# Patient Record
Sex: Male | Born: 1935 | Race: White | Hispanic: No | Marital: Married | Smoking: Former smoker
Health system: Southern US, Community
[De-identification: ages and names within clinical notes are randomized; demographics above are authoritative.]

## PROBLEM LIST (undated history)

## (undated) DIAGNOSIS — I10 Essential (primary) hypertension: Secondary | ICD-10-CM

## (undated) DIAGNOSIS — K635 Polyp of colon: Secondary | ICD-10-CM

## (undated) DIAGNOSIS — K449 Diaphragmatic hernia without obstruction or gangrene: Secondary | ICD-10-CM

## (undated) DIAGNOSIS — R0602 Shortness of breath: Secondary | ICD-10-CM

## (undated) DIAGNOSIS — N189 Chronic kidney disease, unspecified: Secondary | ICD-10-CM

## (undated) DIAGNOSIS — F32A Depression, unspecified: Secondary | ICD-10-CM

## (undated) DIAGNOSIS — D649 Anemia, unspecified: Secondary | ICD-10-CM

## (undated) DIAGNOSIS — F329 Major depressive disorder, single episode, unspecified: Secondary | ICD-10-CM

## (undated) DIAGNOSIS — I209 Angina pectoris, unspecified: Secondary | ICD-10-CM

## (undated) DIAGNOSIS — Z9989 Dependence on other enabling machines and devices: Secondary | ICD-10-CM

## (undated) DIAGNOSIS — I509 Heart failure, unspecified: Secondary | ICD-10-CM

## (undated) DIAGNOSIS — I4891 Unspecified atrial fibrillation: Secondary | ICD-10-CM

## (undated) DIAGNOSIS — J9611 Chronic respiratory failure with hypoxia: Secondary | ICD-10-CM

## (undated) DIAGNOSIS — M1990 Unspecified osteoarthritis, unspecified site: Secondary | ICD-10-CM

## (undated) DIAGNOSIS — M545 Low back pain, unspecified: Secondary | ICD-10-CM

## (undated) DIAGNOSIS — F0280 Dementia in other diseases classified elsewhere without behavioral disturbance: Secondary | ICD-10-CM

## (undated) DIAGNOSIS — I219 Acute myocardial infarction, unspecified: Secondary | ICD-10-CM

## (undated) DIAGNOSIS — R413 Other amnesia: Principal | ICD-10-CM

## (undated) DIAGNOSIS — K922 Gastrointestinal hemorrhage, unspecified: Secondary | ICD-10-CM

## (undated) DIAGNOSIS — K5790 Diverticulosis of intestine, part unspecified, without perforation or abscess without bleeding: Secondary | ICD-10-CM

## (undated) DIAGNOSIS — D126 Benign neoplasm of colon, unspecified: Secondary | ICD-10-CM

## (undated) DIAGNOSIS — I499 Cardiac arrhythmia, unspecified: Secondary | ICD-10-CM

## (undated) DIAGNOSIS — G4733 Obstructive sleep apnea (adult) (pediatric): Secondary | ICD-10-CM

## (undated) DIAGNOSIS — K649 Unspecified hemorrhoids: Secondary | ICD-10-CM

## (undated) DIAGNOSIS — K219 Gastro-esophageal reflux disease without esophagitis: Secondary | ICD-10-CM

## (undated) DIAGNOSIS — G309 Alzheimer's disease, unspecified: Secondary | ICD-10-CM

## (undated) DIAGNOSIS — I739 Peripheral vascular disease, unspecified: Secondary | ICD-10-CM

## (undated) DIAGNOSIS — K317 Polyp of stomach and duodenum: Secondary | ICD-10-CM

## (undated) DIAGNOSIS — J449 Chronic obstructive pulmonary disease, unspecified: Secondary | ICD-10-CM

## (undated) DIAGNOSIS — J42 Unspecified chronic bronchitis: Secondary | ICD-10-CM

## (undated) DIAGNOSIS — E119 Type 2 diabetes mellitus without complications: Secondary | ICD-10-CM

## (undated) DIAGNOSIS — N289 Disorder of kidney and ureter, unspecified: Secondary | ICD-10-CM

## (undated) DIAGNOSIS — C4490 Unspecified malignant neoplasm of skin, unspecified: Secondary | ICD-10-CM

## (undated) DIAGNOSIS — E669 Obesity, unspecified: Secondary | ICD-10-CM

## (undated) DIAGNOSIS — J45909 Unspecified asthma, uncomplicated: Secondary | ICD-10-CM

## (undated) DIAGNOSIS — J189 Pneumonia, unspecified organism: Secondary | ICD-10-CM

## (undated) DIAGNOSIS — K5520 Angiodysplasia of colon without hemorrhage: Secondary | ICD-10-CM

## (undated) DIAGNOSIS — E7800 Pure hypercholesterolemia, unspecified: Secondary | ICD-10-CM

## (undated) DIAGNOSIS — G8929 Other chronic pain: Secondary | ICD-10-CM

## (undated) DIAGNOSIS — R0989 Other specified symptoms and signs involving the circulatory and respiratory systems: Secondary | ICD-10-CM

## (undated) HISTORY — DX: Polyp of stomach and duodenum: K31.7

## (undated) HISTORY — DX: Chronic obstructive pulmonary disease, unspecified: J44.9

## (undated) HISTORY — DX: Dementia in other diseases classified elsewhere, unspecified severity, without behavioral disturbance, psychotic disturbance, mood disturbance, and anxiety: F02.80

## (undated) HISTORY — DX: Chronic respiratory failure with hypoxia: J96.11

## (undated) HISTORY — DX: Gastrointestinal hemorrhage, unspecified: K92.2

## (undated) HISTORY — DX: Other specified symptoms and signs involving the circulatory and respiratory systems: R09.89

## (undated) HISTORY — PX: CATARACT EXTRACTION W/ INTRAOCULAR LENS  IMPLANT, BILATERAL: SHX1307

## (undated) HISTORY — DX: Obesity, unspecified: E66.9

## (undated) HISTORY — DX: Diverticulosis of intestine, part unspecified, without perforation or abscess without bleeding: K57.90

## (undated) HISTORY — DX: Disorder of kidney and ureter, unspecified: N28.9

## (undated) HISTORY — DX: Alzheimer's disease, unspecified: G30.9

## (undated) HISTORY — PX: COLONOSCOPY W/ BIOPSIES AND POLYPECTOMY: SHX1376

## (undated) HISTORY — PX: LAPAROSCOPIC CHOLECYSTECTOMY: SUR755

## (undated) HISTORY — DX: Chronic kidney disease, unspecified: N18.9

## (undated) HISTORY — DX: Benign neoplasm of colon, unspecified: D12.6

## (undated) HISTORY — DX: Polyp of colon: K63.5

## (undated) HISTORY — DX: Other amnesia: R41.3

## (undated) HISTORY — DX: Unspecified hemorrhoids: K64.9

## (undated) HISTORY — DX: Gastro-esophageal reflux disease without esophagitis: K21.9

## (undated) HISTORY — PX: HAND SURGERY: SHX662

## (undated) HISTORY — DX: Unspecified atrial fibrillation: I48.91

## (undated) HISTORY — DX: Unspecified asthma, uncomplicated: J45.909

## (undated) HISTORY — DX: Diaphragmatic hernia without obstruction or gangrene: K44.9

---
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
## 1995-05-26 HISTORY — PX: CORONARY ARTERY BYPASS GRAFT: SHX141

## 1998-09-26 ENCOUNTER — Encounter: Payer: Self-pay | Admitting: Cardiology

## 1998-09-26 ENCOUNTER — Inpatient Hospital Stay (HOSPITAL_COMMUNITY): Admission: EM | Admit: 1998-09-26 | Discharge: 1998-09-28 | Payer: Self-pay | Admitting: Emergency Medicine

## 1998-09-27 ENCOUNTER — Encounter: Payer: Self-pay | Admitting: Cardiology

## 1998-10-08 ENCOUNTER — Other Ambulatory Visit: Admission: RE | Admit: 1998-10-08 | Discharge: 1998-10-08 | Payer: Self-pay | Admitting: Internal Medicine

## 1999-08-01 ENCOUNTER — Observation Stay (HOSPITAL_COMMUNITY): Admission: EM | Admit: 1999-08-01 | Discharge: 1999-08-02 | Payer: Self-pay | Admitting: Emergency Medicine

## 1999-08-01 ENCOUNTER — Encounter: Payer: Self-pay | Admitting: Cardiology

## 2002-07-24 ENCOUNTER — Emergency Department (HOSPITAL_COMMUNITY): Admission: EM | Admit: 2002-07-24 | Discharge: 2002-07-24 | Payer: Self-pay | Admitting: *Deleted

## 2002-07-24 ENCOUNTER — Encounter: Payer: Self-pay | Admitting: Emergency Medicine

## 2002-08-01 ENCOUNTER — Encounter: Admission: RE | Admit: 2002-08-01 | Discharge: 2002-10-30 | Payer: Self-pay

## 2002-08-08 ENCOUNTER — Encounter: Payer: Self-pay | Admitting: Neurosurgery

## 2002-08-08 ENCOUNTER — Ambulatory Visit (HOSPITAL_COMMUNITY): Admission: RE | Admit: 2002-08-08 | Discharge: 2002-08-08 | Payer: Self-pay | Admitting: Neurosurgery

## 2003-03-09 ENCOUNTER — Inpatient Hospital Stay (HOSPITAL_COMMUNITY): Admission: EM | Admit: 2003-03-09 | Discharge: 2003-03-14 | Payer: Self-pay | Admitting: Emergency Medicine

## 2003-07-11 ENCOUNTER — Emergency Department (HOSPITAL_COMMUNITY): Admission: AD | Admit: 2003-07-11 | Discharge: 2003-07-11 | Payer: Self-pay | Admitting: Emergency Medicine

## 2004-06-02 ENCOUNTER — Inpatient Hospital Stay (HOSPITAL_COMMUNITY): Admission: EM | Admit: 2004-06-02 | Discharge: 2004-06-06 | Payer: Self-pay | Admitting: Emergency Medicine

## 2005-11-24 ENCOUNTER — Ambulatory Visit: Payer: Self-pay | Admitting: Internal Medicine

## 2005-11-27 ENCOUNTER — Ambulatory Visit: Payer: Self-pay | Admitting: Gastroenterology

## 2005-12-07 ENCOUNTER — Encounter (INDEPENDENT_AMBULATORY_CARE_PROVIDER_SITE_OTHER): Payer: Self-pay | Admitting: Specialist

## 2005-12-07 ENCOUNTER — Ambulatory Visit: Payer: Self-pay | Admitting: Internal Medicine

## 2006-04-08 ENCOUNTER — Ambulatory Visit: Admission: RE | Admit: 2006-04-08 | Discharge: 2006-04-08 | Payer: Self-pay | Admitting: Family Medicine

## 2006-06-24 ENCOUNTER — Ambulatory Visit: Payer: Self-pay | Admitting: Internal Medicine

## 2006-07-20 ENCOUNTER — Observation Stay (HOSPITAL_COMMUNITY): Admission: EM | Admit: 2006-07-20 | Discharge: 2006-07-22 | Payer: Self-pay | Admitting: Emergency Medicine

## 2006-07-27 ENCOUNTER — Ambulatory Visit: Payer: Self-pay | Admitting: Internal Medicine

## 2006-11-19 ENCOUNTER — Encounter: Admission: RE | Admit: 2006-11-19 | Discharge: 2006-11-19 | Payer: Self-pay | Admitting: Family Medicine

## 2006-11-19 ENCOUNTER — Encounter (INDEPENDENT_AMBULATORY_CARE_PROVIDER_SITE_OTHER): Payer: Self-pay | Admitting: *Deleted

## 2006-12-30 ENCOUNTER — Inpatient Hospital Stay (HOSPITAL_COMMUNITY): Admission: EM | Admit: 2006-12-30 | Discharge: 2006-12-31 | Payer: Self-pay | Admitting: Emergency Medicine

## 2006-12-30 ENCOUNTER — Encounter (INDEPENDENT_AMBULATORY_CARE_PROVIDER_SITE_OTHER): Payer: Self-pay | Admitting: Internal Medicine

## 2006-12-30 ENCOUNTER — Ambulatory Visit: Payer: Self-pay | Admitting: Vascular Surgery

## 2007-08-20 ENCOUNTER — Inpatient Hospital Stay (HOSPITAL_COMMUNITY): Admission: EM | Admit: 2007-08-20 | Discharge: 2007-08-22 | Payer: Self-pay | Admitting: Emergency Medicine

## 2007-09-24 ENCOUNTER — Emergency Department (HOSPITAL_COMMUNITY): Admission: EM | Admit: 2007-09-24 | Discharge: 2007-09-24 | Payer: Self-pay | Admitting: Emergency Medicine

## 2008-01-12 ENCOUNTER — Encounter: Payer: Self-pay | Admitting: Internal Medicine

## 2008-05-05 ENCOUNTER — Inpatient Hospital Stay (HOSPITAL_COMMUNITY): Admission: EM | Admit: 2008-05-05 | Discharge: 2008-05-20 | Payer: Self-pay | Admitting: Emergency Medicine

## 2008-05-13 ENCOUNTER — Encounter (INDEPENDENT_AMBULATORY_CARE_PROVIDER_SITE_OTHER): Payer: Self-pay | Admitting: *Deleted

## 2008-05-16 ENCOUNTER — Ambulatory Visit: Payer: Self-pay | Admitting: Vascular Surgery

## 2008-05-16 ENCOUNTER — Ambulatory Visit: Payer: Self-pay | Admitting: Gastroenterology

## 2008-05-16 ENCOUNTER — Encounter (INDEPENDENT_AMBULATORY_CARE_PROVIDER_SITE_OTHER): Payer: Self-pay | Admitting: *Deleted

## 2008-05-16 ENCOUNTER — Encounter (INDEPENDENT_AMBULATORY_CARE_PROVIDER_SITE_OTHER): Payer: Self-pay | Admitting: Cardiology

## 2008-05-17 ENCOUNTER — Encounter (INDEPENDENT_AMBULATORY_CARE_PROVIDER_SITE_OTHER): Payer: Self-pay | Admitting: *Deleted

## 2008-05-17 ENCOUNTER — Encounter: Payer: Self-pay | Admitting: Gastroenterology

## 2008-05-18 ENCOUNTER — Encounter (INDEPENDENT_AMBULATORY_CARE_PROVIDER_SITE_OTHER): Payer: Self-pay | Admitting: Cardiology

## 2008-05-19 ENCOUNTER — Encounter: Payer: Self-pay | Admitting: Gastroenterology

## 2008-05-19 ENCOUNTER — Encounter (INDEPENDENT_AMBULATORY_CARE_PROVIDER_SITE_OTHER): Payer: Self-pay | Admitting: *Deleted

## 2008-05-20 ENCOUNTER — Encounter (INDEPENDENT_AMBULATORY_CARE_PROVIDER_SITE_OTHER): Payer: Self-pay | Admitting: *Deleted

## 2008-05-21 ENCOUNTER — Telehealth: Payer: Self-pay | Admitting: Internal Medicine

## 2008-05-22 ENCOUNTER — Telehealth: Payer: Self-pay | Admitting: Internal Medicine

## 2008-05-25 HISTORY — PX: RENAL ARTERY STENT: SHX2321

## 2008-05-28 ENCOUNTER — Encounter: Payer: Self-pay | Admitting: Nurse Practitioner

## 2008-05-31 ENCOUNTER — Ambulatory Visit: Payer: Self-pay | Admitting: Gastroenterology

## 2008-05-31 DIAGNOSIS — E119 Type 2 diabetes mellitus without complications: Secondary | ICD-10-CM | POA: Insufficient documentation

## 2008-05-31 DIAGNOSIS — K922 Gastrointestinal hemorrhage, unspecified: Secondary | ICD-10-CM | POA: Insufficient documentation

## 2008-05-31 DIAGNOSIS — K5731 Diverticulosis of large intestine without perforation or abscess with bleeding: Secondary | ICD-10-CM

## 2008-06-07 ENCOUNTER — Encounter: Payer: Self-pay | Admitting: Nurse Practitioner

## 2008-06-11 ENCOUNTER — Encounter: Payer: Self-pay | Admitting: Internal Medicine

## 2008-06-13 ENCOUNTER — Encounter: Payer: Self-pay | Admitting: Internal Medicine

## 2008-06-20 ENCOUNTER — Telehealth (INDEPENDENT_AMBULATORY_CARE_PROVIDER_SITE_OTHER): Payer: Self-pay | Admitting: *Deleted

## 2008-06-21 ENCOUNTER — Ambulatory Visit: Payer: Self-pay | Admitting: Internal Medicine

## 2008-06-21 DIAGNOSIS — R195 Other fecal abnormalities: Secondary | ICD-10-CM

## 2008-06-21 DIAGNOSIS — D649 Anemia, unspecified: Secondary | ICD-10-CM | POA: Insufficient documentation

## 2008-06-21 LAB — CONVERTED CEMR LAB
Ferritin: 60.6 ng/mL (ref 22.0–322.0)
HCT: 26.8 % — ABNORMAL LOW (ref 39.0–52.0)
Saturation Ratios: 8.2 % — ABNORMAL LOW (ref 20.0–50.0)
Transferrin: 259.8 mg/dL (ref >=212.0)

## 2008-06-22 ENCOUNTER — Telehealth: Payer: Self-pay | Admitting: Internal Medicine

## 2008-06-22 ENCOUNTER — Observation Stay (HOSPITAL_COMMUNITY): Admission: AD | Admit: 2008-06-22 | Discharge: 2008-06-22 | Payer: Self-pay | Admitting: Internal Medicine

## 2008-06-25 ENCOUNTER — Telehealth: Payer: Self-pay | Admitting: Nurse Practitioner

## 2008-06-25 ENCOUNTER — Telehealth (INDEPENDENT_AMBULATORY_CARE_PROVIDER_SITE_OTHER): Payer: Self-pay | Admitting: *Deleted

## 2008-07-02 ENCOUNTER — Encounter: Payer: Self-pay | Admitting: Nurse Practitioner

## 2008-07-04 ENCOUNTER — Telehealth: Payer: Self-pay | Admitting: Internal Medicine

## 2008-07-06 ENCOUNTER — Encounter (HOSPITAL_COMMUNITY): Admission: RE | Admit: 2008-07-06 | Discharge: 2008-10-04 | Payer: Self-pay | Admitting: Nephrology

## 2008-07-24 ENCOUNTER — Ambulatory Visit: Payer: Self-pay | Admitting: Internal Medicine

## 2008-08-05 ENCOUNTER — Inpatient Hospital Stay (HOSPITAL_COMMUNITY): Admission: EM | Admit: 2008-08-05 | Discharge: 2008-08-12 | Payer: Self-pay | Admitting: Emergency Medicine

## 2008-08-06 LAB — CONVERTED CEMR LAB
Lymphocytes Relative: 30.3 % (ref 12.0–46.0)
Monocytes Absolute: 0.6 10*3/uL (ref 0.1–1.0)
Monocytes Relative: 8.8 % (ref 3.0–12.0)
Platelets: 202 10*3/uL (ref 150–400)
RDW: 19.5 % — ABNORMAL HIGH (ref 11.5–14.6)

## 2008-08-10 ENCOUNTER — Encounter (INDEPENDENT_AMBULATORY_CARE_PROVIDER_SITE_OTHER): Payer: Self-pay | Admitting: Surgery

## 2008-11-29 ENCOUNTER — Encounter (INDEPENDENT_AMBULATORY_CARE_PROVIDER_SITE_OTHER): Payer: Self-pay | Admitting: *Deleted

## 2009-01-22 ENCOUNTER — Inpatient Hospital Stay (HOSPITAL_COMMUNITY): Admission: RE | Admit: 2009-01-22 | Discharge: 2009-01-23 | Payer: Self-pay | Admitting: Cardiovascular Disease

## 2009-03-11 ENCOUNTER — Ambulatory Visit (HOSPITAL_COMMUNITY): Admission: RE | Admit: 2009-03-11 | Discharge: 2009-03-12 | Payer: Self-pay | Admitting: Cardiovascular Disease

## 2009-04-22 ENCOUNTER — Ambulatory Visit (HOSPITAL_COMMUNITY): Admission: RE | Admit: 2009-04-22 | Discharge: 2009-04-22 | Payer: Self-pay | Admitting: Cardiovascular Disease

## 2009-07-01 ENCOUNTER — Telehealth: Payer: Self-pay | Admitting: Internal Medicine

## 2009-09-04 ENCOUNTER — Inpatient Hospital Stay (HOSPITAL_COMMUNITY): Admission: EM | Admit: 2009-09-04 | Discharge: 2009-09-07 | Payer: Self-pay | Admitting: Emergency Medicine

## 2010-01-09 ENCOUNTER — Ambulatory Visit: Payer: Self-pay | Admitting: Pulmonary Disease

## 2010-01-09 ENCOUNTER — Inpatient Hospital Stay (HOSPITAL_COMMUNITY): Admission: EM | Admit: 2010-01-09 | Discharge: 2010-01-14 | Payer: Self-pay | Admitting: Emergency Medicine

## 2010-01-10 ENCOUNTER — Encounter (INDEPENDENT_AMBULATORY_CARE_PROVIDER_SITE_OTHER): Payer: Self-pay | Admitting: Pulmonary Disease

## 2010-01-14 ENCOUNTER — Telehealth: Payer: Self-pay | Admitting: Internal Medicine

## 2010-01-23 ENCOUNTER — Ambulatory Visit: Payer: Self-pay | Admitting: Internal Medicine

## 2010-01-23 DIAGNOSIS — G4733 Obstructive sleep apnea (adult) (pediatric): Secondary | ICD-10-CM

## 2010-02-02 ENCOUNTER — Encounter: Payer: Self-pay | Admitting: Internal Medicine

## 2010-02-11 ENCOUNTER — Ambulatory Visit: Payer: Self-pay | Admitting: Internal Medicine

## 2010-02-12 ENCOUNTER — Telehealth: Payer: Self-pay | Admitting: Internal Medicine

## 2010-03-05 ENCOUNTER — Inpatient Hospital Stay (HOSPITAL_COMMUNITY): Admission: EM | Admit: 2010-03-05 | Discharge: 2010-03-08 | Payer: Self-pay | Admitting: Emergency Medicine

## 2010-03-06 ENCOUNTER — Encounter (INDEPENDENT_AMBULATORY_CARE_PROVIDER_SITE_OTHER): Payer: Self-pay | Admitting: Internal Medicine

## 2010-03-26 ENCOUNTER — Telehealth: Payer: Self-pay | Admitting: Internal Medicine

## 2010-05-25 DIAGNOSIS — K317 Polyp of stomach and duodenum: Secondary | ICD-10-CM

## 2010-05-25 HISTORY — DX: Polyp of stomach and duodenum: K31.7

## 2010-06-03 ENCOUNTER — Telehealth (INDEPENDENT_AMBULATORY_CARE_PROVIDER_SITE_OTHER): Payer: Self-pay | Admitting: *Deleted

## 2010-06-05 ENCOUNTER — Ambulatory Visit
Admission: RE | Admit: 2010-06-05 | Discharge: 2010-06-05 | Payer: Self-pay | Source: Home / Self Care | Attending: Internal Medicine | Admitting: Internal Medicine

## 2010-06-06 ENCOUNTER — Ambulatory Visit: Payer: Self-pay | Admitting: Cardiology

## 2010-06-06 ENCOUNTER — Telehealth: Payer: Self-pay | Admitting: Internal Medicine

## 2010-06-09 ENCOUNTER — Telehealth (INDEPENDENT_AMBULATORY_CARE_PROVIDER_SITE_OTHER): Payer: Self-pay | Admitting: *Deleted

## 2010-06-15 ENCOUNTER — Encounter: Payer: Self-pay | Admitting: Cardiology

## 2010-06-26 NOTE — Progress Notes (Signed)
Summary: nos appt  Phone Note Call from Patient   Caller: [email or web address]  Call For: young Summary of Call: In ref to nos from 11/1, pt states he will call to rsc. Initial call taken by: Darletta Moll,  March 26, 2010 10:04 AM

## 2010-06-26 NOTE — Assessment & Plan Note (Signed)
Summary: HFU-RESP FAILURE PER PETE///kp   Primary Provider/Referring Provider:  [name], MD  CC:  [facility]  History of Present Illness: DATE:07/27/2006                         1. Asthmatic bronchitis. 2. Exogenous obesity. 3. Esophageal reflux. 4. Coronary disease/bypass graft. 5. Hypertension. 6. Diabetes. 7. Alzheimer's.  HISTORY:  He was in the hospital at the end of February to rule out MI with enzymes negative and a negative stress test.  Chest pain was thought to be related to reflux and omeprazole was increased to b.i.d. His wife had called in mid February complaining of thrush and saying Advair was not helping.  We had substituted Spiriva and given a single dose of Diflucan at that time.  He ran out of the Spiriva but felt that definitely helped.  Throat has been better.  Chest x-ray report from the hospital on July 01, 2006, described mild cardiomegaly without acute disease.  Lungs were clear.  Postoperative sternotomy changes were seen. Blood work was significant for a BUN of 41 and a creatinine of 2.17, indicating renal insufficiency in the context of atherosclerosis, hypertension, and diabetes.    January 23, 2010- Chronic resp failure, COPD, Renal insuf, .Marland Kitchen...wife here, OSA Recent hosp 8/18-23/11,[facility] Acute/ Chronic resp failure. DC'd on Bipap auto but needs settings/ documentation. Was using CPAP/ SMS at home. Was discharged on BIPAP - comfortable in hosp, but set too high at home, with O2 continuous at 3 L. Home O2 sat 96 at rest on room air. 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 Feet swelling hurts. Dr Tresa Endo dxd OSA w/ NPSG in April 2011 at [facility]. He didn't see this pt at last hosp stay. ABG at hosp on 50%- pH 7.28, PCO2 58.5, PO2 178 on 12/1809.    Preventive Screening-Counseling & Management  Alcohol-Tobacco     Smoking Status: quit     Year Quit: 1992     Pack years: 50 years 2ppd     Tobacco  Counseling: not to resume use of tobacco products  Current Medications (verified): 1)  Symbicort 160-4.5 Mcg/act Aero (Budesonide-Formoterol Fumarate) .... 2 Puffs Two Times A Day 2)  Colchicine 0.6 Mg Tabs (Colchicine) .... Every Other Day 3)  Protonix 40 Mg Tbec (Pantoprazole Sodium) .... Once Daily 4)  Zetia 10 Mg Tabs (Ezetimibe) .... Once Daily 5)  Folic Acid 400 Mcg Tabs (Folic Acid) .... Once Daily 6)  Multivitamins  Tabs (Multiple Vitamin) .... Once Daily 7)  Lexapro 10 Mg Tabs (Escitalopram Oxalate) .... Once Daily 8)  Glipizide 5 Mg Tabs (Glipizide) .... Take 2 Tabs in Am 9)  Lipitor 40 Mg Tabs (Atorvastatin Calcium) .... At Bedtime 10)  Gabapentin 100 Mg Caps (Gabapentin) .... At Bedtime 11)  Furosemide 80 Mg Tabs (Furosemide) .... Take 2 By Mouth Two Times A Day 12)  Tylenol Ex St Arthritis Pain 500 Mg Tabs (Acetaminophen) .... Take 2 Tablets By Mouth Two Times A Day 13)  Ferrous Sulfate 325 (65 Fe) Mg  Tabs (Ferrous Sulfate) .... Take 1 Tablet By Mouth Three Times A Day 14)  Prednisone 5 Mg Tabs (Prednisone) .... Take 1 By Mouth Once Daily 15)  Nitrostat 0.4 Mg Subl (Nitroglycerin) .... As Needed 16)  Niacor 500 Mg Tabs (Niacin) .... Take 1 By Mouth Two Times A Day 17)  Lantus 100 Unit/ml Soln (Insulin Glargine) .... Inject 20 Units At  Bedtime 18)  Amlodipine Besylate 10 Mg Tabs (Amlodipine Besylate) .... Take 1 By Mouth Once Daily 19)  Aspirin 325 Mg Tabs (Aspirin) .... Take 1 By Mouth Once Daily 20)  Albuterol Sulfate (2.5 Mg/51ml) 0.083% Nebu (Albuterol Sulfate) .Marland Kitchen.. 1 Vial Four Times A Day 21)  Bipap .... Auto Set Sms 22)  Oxygen .Marland Kitchen.. 3l/m 24 Hours  Allergies: 1)  ! Sulfa 2)  ! Heparin 3)  ! * Ertapenem 4)  ! Morphine 5)  ! * Hydromorphone 6)  ! * Metoprolol 7)  ! * Donepezil 8)  ! * Hydralazine 9)  ! * Clopidogrel 10)  ! * Cefuroxime  Past History:  Past Surgical History: Last updated: 05/31/2008 CABG Hand surgery  Family History: Last updated:  05/31/2008 Melenoma: Mother Lung Cancer: Brother Family History of Diabetes:  Family History of Heart Disease:  Family History of Stomach Cancer:GF  Social History: Last updated: 05/31/2008 Occupation: Retired Patient is a former smoker.  Alcohol Use - no Daily Caffeine Use Illicit Drug Use - no  Risk Factors: Smoking Status: quit (01/23/2010)  Past Medical History: Adenomatous Colon Polyps Diverticulosis GERD Hiatal hernia Alzheimers Gout Renal insuffiency Diabetes Asthma/Bronchitis COPD Chronic hypoxic respiratory failure obstructive sleep apnea  Social History: Pack years:  50 years 2ppd  Review of Systems      See HPI       The patient complains of irregular heartbeats and hand/feet swelling.  The patient denies shortness of breath with activity, shortness of breath at rest, productive cough, non-productive cough, coughing up blood, chest pain, acid heartburn, indigestion, loss of appetite, weight change, abdominal pain, difficulty swallowing, sore throat, tooth/dental problems, headaches, nasal congestion/difficulty breathing through nose, sneezing, rash, change in color of mucus, and fever.    Vital Signs:  Patient profile:   77 year old male Height:      69 inches Weight:      254.25 pounds BMI:     37.68 O2 Sat:      97 % on Room air Pulse rate:   62 / minute BP sitting:   122 / 68  (left arm) Cuff size:   regular  Vitals Entered By: Reynaldo Minium CMA (January 23, 2010 1:33 PM)  O2 Flow:  Room air CC: Conservation officer, historic buildings   Physical Exam  Additional Exam:  General: A/Ox3; pleasant and cooperative, NAD, alert and talkative, wheelchair. Supplemental O2 continuous- 97% on 1 L here at rest SKIN: no rash, lesions NODES: no lymphadenopathy HEENT: [place]/AT, EOM- WNL, Conjuctivae- clear, PERRLA, TM-WNL, Nose- clear, Throat- clear and wnl, Mallampati  III NECK: Supple w/ fair ROM, JVD- none obvious (thick neck), normal carotid impulses w/o bruits Thyroid- normal  to palpation CHEST: Clear to P&A, diminshed. No cough, dullness or rales/ wheeze. HEART: RRR, no m/g/r heard ABDOMEN: significantly overweight EAV:WUJW, nl pulses,  2-3+edema feet. synovial thickening on fingers NEURO: Grossly intact to observation      Impression & Recommendations:  Problem # 1:  OBSTRUCTIVE SLEEP APNEA (ICD-327.23)  Obstructive sleep apnea was Dx'd by Dr Tresa Endo and they are pending f/u with him. Current BiPAP auto set up at hosp discharge is ranging too high, but CPAP was well tolerated til he got sick. I will set him up with CPAP autotitration for pressure recheck, with results and further decisions from Dr Tresa Endo unless he wants me to manage this problem. Weight loss would help. Patient had been compliant and well controlled before decompensating acutely.  Problem # 2:  ACUTE AND CHRONIC  RESPIRATORY FAILURE (ICD-518.84)  He is describing good room air sats at rest at home, and may not need the 3 liters he is now assigned. Has been on O2 through [facility] for past 1.5 years. I can help with this. We will continue oxygen at night and check room air sats at home for goal 90-94%  Medications Added to Medication List This Visit: 1)  Symbicort 160-4.5 Mcg/act Aero (Budesonide-formoterol fumarate) .... 2 puffs two times a day 2)  Furosemide 80 Mg Tabs (Furosemide) .... Take 2 by mouth two times a day 3)  Prednisone 5 Mg Tabs (Prednisone) .... Take 1 by mouth once daily 4)  Nitrostat 0.4 Mg Subl (Nitroglycerin) .... As needed 5)  Niacor 500 Mg Tabs (Niacin) .... Take 1 by mouth two times a day 6)  Lantus 100 Unit/ml Soln (Insulin glargine) .... Inject 20 units at bedtime 7)  Amlodipine Besylate 10 Mg Tabs (Amlodipine besylate) .... Take 1 by mouth once daily 8)  Aspirin 325 Mg Tabs (Aspirin) .... Take 1 by mouth once daily 9)  Albuterol Sulfate (2.5 Mg/68ml) 0.083% Nebu (Albuterol sulfate) .Marland Kitchen.. 1 vial four times a day 10)  Bipap  .... Auto set sms 11)  Oxygen  .Marland Kitchen.. 3l/m 24  hours 12)  Oxygen [facility]  .Marland Kitchen.. 2l/m 24 hours  goal sat 90-94%  Other Orders: Est. Patient Level IV (16109) Admin 1st Vaccine (60454) Flu Vaccine 22yrs + (09811) DME Referral (DME)  Patient Instructions: 1)  Please schedule a follow-up appointment in 2 months. 2)  CC Dr Tresa Endo 3)  Patsy Lager discuss a demand regulator for your portable oxygen 4)  I suggest we set it at 2 for now, but adjust it during the day as needed to hold your oxygen saturation begtween 90-94%. 5)  We will have SMS change your machine back to CPAP and will autotitrate it for a week to get a pressure record. They will send that to Dr Tresa Endo and he will decide how to manage your CPAP. 6)  Flu vax   Flu Vaccine Consent Questions     Do you have a history of severe allergic reactions to this vaccine? no    Any prior history of allergic reactions to egg and/or gelatin? no    Do you have a sensitivity to the preservative Thimersol? no    Do you have a past history of Guillan-Barre Syndrome? no    Do you currently have an acute febrile illness? no    Have you ever had a severe reaction to latex? no    Vaccine information given and explained to patient? yes    Are you currently pregnant? no    Lot Number:[id number]   Exp Date:11/22/2010   Site Given  Left Deltoid IMlbflu   Carver Fila  January 23, 2010 2:47 PM

## 2010-06-26 NOTE — Progress Notes (Signed)
Summary: Autotitrated to CPAP 13  Phone Note Other Incoming   Summary of Call: PAP autotitration SMS. Good control AHI 0.8 onCPAP 13. Will change to CPAP 13 for resp failure.    New/Updated Medications: * CPAP 13 SMS w/ O2 2 L/M

## 2010-06-26 NOTE — Miscellaneous (Signed)
Summary: Face to Face Encounter / [facility]  Face to Face Encounter / [facility]   Imported By: Lennie Odor 02/17/2010 12:26:38  _____________________________________________________________________  External Attachment:    Type:   Image     Comment:   External Document

## 2010-06-26 NOTE — Progress Notes (Signed)
Summary: bipap  Phone Note Call from Patient   Caller: [email or web address]  Call For: young Summary of Call: pete our NP from the hosp called her to get bipap set up for this pt for chronic resp failure she needs abg,documentation failure and a dx code she also needs settings for the bipap she will go ahead and set the pt up on bipap auto per pete's request but does need all the qualifying thigs for insurance  Initial call taken by: Oneita Jolly,  January 14, 2010 5:02 PM

## 2010-06-26 NOTE — Progress Notes (Signed)
Summary: pre-cert for CT  Phone Note Call from Patient   Caller: spouse/[name] Call For: Dr. Maple Hudson Summary of Call: patient phoned and stated that her husband Starsky is scheduled for a CT she just wants to know if this needs prior auth and if so did we obatian that. She can be reached at [phone] you can leave a message if she doesnt answer Initial call taken by: Vedia Coffer,  June 06, 2010 10:21 AM  Follow-up for Phone Call        I advsied this has been done when the scan was scheduled. Carron Curie CMA  June 06, 2010 10:26 AM

## 2010-06-26 NOTE — Miscellaneous (Signed)
Summary: Certification & Plan of Care / [facility]  Certification & Plan of Care / [facility]   Imported By: Lennie Odor 02/17/2010 13:48:12  _____________________________________________________________________  External Attachment:    Type:   Image     Comment:   External Document

## 2010-06-26 NOTE — Assessment & Plan Note (Signed)
Summary: per pt/katie/mhh   Primary Provider/Referring Provider:  [name], MD  CC:  Acute visit-cough x 7 weeks productive green and yellow in color., SOB with activity, and wheezing.Marland Kitchen  History of Present Illness:  January 23, 2010- Chronic resp failure, COPD, Renal insuf, .Marland Kitchen...wife here, OSA Recent hosp 8/18-23/11,[facility] Acute/ Chronic resp failure. DC'd on Bipap auto but needs settings/ documentation. Was using CPAP/ SMS at home. Was discharged on BIPAP - comfortable in hosp, but set too high at home, with O2 continuous at 3 L. Home O2 sat 96 at rest on room air. 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 Feet swelling hurts. Dr Tresa Endo dxd OSA w/ NPSG in April 2011 at [facility]. He didn't see this pt at last hosp stay. ABG at hosp on 50%- pH 7.28, PCO2 58.5, PO2 178 on 12/1809.  June 05, 2010- Chronic resp failure, COPD, Renal insuf, OSA.... daughter here Nurse-CC: Acute visit-cough x 7 weeks productive green and yellow in color., SOB with activity, wheezing. CPAP adjusted to 13 w/ good compliance and control. Complains now of variable cough, green/ yellow, wheeze, some chill and sweat x 7 weeks. Dr Jeannetta Nap did  CXR- told CE, no penumonia. Wears CPAP with O2 only for sleep at 3 L/M. Note he is on lisinopril. Family says he has had xrays and they ask about CT chest "somethings going on". Most of cough is dry.       Preventive Screening-Counseling & Management  Alcohol-Tobacco     Smoking Status: quit     Packs/Day: 2.5     Year Started: 77 years old     Year Quit: 1990  Current Medications (verified): 1)  Symbicort 160-4.5 Mcg/act Aero (Budesonide-Formoterol Fumarate) .... 2 Puffs Two Times A Day 2)  Colcrys 0.6 Mg Tabs (Colchicine) .... Take 1 By Mouth Two Times A Day 3)  Protonix 40 Mg Tbec (Pantoprazole Sodium) .... Once Daily 4)  Zetia 10 Mg Tabs (Ezetimibe) .... Once Daily 5)  Folic Acid 400 Mcg Tabs (Folic Acid) .... Once Daily 6)   Multivitamins  Tabs (Multiple Vitamin) .... Take 1 By Mouth Two Times A Day 7)  Lexapro 10 Mg Tabs (Escitalopram Oxalate) .... Once Daily 8)  Glipizide 5 Mg Tabs (Glipizide) .... Take 2 Tabs in Am 9)  Gabapentin 100 Mg Caps (Gabapentin) .... At Bedtime 10)  Furosemide 80 Mg Tabs (Furosemide) .... Take 2 By Mouth Two Times A Day 11)  Tylenol Ex St Arthritis Pain 500 Mg Tabs (Acetaminophen) .... Take 2 Tablets By Mouth Two Times A Day 12)  Prednisone 5 Mg Tabs (Prednisone) .... Take 1 By Mouth Once Daily 13)  Nitrostat 0.4 Mg Subl (Nitroglycerin) .... As Needed 14)  Niacor 500 Mg Tabs (Niacin) .... Take 1 By Mouth Two Times A Day 15)  Lantus 100 Unit/ml Soln (Insulin Glargine) .... Inject 20 Units At Bedtime 16)  Amlodipine Besylate 10 Mg Tabs (Amlodipine Besylate) .... Take 1 By Mouth Once Daily 17)  Aspirin 325 Mg Tabs (Aspirin) .... Take 1 By Mouth Once Daily 18)  Albuterol Sulfate (2.5 Mg/44ml) 0.083% Nebu (Albuterol Sulfate) .Marland Kitchen.. 1 Vial Four Times A Day 19)  Cpap 13 Sms .... W/ O2 2 L/m 20)  Oxygen [facility] .Marland Kitchen.. 2l/m 24 Hours  Goal Sat 90-94% 21)  Isosorbide Mononitrate Cr 30 Mg Xr24h-Tab (Isosorbide Mononitrate) .... Take 1 By Mouth Once Daily 22)  Lisinopril 10 Mg Tabs (Lisinopril) .... Take 1/2 By Mouth Once Daily 23)  Metolazone  2.5 Mg Tabs (Metolazone) .... Take 1 By Mouth Every Other Day 24)  Cinnamon 500 Mg Tabs (Cinnamon) .... Take 2 By Mouth Once Daily  Allergies (verified): 1)  ! Sulfa 2)  ! Heparin 3)  ! * Ertapenem 4)  ! Morphine 5)  ! * Hydromorphone 6)  ! * Metoprolol 7)  ! * Donepezil 8)  ! * Hydralazine 9)  ! * Clopidogrel 10)  ! * Cefuroxime  Past History:  Past Medical History: Last updated: 01/23/2010 Adenomatous Colon Polyps Diverticulosis GERD Hiatal hernia Alzheimers Gout Renal insuffiency Diabetes Asthma/Bronchitis COPD Chronic hypoxic respiratory failure obstructive sleep apnea  Past Surgical History: Last updated: 05/31/2008 CABG Hand  surgery  Family History: Last updated: 05/31/2008 Melenoma: Mother Lung Cancer: Brother Family History of Diabetes:  Family History of Heart Disease:  Family History of Stomach Cancer:GF  Social History: Last updated: 05/31/2008 Occupation: Retired Patient is a former smoker.  Alcohol Use - no Daily Caffeine Use Illicit Drug Use - no  Risk Factors: Smoking Status: quit (06/05/2010) Packs/Day: 2.5 (06/05/2010)  Social History: Packs/Day:  2.5  Review of Systems      See HPI       The patient complains of shortness of breath with activity and productive cough.  The patient denies shortness of breath at rest, non-productive cough, coughing up blood, chest pain, irregular heartbeats, acid heartburn, indigestion, loss of appetite, weight change, abdominal pain, difficulty swallowing, sore throat, tooth/dental problems, headaches, nasal congestion/difficulty breathing through nose, sneezing, itching, hand/feet swelling, joint stiffness or pain, rash, change in color of mucus, and fever.    Vital Signs:  Patient profile:   77 year old male Height:      69 inches Weight:      255.25 pounds BMI:     37.83 O2 Sat:      92 % on Room air Pulse rate:   59 / minute BP sitting:   138 / 60  (left arm) Cuff size:   large  Vitals Entered By: Reynaldo Minium CMA (June 05, 2010 1:25 PM)  O2 Flow:  Room air  Physical Exam  Additional Exam:  General: A/Ox3; pleasant and cooperative, NAD, alert and talkative, wheelchair. Supplemental O2 continuous- 92% room air at rest SKIN: no rash, lesions NODES: no lymphadenopathy HEENT: [place]/AT, EOM- WNL, Conjuctivae- clear, PERRLA, TM-WNL, Nose- clear, Throat- clear and wnl, Mallampati  III NECK: Supple w/ fair ROM, JVD- none obvious (thick neck), normal carotid impulses w/o bruits Thyroid- normal to palpation CHEST: Clear to P&A, diminshed. trace wheeze HEART: RRR, no m/g/r heard ABDOMEN: significantly overweight ZOX:WRUE, nl pulses,  2 + edema  feet. synovial thickening on fingers NEURO: Grossly intact to observation      Impression & Recommendations:  Problem # 1:  OBSTRUCTIVE SLEEP APNEA (ICD-327.23)  Good compliance and control with CPAP plus O2. We don't need to make changes now but reviewed current status and purpose of CPAP therapy.  Problem # 2:  ACUTE AND CHRONIC RESPIRATORY FAILURE (ICD-518.84)  Chronic bronchitis with exacerbation. High potential for fluid overload with heart and renal problems.  Very heavy smoking hx. Concerned about steroids and his diabetes. Spiriva wasn't much help. We will try increased inhaled steroid using Dulera 200-5. I will try to better define his lung status with CT.  Medications Added to Medication List This Visit: 1)  Colcrys 0.6 Mg Tabs (Colchicine) .... Take 1 by mouth two times a day 2)  Multivitamins Tabs (Multiple vitamin) .... Take 1 by mouth two  times a day 3)  Oxygen [facility]  .... L/m 24 hours  goal sat 90-94% 4)  Isosorbide Mononitrate Cr 30 Mg Xr24h-tab (Isosorbide mononitrate) .... Take 1 by mouth once daily 5)  Lisinopril 10 Mg Tabs (Lisinopril) .... Take 1/2 by mouth once daily 6)  Metolazone 2.5 Mg Tabs (Metolazone) .... Take 1 by mouth every other day 7)  Cinnamon 500 Mg Tabs (Cinnamon) .... Take 2 by mouth once daily  Other Orders: Est. Patient Level IV (95284) Radiology Referral (Radiology)  Patient Instructions: 1)  Please schedule a follow-up appointment in 1 month. 2)  A Chest CT WITHOUT Contrast has been recommended. Your imaging study may require preauthorization.  3)  Sample Dulera 200-5, 2 puffs and rinse mouth, twice daily. Use up this sample instead of Symbicort. When finished then return to Symbicort.  4)  cc Dr Jeannetta Nap 5)  ...................................................................................... 6)  CT CHEST W/O CM - [id number] 7)    8)  Clinical Data: Respiratory failure.  Bronchitis. 9)    10)  CT CHEST WITHOUT CONTRAST 11)    12)   Technique:  Multidetector CT imaging of the chest was performed 13)  following the standard protocol without IV contrast. 14)    15)  Comparison: 03/05/2010 16)    17)  Findings: The coronary artery and aortic atherosclerotic 18)  calcification is present.  There is likely some calcification along 19)  the inferior pericardium. 20)    21)  No thoracic adenopathy identified. 22)    23)  Peripheral reticulonodular interstitial opacities are present in 24)  the right upper lobe, some of which have a tree in bud appearance 25)  suspicious for atypical infectious bronchiolitis.  There is mild 26)  scarring and volume loss in the right lower lobe, with a 4 mm right 27)  lower lobe nodule identified on image 36 of series 3. 28)    29)  Paraseptal emphysema noted. 30)    31)  Thoracic spondylosis is present. 32)    33)  Mild prominence of the epicardial adipose tissue is probably 34)  incidental. 35)    36)  IMPRESSION: 37)    38)  1.  Reticulonodular peripheral interstitial opacities in the right 39)  upper lobe, some of which of tree in bud appearance, suspicious for 40)  atypical infectious bronchiolitis. 41)  2.  Paraseptal emphysema. 42)  3.  Chronic  mild peripheral fibrosis in the lungs. 43)  4.  4 mm nodule in the right lower lobe.  This is retrospectively 85)  stable back through 06/02/2004 and considered benign. 45)  5.  Coronary artery and aortic atherosclerosis. 46)  6.  Low calcifications along the anterior pericardium, stable from 47)  2006. 48)    49)  Read By:  Dellia Cloud,  M.D.     50)  Released By:  Dellia Cloud,  Judie Petit.D.

## 2010-06-26 NOTE — Progress Notes (Signed)
Summary: needs morning appt asap//  [facility]  Phone Note Call from Patient Call back at Home Phone [phone]   Caller: Spouse mrs Cavalieri Call For: young Summary of Call: pt coughs until he is choking. spouse says his lung infection hasn't cleared up. needs a morning appt asap. ok to leave msg on home ph# as spouse is going to work now.  Initial call taken by: Tivis Ringer, CNA,  June 03, 2010 9:34 AM  Follow-up for Phone Call        PT c/o cough - occas prod(green), SOB with sats running 96 - 98%, wheezing, sinus drainage and congestion.  Denies fever.  Pt is taking Amoxcillin 500mg  tow tab every 8 hrs per Dr Jeannetta Nap and has been on this off and on for 5-6 weeks.  Please advise. Abigail Miyamoto RN  June 03, 2010 10:01 AM   Per Florentina Addison offer appt for 8:45 in am 06/04/10; called pt; got ans machine LMTC to call .Kandice Hams [facility]  June 03, 2010 2:17 PM   Additional Follow-up for Phone Call Additional follow up Details #1::        Pt retruned call to our office; pt will be seen 115pm on Thursday 06-05-10.Reynaldo Minium CMA  June 04, 2010 10:16 AM

## 2010-06-26 NOTE — Progress Notes (Signed)
Summary: Schedule Colonoscopy  Phone Note Outgoing Call   Call placed by: Hortense Ramal CMA Duncan Dull),  July 01, 2009 10:21 AM Call placed to: Patient Summary of Call: Patient needs to have a recall colonoscopy due to history of adenomatous colonic polyps and diverticulosis.   Follow-up for Phone Call        Patient states that he will have "[name]" call back later to schedule colonoscopy. Follow-up by: Hortense Ramal CMA Duncan Dull),  July 01, 2009 10:25 AM     Appended Document: Schedule Colonoscopy ACTUALLY HAD COLONOSCOPY IN HOSPITAL 12-09. HE DOES NOT NEED ROUTINE FOLLOW UP  Appended Document: Schedule Colonoscopy Patient advised.

## 2010-06-26 NOTE — Progress Notes (Signed)
Summary: results/returning call-  Phone Note Call from Patient Call back at Home Phone [phone]   Caller: Spouse--janie Call For: young Reason for Call: Talk to Nurse, Lab or Test Results Summary of Call: Patient's wife returning Katie's call about CT results from Friday. Initial call taken by: Lehman Prom,  June 09, 2010 1:19 PM  Follow-up for Phone Call        [facility] Vernie Murders  June 09, 2010 3:19 PM   Additional Follow-up for Phone Call Additional follow up Details #1::        Spoke with pt and his daughter and notified of results per CDY append to CT chest.  They verbalized understanding. Additional Follow-up by: Vernie Murders,  June 09, 2010 3:36 PM

## 2010-07-07 ENCOUNTER — Encounter (INDEPENDENT_AMBULATORY_CARE_PROVIDER_SITE_OTHER): Payer: Self-pay | Admitting: *Deleted

## 2010-07-07 ENCOUNTER — Ambulatory Visit (INDEPENDENT_AMBULATORY_CARE_PROVIDER_SITE_OTHER): Payer: BC Managed Care – PPO | Admitting: Internal Medicine

## 2010-07-07 ENCOUNTER — Encounter: Payer: Self-pay | Admitting: Internal Medicine

## 2010-07-07 ENCOUNTER — Other Ambulatory Visit: Payer: BC Managed Care – PPO

## 2010-07-07 DIAGNOSIS — I2510 Atherosclerotic heart disease of native coronary artery without angina pectoris: Secondary | ICD-10-CM

## 2010-07-07 DIAGNOSIS — J42 Unspecified chronic bronchitis: Secondary | ICD-10-CM

## 2010-07-07 DIAGNOSIS — G4733 Obstructive sleep apnea (adult) (pediatric): Secondary | ICD-10-CM

## 2010-07-07 DIAGNOSIS — Z111 Encounter for screening for respiratory tuberculosis: Secondary | ICD-10-CM

## 2010-07-10 ENCOUNTER — Other Ambulatory Visit: Payer: Self-pay | Admitting: Internal Medicine

## 2010-07-11 ENCOUNTER — Encounter: Payer: Self-pay | Admitting: Internal Medicine

## 2010-07-16 NOTE — Assessment & Plan Note (Signed)
Summary: 1 month rov/kp   Primary Provider/Referring Provider:  [name], MD  CC:  1 month follow up.  Pt states breathing and cough have improved.  Still having coughing spells at times.  Occas prod with yellow mucus..  History of Present Illness:  January 23, 2010- Chronic resp failure, COPD, Renal insuf, .Marland Kitchen...wife here, OSA Recent hosp 8/18-23/11,[facility]/ Chronic resp failure. DC'd on Bipap auto but needs settings/ documentation. Was using CPAP/ SMS at home. Was discharged on BIPAP - comfortable in hosp, but set too high at home, with O2 continuous at 3 L. Home O2 sat 96 at rest on room air. 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 Feet swelling hurts. Dr Tresa Endo dxd OSA w/ NPSG in April 2011 at [facility]. He didn't see this pt at last hosp stay. ABG at hosp on 50%- pH 7.28, PCO2 58.5, PO2 178 on 12/1809.  June 05, 2010- Chronic resp failure, COPD, Renal insuf, OSA.... daughter here Nurse-CC: Acute visit-cough x 7 weeks productive green and yellow in color., SOB with activity, wheezing. CPAP adjusted to 13 w/ good compliance and control. Complains now of variable cough, green/ yellow, wheeze, some chill and sweat x 7 weeks. Dr Jeannetta Nap did  CXR- told CE, no penumonia. Wears CPAP with O2 only for sleep at 3 L/M. Note he is on lisinopril. Family says he has had xrays and they ask about CT chest "somethings going on". Most of cough is dry.   July 07, 2010- Chronic resp failure, COPD, Renal insuf, OSA.... daughter here Nurse-CC: 1 month follow up.  Pt states breathing and cough have improved.  Still having coughing spells at times.  Occas prod with yellow mucus. CT chest 06/06/10- ASCVD, bronchitis c/w [facility] Feeling better, with les cough. Still occasional cough with yellow brown. He likes Dulera 200-5 sample and asks 3 month script. Denies night sweats fever or blood. Denies chest pain. Does get palpitions. Sees Dr Allyson Sabal at [facility]  OSA- good with CPAP  plus O2. used all night, getting enough sleep, and not told of snore or apneas.    Preventive Screening-Counseling & Management  Alcohol-Tobacco     Smoking Status: quit     Packs/Day: 2.5     Year Started: 77 years old     Year Quit: 1990     Pack years: 50 years 2ppd     Tobacco Counseling: not to resume use of tobacco products  Current Medications (verified): 1)  Symbicort 160-4.5 Mcg/act Aero (Budesonide-Formoterol Fumarate) .... 2 Puffs Two Times A Day 2)  Colcrys 0.6 Mg Tabs (Colchicine) .... Take 1 By Mouth Two Times A Day 3)  Protonix 40 Mg Tbec (Pantoprazole Sodium) .... Once Daily 4)  Zetia 10 Mg Tabs (Ezetimibe) .... Once Daily 5)  Folic Acid 400 Mcg Tabs (Folic Acid) .... Once Daily 6)  Multivitamins  Tabs (Multiple Vitamin) .... Take 1 By Mouth Two Times A Day 7)  Lexapro 10 Mg Tabs (Escitalopram Oxalate) .... Once Daily 8)  Glipizide 5 Mg Tabs (Glipizide) .... Take 2 Tabs in Am 9)  Gabapentin 100 Mg Caps (Gabapentin) .... At Bedtime 10)  Furosemide 80 Mg Tabs (Furosemide) .... Take 2 By Mouth Two Times A Day 11)  Tylenol Ex St Arthritis Pain 500 Mg Tabs (Acetaminophen) .... Take 2 Tablets By Mouth Two Times A Day 12)  Prednisone 5 Mg Tabs (Prednisone) .... Take 1 By Mouth Once Daily 13)  Nitrostat 0.4 Mg Subl (Nitroglycerin) .... As  Needed 14)  Niacor 500 Mg Tabs (Niacin) .... Take 1 By Mouth Two Times A Day 15)  Lantus 100 Unit/ml Soln (Insulin Glargine) .... Inject 20 Units At Bedtime 16)  Amlodipine Besylate 10 Mg Tabs (Amlodipine Besylate) .... Take 1 By Mouth Once Daily 17)  Aspirin 325 Mg Tabs (Aspirin) .... Take 1 By Mouth Once Daily 18)  Albuterol Sulfate (2.5 Mg/74ml) 0.083% Nebu (Albuterol Sulfate) .Marland Kitchen.. 1 Vial Four Times A Day 19)  Cpap 13 Sms .... W/ O2 2 L/m 20)  Oxygen [employer] .... L/m 24 Hours  Goal Sat 90-94% 21)  Isosorbide Mononitrate Cr 30 Mg Xr24h-Tab (Isosorbide Mononitrate) .... Take 1 By Mouth Once Daily 22)  Lisinopril 10 Mg Tabs (Lisinopril)  .... Take 1/2 By Mouth Once Daily 23)  Metolazone 2.5 Mg Tabs (Metolazone) .... Take 1 By Mouth Every Other Day 24)  Cinnamon 500 Mg Tabs (Cinnamon) .... Take 2 By Mouth Once Daily  Allergies (verified): 1)  ! Sulfa 2)  ! Heparin 3)  ! * Ertapenem 4)  ! Morphine 5)  ! * Hydromorphone 6)  ! * Metoprolol 7)  ! * Donepezil 8)  ! * Hydralazine 9)  ! * Clopidogrel 10)  ! * Cefuroxime  Review of Systems      See HPI       The patient complains of shortness of breath with activity, non-productive cough, and irregular heartbeats.  The patient denies shortness of breath at rest, productive cough, coughing up blood, chest pain, acid heartburn, indigestion, loss of appetite, weight change, abdominal pain, difficulty swallowing, sore throat, tooth/dental problems, headaches, nasal congestion/difficulty breathing through nose, and sneezing.    Vital Signs:  Patient profile:   77 year old male Height:      69 inches Weight:      256.25 pounds BMI:     37.98 O2 Sat:      98 % on Room air Pulse rate:   61 / minute BP sitting:   158 / 58  (left arm) Cuff size:   large  Vitals Entered By: Gweneth Dimitri RN (July 07, 2010 2:49 PM)  O2 Flow:  Room air CC: 1 month follow up.  Pt states breathing and cough have improved.  Still having coughing spells at times.  Occas prod with yellow mucus. Comments Medications reviewed with patient Daytime contact number verified with patient. Gweneth Dimitri RN  July 07, 2010 2:50 PM    Physical Exam  Additional Exam:  General: A/Ox3; pleasant and cooperative, NAD, alert and talkative,. Supplemental O2 continuous- 98% room air at rest SKIN: no rash, lesions NODES: no lymphadenopathy HEENT: [place]/AT, EOM- WNL, Conjuctivae- clear, PERRLA, TM-WNL, Nose- clear, Throat- clear and wnl, Mallampati  III NECK: Supple w/ fair ROM, JVD- none obvious (thick neck), normal carotid impulses w/o bruits Thyroid- normal to palpation CHEST: Clear to P&A, diminshed.No  cough , wheeze or rhonchi HEART: RRR, no m/g/r heard ABDOMEN: significantly overweight ZOX:WRUE, nl pulses,  2 + edema feet. synovial thickening on fingers NEURO: Grossly intact to observation      CT of Chest  Procedure date:  06/06/2010  Findings:      CT CHEST W/O CM - [id number]   Clinical Data: Respiratory failure.  Bronchitis.   CT CHEST WITHOUT CONTRAST   Technique:  Multidetector CT imaging of the chest was performed following the standard protocol without IV contrast.   Comparison: 03/05/2010   Findings: The coronary artery and aortic atherosclerotic calcification is present.  There is likely  some calcification along the inferior pericardium.   No thoracic adenopathy identified.   Peripheral reticulonodular interstitial opacities are present in the right upper lobe, some of which have a tree in bud appearance suspicious for atypical infectious bronchiolitis.  There is mild scarring and volume loss in the right lower lobe, with a 4 mm right lower lobe nodule identified on image 36 of series 3.   Paraseptal emphysema noted.   Thoracic spondylosis is present.   Mild prominence of the epicardial adipose tissue is probably incidental.   IMPRESSION:   1.  Reticulonodular peripheral interstitial opacities in the right upper lobe, some of which of tree in bud appearance, suspicious for atypical infectious bronchiolitis. 2.  Paraseptal emphysema. 3.  Chronic  mild peripheral fibrosis in the lungs. 4.  4 mm nodule in the right lower lobe.  This is retrospectively stable back through 06/02/2004 and considered benign. 5.  Coronary artery and aortic atherosclerosis. 6.  Low calcifications along the anterior pericardium, stable from 2006.   Read By:  Dellia Cloud,  M.D.     Released By:  Dellia Cloud,  M.D.   Signed by Waymon Budge MD on 06/08/2010 at 6:02 PM  ________________________________________________________________________ CT shows  atherosclerosis with coronary artery disease. I have sent report to Dr Jeannetta Nap and he may want you to see a cardiologist. There is also evidence of a kind of chronic bronchitis that we will discuss on next visit.    Impression & Recommendations:  Problem # 1:  OBSTRUCTIVE SLEEP APNEA (ICD-327.23)  Good compliance and control. We do not need to change CPAP settings. His CPAP and poxygen are used all night every night and effective.   Problem # 2:  BRONCHITIS, CHRONIC (ICD-491.9) Acutely he is doing much better. We will try to sustain him on Dulera 100-5 with script.  CT suggests possibility of [facility] which I discussed with him and his daughter. We will place PPD and try to culture. I explained we wouldn't necessarily treat.   Problem # 3:  CORONARY ARTERY DISEASE (ICD-414.00) Discussed the atherosclerosis on CT and he will continue under Dr Hazle Coca good care.  His updated medication list for this problem includes:    Furosemide 80 Mg Tabs (Furosemide) .Marland Kitchen... Take 2 by mouth two times a day    Nitrostat 0.4 Mg Subl (Nitroglycerin) .Marland Kitchen... As needed    Amlodipine Besylate 10 Mg Tabs (Amlodipine besylate) .Marland Kitchen... Take 1 by mouth once daily    Aspirin 325 Mg Tabs (Aspirin) .Marland Kitchen... Take 1 by mouth once daily    Isosorbide Mononitrate Cr 30 Mg Xr24h-tab (Isosorbide mononitrate) .Marland Kitchen... Take 1 by mouth once daily    Lisinopril 10 Mg Tabs (Lisinopril) .Marland Kitchen... Take 1/2 by mouth once daily    Metolazone 2.5 Mg Tabs (Metolazone) .Marland Kitchen... Take 1 by mouth every other day  Orders: Est. Patient Level IV (16109)  Medications Added to Medication List This Visit: 1)  Dulera 100-5 Mcg/act Aero (Mometasone furo-formoterol fum) .... 2 puffs and rinse mouth, twice daily  Other Orders: T-Culture & Smear AFB (87206/[id number]) T-Culture, Sputum & Gram Stain (87070/[id number]) TB Skin Test (60454) Admin 1st Vaccine (09811)  Patient Instructions: 1)  Please schedule a follow-up appointment in 3 months. 2)  Lab 3)  PPD  skin test 4)  sample/ script for Dulera 100-5 5)    2 puffs and rinse mouth twice daily Prescriptions: DULERA 100-5 MCG/ACT AERO (MOMETASONE FURO-FORMOTEROL FUM) 2 puffs and rinse mouth, twice daily  #3 x 3  Entered and Authorized by:   Waymon Budge MD   Signed by:   Waymon Budge MD on 07/07/2010   Method used:   Print then Give to Patient   RxID:   [id number]    Immunizations Administered:  PPD Skin Test:    Vaccine Type: PPD    Site: right forearm    Mfr: Sanofi Pasteur    Dose: 0.1 ml    Route: ID    Given by: Gweneth Dimitri RN    Exp. Date: 01/24/2012    Lot #: [id number]    CT of Chest  Procedure date:  06/06/2010  Findings:      CT CHEST W/O CM - [id number]   Clinical Data: Respiratory failure.  Bronchitis.   CT CHEST WITHOUT CONTRAST   Technique:  Multidetector CT imaging of the chest was performed following the standard protocol without IV contrast.   Comparison: 03/05/2010   Findings: The coronary artery and aortic atherosclerotic calcification is present.  There is likely some calcification along the inferior pericardium.   No thoracic adenopathy identified.   Peripheral reticulonodular interstitial opacities are present in the right upper lobe, some of which have a tree in bud appearance suspicious for atypical infectious bronchiolitis.  There is mild scarring and volume loss in the right lower lobe, with a 4 mm right lower lobe nodule identified on image 36 of series 3.   Paraseptal emphysema noted.   Thoracic spondylosis is present.   Mild prominence of the epicardial adipose tissue is probably incidental.   IMPRESSION:   1.  Reticulonodular peripheral interstitial opacities in the right upper lobe, some of which of tree in bud appearance, suspicious for atypical infectious bronchiolitis. 2.  Paraseptal emphysema. 3.  Chronic  mild peripheral fibrosis in the lungs. 4.  4 mm nodule in the right lower lobe.  This is  retrospectively stable back through 06/02/2004 and considered benign. 5.  Coronary artery and aortic atherosclerosis. 6.  Low calcifications along the anterior pericardium, stable from 2006.   Read By:  Dellia Cloud,  M.D.     Released By:  Dellia Cloud,  M.D.   Signed by Waymon Budge MD on 06/08/2010 at 6:02 PM  ________________________________________________________________________ CT shows atherosclerosis with coronary artery disease. I have sent report to Dr Jeannetta Nap and he may want you to see a cardiologist. There is also evidence of a kind of chronic bronchitis that we will discuss on next visit.     Appended Document: 1 month rov/kp-READ TB SKIN TEST    Clinical Lists Changes  Observations: Added new observation of TB PPDRESULT: negative (07/10/2010 12:15) Added new observation of PPD RESULT: < 5mm (07/10/2010 12:15) Added new observation of TB-PPD RDDTE: 07/10/2010 (07/10/2010 12:15)       PPD Results    Date of reading: 07/10/2010    Results: < 5mm    Interpretation: negative

## 2010-08-06 LAB — BASIC METABOLIC PANEL
CO2: 30 mEq/L (ref 19–32)
Calcium: 8.7 mg/dL (ref 8.4–10.5)
GFR calc Af Amer: 48 mL/min — ABNORMAL LOW (ref >60)
Sodium: 141 mEq/L (ref 135–145)

## 2010-08-06 LAB — GLUCOSE, CAPILLARY: Glucose-Capillary: 159 mg/dL — ABNORMAL HIGH (ref 70–99)

## 2010-08-06 LAB — BRAIN NATRIURETIC PEPTIDE: Pro B Natriuretic peptide (BNP): 162 pg/mL — ABNORMAL HIGH (ref 0.0–100.0)

## 2010-08-07 LAB — CBC
HCT: 36.2 % — ABNORMAL LOW (ref 39.0–52.0)
Hemoglobin: 12.1 g/dL — ABNORMAL LOW (ref 13.0–17.0)
Hemoglobin: 12.3 g/dL — ABNORMAL LOW (ref 13.0–17.0)
MCH: 30.4 pg (ref 26.0–34.0)
MCH: 30.6 pg (ref 26.0–34.0)
MCH: 30.9 pg (ref 26.0–34.0)
MCHC: 32.3 g/dL (ref 30.0–36.0)
MCV: 95.5 fL (ref 78.0–100.0)
Platelets: 196 10*3/uL (ref 150–400)
Platelets: 207 10*3/uL (ref 150–400)
Platelets: 230 10*3/uL (ref 150–400)
RBC: 4.04 MIL/uL — ABNORMAL LOW (ref 4.22–5.81)
RDW: 15.7 % — ABNORMAL HIGH (ref 11.5–15.5)
RDW: 15.8 % — ABNORMAL HIGH (ref 11.5–15.5)
WBC: 6.8 10*3/uL (ref 4.0–10.5)
WBC: 6.9 10*3/uL (ref 4.0–10.5)

## 2010-08-07 LAB — BASIC METABOLIC PANEL
Calcium: 9.4 mg/dL (ref 8.4–10.5)
Chloride: 106 mEq/L (ref 96–112)
GFR calc Af Amer: 53 mL/min — ABNORMAL LOW (ref >60)
GFR calc Af Amer: >60 mL/min (ref >60)
GFR calc non Af Amer: 53 mL/min — ABNORMAL LOW (ref >60)
Potassium: 3.6 mEq/L (ref 3.5–5.1)
Potassium: 3.7 mEq/L (ref 3.5–5.1)
Sodium: 142 mEq/L (ref 135–145)

## 2010-08-07 LAB — GLUCOSE, CAPILLARY
Glucose-Capillary: 130 mg/dL — ABNORMAL HIGH (ref 70–99)
Glucose-Capillary: 144 mg/dL — ABNORMAL HIGH (ref 70–99)
Glucose-Capillary: 152 mg/dL — ABNORMAL HIGH (ref 70–99)
Glucose-Capillary: 179 mg/dL — ABNORMAL HIGH (ref 70–99)
Glucose-Capillary: 207 mg/dL — ABNORMAL HIGH (ref 70–99)
Glucose-Capillary: 213 mg/dL — ABNORMAL HIGH (ref 70–99)
Glucose-Capillary: 233 mg/dL — ABNORMAL HIGH (ref 70–99)

## 2010-08-07 LAB — POCT CARDIAC MARKERS
CKMB, poc: 1.2 ng/mL (ref 1.0–8.0)
Myoglobin, poc: 150 ng/mL (ref 12–200)
Troponin i, poc: <0.05 ng/mL (ref 0.00–0.09)

## 2010-08-07 LAB — COMPREHENSIVE METABOLIC PANEL
AST: 27 U/L (ref 0–37)
Alkaline Phosphatase: 90 U/L (ref 39–117)
BUN: 17 mg/dL (ref 6–23)
CO2: 29 mEq/L (ref 19–32)
Calcium: 9.1 mg/dL (ref 8.4–10.5)
Creatinine, Ser: 1.32 mg/dL (ref 0.4–1.5)
Creatinine, Ser: 1.55 mg/dL — ABNORMAL HIGH (ref 0.4–1.5)
GFR calc Af Amer: 53 mL/min — ABNORMAL LOW (ref >60)
GFR calc non Af Amer: 44 mL/min — ABNORMAL LOW (ref >60)
Glucose, Bld: 153 mg/dL — ABNORMAL HIGH (ref 70–99)
Potassium: 2.8 mEq/L — ABNORMAL LOW (ref 3.5–5.1)
Total Bilirubin: 0.8 mg/dL (ref 0.3–1.2)
Total Protein: 6.4 g/dL (ref 6.0–8.3)

## 2010-08-07 LAB — DIFFERENTIAL
Basophils Absolute: 0 10*3/uL (ref 0.0–0.1)
Basophils Relative: 0 % (ref 0–1)
Lymphocytes Relative: 28 % (ref 12–46)
Lymphocytes Relative: 32 % (ref 12–46)
Lymphs Abs: 1.9 10*3/uL (ref 0.7–4.0)
Monocytes Relative: 11 % (ref 3–12)
Monocytes Relative: 13 % — ABNORMAL HIGH (ref 3–12)
Neutro Abs: 3.5 10*3/uL (ref 1.7–7.7)
Neutro Abs: 4 10*3/uL (ref 1.7–7.7)
Neutrophils Relative %: 51 % (ref 43–77)
Neutrophils Relative %: 59 % (ref 43–77)

## 2010-08-07 LAB — URINALYSIS, ROUTINE W REFLEX MICROSCOPIC
Bilirubin Urine: NEGATIVE
Glucose, UA: NEGATIVE mg/dL
Specific Gravity, Urine: 1.007 (ref 1.005–1.030)
Urobilinogen, UA: 0.2 mg/dL (ref 0.0–1.0)
pH: 6.5 (ref 5.0–8.0)

## 2010-08-07 LAB — CARDIAC PANEL(CRET KIN+CKTOT+MB+TROPI)
CK, MB: 2 ng/mL (ref 0.3–4.0)
Total CK: 105 U/L (ref 7–232)
Troponin I: 0.04 ng/mL (ref 0.00–0.06)

## 2010-08-07 LAB — CK TOTAL AND CKMB (NOT AT ARMC)
Relative Index: INVALID (ref 0.0–2.5)
Total CK: 98 U/L (ref 7–232)

## 2010-08-07 LAB — BRAIN NATRIURETIC PEPTIDE: Pro B Natriuretic peptide (BNP): 259 pg/mL — ABNORMAL HIGH (ref 0.0–100.0)

## 2010-08-07 LAB — MAGNESIUM: Magnesium: 2 mg/dL (ref 1.5–2.5)

## 2010-08-07 LAB — TSH: TSH: 1.566 u[IU]/mL (ref 0.350–4.500)

## 2010-08-07 LAB — URINE MICROSCOPIC-ADD ON

## 2010-08-07 LAB — HEMOGLOBIN A1C: Hgb A1c MFr Bld: 7.4 % — ABNORMAL HIGH (ref <5.7)

## 2010-08-08 LAB — BASIC METABOLIC PANEL
BUN: 67 mg/dL — ABNORMAL HIGH (ref 6–23)
CO2: 26 mEq/L (ref 19–32)
Calcium: 8.8 mg/dL (ref 8.4–10.5)
Calcium: 9.5 mg/dL (ref 8.4–10.5)
Chloride: 103 mEq/L (ref 96–112)
Chloride: 104 mEq/L (ref 96–112)
Creatinine, Ser: 1.89 mg/dL — ABNORMAL HIGH (ref 0.4–1.5)
Creatinine, Ser: 2.68 mg/dL — ABNORMAL HIGH (ref 0.4–1.5)
GFR calc Af Amer: 28 mL/min — ABNORMAL LOW (ref >60)
GFR calc Af Amer: 42 mL/min — ABNORMAL LOW (ref >60)
GFR calc Af Amer: 52 mL/min — ABNORMAL LOW (ref >60)
GFR calc non Af Amer: 35 mL/min — ABNORMAL LOW (ref >60)
Glucose, Bld: 177 mg/dL — ABNORMAL HIGH (ref 70–99)
Potassium: 4.8 mEq/L (ref 3.5–5.1)

## 2010-08-08 LAB — GLUCOSE, CAPILLARY
Glucose-Capillary: 126 mg/dL — ABNORMAL HIGH (ref 70–99)
Glucose-Capillary: 135 mg/dL — ABNORMAL HIGH (ref 70–99)
Glucose-Capillary: 142 mg/dL — ABNORMAL HIGH (ref 70–99)
Glucose-Capillary: 176 mg/dL — ABNORMAL HIGH (ref 70–99)
Glucose-Capillary: 193 mg/dL — ABNORMAL HIGH (ref 70–99)
Glucose-Capillary: 194 mg/dL — ABNORMAL HIGH (ref 70–99)
Glucose-Capillary: 203 mg/dL — ABNORMAL HIGH (ref 70–99)
Glucose-Capillary: 210 mg/dL — ABNORMAL HIGH (ref 70–99)
Glucose-Capillary: 236 mg/dL — ABNORMAL HIGH (ref 70–99)
Glucose-Capillary: 279 mg/dL — ABNORMAL HIGH (ref 70–99)
Glucose-Capillary: 286 mg/dL — ABNORMAL HIGH (ref 70–99)
Glucose-Capillary: 295 mg/dL — ABNORMAL HIGH (ref 70–99)
Glucose-Capillary: 299 mg/dL — ABNORMAL HIGH (ref 70–99)
Glucose-Capillary: 306 mg/dL — ABNORMAL HIGH (ref 70–99)

## 2010-08-08 LAB — COMPREHENSIVE METABOLIC PANEL
BUN: 53 mg/dL — ABNORMAL HIGH (ref 6–23)
CO2: 25 mEq/L (ref 19–32)
Chloride: 100 mEq/L (ref 96–112)
Creatinine, Ser: 2.38 mg/dL — ABNORMAL HIGH (ref 0.4–1.5)
GFR calc non Af Amer: 27 mL/min — ABNORMAL LOW (ref >60)
Glucose, Bld: 125 mg/dL — ABNORMAL HIGH (ref 70–99)
Total Bilirubin: 0.6 mg/dL (ref 0.3–1.2)

## 2010-08-08 LAB — POCT CARDIAC MARKERS
CKMB, poc: 1.7 ng/mL (ref 1.0–8.0)
Myoglobin, poc: 391 ng/mL (ref 12–200)

## 2010-08-08 LAB — CBC
HCT: 33.2 % — ABNORMAL LOW (ref 39.0–52.0)
Hemoglobin: 11.6 g/dL — ABNORMAL LOW (ref 13.0–17.0)
MCH: 31.7 pg (ref 26.0–34.0)
MCH: 31.7 pg (ref 26.0–34.0)
MCHC: 32.8 g/dL (ref 30.0–36.0)
MCV: 95.5 fL (ref 78.0–100.0)
MCV: 96.8 fL (ref 78.0–100.0)
MCV: 99.2 fL (ref 78.0–100.0)
Platelets: 121 10*3/uL — ABNORMAL LOW (ref 150–400)
Platelets: 123 10*3/uL — ABNORMAL LOW (ref 150–400)
Platelets: 134 10*3/uL — ABNORMAL LOW (ref 150–400)
RBC: 3.66 MIL/uL — ABNORMAL LOW (ref 4.22–5.81)
RDW: 15.3 % (ref 11.5–15.5)
RDW: 15.9 % — ABNORMAL HIGH (ref 11.5–15.5)
WBC: 10.2 10*3/uL (ref 4.0–10.5)
WBC: 15.2 10*3/uL — ABNORMAL HIGH (ref 4.0–10.5)

## 2010-08-08 LAB — RENAL FUNCTION PANEL
Albumin: 3 g/dL — ABNORMAL LOW (ref 3.5–5.2)
Albumin: 3.2 g/dL — ABNORMAL LOW (ref 3.5–5.2)
BUN: 60 mg/dL — ABNORMAL HIGH (ref 6–23)
BUN: 77 mg/dL — ABNORMAL HIGH (ref 6–23)
Calcium: 8.9 mg/dL (ref 8.4–10.5)
GFR calc Af Amer: 43 mL/min — ABNORMAL LOW (ref >60)
GFR calc non Af Amer: 36 mL/min — ABNORMAL LOW (ref >60)
Glucose, Bld: 215 mg/dL — ABNORMAL HIGH (ref 70–99)
Phosphorus: 4.3 mg/dL (ref 2.3–4.6)
Phosphorus: 4.6 mg/dL (ref 2.3–4.6)
Potassium: 5.2 mEq/L — ABNORMAL HIGH (ref 3.5–5.1)
Sodium: 141 mEq/L (ref 135–145)

## 2010-08-08 LAB — POCT I-STAT 3, ART BLOOD GAS (G3+)
O2 Saturation: 99 %
Patient temperature: 100.1
TCO2: 29 mmol/L (ref 0–100)

## 2010-08-08 LAB — URINALYSIS, ROUTINE W REFLEX MICROSCOPIC
Glucose, UA: NEGATIVE mg/dL
Ketones, ur: 15 mg/dL — AB
Nitrite: NEGATIVE
pH: 5 (ref 5.0–8.0)

## 2010-08-08 LAB — DIFFERENTIAL
Basophils Absolute: 0 10*3/uL (ref 0.0–0.1)
Lymphocytes Relative: 30 % (ref 12–46)
Neutro Abs: 6.1 10*3/uL (ref 1.7–7.7)
Neutrophils Relative %: 59 % (ref 43–77)

## 2010-08-08 LAB — URINE MICROSCOPIC-ADD ON

## 2010-08-08 LAB — MAGNESIUM
Magnesium: 2.3 mg/dL (ref 1.5–2.5)
Magnesium: 2.7 mg/dL — ABNORMAL HIGH (ref 1.5–2.5)

## 2010-08-08 LAB — OSMOLALITY, URINE: Osmolality, Ur: 407 mOsm/kg (ref 390–1090)

## 2010-08-08 LAB — PHOSPHORUS: Phosphorus: 4.9 mg/dL — ABNORMAL HIGH (ref 2.3–4.6)

## 2010-08-08 LAB — URINE CULTURE: Special Requests: NEGATIVE

## 2010-08-08 LAB — LEGIONELLA ANTIGEN, URINE: Legionella Antigen, Urine: NEGATIVE

## 2010-08-08 LAB — PROTIME-INR: Prothrombin Time: 13.8 seconds (ref 11.6–15.2)

## 2010-08-08 LAB — CULTURE, BLOOD (ROUTINE X 2): Culture: NO GROWTH

## 2010-08-08 LAB — BRAIN NATRIURETIC PEPTIDE
Pro B Natriuretic peptide (BNP): 304 pg/mL — ABNORMAL HIGH (ref 0.0–100.0)
Pro B Natriuretic peptide (BNP): 855 pg/mL — ABNORMAL HIGH (ref 0.0–100.0)

## 2010-08-12 LAB — GLUCOSE, CAPILLARY
Glucose-Capillary: 126 mg/dL — ABNORMAL HIGH (ref 70–99)
Glucose-Capillary: 145 mg/dL — ABNORMAL HIGH (ref 70–99)
Glucose-Capillary: 155 mg/dL — ABNORMAL HIGH (ref 70–99)
Glucose-Capillary: 173 mg/dL — ABNORMAL HIGH (ref 70–99)

## 2010-08-12 LAB — HEMOCCULT GUIAC POC 1CARD (OFFICE): Fecal Occult Bld: POSITIVE

## 2010-08-12 LAB — BASIC METABOLIC PANEL
BUN: 29 mg/dL — ABNORMAL HIGH (ref 6–23)
Chloride: 103 mEq/L (ref 96–112)
Chloride: 103 mEq/L (ref 96–112)
GFR calc Af Amer: 53 mL/min — ABNORMAL LOW (ref >60)
Glucose, Bld: 129 mg/dL — ABNORMAL HIGH (ref 70–99)
Potassium: 4 mEq/L (ref 3.5–5.1)
Potassium: 4.1 mEq/L (ref 3.5–5.1)

## 2010-08-12 LAB — CBC
HCT: 30.9 % — ABNORMAL LOW (ref 39.0–52.0)
MCV: 98.1 fL (ref 78.0–100.0)
Platelets: 106 10*3/uL — ABNORMAL LOW (ref 150–400)
Platelets: 116 10*3/uL — ABNORMAL LOW (ref 150–400)
RDW: 14.9 % (ref 11.5–15.5)
WBC: 6 10*3/uL (ref 4.0–10.5)
WBC: 6.8 10*3/uL (ref 4.0–10.5)

## 2010-08-12 LAB — APTT: aPTT: 83 seconds — ABNORMAL HIGH (ref 24–37)

## 2010-08-12 LAB — PROTIME-INR: Prothrombin Time: 20 seconds — ABNORMAL HIGH (ref 11.6–15.2)

## 2010-08-13 ENCOUNTER — Inpatient Hospital Stay (HOSPITAL_COMMUNITY)
Admission: EM | Admit: 2010-08-13 | Discharge: 2010-08-15 | DRG: 551 | Disposition: A | Discharge status: Home Health | Payer: BC Managed Care – PPO | Attending: Family Medicine | Admitting: Family Medicine

## 2010-08-13 ENCOUNTER — Emergency Department (HOSPITAL_COMMUNITY): Payer: BC Managed Care – PPO

## 2010-08-13 DIAGNOSIS — D649 Anemia, unspecified: Secondary | ICD-10-CM | POA: Diagnosis present

## 2010-08-13 DIAGNOSIS — I5022 Chronic systolic (congestive) heart failure: Secondary | ICD-10-CM | POA: Diagnosis present

## 2010-08-13 DIAGNOSIS — F0390 Unspecified dementia without behavioral disturbance: Secondary | ICD-10-CM | POA: Diagnosis present

## 2010-08-13 DIAGNOSIS — Z79899 Other long term (current) drug therapy: Secondary | ICD-10-CM

## 2010-08-13 DIAGNOSIS — M109 Gout, unspecified: Secondary | ICD-10-CM | POA: Diagnosis present

## 2010-08-13 DIAGNOSIS — Z951 Presence of aortocoronary bypass graft: Secondary | ICD-10-CM

## 2010-08-13 DIAGNOSIS — J4489 Other specified chronic obstructive pulmonary disease: Secondary | ICD-10-CM | POA: Diagnosis present

## 2010-08-13 DIAGNOSIS — IMO0002 Reserved for concepts with insufficient information to code with codable children: Secondary | ICD-10-CM

## 2010-08-13 DIAGNOSIS — I509 Heart failure, unspecified: Secondary | ICD-10-CM | POA: Diagnosis present

## 2010-08-13 DIAGNOSIS — A088 Other specified intestinal infections: Principal | ICD-10-CM | POA: Diagnosis present

## 2010-08-13 DIAGNOSIS — G4733 Obstructive sleep apnea (adult) (pediatric): Secondary | ICD-10-CM | POA: Diagnosis present

## 2010-08-13 DIAGNOSIS — F3289 Other specified depressive episodes: Secondary | ICD-10-CM | POA: Diagnosis present

## 2010-08-13 DIAGNOSIS — E785 Hyperlipidemia, unspecified: Secondary | ICD-10-CM | POA: Diagnosis present

## 2010-08-13 DIAGNOSIS — E669 Obesity, unspecified: Secondary | ICD-10-CM | POA: Diagnosis present

## 2010-08-13 DIAGNOSIS — Z7982 Long term (current) use of aspirin: Secondary | ICD-10-CM

## 2010-08-13 DIAGNOSIS — F329 Major depressive disorder, single episode, unspecified: Secondary | ICD-10-CM | POA: Diagnosis present

## 2010-08-13 DIAGNOSIS — I129 Hypertensive chronic kidney disease with stage 1 through stage 4 chronic kidney disease, or unspecified chronic kidney disease: Secondary | ICD-10-CM | POA: Diagnosis present

## 2010-08-13 DIAGNOSIS — IMO0001 Reserved for inherently not codable concepts without codable children: Secondary | ICD-10-CM | POA: Diagnosis present

## 2010-08-13 DIAGNOSIS — Z882 Allergy status to sulfonamides status: Secondary | ICD-10-CM

## 2010-08-13 DIAGNOSIS — Z9861 Coronary angioplasty status: Secondary | ICD-10-CM

## 2010-08-13 DIAGNOSIS — J449 Chronic obstructive pulmonary disease, unspecified: Secondary | ICD-10-CM | POA: Diagnosis present

## 2010-08-13 DIAGNOSIS — I2510 Atherosclerotic heart disease of native coronary artery without angina pectoris: Secondary | ICD-10-CM | POA: Diagnosis present

## 2010-08-13 DIAGNOSIS — Z794 Long term (current) use of insulin: Secondary | ICD-10-CM

## 2010-08-13 DIAGNOSIS — N179 Acute kidney failure, unspecified: Secondary | ICD-10-CM | POA: Diagnosis present

## 2010-08-13 DIAGNOSIS — R112 Nausea with vomiting, unspecified: Secondary | ICD-10-CM | POA: Diagnosis present

## 2010-08-13 DIAGNOSIS — N183 Chronic kidney disease, stage 3 unspecified: Secondary | ICD-10-CM | POA: Diagnosis present

## 2010-08-13 LAB — COMPREHENSIVE METABOLIC PANEL
ALT: 32 U/L (ref 0–53)
AST: 34 U/L (ref 0–37)
Albumin: 3.2 g/dL — ABNORMAL LOW (ref 3.5–5.2)
BUN: 42 mg/dL — ABNORMAL HIGH (ref 6–23)
CO2: 31 mEq/L (ref 19–32)
CO2: 27 mEq/L (ref 19–32)
Calcium: 8.9 mg/dL (ref 8.4–10.5)
Calcium: 8.6 mg/dL (ref 8.4–10.5)
Creatinine, Ser: 1.76 mg/dL — ABNORMAL HIGH (ref 0.4–1.5)
GFR calc Af Amer: 31 mL/min — ABNORMAL LOW (ref >60)
GFR calc non Af Amer: 38 mL/min — ABNORMAL LOW (ref >60)
GFR calc non Af Amer: 25 mL/min — ABNORMAL LOW (ref >60)
Glucose, Bld: 107 mg/dL — ABNORMAL HIGH (ref 70–99)
Sodium: 139 mEq/L (ref 135–145)
Sodium: 142 mEq/L (ref 135–145)
Total Protein: 6.7 g/dL (ref 6.0–8.3)

## 2010-08-13 LAB — DIFFERENTIAL
Basophils Absolute: 0 10*3/uL (ref 0.0–0.1)
Eosinophils Absolute: 0.3 10*3/uL (ref 0.0–0.7)
Eosinophils Absolute: 0.2 10*3/uL (ref 0.0–0.7)
Lymphocytes Relative: 32 % (ref 12–46)
Lymphocytes Relative: 3 % — ABNORMAL LOW (ref 12–46)
Lymphs Abs: 1.7 10*3/uL (ref 0.7–4.0)
Lymphs Abs: 0.3 10*3/uL — ABNORMAL LOW (ref 0.7–4.0)
Monocytes Relative: 13 % — ABNORMAL HIGH (ref 3–12)
Monocytes Relative: 9 % (ref 3–12)
Neutrophils Relative %: 50 % (ref 43–77)
WBC Morphology: INCREASED

## 2010-08-13 LAB — CBC
HCT: 32.3 % — ABNORMAL LOW (ref 39.0–52.0)
Hemoglobin: 11.8 g/dL — ABNORMAL LOW (ref 13.0–17.0)
Hemoglobin: 13.1 g/dL (ref 13.0–17.0)
MCHC: 34.9 g/dL (ref 30.0–36.0)
MCHC: 31.4 g/dL (ref 30.0–36.0)
MCV: 98.8 fL (ref 78.0–100.0)
Platelets: 111 10*3/uL — ABNORMAL LOW (ref 150–400)
Platelets: 185 10*3/uL (ref 150–400)
RBC: 3.27 MIL/uL — ABNORMAL LOW (ref 4.22–5.81)
RBC: 3.45 MIL/uL — ABNORMAL LOW (ref 4.22–5.81)
RBC: 4.8 MIL/uL (ref 4.22–5.81)
WBC: 5.4 10*3/uL (ref 4.0–10.5)
WBC: 6.4 10*3/uL (ref 4.0–10.5)

## 2010-08-13 LAB — CARDIAC PANEL(CRET KIN+CKTOT+MB+TROPI)
CK, MB: 1.5 ng/mL (ref 0.3–4.0)
CK, MB: 1.5 ng/mL (ref 0.3–4.0)
Relative Index: 1.5 (ref 0.0–2.5)
Relative Index: INVALID (ref 0.0–2.5)
Troponin I: 0.03 ng/mL (ref 0.00–0.06)

## 2010-08-13 LAB — APTT: aPTT: 64 seconds — ABNORMAL HIGH (ref 24–37)

## 2010-08-13 LAB — BASIC METABOLIC PANEL
BUN: 39 mg/dL — ABNORMAL HIGH (ref 6–23)
CO2: 31 mEq/L (ref 19–32)
Calcium: 9.1 mg/dL (ref 8.4–10.5)
Chloride: 98 mEq/L (ref 96–112)
Creatinine, Ser: 1.85 mg/dL — ABNORMAL HIGH (ref 0.4–1.5)
GFR calc Af Amer: 43 mL/min — ABNORMAL LOW (ref >60)
GFR calc Af Amer: 45 mL/min — ABNORMAL LOW (ref >60)
GFR calc non Af Amer: 36 mL/min — ABNORMAL LOW (ref >60)
Potassium: 4 mEq/L (ref 3.5–5.1)
Sodium: 141 mEq/L (ref 135–145)

## 2010-08-13 LAB — GLUCOSE, CAPILLARY
Glucose-Capillary: 129 mg/dL — ABNORMAL HIGH (ref 70–99)
Glucose-Capillary: 226 mg/dL — ABNORMAL HIGH (ref 70–99)

## 2010-08-13 LAB — POCT CARDIAC MARKERS
CKMB, poc: <1 ng/mL — ABNORMAL LOW (ref 1.0–8.0)
Myoglobin, poc: 150 ng/mL (ref 12–200)
Troponin i, poc: <0.05 ng/mL (ref 0.00–0.09)

## 2010-08-13 LAB — LIPID PANEL
Cholesterol: 133 mg/dL (ref 0–200)
HDL: 34 mg/dL — ABNORMAL LOW (ref >39)
Total CHOL/HDL Ratio: 3.9 RATIO
Triglycerides: 228 mg/dL — ABNORMAL HIGH (ref <150)

## 2010-08-13 LAB — TROPONIN I: Troponin I: 0.02 ng/mL (ref 0.00–0.06)

## 2010-08-13 LAB — CK TOTAL AND CKMB (NOT AT ARMC)
CK, MB: 1.4 ng/mL (ref 0.3–4.0)
Total CK: 117 U/L (ref 7–232)

## 2010-08-13 LAB — PROTIME-INR
INR: 1.61 — ABNORMAL HIGH (ref 0.00–1.49)
Prothrombin Time: 19 seconds — ABNORMAL HIGH (ref 11.6–15.2)

## 2010-08-13 LAB — HEPARIN LEVEL (UNFRACTIONATED): Heparin Unfractionated: <0.1 IU/mL — ABNORMAL LOW (ref 0.30–0.70)

## 2010-08-13 LAB — MAGNESIUM: Magnesium: 2.4 mg/dL (ref 1.5–2.5)

## 2010-08-14 LAB — COMPREHENSIVE METABOLIC PANEL
ALT: 31 U/L (ref 0–53)
Alkaline Phosphatase: 65 U/L (ref 39–117)
BUN: 46 mg/dL — ABNORMAL HIGH (ref 6–23)
CO2: 28 mEq/L (ref 19–32)
Chloride: 109 mEq/L (ref 96–112)
GFR calc non Af Amer: 26 mL/min — ABNORMAL LOW (ref >60)
Glucose, Bld: 78 mg/dL (ref 70–99)
Potassium: 4.6 mEq/L (ref 3.5–5.1)
Sodium: 147 mEq/L — ABNORMAL HIGH (ref 135–145)
Total Bilirubin: 0.5 mg/dL (ref 0.3–1.2)
Total Protein: 6.1 g/dL (ref 6.0–8.3)

## 2010-08-14 LAB — CBC
Platelets: 177 10*3/uL (ref 150–400)
RBC: 4.49 MIL/uL (ref 4.22–5.81)
RDW: 17.2 % — ABNORMAL HIGH (ref 11.5–15.5)
WBC: 8.1 10*3/uL (ref 4.0–10.5)

## 2010-08-14 LAB — BRAIN NATRIURETIC PEPTIDE: Pro B Natriuretic peptide (BNP): 125 pg/mL — ABNORMAL HIGH (ref 0.0–100.0)

## 2010-08-14 LAB — CLOSTRIDIUM DIFFICILE BY PCR: Toxigenic C. Difficile by PCR: NEGATIVE

## 2010-08-14 LAB — GLUCOSE, CAPILLARY: Glucose-Capillary: 105 mg/dL — ABNORMAL HIGH (ref 70–99)

## 2010-08-15 LAB — COMPREHENSIVE METABOLIC PANEL
ALT: 31 U/L (ref 0–53)
AST: 37 U/L (ref 0–37)
Alkaline Phosphatase: 61 U/L (ref 39–117)
CO2: 23 mEq/L (ref 19–32)
Calcium: 8.4 mg/dL (ref 8.4–10.5)
GFR calc Af Amer: 53 mL/min — ABNORMAL LOW (ref >60)
GFR calc non Af Amer: 44 mL/min — ABNORMAL LOW (ref >60)
Glucose, Bld: 125 mg/dL — ABNORMAL HIGH (ref 70–99)
Potassium: 3.7 mEq/L (ref 3.5–5.1)
Sodium: 137 mEq/L (ref 135–145)
Total Protein: 5.6 g/dL — ABNORMAL LOW (ref 6.0–8.3)

## 2010-08-15 LAB — MAGNESIUM: Magnesium: 2.1 mg/dL (ref 1.5–2.5)

## 2010-08-15 LAB — GIARDIA/CRYPTOSPORIDIUM SCREEN(EIA)

## 2010-08-16 NOTE — Discharge Summary (Signed)
[name], [name]              ACCOUNT NO.:  [id number]  MEDICAL RECORD NO.:  [id number]           PATIENT TYPE:  I  LOCATION:  6736                         FACILITY:  [facility]  PHYSICIAN:  Brendia Sacks, MD    DATE OF BIRTH:  November 26, 1933  DATE OF ADMISSION:  08/13/2010 DATE OF DISCHARGE:  08/15/2010                              DISCHARGE SUMMARY   PRIMARY CARE PHYSICIAN:  Thomas Guard, MD  PRIMARY CARDIOLOGIST:  Thomas Batty, MD  CONDITION ON DISCHARGE:  Improved.  DISCHARGE DIAGNOSES: 1. Nausea and vomiting, resolved. 2. Diarrhea, presumably viral in nature, resolving. 3. Acute renal failure, resolved. 4. Elevated AST, resolved. 5. Chronic normocytic anemia, stable. 6. Diabetes mellitus 2, uncontrolled by Hgb A1c. Stable as an inpatient.  HISTORY OF PRESENT ILLNESS:  This is a 77 year old man who presented to the emergency room with diarrhea, nausea, and vomiting and multiple sick contacts including his wife and son.  HOSPITAL COURSE: 1. Nausea and vomiting.  This quickly resolved. 2. Diarrhea.  C. difficile PCR was negative.  The patient's diarrhea     has improved spontaneously.  He now has had formed stool and feels     ready go home. 3. Acute renal failure.  This was secondary to his diarrhea.  This has     resolved back to his baseline chronic kidney disease.  He can     resume his ACE inhibitor today and resume his Lasix tomorrow. He was not taking     metolazone as an outpatient. 4. Elevated AST.  This resolved spontaneously.  He is on a statin.     This was only minimally elevated, can be repeated     in the outpatient setting.  I note that he has had a     mild elevation of his AST dating as far back as June 2006. 5. Chronic normocytic anemia.  This appears to be stable. 6. Diabetes mellitus type 2.  This has remained stable. 7. Obstructive sleep apnea.  He continues on CPAP.  CONSULTATIONS:  None.  PROCEDURES:  None.  IMAGING:  Chest x-ray on August 14, 2010:  No acute disease.  PERTINENT LABORATORY STUDIES: 1. PCR C. difficile was negative. 2. Hemoglobin A1c 8.8. 3. MRSA nasal screen positive. 4. CBC essentially unremarkable with white blood cell count of 8.1,     platelet count 177, and hemoglobin of 12.3, which appears to be his     baseline. 5. Capillary blood sugar stable. 6. BUN was 41 on admission, on discharge it is 30.  Creatinine on     admission 2.49, on discharge 1.56.  Review of his records     demonstrates that his baseline appears to be approximately 1.5 or     actually higher with review of data points extending back     to 2001. 7. Hepatic function panel essentially unremarkable except for AST of     40 and today is 37.  DISCHARGE PHYSICAL EXAMINATION:  GENERAL:  The patient is feeling well. Stool is starting to be formed in consistency. VITAL SIGNS:  Temperature is 98.3, pulse 61, respirations 18, blood pressure 159/57,  and sat 93% on room air. CARDIOVASCULAR:  Regular rate and rhythm.  No murmur, rub, or gallop. RESPIRATORY:  Clear to auscultation bilaterally.  No wheezes, rales, or rhonchi.  Normal respiratory effort. TELEMETRY:  No acute abnormalities.  There is notation in the chart about nonsustained V tach.  However, on review of this, it appears to be a brief episode of SVT.  It does not appear that he has had further events, although his telemetry is limited secondary to poor lead pickup. RESPIRATORY:  Clear to auscultation bilaterally.  No wheezes, rales, or rhonchi. ABDOMEN:  Soft.  No significant lower extremity edema.  DISCHARGE INSTRUCTIONS:  The patient will be discharged home today.  He should follow up with Dr. Windle Knox in about 2 weeks.  DIET:  Low-sodium, heart-healthy, diabetic diet.  ACTIVITIES:  Unrestricted.  DISCHARGE MEDICATIONS: 1. Mupirocin ointment to both nares b.i.d. through August 19, 2010. 2. Albuterol nebulizer 1 nebulization 4 times a day as needed for      shortness of breath or wheezing. 3. Amlodipine 10 mg p.o. daily. 4. Aspirin 325 mg p.o. daily. 5. Cinnamon p.o. daily. 6. Colchicine 0.6 mg p.o. daily. 7. Folic acid 400 mg p.o. daily. 8. Furosemide 80 mg 1-1/2 tablets p.o. b.i.d. 9. Gabapentin 100 mg p.o. nightly. 10.Imdur XR 30 mg p.o. daily. 11.Lantus 40 units subcu b.i.d. 12.Lexapro 10 mg p.o. daily. 13.Lipitor 40 mg p.o. nightly. 14.Lisinopril 10 mg tablet; 1/2 tablet in the morning and 1 tablet in     the evening. 15.Multivitamin p.o. 1/2 tablet p.o. b.i.d. 16.Niacin 500 mg p.o. b.i.d. 17.Prednisone 5 mg p.o. daily. 18.Protonix 40 mg p.o. daily. 19.Symbicort 160/4.5 mcg 2 puffs inhaled b.i.d. 20.Tylenol 650 mg p.o. b.i.d. 21.Zetia 10 mg p.o. daily.  TIME OF COORDINATING DISCHARGE:  20 minutes.     Brendia Sacks, MD     DG/MEDQ  D:  08/15/2010  T:  08/16/2010  Job:  [id number]  cc:   [name], M.D.  Electronically Signed by Brendia Sacks  on 08/16/2010 12:39:42 PM

## 2010-08-18 LAB — GLUCOSE, CAPILLARY
Glucose-Capillary: 130 mg/dL — ABNORMAL HIGH (ref 70–99)
Glucose-Capillary: 139 mg/dL — ABNORMAL HIGH (ref 70–99)

## 2010-08-18 LAB — STOOL CULTURE

## 2010-08-22 LAB — AFB CULTURE WITH SMEAR (NOT AT ARMC): Acid Fast Smear: NONE SEEN

## 2010-08-24 NOTE — H&P (Signed)
[name], [name]              ACCOUNT NO.:  [id number]  MEDICAL RECORD NO.:  [id number]           PATIENT TYPE:  E  LOCATION:  [facility]                         FACILITY:  [facility]  PHYSICIAN:  Thomas Najjar, MD     DATE OF BIRTH:  [birth year]  DATE OF ADMISSION:  08/13/2010 DATE OF DISCHARGE:                             HISTORY & PHYSICAL   PRIMARY CARE PHYSICIAN:  Thomas Guard, MD  CARDIOLOGIST:  Thomas Batty, MD  CODE STATUS:  The patient is a full code.  CHIEF COMPLAINT:  Diarrhea, nausea, and vomiting.  HISTORY OF PRESENT ILLNESS:  [name] is a 77 year old Caucasian man with multiple comorbidities presented to the ER today with chief complaint of diarrhea, nausea, vomiting, and abdominal cramping started today.  He described his diarrhea as watery frequent, had more than 10 episodes since this morning associated with nausea and multiple episodes of vomiting.  His diarrhea is nonbloody.  His vomitus is yellow inemesis with no blood.  His condition associated with intermittent abdominal cramping and fever.  As per the patient, he has couple of his family members with the same symptoms today.  Denies any recent travel. Denies any concern for possible food poisoning.  In the ED, the patient was found to have low-grade fever of 100.2 and was clinically dehydrated with acute on chronic kidney failure and we were asked to see him for admission.  PAST MEDICAL HISTORY: 1. Coronary artery disease status post CABG. 2. Systolic congestive heart failure. 3. Hypertension. 4. Chronic obstructive pulmonary disease. 5. Obstructive sleep apnea. 6. Hyperlipidemia. 7. CKD stage III 8. Diabetes mellitus type 2. 9. History of heparin-induced thrombocytopenia. 10.History of paroxysmal atrial fibrillation. 11.History of gout. 12.Sick sinus syndrome. 13.History of spinal stenosis. 14.Anemia. 15.Dementia. 16.History of depression.  PAST SURGICAL HISTORY:  CABG.  SOCIAL HISTORY:   Lives with his wife at home.  As per him, used to smoke heavily in the past.  However, quit years ago and he also used to drink alcohol.  Currently, nondrinker.  Denies any illicit drug use.  ALLERGIES:  SULFA DRUGS, HEPARIN, INSULINS, MORPHINE SULFATE, HYDROMORPHONE, METOPROLOL, ARICEPT, HYDRALAZINE, PLAVIX, and CEFUROXIME.  HOME MEDICATION:  Reconciliation form is pending at the time of dictation and the patient was unable to specify all his meds.  However as per his last discharge summary from October 2011, the patient was on; 1. Lasix. 2. Imdur. 3. Lisinopril. 4. Albuterol inhaler. 5. Norvasc. 6. Colchicine. 7. Iron sulfate. 8. Folic acid. 9. Glipizide. 10.Gabapentin. 11.Lantus insulin. 12.Lexapro. 13.Lipitor. 14.Multivitamin. 15.Niacin. 16.Nitroglycerin. 17.Prednisone. 18.Protonix. 19.Symbicort. 20.Tylenol. 21.Zetia.  We are still awaiting verification of those medications.  FAMILY HISTORY:  Significant for diabetes and heart disease.  REVIEW OF SYSTEMS:  As above in HPI. CHEST:  Denies any cough or shortness of breath. CARDIOVASCULAR:  She denies any chest pain or palpitation. ABDOMEN:  As above in the HPI.  Significant for diarrhea, abdominal cramping, nausea, and vomiting. CONSTITUTIONAL:  Low-grade fever. GENITOURINARY:  Denies any dysuria, flank pain, frequency, or hematuria. NEUROLOGIC:  Denies any headaches or dizziness.  No motor or sensory symptoms.  PHYSICAL EXAMINATION:  VITAL SIGNS:  Blood  pressure 118/46, pulse 86, respiratory rate 18, temperature 100.2, and O2 sat 100% on 2 L of oxygen. GENERAL:  He is alert. NECK:  Supple.  No JVD. LUNGS:  Decreased breathing sounds at the bases.  I did not appreciate any rales or wheezing. CARDIOVASCULAR:  S1 and S2.  Regular rhythm and rate. ABDOMEN:  Obese, soft, and nontender.  Bowel sounds heard normally. EXTREMITIES:  Showed trace pedal edema. NEUROLOGIC:  The patient moves all his extremities.  No  focal deficit appreciated.  LABORATORY DATA:  WBCs 10.6, hemoglobin 13.1, hematocrit 41.7, and platelet count 185.  Sodium 142, potassium 4.4, BUN 41, creatinine 2.49, and albumin 3.2.  RADIOLOGY/IMAGING:  Chest x-ray showed no acute findings.  Stable cardiomegaly.  ASSESSMENT/PLAN: 1. Diarrhea/nausea and vomiting, probably secondary to viral     gastroenteritis; however, Clostridium difficile also need to be     ruled out.  I will place him on isolation pending C. diff and PCR     and empirically start him on Flagyl.  We can remove isolation and     discontinue  Flagyl if his PCR is negative.  We will hydrate him     with normal saline.  Hold Lasix and lisinopril for now. 2. Acute on chronic kidney injury history.  If baseline creatinine is     1.6, creatinine is up to 2.4, most likely prerenal secondary to     dehydration, the patient will be hydrated and we are holding Lasix     and lisinopril.  Follow repeat labs in the a.m. 3. History of systolic congestive heart failure.  I was unable to     locate congestive heart failure as per echo.  In October 2011,     however, he currently looked dry.  We will hold his Lasix and     gently hydrating him and we will watch him for any symptoms of     volume overload. 4. Hypertension, controlled. 5. Diabetes mellitus type 2, given his current symptoms.  I will hold     his Lantus insulin and place him on sliding scale.  Monitor his     CBGs. 6. History of chronic obstructive pulmonary disease, currently stable.     We will give nebs p.r.n. 7. History of obstructive sleep apnea.  We will continue CPAP. 8. Coronary artery disease.  No acute issues.  The patient has no     symptoms of chest pain.  His EKG was unchanged.  We will continue     his home meds. 9. DVT and GI prophylaxis. 10.Code status.  The patient is a full code.          ______________________________ Thomas Najjar, MD     SA/MEDQ  D:  08/13/2010  T:  08/13/2010   Job:  [id number]  cc:   [name], M.D. [name], M.D.  Electronically Signed by Thomas Beat MD on 08/24/2010 08:41:04 PM

## 2010-08-27 LAB — BASIC METABOLIC PANEL
Chloride: 104 mEq/L (ref 96–112)
Creatinine, Ser: 1.46 mg/dL (ref 0.4–1.5)
GFR calc Af Amer: 57 mL/min — ABNORMAL LOW (ref >60)
Potassium: 4 mEq/L (ref 3.5–5.1)

## 2010-08-27 LAB — PLATELET COUNT: Platelets: 153 10*3/uL (ref 150–400)

## 2010-08-27 LAB — GLUCOSE, CAPILLARY
Glucose-Capillary: 183 mg/dL — ABNORMAL HIGH (ref 70–99)
Glucose-Capillary: 185 mg/dL — ABNORMAL HIGH (ref 70–99)

## 2010-08-28 LAB — CBC
HCT: 34.8 % — ABNORMAL LOW (ref 39.0–52.0)
Hemoglobin: 12 g/dL — ABNORMAL LOW (ref 13.0–17.0)
Hemoglobin: 12.3 g/dL — ABNORMAL LOW (ref 13.0–17.0)
MCHC: 34.5 g/dL (ref 30.0–36.0)
MCV: 98.3 fL (ref 78.0–100.0)
RBC: 3.55 MIL/uL — ABNORMAL LOW (ref 4.22–5.81)
RDW: 15.5 % (ref 11.5–15.5)

## 2010-08-28 LAB — BASIC METABOLIC PANEL
CO2: 31 mEq/L (ref 19–32)
Calcium: 8.9 mg/dL (ref 8.4–10.5)
Chloride: 101 mEq/L (ref 96–112)
Creatinine, Ser: 1.34 mg/dL (ref 0.4–1.5)
GFR calc Af Amer: >60 mL/min (ref >60)
GFR calc Af Amer: >60 mL/min (ref >60)
GFR calc non Af Amer: 50 mL/min — ABNORMAL LOW (ref >60)
Glucose, Bld: 164 mg/dL — ABNORMAL HIGH (ref 70–99)
Potassium: 4.3 mEq/L (ref 3.5–5.1)
Sodium: 140 mEq/L (ref 135–145)
Sodium: 141 mEq/L (ref 135–145)

## 2010-08-28 LAB — APTT: aPTT: 28 seconds (ref 24–37)

## 2010-08-28 LAB — PROTIME-INR: INR: 1 (ref 0.00–1.49)

## 2010-08-28 LAB — GLUCOSE, CAPILLARY: Glucose-Capillary: 203 mg/dL — ABNORMAL HIGH (ref 70–99)

## 2010-08-29 LAB — CBC
HCT: 33.3 % — ABNORMAL LOW (ref 39.0–52.0)
Hemoglobin: 11.4 g/dL — ABNORMAL LOW (ref 13.0–17.0)
MCV: 96.3 fL (ref 78.0–100.0)
Platelets: 113 10*3/uL — ABNORMAL LOW (ref 150–400)
RDW: 14.4 % (ref 11.5–15.5)

## 2010-08-29 LAB — BASIC METABOLIC PANEL
BUN: 21 mg/dL (ref 6–23)
CO2: 33 mEq/L — ABNORMAL HIGH (ref 19–32)
Chloride: 101 mEq/L (ref 96–112)
Glucose, Bld: 150 mg/dL — ABNORMAL HIGH (ref 70–99)
Potassium: 4.3 mEq/L (ref 3.5–5.1)

## 2010-08-30 LAB — CBC
Hemoglobin: 11.8 g/dL — ABNORMAL LOW (ref 13.0–17.0)
MCHC: 33.8 g/dL (ref 30.0–36.0)
RBC: 3.64 MIL/uL — ABNORMAL LOW (ref 4.22–5.81)
WBC: 6.5 10*3/uL (ref 4.0–10.5)

## 2010-08-30 LAB — BASIC METABOLIC PANEL
CO2: 30 mEq/L (ref 19–32)
CO2: 30 mEq/L (ref 19–32)
Calcium: 8.5 mg/dL (ref 8.4–10.5)
Calcium: 9.5 mg/dL (ref 8.4–10.5)
Chloride: 100 mEq/L (ref 96–112)
Creatinine, Ser: 1.44 mg/dL (ref 0.4–1.5)
GFR calc Af Amer: 52 mL/min — ABNORMAL LOW (ref >60)
GFR calc Af Amer: 58 mL/min — ABNORMAL LOW (ref >60)
GFR calc non Af Amer: 48 mL/min — ABNORMAL LOW (ref >60)
Sodium: 138 mEq/L (ref 135–145)
Sodium: 143 mEq/L (ref 135–145)

## 2010-08-30 LAB — GLUCOSE, CAPILLARY
Glucose-Capillary: 103 mg/dL — ABNORMAL HIGH (ref 70–99)
Glucose-Capillary: 120 mg/dL — ABNORMAL HIGH (ref 70–99)
Glucose-Capillary: 54 mg/dL — ABNORMAL LOW (ref 70–99)

## 2010-09-04 LAB — IRON AND TIBC
Saturation Ratios: 10 % — ABNORMAL LOW (ref 20–55)
TIBC: 192 ug/dL — ABNORMAL LOW (ref 215–435)
UIBC: 173 ug/dL

## 2010-09-04 LAB — SYNOVIAL CELL COUNT + DIFF, W/ CRYSTALS
Eosinophils-Synovial: 0 % (ref 0–1)
Lymphocytes-Synovial Fld: 1 % (ref 0–20)
Neutrophil, Synovial: 94 % — ABNORMAL HIGH (ref 0–25)
WBC, Synovial: 41365 /mm3 — ABNORMAL HIGH (ref 0–200)

## 2010-09-04 LAB — GLUCOSE, CAPILLARY
Glucose-Capillary: 105 mg/dL — ABNORMAL HIGH (ref 70–99)
Glucose-Capillary: 111 mg/dL — ABNORMAL HIGH (ref 70–99)
Glucose-Capillary: 120 mg/dL — ABNORMAL HIGH (ref 70–99)
Glucose-Capillary: 122 mg/dL — ABNORMAL HIGH (ref 70–99)
Glucose-Capillary: 135 mg/dL — ABNORMAL HIGH (ref 70–99)
Glucose-Capillary: 147 mg/dL — ABNORMAL HIGH (ref 70–99)
Glucose-Capillary: 148 mg/dL — ABNORMAL HIGH (ref 70–99)
Glucose-Capillary: 152 mg/dL — ABNORMAL HIGH (ref 70–99)
Glucose-Capillary: 165 mg/dL — ABNORMAL HIGH (ref 70–99)
Glucose-Capillary: 201 mg/dL — ABNORMAL HIGH (ref 70–99)
Glucose-Capillary: 210 mg/dL — ABNORMAL HIGH (ref 70–99)
Glucose-Capillary: 227 mg/dL — ABNORMAL HIGH (ref 70–99)
Glucose-Capillary: 55 mg/dL — ABNORMAL LOW (ref 70–99)
Glucose-Capillary: 57 mg/dL — ABNORMAL LOW (ref 70–99)
Glucose-Capillary: 60 mg/dL — ABNORMAL LOW (ref 70–99)
Glucose-Capillary: 89 mg/dL (ref 70–99)
Glucose-Capillary: 90 mg/dL (ref 70–99)
Glucose-Capillary: 112 mg/dL — ABNORMAL HIGH (ref 70–99)
Glucose-Capillary: 125 mg/dL — ABNORMAL HIGH (ref 70–99)
Glucose-Capillary: 97 mg/dL (ref 70–99)

## 2010-09-04 LAB — BASIC METABOLIC PANEL
BUN: 65 mg/dL — ABNORMAL HIGH (ref 6–23)
CO2: 23 mEq/L (ref 19–32)
CO2: 28 mEq/L (ref 19–32)
CO2: 31 mEq/L (ref 19–32)
CO2: 33 mEq/L — ABNORMAL HIGH (ref 19–32)
Calcium: 8.3 mg/dL — ABNORMAL LOW (ref 8.4–10.5)
Calcium: 8.6 mg/dL (ref 8.4–10.5)
Calcium: 8.8 mg/dL (ref 8.4–10.5)
Calcium: 9 mg/dL (ref 8.4–10.5)
Calcium: 9.4 mg/dL (ref 8.4–10.5)
Chloride: 100 mEq/L (ref 96–112)
Chloride: 105 mEq/L (ref 96–112)
Chloride: 106 mEq/L (ref 96–112)
Creatinine, Ser: 1.33 mg/dL (ref 0.4–1.5)
Creatinine, Ser: 1.5 mg/dL (ref 0.4–1.5)
Creatinine, Ser: 2.24 mg/dL — ABNORMAL HIGH (ref 0.4–1.5)
Creatinine, Ser: 2.35 mg/dL — ABNORMAL HIGH (ref 0.4–1.5)
GFR calc Af Amer: 27 mL/min — ABNORMAL LOW (ref >60)
GFR calc Af Amer: 33 mL/min — ABNORMAL LOW (ref >60)
GFR calc Af Amer: 53 mL/min — ABNORMAL LOW (ref >60)
GFR calc Af Amer: 56 mL/min — ABNORMAL LOW (ref >60)
GFR calc non Af Amer: 27 mL/min — ABNORMAL LOW (ref >60)
GFR calc non Af Amer: 44 mL/min — ABNORMAL LOW (ref >60)
GFR calc non Af Amer: 46 mL/min — ABNORMAL LOW (ref >60)
Glucose, Bld: 104 mg/dL — ABNORMAL HIGH (ref 70–99)
Potassium: 4.3 mEq/L (ref 3.5–5.1)
Sodium: 136 mEq/L (ref 135–145)
Sodium: 141 mEq/L (ref 135–145)
Sodium: 141 mEq/L (ref 135–145)
Sodium: 146 mEq/L — ABNORMAL HIGH (ref 135–145)

## 2010-09-04 LAB — COMPREHENSIVE METABOLIC PANEL
ALT: 12 U/L (ref 0–53)
ALT: 18 U/L (ref 0–53)
AST: 20 U/L (ref 0–37)
Albumin: 2.8 g/dL — ABNORMAL LOW (ref 3.5–5.2)
Albumin: 3.6 g/dL (ref 3.5–5.2)
Alkaline Phosphatase: 41 U/L (ref 39–117)
BUN: 47 mg/dL — ABNORMAL HIGH (ref 6–23)
BUN: 49 mg/dL — ABNORMAL HIGH (ref 6–23)
CO2: 27 mEq/L (ref 19–32)
Calcium: 8.6 mg/dL (ref 8.4–10.5)
Calcium: 8.8 mg/dL (ref 8.4–10.5)
Calcium: 9.4 mg/dL (ref 8.4–10.5)
Creatinine, Ser: 2.25 mg/dL — ABNORMAL HIGH (ref 0.4–1.5)
Creatinine, Ser: 1.64 mg/dL — ABNORMAL HIGH (ref 0.4–1.5)
Creatinine, Ser: 2.4 mg/dL — ABNORMAL HIGH (ref 0.4–1.5)
GFR calc Af Amer: 50 mL/min — ABNORMAL LOW (ref >60)
GFR calc non Af Amer: 29 mL/min — ABNORMAL LOW (ref >60)
Glucose, Bld: 201 mg/dL — ABNORMAL HIGH (ref 70–99)
Glucose, Bld: 99 mg/dL (ref 70–99)
Potassium: 4.6 mEq/L (ref 3.5–5.1)
Sodium: 140 mEq/L (ref 135–145)
Total Protein: 6 g/dL (ref 6.0–8.3)
Total Protein: 5.8 g/dL — ABNORMAL LOW (ref 6.0–8.3)
Total Protein: 7.7 g/dL (ref 6.0–8.3)

## 2010-09-04 LAB — URINALYSIS, ROUTINE W REFLEX MICROSCOPIC
Bilirubin Urine: NEGATIVE
Ketones, ur: NEGATIVE mg/dL
Nitrite: NEGATIVE
Specific Gravity, Urine: 1.013 (ref 1.005–1.030)
Urobilinogen, UA: 0.2 mg/dL (ref 0.0–1.0)
pH: 5.5 (ref 5.0–8.0)

## 2010-09-04 LAB — CARDIAC PANEL(CRET KIN+CKTOT+MB+TROPI)
Relative Index: INVALID (ref 0.0–2.5)
Total CK: 22 U/L (ref 7–232)
Troponin I: 0.01 ng/mL (ref 0.00–0.06)
Troponin I: 0.03 ng/mL (ref 0.00–0.06)

## 2010-09-04 LAB — CBC
HCT: 26.6 % — ABNORMAL LOW (ref 39.0–52.0)
HCT: 26.8 % — ABNORMAL LOW (ref 39.0–52.0)
HCT: 26.2 % — ABNORMAL LOW (ref 39.0–52.0)
Hemoglobin: 11.6 g/dL — ABNORMAL LOW (ref 13.0–17.0)
Hemoglobin: 11.7 g/dL — ABNORMAL LOW (ref 13.0–17.0)
Hemoglobin: 8.9 g/dL — ABNORMAL LOW (ref 13.0–17.0)
Hemoglobin: 9.1 g/dL — ABNORMAL LOW (ref 13.0–17.0)
Hemoglobin: 8.6 g/dL — ABNORMAL LOW (ref 13.0–17.0)
MCHC: 33.3 g/dL (ref 30.0–36.0)
MCHC: 33.5 g/dL (ref 30.0–36.0)
MCHC: 34 g/dL (ref 30.0–36.0)
MCHC: 32.5 g/dL (ref 30.0–36.0)
MCHC: 32.9 g/dL (ref 30.0–36.0)
MCV: 89.9 fL (ref 78.0–100.0)
MCV: 90.5 fL (ref 78.0–100.0)
MCV: 90.5 fL (ref 78.0–100.0)
MCV: 91.2 fL (ref 78.0–100.0)
MCV: 91.4 fL (ref 78.0–100.0)
Platelets: 162 10*3/uL (ref 150–400)
Platelets: 185 10*3/uL (ref 150–400)
Platelets: 162 10*3/uL (ref 150–400)
Platelets: 238 10*3/uL (ref 150–400)
RBC: 2.96 MIL/uL — ABNORMAL LOW (ref 4.22–5.81)
RBC: 3.97 MIL/uL — ABNORMAL LOW (ref 4.22–5.81)
RBC: 3.44 MIL/uL — ABNORMAL LOW (ref 4.22–5.81)
RDW: 18.4 % — ABNORMAL HIGH (ref 11.5–15.5)
RDW: 18.5 % — ABNORMAL HIGH (ref 11.5–15.5)
RDW: 18.7 % — ABNORMAL HIGH (ref 11.5–15.5)
RDW: 18.7 % — ABNORMAL HIGH (ref 11.5–15.5)
RDW: 19 % — ABNORMAL HIGH (ref 11.5–15.5)
WBC: 8.3 10*3/uL (ref 4.0–10.5)

## 2010-09-04 LAB — BRAIN NATRIURETIC PEPTIDE
Pro B Natriuretic peptide (BNP): 404 pg/mL — ABNORMAL HIGH (ref 0.0–100.0)
Pro B Natriuretic peptide (BNP): 594 pg/mL — ABNORMAL HIGH (ref 0.0–100.0)

## 2010-09-04 LAB — CROSSMATCH

## 2010-09-04 LAB — BODY FLUID CULTURE: Culture: NO GROWTH

## 2010-09-04 LAB — POCT CARDIAC MARKERS
CKMB, poc: 1.1 ng/mL (ref 1.0–8.0)
Troponin i, poc: <0.05 ng/mL (ref 0.00–0.09)

## 2010-09-04 LAB — PTH, INTACT AND CALCIUM: Calcium, Total (PTH): 7.9 mg/dL — ABNORMAL LOW (ref 8.4–10.5)

## 2010-09-04 LAB — DIFFERENTIAL
Eosinophils Absolute: 0 10*3/uL (ref 0.0–0.7)
Eosinophils Relative: 0 % (ref 0–5)
Lymphocytes Relative: 8 % — ABNORMAL LOW (ref 12–46)
Lymphs Abs: 0.9 10*3/uL (ref 0.7–4.0)
Monocytes Relative: 5 % (ref 3–12)

## 2010-09-04 LAB — CK TOTAL AND CKMB (NOT AT ARMC): CK, MB: 0.9 ng/mL (ref 0.3–4.0)

## 2010-09-04 LAB — HEMOCCULT GUIAC POC 1CARD (OFFICE): Fecal Occult Bld: NEGATIVE

## 2010-09-08 LAB — ABO/RH: ABO/RH(D): O POS

## 2010-09-08 LAB — CROSSMATCH
ABO/RH(D): O POS
Antibody Screen: NEGATIVE

## 2010-10-03 ENCOUNTER — Encounter: Payer: Self-pay | Admitting: Internal Medicine

## 2010-10-06 ENCOUNTER — Encounter: Payer: Self-pay | Admitting: Internal Medicine

## 2010-10-06 ENCOUNTER — Ambulatory Visit (INDEPENDENT_AMBULATORY_CARE_PROVIDER_SITE_OTHER): Payer: BC Managed Care – PPO | Admitting: Internal Medicine

## 2010-10-06 VITALS — BP 120/60 | HR 73 | Ht 69.0 in | Wt 253.0 lb

## 2010-10-06 DIAGNOSIS — G4733 Obstructive sleep apnea (adult) (pediatric): Secondary | ICD-10-CM

## 2010-10-06 DIAGNOSIS — J42 Unspecified chronic bronchitis: Secondary | ICD-10-CM

## 2010-10-06 DIAGNOSIS — J9620 Acute and chronic respiratory failure, unspecified whether with hypoxia or hypercapnia: Secondary | ICD-10-CM

## 2010-10-06 DIAGNOSIS — M109 Gout, unspecified: Secondary | ICD-10-CM | POA: Insufficient documentation

## 2010-10-06 MED ORDER — ALBUTEROL SULFATE HFA 108 (90 BASE) MCG/ACT IN AERS: 2.0000 | INHALATION_SPRAY | Freq: Four times a day (QID) | RESPIRATORY_TRACT | Status: DC | PRN

## 2010-10-06 MED ORDER — ALLOPURINOL 100 MG PO TABS: 100.0000 mg | ORAL_TABLET | Freq: Two times a day (BID) | ORAL | Status: DC

## 2010-10-06 NOTE — Assessment & Plan Note (Signed)
Good compliance and control at CPAP 13. Weight loss would help.

## 2010-10-06 NOTE — Assessment & Plan Note (Signed)
Control has been good with Dulera. We recognize minor seasonal variation with no need for changes, except we will provide a rescue inhaler.

## 2010-10-06 NOTE — Patient Instructions (Signed)
We will call script for Proair rescue inhaler to [facility] in [place], # 1,  Refill prn,   2 puffs. 4 x daily if needed.

## 2010-10-06 NOTE — Progress Notes (Signed)
  Subjective:    Patient ID: [name], male    DOB: 06/18/1933, 77 y.o.   MRN: [id number]  HPI 10/06/10- 52 yoM former smoker followed for sleep apnea, bronchitis, complicated by CAD with hx acute and chronic respiratory failure, diverticular bleed with anemia. Gout. Wife here. Last here July 07, 2010 . A CT had suggested [facility]IC with bronchitis. He had mentioned palpitations last time, but no recent concern.  Gout has flared. On maintenance prednisone 5 mg/day for gout. Occasionally some mild chest congestion but Dulera 200 has controlled him. He would like to have a rescue inhaler.  He continues to use CPAP 13/ SMS all night every night, with O2 for sleep at 3L/M.  Review of Systems Constitutional:   No weight loss, night sweats,  Fevers, chills, fatigue, lassitude. HEENT:   No headaches,  Difficulty swallowing,  Tooth/dental problems,  Sore throat,                No sneezing, itching, ear ache, nasal congestion, post nasal drip,   CV:  No chest pain,  Orthopnea, PND, swelling in lower extremities, anasarca, dizziness, palpitations  GI  No heartburn, indigestion, abdominal pain, nausea, vomiting, diarrhea, change in bowel habits, loss of appetite  Resp: .  No excess mucus, no productive cough,  No non-productive cough,  No coughing up of blood.  No change in color of mucus.  No wheezing.     Skin: no rash or lesions.  GU: no dysuria, change in color of urine, no urgency or frequency.  No flank pain.  MS:  No joint pain or swelling.  No decreased range of motion.  No back pain.  Psych:  No change in mood or affect. No depression or anxiety.  No memory loss.      Objective:   Physical Exam General- Alert, Oriented, Affect-appropriate, Distress- none acute  Skin- rash-none, lesions- none, excoriation- none  Lymphadenopathy- none  Head- atraumatic  Eyes- Gross vision intact, PERRLA, conjunctivae clear, secretions  Ears- Hearing, canals, Tm -normal,  Nose- Clear, No-  Septal dev, mucus, polyps, erosion, perforation   Throat- Mallampati II , mucosa clear , drainage- none, tonsils- atrophic  Neck- flexible , trachea midline, no stridor , thyroid nl, carotid no bruit  Chest - symmetrical excursion , unlabored     Heart/CV- RRR , no murmur , no gallop  , no rub, nl s1 s2                     - JVD- none , edema- none, stasis changes- none, varices- none     Lung- clear to P&A, wheeze- none, cough- none , dullness-none, rub- none     Chest wall-   Abd- tender-no, distended-no, bowel sounds-present, HSM- no  Br/ Gen/ Rectal- Not done, not indicated  Extrem- cyanosis- none, clubbing, none, atrophy- none, strength- nl.  tophae on hands and elbows.  Neuro- grossly intact to observation         Assessment & Plan:

## 2010-10-07 NOTE — Discharge Summary (Signed)
[name], [name]              ACCOUNT NO.:  [id number]   MEDICAL RECORD NO.:  [id number]          PATIENT TYPE:  INP   LOCATION:  2502                         FACILITY:  [facility]   PHYSICIAN:  Nanetta Batty, M.D.   DATE OF BIRTH:  1933-10-29   DATE OF ADMISSION:  01/22/2009  DATE OF DISCHARGE:  01/23/2009                               DISCHARGE SUMMARY   DISCHARGE DIAGNOSES:  1. Severely function-limiting claudication.      a.     Abnormal Doppler suggesting iliac disease.  2. Peripheral vascular disease.      a.     With 70% stenosis to the right renal artery undergoing       percutaneous transluminal angioplasty and stent.  Residual 70%       left renal artery stenosis.      b.     Percutaneous transluminal angioplasty and stent to the right       common iliac artery for 80% stenosis and residual 70% stenosis in       the left common iliac artery.  3. Diabetes mellitus 2.  4. Dyslipidemia.  5. Arthritis, on prednisone.  6. Coronary artery disease with bypass grafting in 1997 and a history      of nondrug-eluting stent placed in his obtuse marginal to saphenous      vein graft, December 2009 which was complicated for radiocontrast      nephropathy requiring dialysis.  7. Obstructive sleep apnea, intolerant to CPAP.  8. Chronic obstructive pulmonary disease with dyspnea on exertion.  9. Severe hip claudication.   DISCHARGE MEDICATIONS:  See medication reconciliation sheet in hospital  computer.   DISCHARGE INSTRUCTIONS:  1. Increase activity slowly.  2. May shower.  3. No lifting for 1 week.  4. No driving for 2 days.  5. Low-sodium heart-healthy diabetic diet.  6. Wash cath site with soap and water.  Call if any bleeding,      swelling, or drainage.  7. Have lab work done on Thursday to check kidneys.  Prescription      given.  8. Follow up with Dr. Allyson Sabal, February 13, 2009 at 2:30 p.m.  9. The patient is also scheduled for renal Dopplers and lower      extremity  arterial Doppler, February 12, 2009 at 2:00 p.m.   PROCEDURE:  PV arteriogram by Dr. Allyson Sabal, January 22, 2009 with PTA and  stent to the right renal artery and to the right CIA.   HOSPITAL COURSE:  [name] came in electively as an outpatient before  PV arteriogram secondary to abnormal Dopplers and lifestyle-limiting  claudication with abnormal Dopplers.  The patient underwent procedure  with results as previously stated.  He was given IV fluids overnight as  well as Mucomyst and he was given bicarb IV solution prior and post  procedure to protect his kidneys.  Post-procedure his ACE and ARB were  held prior to and immediately after his cath.  His diuretic Lasix is  held until Thursday.  His creatinine was actually improved at discharge.   Please note, blood pressure was elevated prior to  sheath pulling after  his procedure and he was placed on IV nitroglycerin drip to control  blood pressure as he has multiple medication allergies and intolerances.   By January 23, 2009, he ambulated in the hall without further  complications and was stable and ready for discharge home.  He will  follow up as an outpatient.   ALLERGIES:  ARICEPT, LOPRESSOR, SULFA, and HEPARIN.  Please note,  intolerant or unable to take heparin and we used Refludan.  He also  allergic to MORPHINE, ZINACEF, and HYDRALAZINE.   DISCHARGE VITAL SIGNS:  Blood pressure 144/63, pulse 56, respirations  18, temperature 97.8, and oxygen saturation on room air 95%.  Accu-Cheks  were slightly up in the evening at 264 but in the morning 132.   DISCHARGE LABORATORY DATA:  Hemoglobin 11.4, hematocrit 33.3, WBC 5.7,  platelets 113, and MCV 96.3.  Chemistry:  Sodium 141, potassium 4.3,  chloride 101, CO2 33, BUN 21, creatinine 1.27, glucose 150, and calcium  8.7.   FOLLOWUP:  The patient will follow up as instructed and labs will be  checked.      Darcella Gasman. Annie Paras, N.P.      Nanetta Batty, M.D.  Electronically  Signed    LRI/MEDQ  D:  01/23/2009  T:  01/24/2009  Job:  [id number]   cc:   [name], M.D.

## 2010-10-07 NOTE — Discharge Summary (Signed)
[name], [name]              ACCOUNT NO.:  [id number]   MEDICAL RECORD NO.:  [id number]          PATIENT TYPE:  INP   LOCATION:  3743                         FACILITY:  [facility]   PHYSICIAN:  Sheliah Mends, MD      DATE OF BIRTH:  1933/08/25   DATE OF ADMISSION:  05/05/2008  DATE OF DISCHARGE:  05/20/2008                               DISCHARGE SUMMARY   ADDENDUM:  When discharging the patient, review of the chart revealed  that the patient had been getting some subcu insulin.  His glipizide was  held because of his renal status and decreased appetite and  hypoglycemia.  It was decided at the time of discharge to restart his  glipizide at 5 mg once a day.  He had previously been on 15 mg b.i.d.  It was decreased because his creatinine clearance was 42.2, and as an  outpatient Dr. Jeannetta Nap can increase his medications or adjust them as  needed.  He was told to check his blood sugars twice a day before  breakfast, and the last meal of the day and to write them down and take  them to Dr. Jeannetta Nap in about a week's time.  We will check a BMET and a  CBC this week, which should be faxed to Dr. Elsie Lincoln, Dr. Marina Goodell, and Dr.  Jeannetta Nap.  Also, he was told that he can restart his Lasix 40 mg once a  day if he has any swelling.      Lezlie Octave, N.P.      Sheliah Mends, MD  Electronically Signed    BB/MEDQ  D:  05/20/2008  T:  05/21/2008  Job:  [id number]   cc:   Dr. Jeannetta Nap  Dr. Babette Relic, M.D.  [facility]

## 2010-10-07 NOTE — Cardiovascular Report (Signed)
Thomas, Knox              ACCOUNT NO.:  [id number]   MEDICAL RECORD NO.:  [id number]          PATIENT TYPE:  INP   LOCATION:  2807                         FACILITY:  [facility]   PHYSICIAN:  Madaline Savage, M.D.DATE OF BIRTH:  Jan 06, 1934   DATE OF PROCEDURE:  05/11/2008  DATE OF DISCHARGE:                            CARDIAC CATHETERIZATION   PROCEDURES PERFORMED:  1. Selective coronary angiography by Judkins technique.  2. Retrograde left heart catheterization.  3. No left ventricular angiogram was performed because of the      patient's creatinine.  Successful percutaneous coronary artery      stenting of a saphenous vein graft obtuse marginal branch #1      requiring 2 non-drug-eluting stents.   PATIENT PROFILE:  [name] is a 77 year old white married male who is  diabetic, who is obese, who has had previous coronary artery bypass  grafting approximately 13 or 14 years ago who has presented to the  hospital on May 05, 2008, with chest pain.  His cardiac enzymes  have risen with a troponin peak at 0.30, but because of renal  insufficiency the patient was not immediately catheterized.  We were  able to control his symptomatology with medication.  Because of his  myocardial infarction, he has been stabilized but it was felt that we  needed to proceed with cardiac catheterization, so he presents today for  that purpose.  Today's catheterization was technically difficult and  probably discomforting for the patient, but the result was good.  The  results are described below.  Angiographic results are as follows.  The  patient's left main coronary artery is ostial stenosed 90%.  There is  proximal coronary calcification in the LAD.   The LAD was suboptimally visualized but dye load was an issue, so we did  not visualize the LAD in multiple projections.   The right coronary artery had only mild luminal irregularities  throughout.  No high-grade stenoses.   The  saphenous vein graft to the obtuse marginal branch contained a 90%  hypodense stenosis which is probably the patient's culprit vessel for  his infarct and for his chest pain.  The distal runoff in this vessel is  very good.   The left internal mammary artery graft to the LAD is widely patent  proximally after the insertion site of the LIMA into the LAD.  There are  lesions of approximately 60% in 2 areas, one in the proximal LAD and a  second in the mid LAD.  These lesions are stable and did not warrant  intervention at this time.   The diseased saphenous vein graft to the obtuse marginal branch #1 was  very difficult but was finally accomplished by coronary artery stenting  which was accomplished by direct stenting, no predilatation with balloon  angioplasty.  Initially, it was thought that we could cover the stenotic  areas with a single stent and that stent was deployed.  It was a 3.5 x  23 mm Multi-Link Vision stent.  After deploying it, we realized that  there was yet another area that needed to be covered,  so a second Multi-  Link Vision stent that was 3.5 x 12 was then deployed.  Therefore, feel  that the 2 overlap stents represent a length of 32-35 mm.  Distal flow  was well preserved.  We used a distal protection device, called a spider  for this case.  Following the successful deployment of stents, we moved  the patient out of the cardiac cath lab and took him to the holding  area.  We will now await the state of his creatinine which will likely  increase as result of his dye load which was 285 mL.  As you will see in  the patient's progress notes, we have talked to the family and the  patient at least twice about the possibility of contrast nephropathy  occurring.   FINAL IMPRESSIONS:  1. Recent unstable coronary syndrome with myocardial infarction.  2. Culprit vessel found to be obtuse marginal branch #1 saphenous vein      graft stenosis of 90%.  3. Successful  percutaneous stenting of the obtuse marginal branch #1      with reduction of a 95% stenosis to 0% residual with preservation      of TIMI III flow.   PLAN:  The patient received Angiomax during the case.  He has heparin-  induced thrombocytopenia and could not receive heparin.  We will  continue the Refludan for a short while while the patient recovers and  the Angiomax will be discontinued.           ______________________________  Madaline Savage, M.D.     WHG/MEDQ  D:  05/11/2008  T:  05/12/2008  Job:  [id number]   cc:   [facility]

## 2010-10-07 NOTE — Consult Note (Signed)
[name], [name]              ACCOUNT NO.:  [id number]   MEDICAL RECORD NO.:  [id number]          PATIENT TYPE:  INP   LOCATION:  2010                         FACILITY:  [facility]   PHYSICIAN:  James L. Knox, M.D.DATE OF BIRTH:  [birth year]   DATE OF CONSULTATION:  08/08/2008  DATE OF DISCHARGE:                                 CONSULTATION   REASON FOR CONSULTATION:  Chronic kidney disease.   PRIMARY NEPHROLOGIST:  Wilber Bihari. Caryn Section, MD   ATTENDING PHYSICIAN IN PRIMARY TEAM:  Ollen Gross. Vernell Morgans, MD   HISTORY OF PRESENT ILLNESS:  [name] is a 77 year old male with past  medical history of chronic kidney disease, one episode of acute renal  failure recently in December 2009, which was secondary to contrast  exposure from which he recovered well.  Past medical history also of  diabetes, COPD, coronary artery disease, and gout.  [name] was  admitted on August 05, 2008, with right upper quadrant pain, nausea, and  vomiting.  Cardiac enzymes were negative.  No EKG changes.  Workup has  revealed cholelithiasis and some pericholecystic inflammatory change.  Plan is for cholecystectomy tomorrow.  He has also been found to have  volume overload with an increase BNP as well as pulmonary edema and  effusion seen on chest CT.  Renal team was consulted to continue care of  this patient with chronic kidney disease.   PAST MEDICAL HISTORY:  1. Chronic kidney disease with a baseline creatinine of 2.5, acute      renal injury secondary to contrast exposure in December 2009.  2. Diabetes type 2.  3. Hypertension.  4. Spinal stenosis with neuropathy.  5. COPD on home oxygen 3 liters continuously.  6. Gout.  7. Paroxysmal atrial fibrillation.  8. Coronary artery disease, status post CABG in 1997, PCI in 2006, PCI      in December 2009.  9. History of bradycardia with beta-blockers.  10.History of anemia of chronic disease.  11.History of positive HIT screen.  12.Diverticular bleed in  December 2009.  13.Question of mild Alzheimer-type dementia.   MEDICATIONS:  1. Hydralazine 25 mg t.i.d.  2. Bystolic 2.5 mg daily.  3. Protonix 40 mg a day b.i.d.  4. Lexapro 10 mg daily.  5. Glipizide 10 mg daily.  6. Lasix 40 mg daily.  7. Colchicine 0.6 mg daily.  8. Actos 15 mg daily.  9. Gabapentin 100 mg at bedtime.  10.Lipitor 40 mg daily.  11.Spiriva.  12.Multivitamin.  13.Folate.  14.Zetia 10 mg daily.  15.Tylenol Arthritis twice a day.   SOCIAL HISTORY:  [name] lives in [place] with his wife.  He is a  retired Building services engineer.  He has a 50 pack-year smoking history.  He quit in  1992.  He does not drink alcohol or smoke cigarettes.   FAMILY MEDICAL HISTORY:  Noncontributory.   REVIEW OF SYSTEMS:  Positive for fatigue, decreased appetite, nausea,  vomiting, and diarrhea.  Positive for cough and wheezing.  He has had no  shortness of breath, no dyspnea on exertion.   PHYSICAL EXAMINATION:  VITAL SIGNS:  Temperature  97.1,  pulse 60,  respirations 20, blood pressure 110/47, and oxygen saturation 98% on 2  liters.  GENERAL:  The patient is in no acute distress.  HEENT:  Head is normocephalic, atraumatic.  Pupils equal, round, and  reactive.  Extraocular motions intact.  Sclerae are clear.  Mucous  membranes are moist.  NECK:  Supple with no JVD, bruit, or lymphadenopathy.  CARDIOVASCULAR:  Regular rate and rhythm.  Distant heart sounds 2/6  systolic ejection murmur.  LUNGS:  Bibasilar crackles with fair air movement.  ABDOMEN:  Soft.  There is mild tenderness to palpation with deep  palpation of the right upper quadrant.  There is no rebound, no  guarding.  Abdomen distended.  Bowel sounds are increased.  EXTREMITIES:  There is trace pedal edema bilaterally of right greater  than left.  Right knee tender to palpation slightly swollen.  Pulses are  1+ bilaterally.  NEUROLOGIC:  The patient is alert and oriented x4.  Cranial nerves are  intact.  Neurologic exam is  nonfocal.   LABORATORY DATA:  White blood cells 10.9, hemoglobin 8.9, hematocrit  26.6, platelets 162.  Sodium 141, potassium 4.6, chloride 106, bicarb  28, BUN 51, creatinine 2.35, glucose 190, calcium 8.6, albumin 2.6.  AST  15, ALT 12, BNP is 1236.  Uric acid level is 19.6.  Synovial fluid  aspiration is 41,000 white blood cells with urate crystals.  Chest x-ray  on August 08, 2008, shows cardiomegaly with diffuse pleural edema.   ASSESSMENT AND PLAN:  1. Chronic renal insufficiency: stage III chronic kidney disease.  We      will get records from Dr. Scherrie Gerlach office where he was last seen      approximately 1 week ago.  Current creatinine is less than      baseline.  He has good urine output.  The patient definitely has      volume overload with pulmonary edema and bilateral pleural      effusions.  Okay to increase diuresis at this point especially with      transfusion would be careful with soft blood pressure.  We will      discontinue IV Lasix give 80 mg Lasix p.o. b.i.d.  We will check      PTH.  2. Anemia.  There are multiple possible etiologies for this anemia.      Most likely is anemia of chronic disease.  The patient has had a      recent diverticular bleed and may be experiencing chronic blood      loss.  We will look for outpatient iron studies.  We will start      Aranesp at this time and start iron repletion based on iron      studies.  We would heme check stool.  3. Hypotension.  The patient has a history of hypertension and is on      multiple antihypertensive especially as we are increasing his      diuresis, but hold parameters and decrease his antihypertensive.  4. Chronic obstructive pulmonary disease.  The patient has baseline      decreased lung function and current volume overload with pulmonary      edema.  We would monitor respiratory function very carefully and      adjust diuresis as needed.  5. Gout.  Increased uric acid level to 19.6.  The patient is at  risk      for uric acid nephropathy.  We will start  allopurinol at this time      at 100 mg p.o. daily for 1 week.  We would increase by 100 mg      weekly as tolerated.  6. Diabetes.  We would continue sliding scale insulin and monitor CBGs      while in hospital.      Elby Showers, MD  Electronically Signed     ______________________________  [name], M.D.    CW/MEDQ  D:  08/08/2008  T:  08/09/2008  Job:  [id number]

## 2010-10-07 NOTE — H&P (Signed)
[name], [name]              ACCOUNT NO.:  [id number]   MEDICAL RECORD NO.:  [id number]          PATIENT TYPE:  EMS   LOCATION:  MAJO                         FACILITY:  [facility]   PHYSICIAN:  Marcellus Scott, MD     DATE OF BIRTH:  04-11-1934   DATE OF ADMISSION:  08/20/2007  DATE OF DISCHARGE:                              HISTORY & PHYSICAL   PRIMARY CARE PHYSICIAN:  [name], M.D.   CHIEF COMPLAINT:  Productive cough, dyspnea, pleuritic chest pain, and  chills.   HISTORY OF PRESENT ILLNESS:  [name] is a pleasant 77 year old  Caucasian male patient with extensive past medical history as indicated  below. He indicates that for the last 2 to 3 months, he has been having  intermittent cough. He was told to have bronchitis. He has been treated  with a course of antibiotics a couple of weeks ago. He was said to be  doing okay until approximately a week ago, when again he started having  a cough productive of blackish colored sputum. Over the next few days,  his cough has progressively gotten worse and hacking. This is associated  with bilateral lower anterior chest pain on coughing and dyspnea,  wheezing, occasional tightness of the chest, chills but no fevers or  rigors. For these symptoms, the patient presented to the primary care  physician yesterday and was told again to have bronchitis versus early  pneumonia and was prescribed some cough medications but no antibiotics.  He was advised to return today if he got worse. However, patient  indicates that he was unable to sleep because of the persistent hacking  cough and decided to come to the emergency room for further evaluation  and management. Since being in the emergency department, patient says  that he is feeling much better and able to breath much easier.   PAST MEDICAL HISTORY:  1. Presyncopal attack/vasovagal.  2. Lumbar spine stenosis with radiculopathy and chronic pain.  3. Coronary artery disease.  4.  Hypertension.  5. Hyperlipidemia.  6. Type 2 diabetes mellitus.  7. Paroxysmal atrial fibrillation.  8. Chronic obstructive pulmonary disease, not on home oxygen.  9. Gastroesophageal reflux disease.  10.Chronic kidney disease with baseline creatinine of 2.7.  11.Gout.  12.Peripheral vascular disease.  13.Alzheimer's dementia.  14.Myoview in February 2008 with no ischemia and ejection fraction of      59%.  15.Obesity.   PAST SURGICAL HISTORY:  1. Coronary artery bypass graft in 1997.  2. Right coronary stent in 2006.  3. Bilateral cataract surgery.  4. Right sided back cyst removal.   ALLERGIES:  SULFA DRUGS.   MEDICATIONS:  1. Colchicine 0.6 mg p.o. daily.  2. Verapamil 240 mg p.o. daily.  3. Zetia 10 mg p.o. daily.  4. Lexapro 10 mg p.o. daily.  5. Glipizide 10 mg p.o. b.i.d.  6. Folic acid 400 mg p.o. daily.  7. Multivitamins with Lycopene 1 daily.  8. Aspirin 81 mg p.o. daily.  9. Fish oil with Omega 3, 2000 mg p.o. daily.  10.Niacin 1000 mg p.o. b.i.d.  11.Benicar 40 mg p.o. q.h.s.  12.Lipitor 40 mg p.o. q.h.s.  13.Terazosin 2 mg p.o. every other day.  14.Gabapentin 100 mg p.o. q.h.s.  15.Furosemide 40 mg p.o. q.h.s.  16.Omeprazole 40 mg p.o. p.m.'s.  17.Fish oil with Omega 3, 2000 mg p.o. nightly.  18.Spiriva inhaler 80 mcg daily.  19.Trilipix 45 mg p.o. nightly.   FAMILY HISTORY:  The patient's parents died of myocardial infarction's.  Father at age 5 and mother at age 15. Mother also had diabetes  mellitus.   SOCIAL HISTORY:  The patient is married and lives with his wife. He has  limited ambulation secondary to his chronic low back pain but does not  use a walker or a cane. He says he is unsteady on his gait and tends to  fall. The patient has 50 year smoking history of approximately 2 1/2  packs per day and quit in 1992. NO history of alcohol or drug abuse.   REVIEW OF SYSTEMS:  14 systems reviewed and apart from history of  present illness is  noncontributory.   PHYSICAL EXAMINATION:  GENERAL:  [name] is a moderately built and  obese male patient in no obvious distress.  VITAL SIGNS:  Temperature 99.2 degrees Fahrenheit, blood pressure 141/62  mmHg, pulse 74 and regular, respiratory rate 20 per minute. Saturating  at 97%.  HEENT:  Normocephalic and atraumatic. Pupils are equal, round, and  reactive to light and accommodation. Oral cavity with dry mucosa but no  oropharyngeal erythema or thrush.  NECK:  Supple. No JVD or carotid bruit.  LYMPH:  No lymphadenopathy.  RESPIRATORY:  Midline sternotomy scar. Decreased breath sounds in the  bases with crackles bilateral bases, right greater than the left.  Occasional rhonchi posteriorly but no wheezing.  CARDIOVASCULAR:  First and second heart sounds heard. Question short 2/6  ejection systolic murmur at the apex.  ABDOMEN:  Protuberant. Non-tender. No organomegaly or mass appreciated.  Bowel sounds are normally heard.  NEUROLOGIC:  Awake, alert, and oriented x3. No focal neurological  deficits.  EXTREMITIES:  Trace bilateral ankle edema. Hyperpigmented scaring in  bilateral lower legs. Diminished bilateral dorsalis pedis and posterior  tibial pulsations.  SKIN:  Without any rashes.   LABORATORY DATA:  BNP of 352. CBC with hemoglobin and hematocrit 12.3  and 35.6. White blood cell count 10.7. Platelets 183,000. Point of care  cardiac markers not indicative of myocardial infarction. Electrolytes  remarkable for glucose of 157, BUN 41, creatinine 2.9. Ionized calcium  of 1.10.   Chest x-ray has been reviewed by me and reported as lower lobe  pneumonia, probably on the left and also possibly on the right as well.   ASSESSMENT/PLAN:  1. Community acquired pneumonia, left lower lobe, plus or minus      bilateral lower lobe pneumonia. Will admit patient to the hospital.      Will obtain blood cultures and sputum cultures. Will also check flu      antigens A & B and Urine  Legionella antigen. Will empirically treat      with IV Ceftriaxone and Zithromax. Will also provide Mucinex and      Robitussin DM. CXR to be followed to resolution after treatment.  2. Chronic obstructive pulmonary disease with minimal bronchospasm.      The patient has improved with nebulization treatment. Will continue      nebs and O2.  3. Dehydration. Will hold Lasix for today and briefly hydrate with IV      fluids.  4. Coronary artery disease. Asymptomatic  of cardiac type chest pain.      To continue home medications.  5. Hypertension. Continue home medications.  6. Diabetes mellitus. Will check hemoglobin A1C.      Marcellus Scott, MD  Electronically Signed     AH/MEDQ  D:  08/20/2007  T:  08/20/2007  Job:  [id number]   cc:   [name], M.D.  Madaline Savage, M.D.  Clinton D. Maple Hudson, MD, FCCP, FACP

## 2010-10-07 NOTE — H&P (Signed)
NAMEPERSEUS, WESTALL              ACCOUNT NO.:  [id number]   MEDICAL RECORD NO.:  [id number]          PATIENT TYPE:  INP   LOCATION:  1824                         FACILITY:  [facility]   PHYSICIAN:  Ollen Gross. Vernell Morgans, M.D. DATE OF BIRTH:  21-Jul-1933   DATE OF ADMISSION:  08/04/2008  DATE OF DISCHARGE:                              HISTORY & PHYSICAL   [name] is a 77 year old white male, who has been having right upper  quadrant pain for about the last day or so.  The pain has been fairly  severe in nature form.  The pain has been associated with some nausea  and vomiting.  He denies any current chest pain or shortness of breath,  although he has some significant cardiac history.  He also states that  he did have an MI in December 2009 and had 2 stents placed by his  cardiologist, Dr. Elsie Lincoln.   PAST MEDICAL HISTORY:  Significant for:  1. Anemia.  2. Chronic GI bleed.  3. Asthma.  4. Bronchitis.  5. COPD.  6. Chronic renal failure.  7. Coronary artery disease.  8. Congestive heart failure.  9. Depression.  10.Diabetes.  11.Gout.  12.Hypertension.   PAST SURGICAL HISTORY:  Significant for coronary artery bypass grafting,  hand surgery, and cataract surgery.   MEDICATIONS:  Aspirin, Benicar, colchicine, Exelon, fish oil, folate,  furosemide, glipizide, Lexapro, Lipitor, niacin, omeprazole, Spiriva,  terazosin, TriCor, verapamil, and Zetia.   ALLERGIES:  SULFA and HEPARIN.   SOCIAL HISTORY:  He denies use of alcohol or tobacco products.   FAMILY HISTORY:  Noncontributory.   PHYSICAL EXAMINATION:  VITAL SIGNS:  Temperature is 97.3, blood pressure  144/59, and pulse 53.  GENERAL:  He is an elderly white male in no acute distress, but appears  uncomfortable.  SKIN:  Warm and dry.  No jaundice.  HEENT:  Eyes, extraocular muscles intact.  Pupils are equal, round, and  reactive to light.  Sclerae nonicteric.  LUNGS:  Clear bilaterally with no use of accessory inspiratory  muscles.  HEART:  Regular rate and rhythm with impulse in left chest.  ABDOMEN:  Soft with some moderate right upper quadrant tenderness and  some guarding, but no peritonitis.  No palpable mass or  hepatosplenomegaly.  EXTREMITIES:  No cyanosis, clubbing, or edema.  Good strength in his  arms and legs.  PSYCHOLOGIC:  He is alert and oriented x3 with no evidence of anxiety or  depression.   On review of his lab work, it was significant for white count of 10.7.  His liver functions looked okay.  Lipase was okay.  UA was negative.  His ultrasound did show stones in the gallbladder, but no real  gallbladder wall thickening or ductal dilatation, but he does have a  positive Murphy sign.   ASSESSMENT AND PLAN:  This is a 77 year old white male with some  significant cardiac history including recent myocardial infarction, who  also has evidence of gallstones with some possible cholecystitis.  We  will plan to admit him to the hospital for pain control and antibiotics.  We will consult Dr.  Gamble, his cardiologist.  Given his recent  myocardial infarction, he may require cholecystostomy tube decompression  to help manage his gallbladder as he may be to have risks for surgery at  this point in time.      Ollen Gross. Vernell Morgans, M.D.  Electronically Signed     PST/MEDQ  D:  08/05/2008  T:  08/05/2008  Job:  [id number]

## 2010-10-07 NOTE — Discharge Summary (Signed)
NAMEHAMILTON, [name]              ACCOUNT NO.:  [id number]   MEDICAL RECORD NO.:  [id number]          PATIENT TYPE:  INP   LOCATION:  4712                         FACILITY:  [facility]   PHYSICIAN:  Marcellus Scott, MD     DATE OF BIRTH:  Aug 27, 1933   DATE OF ADMISSION:  08/20/2007  DATE OF DISCHARGE:  08/22/2007                               DISCHARGE SUMMARY   FAMILY MEDICAL DOCTOR:  Dr. Jeannetta Nap   DISCHARGE DIAGNOSES:  1. Community-acquired pneumonia.  2. Chronic obstructive pulmonary disease.  3. ? acute bronchitis.  4. Dehydration.  5. Coronary artery disease.  6. Hypertension.  7. Type 2 diabetes mellitus.  8. Chronic kidney disease at baseline.  9. Anemia.  10.Mild transaminitis.  11.Possible diastolic heart failure.   DISCHARGE MEDICATIONS:  1. Avelox 400 mg p.o. daily for an additional 4 days.  2. Mucinex 600 mg p.o. b.i.d.  3. Robitussin DM 5 mL p.o. q. 4 hourly p.r.n.  4. Albuterol 90 mcg per spray MDI 2 puffs inhaled q. 4-6 hourly p.r.n.  5. Colchicine 0.6 mg p.o. every other day.  6. Verapamil 240 mg p.o. daily.  7. Zetia 10 mg p.o. daily.  8. Lexapro 10 mg p.o. daily.  9. Glipizide 10 mg p.o. twice daily.  10.Folic acid 400 mg p.o. daily.  11.Multivitamins 1 p.o. daily.  12.Aspirin 81 mg p.o. daily.  13.Fish oil with omega-3  14.Niacin 1000 mg p.o. twice daily.  15.Benicar 40 mg p.o. nightly.  16.Lipitor 40 mg p.o. nightly.  17.Terazosin 2 mg p.o. every other day.  18.Gabapentin 100 mg p.o. nightly.  19.Furosemide 40 mg p.o. daily.  20.Omeprazole 40 mg p.o. daily.  21.Spiriva HandiHaler 18 mcg inhaled daily.  22.Trilipix 45 mg p.o. nightly.  23.Benadryl 25 mg p.o. q. 6 hourly p.r.n.   PROCEDURES:  1. Chest x-ray done today.  Impression:  Improvement in aeration      bilateral lower lobe with persistent residual mild atelectasis or      infiltrate.  No new infiltrates.  2. Chest x-ray on the 28th of March.  Impression:  Lower lobe      pneumonia, probably  on the left and also possibly on the right as      well.   PERTINENT LABORATORIES:  Blood cultures are negative to date.  Basic  metabolic panel with BUN 43, creatinine 2.51.  CBC is with hemoglobin  10, hematocrit 28.7, white blood cells 7.4, platelets 166, MCVs 93.2,  lipid panel with cholesterol 110, triglycerides 219, HDL 30, LDL 36,  VLDL 44, hemoglobin A1C of 7.8, TSH of 1.735, influenza A and B virus  antigen negative, BNP of 352.  Point of care cardiac markers negative.   CONSULTATIONS:  None.   HOSPITAL COURSE AND PATIENT DISPOSITION:  Please refer to the history  and physical note for initial admission details.  In summary, [name]  is a 77 year old male patient with extensive past medical history who  had been having intermittent cough for the last 2-3 months and presented  with worsening cough and dyspnea.  Further evaluation in the emergency  room revealed low-grade  temperature and chest exam and radiological  findings suggestive of a community-acquired pneumonia.  Patient was  thereby admitted for further evaluation and management.  1. Community-acquired pneumonia, left lower lobe versus bilateral      lower lobe.  The patient was admitted to the hospital.  He was      empirically started on IV ceftriaxone and Zithromax.  With these      measures, patient has made significant improvement with his cough      getting much better and dyspnea almost resolved.  Patient will be      discharged on oral Avelox to complete a total week's course of      antibiotics.  To follow up chest x-ray to ensure resolution in a      couple of weeks' time.  2. Chronic obstructive pulmonary disease with minimal bronchospasm on      admission, resolved with nebulizations, oxygen and antibiotics.  3. Question acute bronchitis.  Management as above.  4. Dehydration.  Lasix was briefly held and was briefly IV hydrated.      Dehydration has resolved.  5. Coronary artery disease which is  asymptomatic.  Patient is going to      continue home medications.  6. Hypertension.  Controlled.  7. Diabetes which has been poorly controlled as an outpatient.  To      consider adding next agent as an outpatient.  8. Chronic kidney disease which is at baseline.  9. Anemia- to consider outpatient workup and followup as deemed      necessary which may include a gastrointestinal workup.  10.Mild transaminitis which may be secondary to his anticholesterol      medications, to follow up outpatient hepatic panel.   Patient has some gait problems and indicated on admission that he is  unsteady and tends to fall.  We will get a physical therapy/occupational  therapy evaluation prior to discharge today.     Marcellus Scott, MD  Electronically Signed    AH/MEDQ  D:  08/22/2007  T:  08/22/2007  Job:  [id number]   cc:   [name], M.D.

## 2010-10-07 NOTE — H&P (Signed)
NAMEJUDE, [name]              ACCOUNT NO.:  [id number]   MEDICAL RECORD NO.:  [id number]          PATIENT TYPE:  INP   LOCATION:  1824                         FACILITY:  [facility]   PHYSICIAN:  Elliot Cousin, M.D.    DATE OF BIRTH:  18-May-1934   DATE OF ADMISSION:  12/30/2006  DATE OF DISCHARGE:                              HISTORY & PHYSICAL   PRIMARY CARE PHYSICIAN:  Dr. [name].   PRIMARY CARDIOLOGIST:  Dr. Chanda Busing.   PULMONOLOGIST:  Dr. Jetty Duhamel.   CHIEF COMPLAINT:  I passed out.   HISTORY OF PRESENT ILLNESS:  The patient is a 77 year old man with a  past medical history significant for coronary artery disease, status  post CABG, type 2 diabetes mellitus, asthmatic bronchitis, and chronic  lumbar spinal stenosis.  He presents to the emergency department with a  complaint of passing out last night.  At approximately 8 p.m. last  night, the patient was making preserves.  He experienced a sharp pain in  his left leg.  He sat down for a few minutes.  He suddenly became  lightheaded and clammy.  Everything went black for a few seconds.  He  was about to fall off the stool; however, he held on and did not fall.  He regained consciousness completely.  He sat there for a while.  After  he composed himself, he called for his wife.  The patient did have  associated nausea and lightheadedness, but no spinning dizziness,  vomiting, headache, chest pain, shortness of breath, or palpitations.  He has acute and chronic low back pain radiating to both legs, generally  worse on the left.  He undergoes spinal injections for the chronic pain.  The patient denies any recent new medications. He denies low blood  sugars at home recently. He denies any recent nausea, vomiting or  diarrhea.   During the evaluation in the emergency department, the patient is noted  to be hemodynamically stable, although at one point he was bradycardic  with a heart rate of 52 beats per minute.   The CT scan of his head  reveals no acute findings.  His EKG reveals normal sinus rhythm with  nonspecific T-wave abnormalities and a heart rate of 62 beats per  minute.  His lab data are significant for a creatinine of 3.2 and a BUN  of 42.  The patient will be admitted for further evaluation and  management.   PAST MEDICAL HISTORY:  1. Coronary artery disease with a history of CABG in 1997.  2. Status post stenting of the right coronary artery in 2006.  3. Cardiolite/Myoview in February 2008 revealed no ischemia and an      ejection fraction estimated to be 69%.  4. Hypertension.  5. Hyperlipidemia.  6. Type 2 diabetes mellitus.  7. Asthmatic bronchitis.  8. Gastroesophageal reflux disease.  9. Chronic kidney disease with a baseline creatinine of 2.7.  10.Gout.  11.Chronic lumbar spinal stenosis with chronic bilateral lower      extremity radiculopathy.  12.Dementia.  13.Polypharmacy.   MEDICATIONS:  1. Colchicine 0.6 mg daily.  2. Verapamil 240 mg daily.  3. Zetia 10 mg daily.  4. Lexapro 10 mg daily.  5. Glipizide 10 mg daily.  6. TriCor 145 mg daily.  7. Folic acid 400 mg daily.  8. Multivitamin once daily.  9. Aspirin 81 mg daily.  10.Fish oil/omega-3 1000 mg daily.  11.Niacin 500 mg two tablets daily.  12.Exelon patch 4.6 mg.  13.Benicar 40 mg daily.  14.Glipizide 10 mg b.i.d.  15.Lipitor 40 mg daily.  16.Terazosin 2 mg daily.  17.Gabapentin 100 mg daily.  18.Furosemide 40 mg daily.  19.Omeprazole 20 mg two tablets daily.  20.Spiriva inhaler 18 mcg one inhalation daily.   ALLERGIES:  The patient has an allergy to SULFA DRUGS.   SOCIAL HISTORY:  The patient is married.  [place]  He has 6 children.  One son died at birth.  He stopped  smoking in 1992.  He denies alcohol and illicit drug use.  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   FAMILY HISTORY:  Both of his parents died of heart attacks.   PHYSICAL EXAMINATION:  VITAL SIGNS:  Temperature  96.2, blood pressure  151/61, oxygen saturation 96% on room air, heart rate 63, repeated at  52.  GENERAL:  The patient is a pleasant, obese, alert 77 year old Caucasian  man who is currently lying in bed in no acute distress.  HEENT:  Head is normocephalic and nontraumatic.  Pupils are equal, round  and reactive to light.  Extraocular movements are intact.  Conjunctivae  are clear.  Sclerae are white.  Nasal mucosa is mildly dry.  No sinus  tenderness.  Oropharynx reveals mildly dry mucous membranes.  Full set  of dentures.  No posterior exudates or erythema.  NECK:  Supple.  No adenopathy, no thyromegaly, no JVD.  There are mild  radiating murmurs versus bruits bilaterally.  HEART:  S1 and S2 with a 2./6 systolic murmur.  CHEST WALL:  Well-healed sternotomy scar.  LUNGS:  Decreased breath sounds in the bases, otherwise clear.  ABDOMEN:  Obese.  Positive bowel sounds.  Soft, nontender and non-  distended.  No hepatosplenomegaly.  No masses palpated.  EXTREMITIES:  Trace of pedal pulses bilaterally.  No pretibial edema.  Well-healed harvesting scar, right lower extremity.  NEUROLOGIC:  The patient was initially asleep, but he became alert and  oriented x2.  Cranial nerves II-XII are intact.  Strength:  The patient  is able to raise his right leg against gravity approximately 25 degrees  and his left leg against gravity approximately 10 degrees with some  discomfort.  Sensation grossly deficient over the plantar surfaces  bilaterally.   ADMISSION LABORATORY DATA:  WBC 7.3, hemoglobin 12.6, platelets 180,000.  Sodium 139, potassium 4.6, chloride 105, BUN 42, creatinine 3.2,  bicarbonate 28.  CK-MB 2.4, myoglobin 4.6, troponin I less than 0.05.  Sed rate 7.   ASSESSMENT:  1. Syncope.  The exact etiology is unclear.  The patient appears to be      back to baseline.  Other than chronic lumbar radiculopathy, there      are no focal neurological deficits.  The patient may have       experienced a vasovagal reaction from pain.  He may have become      orthostatic as well.  The patient is treated with multiple      medications which could possibly lead to transient syncope.  2. Acute on chronic renal insufficiency.  The patient's creatinine was  2.7 in February 2008.  Today, it is 3.2.  3. Bradycardia.  More than likely, the patient's bradycardia is      secondary to verapamil.  4. Type 2 diabetes mellitus.  The patient's venous glucose is mildly      elevated.  He gives no recent history of low blood sugars at home.  5. Coronary artery disease.  6. Chronic lumbar spinal stenosis with bilateral lower extremity      numbness and weakness.  The patient is followed by pain management      physician, Dr. Vear Clock.   PLAN:  1. The patient will be admitted for further evaluation and management.  2. We will start a trial of IV fluids.  3. Strict I's and O's and follow renal function.  4. We will hold Lasix, Benicar, Lexapro and Exelon.  5. For further evaluation, we will check orthostatic vitals, cardiac      enzymes, urinalysis, TSH, hemoglobin A1c, and vitamin B12.  6. We will also check carotid Dopplers.      Elliot Cousin, M.D.  Electronically Signed     DF/MEDQ  D:  12/30/2006  T:  12/30/2006  Job:  [id number]   cc:   [name], M.D.  Madaline Savage, M.D.  Clinton D. Maple Hudson, MD, FCCP, FACP

## 2010-10-07 NOTE — Procedures (Signed)
NAMEODARIUS, [name]              ACCOUNT NO.:  [id number]   MEDICAL RECORD NO.:  [id number]          PATIENT TYPE:  AMB   LOCATION:  SDS                          FACILITY:  [facility]   PHYSICIAN:  Nanetta Batty, M.D.   DATE OF BIRTH:  1934/05/17   DATE OF PROCEDURE:  DATE OF DISCHARGE:                    PERIPHERAL VASCULAR INVASIVE PROCEDURE   PROCEDURES:  Arch aortogram, selective right and left subclavian artery  angiogram.   Mr. Sontag is a 77 year old mildly overweight white male with history of  CAD, status post bypass grafting in 1997, and PCI and stents since.  He  has COPD, obstructive sleep apnea, remote tobacco, type 2 diabetes and  renovascular disease with claudication.  I performed staged bilateral  renal and iliac interventions in the last several months which resulted  in marked improvement in his hip claudication.  He does complain of some  dizziness and some left upper extremity claudication.  He has a LIMA to  his LAD.  Upper extremity Doppler suggested hemodynamically significant  left subclavian artery stenosis.  He presents now for angiography and  potential intervention.   DESCRIPTION OF PROCEDURE:  The patient was brought to the second floor  [facility] in the postabsorptive state.  He was  premedicated with p.o. valium, IV Versed and fentanyl.  His right groin  was prepped and shaved in the usual sterile fashion.  One percent  Xylocaine was used for local anesthesia.  A 5-French sheath was inserted  into the right femoral artery using Seldinger technique.  5-French  tennis racket catheter and JB1 catheters were used for arch angiography,  selective innominate and left subclavian artery angiography.  Visipaque  dye was used for the entirety of the case.  Retrograde aortic pressure  was monitored during the case.   ANGIOGRAPHIC RESULTS:  1. Arch aortogram;      a.     Type 1 arch.  2. Right innominate artery; widely patent.  There did  appear to be a      60-70% ostial stenosis in the right subclavian artery with      poststenotic dilatation.  Delayed imaging of his left vertebral      artery did not reveal retrograde vertebral filling.  3. Left subclavian; no evidence of hemodynamically significant      stenosis.  There was some mild tortuosity, but no pullback gradient      was noted.   IMPRESSION:  Mr. Pruiett most likely a false positive Doppler study and a  widely patent left subclavian artery.  He did have an echo nuclear study  back in August.  Continued medical therapy will be recommended.   The sheath was removed and pressure was held on the groin to achieve  hemostasis.  The patient left the lab in stable condition.  He will  continue gentle hydration and he will be discharged home later today as  an outpatient.  He will see me back in the office in 1-2 weeks for  follow up.      Nanetta Batty, M.D.  Electronically Signed     JB/MEDQ  D:  04/22/2009  T:  04/22/2009  Job:  [id number]   cc:   [facility]  [name], M.D.

## 2010-10-07 NOTE — Discharge Summary (Signed)
NAMECOSTA, JHA              ACCOUNT NO.:  [id number]   MEDICAL RECORD NO.:  [id number]          PATIENT TYPE:  INP   LOCATION:  3735                         FACILITY:  [facility]   PHYSICIAN:  Hillery Aldo, M.D.   DATE OF BIRTH:  Jul 31, 1933   DATE OF ADMISSION:  12/29/2006  DATE OF DISCHARGE:  12/31/2006                               DISCHARGE SUMMARY   PRIMARY CARE PHYSICIAN:  Dr. [name].   PRIMARY CARDIOLOGIST:  Dr. Chanda Busing.   PULMONOLOGIST:  Dr. Jetty Duhamel.   DISCHARGE DIAGNOSES:  1. Presyncopal event, likely vasovagal reaction to severe pain.  2. Lumbar spinal stenosis with radiculopathy and chronic pain.  3. Coronary artery disease.  4. Hypertension.  5. Hyperlipidemia.  6. Diabetes.  7. Chronic obstructive pulmonary disease/chronic bronchitis.  8. Gastroesophageal reflux disease.  9. Acute renal failure in the setting of chronic kidney disease.  10.History of gout.  11.Dementia  12.Polypharmacy.   DISCHARGE MEDICATIONS:  1. Colchicine 0.6 mg daily.  2. Verapamil 240 mg daily.  3. Zetia 10 mg daily.  4. Lexapro 10 mg daily.  5. Glipizide 10 mg b.i.d.  6. Tricor 145 mg daily.  7. Folic acid 400 mg daily.  8. Multivitamin daily.  9. Aspirin 81 mg daily.  10.Fish oil/omega 3 tablets 1000 mg daily.  11.Niacin 500 mg 2 tablets daily.  12.Exelon patch 4.6 mg.  13.Benicar 40 mg daily.  14.Lipitor 40 mg daily.  15.Terazosin 2 mg daily.  16.Gabapentin 100 mg daily.  17.Omeprazole 40 mg daily.  18.Spiriva inhaler 18 mcg 1 inhalation daily.   CONSULTATIONS:  Dr. Orvan Falconer of cardiology.   BRIEF ADMISSION HISTORY OF PRESENT ILLNESS:  The patient is a 77-year-  old male with past medical history of lumbar spinal stenosis and  radiculopathy who presented to the hospital for evaluation after  experiencing an episode of severe pain typical of his usual  radiculopathy type pain followed by a feeling of faintness.  The patient  did not actually lose  consciousness but did have an episode where his  vision got black, and he became nauseated and weak.  Apparently, the  patient has had other episodes similar to this in the past which have  been felt to be due to pain triggered bradycardic events due to a  vasovagal reaction.  Nevertheless, the patient was admitted for further  evaluation and workup.   PROCEDURES AND DIAGNOSTIC STUDIES:  1. CT scan of the head on December 29, 2006 showed no evidence of acute      intracranial abnormality.  Atrophy was present.  2. Chest x-ray on December 29, 2006 showed cardiomegaly and changes of      COPD/emphysema without acute cardiopulmonary disease.  3. MRI/MRA of the brain on December 30, 2006 showed brain atrophy with      minimal small vessel changes in the deep white matter.  There was      no acute or reversible finding.  There were normal intracranial      vessels of the large and medium size on MR angiography.  4. Carotid Dopplers on December 30, 2006 showed  40-60% internal carotid      artery stenosis bilaterally by velocity; plaque morphology did not      support the increase.  These findings were preliminary.  5. A 2-D echocardiogram on December 30, 2006:  Completed but results are      pending at this time.   DISCHARGE LABORATORY VALUES:  White blood cell count was 4.8, hemoglobin  12.1, hematocrit 35.7, platelets 161.  Sodium was 137, potassium 4.7,  chloride 105, bicarb 26, BUN 31, creatinine 2.22, glucose 134.  Hemoglobin A1c of 6.9%.  TSH was 2.736.  Cardiac enzymes were negative  x3 sets.   HOSPITAL COURSE:  1. Presyncope:  The patient had a full diagnostic workup including      MRI/MRA, carotid Doppler ultrasonography as well as 2-D      echocardiography.  No specific cause of his presyncope was found.      He was not felt to be hypoglycemic as the patient states that he      did check his blood glucose shortly after this episode and was      found to be in mid 1/50s.  Likewise, cardiac  enzymes have been      negative.  Given the fact that he has actually had other episodes      similar to this, it was felt that the most likely explanation for      presyncope was a vasovagal reaction to severe pain.  He was seen in      consultation with Dr. Elsie Lincoln of cardiology who felt that this was      the most likely explanation and recommended continuing current      medications with no further diagnostic workup.  He does have a      follow-up appoint with Dr. Elsie Lincoln on January 17, 2007.  He was not      orthostatic and therefore is stable for discharge and will be      discharged home today.  2.  Coronary artery disease:  The patient      was maintained on antiplatelet therapy.  Cardiac enzymes were      cycled q.8h. x3 sets and were negative.  He did not have any acute      changes on his 12-lead EKG tracings.  2. Hypertension:  The patient's blood pressure was well-controlled on      his usual medications.  These are continued at discharge.  3. Hyperlipidemia:  The patient is on multiple therapies to address      this.  These were continued in the hospital.  4. Diabetes:  The patient had no hypoglycemic episodes.  He was      covered with sliding scale insulin while in the hospital but can      resume his glipizide at discharge.  5. Chronic obstructive pulmonary disease/chronic bronchitis:  The      patient did not have any acute exacerbations.  He is continued on      his Spiriva as at home.  6. Gastroesophageal reflux disease:  The patient was continued on      proton pump inhibitor therapy.  7. Chronic disease kidney disease with acute renal failure:  The      patient's Benicar and Lasix were initially held.  His creatinine      which was 3.2 on admission declined to its baseline value of 2.22      with holding of these medications.  He should follow up with his  primary care physician for follow-up.  8. Lumbar spinal stenosis with severe radiculopathy and chronic  pain:      The patient should continue on his Neurontin and his usual pain      control efforts.  He was advised that this presyncope may return      with episodes of severe pain and that he should immediately sit      down so that he does not pass out and hurt himself.  9. History of gout:  The patient's gout has remained quiescent on      treatment with colchicine.   DISPOSITION:  The patient is medically stable for discharge and will be  discharged home.  He should follow up with primary care physician in 1-2  weeks.  He has follow-up scheduled with Dr. Elsie Lincoln as noted above.      Hillery Aldo, M.D.  Electronically Signed     CR/MEDQ  D:  12/31/2006  T:  12/31/2006  Job:  [id number]

## 2010-10-07 NOTE — Discharge Summary (Signed)
[name], [name]              ACCOUNT NO.:  [id number]   MEDICAL RECORD NO.:  [id number]          PATIENT TYPE:  INP   LOCATION:  3743                         FACILITY:  [facility]   PHYSICIAN:  Sheliah Mends, MD      DATE OF BIRTH:  1934/02/20   DATE OF ADMISSION:  05/05/2008  DATE OF DISCHARGE:  05/20/2008                               DISCHARGE SUMMARY   DISCHARGE DIAGNOSES:  1. Unstable angina.  2. Non-st-segment elevation myocardial infarction, post procedure done      May 11, 2008.  He had two non-drug-eluting stents placed,      Multilink Vision 3.5 x 23 and 3.5 x 12 to his saphenous vein graft,      to his obtuse marginal with elevated enzymes postprocedure.  3. Atherosclerotic cardiovascular disease with history of coronary      artery bypass graft in 1997 with a left internal mammary artery      graft to his left anterior descending and saphenous vein graft to      his obtuse marginal.  In March 2006, he had a Cypher stent to his      right coronary artery x2.  Myoview in February 2008 was negative      for ischemia.  This admission he showed progression of disease.  On      May 11, 2008, he underwent cardiac catheterization revealing      high-grade disease in his saphenous vein graft to his obtuse      marginal 95%-99%, reduced to 0 with two Multilink Vision stents.  4. Acute renal failure secondary to contrast nephropathy,      postprocedure he received dialysis x2.  5. Acute anemia with gastrointestinal bleed.  He received 2 units of      packed red blood cells on May 13, 2008, and 1 unit on May 17, 2008.  6. History of chronic kidney disease with a creatinine baseline of      2.5.  7. Congestive heart failure during this admission secondary to renal      failure.  8. History of normal ejection fraction by echocardiogram on December 30, 2006, with an EF of 55%-60%.  9. Chronic obstructive pulmonary disease.  10.Hypertension.  11.Non-insulin-dependent diabetes mellitus.  12.History of paroxysmal atrial fibrillation.  In the past, he has      been intolerant to Rythmol with worsened dementia and also beta-      blockers.  13.History of bradycardia.  14.Arteriosclerotic peripheral vascular disease.  15.Positive head screen this admission.  He was changed to Refludan.  16.Obesity.  17.Probable obstructive sleep apnea.  18.History of spinal stenosis and neuropathy.  19.Acute gastrointestinal bleed, thought to be used from a      diverticular bleed.  20.Dementia.   CONSULTS:  1. On May 13, 2008, renal consult for his renal failure with a      high of his BUN, creatinine of 80/7.1.  2. GI consult, May 16, 2008, Dr. Marina Goodell for a GI bleed.   PROCEDURES:  1. May 08, 2008, perfusion  scan for elevated D-dimer and negative      for pulmonary embolism.  2. May 11, 2008, cardiac catheterization resulting in progressive      disease and two stents, non-drug-eluting stents placed by Dr.      Elsie Lincoln.  3. May 14, 2008, dialysis catheter placed in his right femoral      for dialysis.  4. On May 13, 2008, he received 2 units of packed red blood      cells.  5. On May 17, 2008, he received 1 unit of packed red blood cells.  6. May 16, 2008, left arm Dopplers for possible arteriovenous      graft.  7. May 17, 2008, colonoscopy.  He had blood throughout his colon.      No obvious source.  He had a right-sided colonoscopy  identified      diverticulum.  8. Also EGD was done.  He had a hiatal hernia and no upper      gastrointestinal bleed.  9. May 19, 2008, he had a capsule endoscopy.   LABORATORY DATA:  May 20, 2008, hemoglobin 10.5, hematocrit 30.9,  WBCs 11.9, and platelets 324.  May 19, 2008, hemoglobin 9.6,  hematocrit 28.9, uric acid 8.2.  Sodium 142, potassium 3.9, chloride  106, CO2 24, glucose 152, BUN 29, creatinine 2.38.  Hemoglobin A1c on  May 15, 2008, was 6.7.  Hepatitis B was negative.  TSH was 1.485.  Occult blood on May 13, 2008, was negative, hemoglobin low of 8.2  and 24.9 on May 13, 2008.  Postprocedure CK-MBs May 12, 2008,  CK-MB 446/10.5, troponin of 0.69.  #2, 491/17.  BNP, May 10, 2008,  was 1588, heparin antibody screen was positive, May 07, 2008.  Chest x-ray, last one May 14, 2008, showed cardiomegaly with stable  mild interstitial edema and trace right effusion and then perfusion scan  showed no pulmonary embolus.   DISCHARGE MEDICATIONS:  1. Verapamil 240 mg every day.  2. Zetia 10 mg every day.  3. Lexapro 10 mg every day.  4. Folic acid 400 mg every day.  5. Multivitamins every day.  6. Aspirin currently is on hold because of his GI bleed.  7. Niacin 1000 mg every day.  8. Actos 15 mg every day.  9. Lipitor 40 mg every p.m.  10.Neurontin 100 mg every p.m.  11.Spiriva inhaler 18 mcg every p.m.   MEDICATIONS ADDED:  1. Start Bystolic 2.5 mg every day.  2. Start Plavix 75 mg every day.  3. Start Protonix 40 mg twice per day.  4. Start allopurinol 100 mg daily.  5. Start Flomax 0.4 mg daily.  6. Start Hydralazine 25 mg three times per day.   Currently on hold is his glipizide 15 mg b.i.d. secondary to multiple  episodes of hypoglycemia.  This may need to be added as an outpatient,  started at much lower dose.  Aspirin is being held because of his GI  bleed.  Tricor is being held because of his renal insufficiency and  renal failure.  Colchicine being held because of his renal failure.  Fish oil being held because of his GI bleed.  Benicar being held because  of his renal failure.  Terazosin being held.  Lasix being held.  Omeprazole being held because of its interaction with Plavix.  Added  Protonix instead.   FOLLOWUP:  He will need to follow up with Dr. Marina Goodell soon.  He will also  need to follow up with  Dr. Elsie Lincoln soon and he will need to follow up  with his primary  care doctor soon for following his diabetes for  restarting his glipizide.   ACTIVITY LEVEL:  He can increase his activity as tolerated.  The family  did not want physical therapy at home, they think they can do the  physical therapy on their own.   HOSPITAL COURSE:  [name] was admitted because of chest pain.  He has  known coronary artery disease.  He came in.  His CK MBs were negative  but his troponins were mildly elevated.  It was felt that he needed to  have a cardiac catheterization for recurrent angina symptoms  progressive; however, because of his elevated creatinine, cath had been  discussed as an outpatient and also as inpatient and the risks  associated with this such as renal failure.  Cardiac catheterization was  performed on May 11, 2008.  He was found to have progressive  disease, high grade in his SVG graft to his OM.  He had non-drug-eluting  Multilink Vision stent placed x2 by Dr. Elsie Lincoln.  Unfortunately, he then  had post contrast nephropathy.  Renal consult was called.  Multiple  medications were held.  He was having multiple episodes of hypoglycemia,  these were also held.  A CPAP was placed.  He was transferred to  intensive care because of his problems with fluid overload.  He also  became lethargic.  He did require dialysis x2.  His hemoglobin dropped  and he received 2 units of packed red blood cells on May 13, 2008.  He had melena stools starting about May 15, 2008, or May 16, 2008.  GI consult was called.  He did have colonoscopies that showed  blood throughout his colon.  No definite source was identified.  It was  felt that this was a diverticular bleed.  His creatinine started to  improve and the renal physicians signed off on May 19, 2008.  GI  continued to see him.  He also had an EGD that was negative for any  source of bleed.  He also had a capsule endoscopy on May 19, 2008,  that was complete, but results were still  pending at the time of this  discharge.  The on call group for [facility] felt that he could be  discharged home and follow up with his GI doctor as an outpatient.  On  May 20, 2008, he was seen by Dr. Garen Lah.  His hemoglobin and  hematocrit were stable.  His blood pressure was 132/46, pulse was 63,  respirations 18, and temperature was 98.3.  He was considered stable for  discharge home.  His family refused any help with home health nurse or  Physical Therapy.      Lezlie Octave, N.P.      Sheliah Mends, MD  Electronically Signed    BB/MEDQ  D:  05/20/2008  T:  05/21/2008  Job:  [id number]   cc:   [name], M.D.  Wilhemina Bonito. Marina Goodell, MD  Renal doctors

## 2010-10-07 NOTE — H&P (Signed)
[name], [name]              ACCOUNT NO.:  [id number]   MEDICAL RECORD NO.:  [id number]          PATIENT TYPE:  EMS   LOCATION:  MAJO                         FACILITY:  [facility]   PHYSICIAN:  Tresa Endo, MD              DATE OF BIRTH:  [birth year]   DATE OF ADMISSION:  05/05/2008  DATE OF DISCHARGE:                              HISTORY & PHYSICAL   CHIEF COMPLAINTS:  Chest pain.   HISTORY OF PRESENT ILLNESS:  [name] is a pleasant 77 year old male  who has been followed by Dr. [name].  Dr. Elsie Lincoln as his  cardiologist.  He had bypass surgery in 1997 with an LIMA to the LAD and  an SVG to the OM.  In March 2006, he underwent Cypher stenting to the  native RCA with two stents, at that time the LIMA to the LAD and the  vein graft to the OM were patent.  He has had a Myoview in February 2008  that was negative for ischemia and showed good LV function.  He has had  some intermittent problems with COPD and pneumonia but otherwise has  done well from a cardiac standpoint.  He does have some vascular  disease, family states he recently had carotid Dopplers that indicated  some carotid disease, as well as left lower extremity disease.  Tonight  at home he developed substernal chest pain and took two nitroglycerin  with some relief.  His pain recurred and came to the ER by private  vehicle.  He had pain that went into his arms and into his back.  He  denies any shortness of breath, nausea, vomiting or diaphoresis.  He is  currently still having some pain in the emergency room on IV nitro and  heparin.  He is admitted now for unstable angina and further evaluation.   PAST MEDICAL HISTORY:  Remarkable for treated hypertension.  He has not  had diabetes.  He has neuropathy with spinal stenosis.  He has had COPD,  he has been seen by Jetty Duhamel in the past.  He has chronic renal  insufficiency with a creatinine in the 2.5 range.  He has a history of  gout.  He has had a past history of  PAF.  He apparently was intolerant  to Rythmol which caused worsening of his dementia.  He is also  intolerant to beta blockers, I suspect this is because of baseline  bradycardia.   He is on multiple medications as follows:  Aspirin 81 mg a day, Benicar  40 mg a day, colchicine 0.6 mg a day, fish oil 1 gram b.i.d., folic acid  daily, Lasix 40 mg a day, glipizide 15 mg b.i.d. Lexapro 10 mg a day,  Lipitor 40 mg a day, multivitamin daily, niacin 1 gram b.i.d., Prilosec  40 mg a day, Spiriva 18 mcg a day, terazosin 2 mg at h.s., Tricor 145  daily, verapamil 2/40 mg a day, Zetia 10 mg a day, and Neurontin 100 mg  a day.   ALLERGIES:  Sulfa.   SOCIAL HISTORY:  He  is married.  He quit smoking in 1992.  He lives at  home.  He does have an unsteady gait.  He was on Coumadin at one point  but I believe this was discontinued possibly because he was holding  sinus rhythm and he has an unsteady gait.   FAMILY HISTORY:  Remarkable in that his father died 21 of coronary  disease complications and his mother had diabetes and coronary disease  and died in her 38s.   REVIEW OF SYSTEMS:  He has a past history of Alzheimer stent dementia  wall although he seems to be oriented, in the emergency room tonight.  He had no history of GI bleeding or melena.  He denies any recent fever  or chills.  Review of systems otherwise unremarkable except for noted  above.   PHYSICAL EXAMINATION:  Blood pressure 144, 52, pulse 54, temperature  97.7.  GENERAL:  He is a well-developed, well-nourished male in no acute  distress.  HEENT:  Normocephalic and wears glasses.  NECK:  Without JVD.  He does have a right carotid artery bruits.  CHEST:  Reveals essentially clear lung fields although his breath sounds  are somewhat diminished bilaterally.  CARDIAC:  Reveals regular rate and rhythm without obvious murmur or  gallop.  Normal S1 and S2.  ABDOMEN:  Obese, nontender.  EXTREMITIES:  Reveal 1 to 2+ edema on the  right lower extremity, he did  have a venectomy on that side, both lower extremities reveal diminished  distal pulses.  NEURO:  Exam is grossly intact.  He is awake, alert and oriented and  cooperative.  SKIN:  Warm and dry.   LABORATORY DATA:  Sodium 142, potassium 4.3, BUN 42, creatinine 2.4.  White count 6.4, hemoglobin 12.6, hematocrit 37.7, platelets 153, INR  1.1, troponin 0.05.  Chest x-ray shows cardiomegaly with COPD.   IMPRESSION:  1. Unstable angina.  2. Known coronary disease with coronary artery bypass grafting in      1997, RCA Cypher stenting in March 2006 with a negative Myoview in      February 2008, and good LV function.  3. Chronic renal insufficiency with a creatinine in the 2.5 range.  4. Non-insulin dependent diabetes.  5. Treated hypertension.  6. Peripheral vascular disease with carotid bruit and apparently left      lower extremity disease per the patient's family.  7. Chronic leg pain from spinal stenosis.  8. History of Alzheimer's type dementia though the patient seems      oriented, in the emergency room tonight.  9. History of chronic obstructive pulmonary disease (COPD).  10.Treated dyslipidemia   PLAN:  The patient was seen by Dr. Tresa Endo and myself in the emergency  room.  I will go ahead and hold his ARB for now, as well as his Lasix.  Will gently hydrate him.  Currently he is on IV heparin and  nitroglycerin.  Will continue this and cycle his enzymes.      Abelino Derrick, P.A.    ______________________________  Tresa Endo, MD    LKK/MEDQ  D:  05/05/2008  T:  05/05/2008  Job:  [id number]

## 2010-10-07 NOTE — Op Note (Signed)
NAMEJEFFRY, VOGELSANG              ACCOUNT NO.:  [id number]   MEDICAL RECORD NO.:  [id number]          PATIENT TYPE:  INP   LOCATION:  2010                         FACILITY:  [facility]   PHYSICIAN:  Currie Paris, M.D.DATE OF BIRTH:  05/23/1934   DATE OF PROCEDURE:  08/10/2008  DATE OF DISCHARGE:                               OPERATIVE REPORT   PREOPERATIVE DIAGNOSIS:  Chronic calculus cholecystitis.   POSTOPERATIVE DIAGNOSIS:  Chronic calculus cholecystitis.   OPERATION:  Laparoscopic cholecystectomy with operative cholangiogram.   SURGEON:  Currie Paris, MD   ASSISTANT:  Kelle Darting. Rennis Harding, NP   CLINICAL HISTORY:  Mr. Cordts is a 77 year old gentleman who was  admitted with right upper quadrant pain for a day or two, prior to  admission with some nausea and vomiting.  He was known to have a  significant cardiac history having had some stents in place.  He also  has some chronic renal failure, congestive heart failure, depression,  diabetes, gout, and hypertension.  Following this admission and  evaluation, his abdominal pain is getting clear, but he had a severe  episode of gout in his right knee.  In addition, he had significant  congestive failure.  The congestive failure was managed and controlled  by his cardiologist, Dr. Elsie Lincoln and Orthopedics came and helped with his  knee having gotten much improved.  Although, he did not appear to have  acute cholecystitis, he has been having biliary symptoms.  We felt that  his stability was such now that we should proceed to a cholecystectomy,  because we were concerned that should he develop acute cholecystitis in  a less stable position, his surgery would be much more risky.  The  patient  and his family agreed.   DESCRIPTION OF PROCEDURE:  The patient was seen in the holding area and  had no further questions.  We confirmed cholecystectomy as the planned  procedure.   The patient was taken to the operating room and after  satisfactory  general endotracheal anesthesia had been obtained, the abdomen was  prepped and draped.  The time-out was done.   I used 0.25% plain Marcaine for each incision.  The umbilical incision  was made, the fascia opened, and the peritoneal cavity entered under  direct vision.  The Hasson was placed, the abdomen insufflated to 15,  and the patient placed in reverse Trendelenburg tilted to the left.   A 10/11 trocar was placed in the epigastrium two 5 laterally.  There  were multiple omental adhesions over the entire length of the  gallbladder, which were taken down with a combination of cautery and  blunt dissection along with a clip on anything that looked like small  vessel.  The gallbladder was flaccid, very thin walled, and somewhat  edematous.   When I was able to finally get down by the ampulla, the anatomy was not  clear, so I opened up the peritoneum on either side to try to create a  window, and with a combination of blunt dissection and hydrodissection,  was able to get around what appeared initially to be  the cystic duct and  cystic artery branching off from it.  I went out fairly high so that I  was well up on the gallbladder and put a clip here.   At this point, the catheter was introduced and operative angiography  done.  We had good filling of the common duct as well as the pancreatic  duct and up into the hepatic radicals.  I appeared to have a very long  segment of cystic duct.   I removed that catheter put 3 clips across the cystic duct, which  appeared be stuck to the posterior wall of the cystic artery and divided  that.  I then removed the gallbladder from below to above primarily with  coagulation current of the cautery.  The gallbladder had spilled a  little bile, which was suctioned out and this was coming up, I could not  get a good plane along the back wall so I initially left the back wall  of the gallbladder and got the gallbladder completely  removed and placed  into the bag.   With that done, I was then able to use a grasper to tract on the back  wall and using cautery get that excised and bring it out separately.   I spent several minutes irrigating and making sure everything was dry.  I put some Surgicel along the bed of the gallbladder.   The gallbladder was brought out the umbilical port.  The port was  occluded while we reinsufflated and checked for hemostasis and again  everything seemed to be dry.   The lateral ports removed under direct vision and there was no bleeding.  The umbilical site was closed with a pursestring.  The abdomen was  deflated through the epigastric site port.  Skin was closed with 4-0  Monocryl subcuticular plus Dermabond.   The patient tolerated the procedure well.  There were no operative  complications.  All counts were correct.      Currie Paris, M.D.  Electronically Signed     CJS/MEDQ  D:  08/10/2008  T:  08/11/2008  Job:  [id number]

## 2010-10-07 NOTE — Discharge Summary (Signed)
NAMEELIZABETH, HAFF              ACCOUNT NO.:  [id number]   MEDICAL RECORD NO.:  [id number]          PATIENT TYPE:  INP   LOCATION:  2010                         FACILITY:  [facility]   PHYSICIAN:  Currie Paris, M.D.DATE OF BIRTH:  1934-01-04   DATE OF ADMISSION:  08/04/2008  DATE OF DISCHARGE:  08/12/2008                               DISCHARGE SUMMARY   FINAL DIAGNOSES:  1. Chronic calculous cholecystitis.  2. Acute on chronic heart failure.  3. Chronic obstructive pulmonary disease.  4. Chronic kidney disease.  5. Type 2 diabetes.  6. Gout.   CLINICAL HISTORY:  [name] is a 77 year old gentleman admitted on  March 13 with a day or so of abdominal pain, right upper quadrant  associated with nausea and vomiting.  He was known to have a recent MI  and significant cardiac disease.  He is known to have gallstones and  admitted for further evaluation, possible cholecystostomy or  cholecystectomy.   HOSPITAL COURSE:  The patient was admitted and seen in consultation by  his cardiologist, Dr. Elsie Lincoln.  He underwent a HIDA scan.  He continued  to have right upper quadrant pain with no right upper quadrant  tenderness.  He had some slight elevation in his creatinine at 2.4 that  was noted.  That was thought to be his baseline, however.  In addition,  he had some right knee swelling and orthopedic consultation was  obtained.  This was thought likely to be some gouty arthritis.   After stabilization and being sure that his cardiac status was okay, we  planned to take him to the operating room.  On March 16, he was noted to  have a little fever.  His white count going to 12,000.  His HIDA scan  however was normal and we thought that he was having biliary colic.  However we want to rule out other causes so, CT was obtained.  No other  acute abdominal process was noted.  Therefore on March 17, he was taken  to the operating room for a laparoscopic cholecystectomy with  cholangiogram.  He tolerated that well.  Postoperatively, he was  improved.  His abdominal pain that he had preoperatively was completely  resolved.  He had no postoperative cardiac issues.  His renal status  stayed stable.  He was able to be discharged on March 21 to be followed  by both, me and his cardiologist.      Currie Paris, M.D.  Electronically Signed     CJS/MEDQ  D:  01/09/2009  T:  01/09/2009  Job:  [id number]

## 2010-10-07 NOTE — Consult Note (Signed)
[name], [name]              ACCOUNT NO.:  [id number]   MEDICAL RECORD NO.:  [id number]          PATIENT TYPE:  INP   LOCATION:  2913                         FACILITY:  [facility]   PHYSICIAN:  Maree Krabbe, M.D.DATE OF BIRTH:  Jul 06, 1933   DATE OF CONSULTATION:  05/13/2008  DATE OF DISCHARGE:                                 CONSULTATION   REASON FOR CONSULT:  Elevated creatinine.   HISTORY:  This is a pleasant 77 year old male with obesity, type 2  diabetes, hypertension, neuropathy with spinal stenosis, and COPD.  He  has chronic renal failure with creatinine of 2.5 at baseline followed by  Dr. Caryn Section of [facility].  Also, he has a history of gout,  paroxysmal atrial fibrillation, and bradycardia due to beta-blockers in  the past.   The patient was admitted on May 05, 2008, with chest pain and had  positive enzymes.  He had a previous bypass in 1997, and subsequent  stent in 2006.  He had a negative Myoview in February 2008.  Troponins  were rising as well as CK-MB, so the patient was sent for heart  catheterization on December 18.  He had a high-grade stenosis of the  saphenous vein graft to the obtuse marginal of the left circumflex  artery.  The stenosis was reduced to 0%.  The following day, his  creatinine rose from 2.5 up to 3.6, and today it is up to 5.4 with BUN  of 79, potassium of 4.5, and bicarb of 20.  Hemoglobin 8.2 and white  blood count of 7000.  Platelets are normal at 198.  Urine output was  down yesterday to 125 mL.   The patient denies any specific complaints.  He has been lethargic for  the last 24 hours.  He is getting one dose of Narcan right now.   PAST MEDICAL HISTORY:  As above.   CURRENT MEDICATIONS:  1. Aspirin.  2. Plavix.  3. Colchicine.  4. Valium.  5. Colace.  6. Lexapro.  7. Zetia.  8. Folic acid.  9. Lasix is on hold.  10.Neurontin.  11.NovoLog.  12.Imdur.  13.Lactulose daily.  14.Xopenex q.i.d.  15.Narcan  p.r.n.  16.Bystolic  2.5 p.o. daily.  17.Benicar is on hold.  18.Protonix.  19.Actos.  20.Crestor is on hold.  21.Hytrin 2 mg at night.  22.Spiriva.  23.Verapamil 240 mg daily and several p.r.n. medications.   ALLERGIES:  SULFA.   SOCIAL HISTORY:  Married, quit smoking in 1992, lives at home.   FAMILY HISTORY:  Father died at 64 of heart disease and mother had  diabetes and heart disease, died in her 52s.   REVIEW OF SYSTEMS:  Past history of mild Alzheimer's type dementia, but  the patient is oriented currently.  No fever, chills, sweats, weight  loss, difficulty swallowing, chest pain, shortness of breath.  Denies  abdominal pain, nausea, vomiting, diarrhea, or difficulty voiding.  He  has a Foley catheter in place.  Denies any joint complaints.  He has  chronic swelling at the ankles.  No focal neurologic symptoms.   PHYSICAL EXAMINATION:  VITAL SIGNS:  Blood pressure is stable at 150/60,  pulse 70, respirations 20, temp 98.4, and O2 sat 92% on 3 L.  GENERAL:  The patient is somnolent, but arousable.  He will wake in and  say his name, and he knows the name of the president.  Moves all 4  extremities equally.  SKIN:  Warm and dry without rash.  HEENT:  PERRLA, EOMI.  THROAT:  Clear.  NECK:  Supple without JVD.  CHEST:  Essentially clear throughout.  CARDIAC:  Regular rate and rhythm with 2/6 systolic ejection murmur.  No  murmur, no rub or gallop.  ABDOMEN:  Significantly obese, nontender, and active bowel sounds.  EXTREMITIES:  He has 2+ pitting edema in the lower extremities and 1 to  2+ in the upper extremities.  NEUROLOGIC:  Lethargic as noted above, but will move all 4 extremities  and open his eyes to loud voice.   LABORATORIES:  BUN 79, creatinine 5.4, sodium 130, and potassium 4.5.  Hemoglobin 8.2, hematocrit 25%, white blood count 7000, and platelets  normal.  The last chest x-ray was done on May 09, 2008, it was  normal.  V/Q scan unremarkable.    IMPRESSION:  1. Oliguric acute renal failure due to contrast nephropathy.  2. Chronic kidney disease with baseline creatinine of 2.5.  3. Volume overload without pulmonary edema.  4. Altered mental status due to narcotics, improved with Narcan.  5. Coronary artery disease with previous CABG, status post stent to      his saphenous vein graft earlier this hospitalization.  6. Anemia, acute-on-chronic and partly due to chronic renal disease.  7. Obesity.  8. Gout.  9. Non-ST elevation myocardial infarction.   RECOMMENDATIONS:  Continue to hold diuretics and ARB.  The patient is  not volume depleted, so I do not think fluids are indicated.  He has  some extra volume, but I do not think he needs Lasix either.  I would  just watch him for the time being without Lasix or fluids, have him eat  and drink his usual amount with a modest fluid  restriction and potassium restriction, and watch him over the next 24-48  hours.  Avoid all narcotics, so we can monitor his mental status  closely.  He will probably need dialysis in the next 48 hours if he does  not improve.  Will follow.      Maree Krabbe, M.D.  Electronically Signed     RDS/MEDQ  D:  05/13/2008  T:  05/14/2008  Job:  [id number]

## 2010-10-07 NOTE — Op Note (Signed)
NAMELONNY, EISEN              ACCOUNT NO.:  [id number]   MEDICAL RECORD NO.:  [id number]          PATIENT TYPE:  INP   LOCATION:  3743                         FACILITY:  [facility]   PHYSICIAN:  Anselmo Rod, M.D.  DATE OF BIRTH:  Aug 03, 1933   DATE OF PROCEDURE:  05/18/2008  DATE OF DISCHARGE:  05/20/2008                               OPERATIVE REPORT   /PROCEDURE PERFORMED/>  Colonoscopy with cold biopsies x4.   ENDOSCOPIST:  Anselmo Rod.   INSTRUMENT USED:  Pentax video colonoscope.   INDICATIONS FOR PROCEDURE:  A 77 year old white male with a history of  rectal bleeding undergoing a colonoscopy.  The patient had a prior  colonoscopy done by Dr. Christella Hartigan and a large amount of blood was noted in  the colon.  Therefore, visualization was not adequate.  The patient was  reprepped for this colonoscopy and the procedure is being undertaken to  rule out AVMs, masses, polyps, etc.   PREPROCEDURE PREPARATION:  Informed consent was procured from the  patient.  The patient had fasted for 4 hours prior to the procedure and  was prepped with MoviPrep the night of and in the morning I went over  the procedure and the risks and benefits of the procedure including a  10% miss rate of cancer and polyps were discussed with the patient as  well.   PRE-PROCEDURE PHYSICAL:  The patient had stable vital signs.  NECK:  Supple.  CHEST:  Clear to auscultation.  HEART:  S1 and S2 regular.  ABDOMEN:  Soft with normal bowel sounds.   DESCRIPTION OF PROCEDURE:  The patient was placed in the left lateral  decubitus position and sedated with 70 mcg of Fentanyl and 6 mg of  Versed given intravenously in slow incremental doses.  Once the patient  was adequately sedated and maintained on low-flow oxygen and continuous  cardiac monitoring, the Pentax video colonoscope was advanced from the  rectum to the cecum.  A few scattered diverticula were noted.  Two small  sessile polyps were biopsied from the  cecum and proximal right colon  respectively and one was biopsied from the left colon.  No definite  source of bleeding could be identified.  The appendiceal orifice and  ileocecal valve were clearly visualized and photographed.  The terminal  ileum appeared healthy without lesions.  Retroflexion in the rectum  revealed small internal hemorrhoids.  The patient tolerated the  procedure well without immediate complications.   IMPRESSION:  1. A few scattered diverticula.  2. Two small sessile polyps removed by cold biopsies from the cecum      and proximal right colon and one from the left colon.  3. Small internal hemorrhoids seen on retroflexion.   RECOMMENDATIONS:  1. Continue serial CBCs.  2. In view of the fact the patient had unrevealing      esophagogastroduodenoscopy and colonoscopy with no source of      bleeding identified, a capsule study has  been ordered.  This will      be read by Dr. Rob Bunting or his partners if the patient does  not have any more ongoing bleeding through the Christmas weekend.      Anselmo Rod, M.D.  Electronically Signed     JNM/MEDQ  D:  05/21/2008  T:  05/21/2008  Job:  [id number]   cc:   Madaline Savage, M.D.  Rachael Fee, MD

## 2010-10-07 NOTE — Cardiovascular Report (Signed)
[name], [name]              ACCOUNT NO.:  [id number]   MEDICAL RECORD NO.:  [id number]          PATIENT TYPE:  INP   LOCATION:  2502                         FACILITY:  [facility]   PHYSICIAN:  Nanetta Batty, M.D.   DATE OF BIRTH:  28-Nov-1933   DATE OF PROCEDURE:  DATE OF DISCHARGE:                            CARDIAC CATHETERIZATION   [name] is a 76 year old mildly overweight married white male with  history of CAD, status post CABG in 1997.  His other problems include  labile hypertension, obstructive sleep apnea, COPD with dyspnea on  exertion, remote tobacco abuse, type 2 diabetes, and severe hip  claudication.  He has had radiocontrast nephropathy in the past  associated with catheterization in December 2009, requiring transient  dialysis (over 300 mL of dye at that time).  He has high frequency  signals in both iliacs.  His ARB and diuretics were held and his  creatinine came down from 1.8-1.4.  He received hydration and Mucomyst.  He presents now for angiography and potential intervention of his renal  arteries and/or iliac arteries.   DESCRIPTION OF PROCEDURE:  The patient was brought to the Second Floor  [facility] in the postabsorptive state.  He was  premedicated with p.o. Valium, IV Versed and fentanyl.  His right groin  was prepped and shaved in the usual sterile fashion.  Xylocaine 1% was  used for local anesthesia.  A 5-French sheath was inserted into the  right femoral artery using standard Seldinger technique.  A 5-French  pigtail catheter was used for abdominal aortography, a 5-French short  right Judkins was used for selective right and left renal artery  angiography, and a 5-French end-hole was used for pullback across the  right common iliac artery after administration of 200 mcg of  intraarterial nitroglycerin via the side-arm sheath.  A total of  approximately 110 mL of contrast was used throughout the case and 10 mL  of  Visipaque.   ANGIOGRAPHIC RESULTS:  1. Abdominal aorta      a.     Renal arteries - 8% right renal artery stenosis, 70% left       renal artery stenosis with a 40-mm pullback gradient.  2. Left lower extremity; 70% proximal left common iliac artery      stenosis.  3. Right lower extremity; 80% complex proximal right common iliac      artery stenosis with an 80-mm pullback gradient after      administration of 200 mcg of intraarterial nitroglycerin.   DESCRIPTION OF PROCEDURE:  The patient received Angiomax bolus with an  ACT of greater than 200.  The sheath was upgraded to a 6-French sheath  and using a 6-French short right Judkins guide catheter along with 0.014  x 190 stabilizer wire and 4 x 2 Biotrack balloon angioplasty was  performed on the proximal right renal artery.  The stenting was  performed with a 6 x 15 Herculink stent at 10 atmospheres resulting in  reduction of 80% proximal right renal artery stenosis to 0% residual.  Following this, the sheath was then upgraded to  a 7-French 30-cm long  bright tip and a 12 x 4 Smart nitinol self-expanding stent was then  advanced across the [facility] wire and deployed at the ostium of the right  common iliac artery.  This was postdilated with an 8 x 3 balloon at 10  atmospheres resulting in reduction of an 80% proximal right common iliac  artery stenosis to 0% residual.  The patient tolerated the procedure  well.  The guidewire was removed.  The patient left the Lab in stable  condition.   Plans will be to remove the sheath in 2-3 hours.  He will also need  aggressive management on his blood pressure during sheath removal.  He  will be hydrated overnight and his ARB and Lasix will be held.  His  renal function will  be assessed.  He will receive aspirin and Plavix and will be discharged  home once his renal function is demonstrated to be stable.   Plans will be to stage his left renal and left iliac artery.      Nanetta Batty,  M.D.  Electronically Signed     JB/MEDQ  D:  01/22/2009  T:  01/23/2009  Job:  [id number]   cc:   Second [facility]  [facility]  [name], M.D.

## 2010-10-09 ENCOUNTER — Encounter: Payer: Self-pay | Admitting: Internal Medicine

## 2010-10-09 NOTE — Assessment & Plan Note (Signed)
Continues to need oxygen 3 l/m for sleep with BiPAP

## 2010-10-10 NOTE — Cardiovascular Report (Signed)
NAMEJAEDEN, [name]              ACCOUNT NO.:  [id number]   MEDICAL RECORD NO.:  [id number]          PATIENT TYPE:  INP   LOCATION:  3738                         FACILITY:  [facility]   PHYSICIAN:  Madaline Savage, M.D.DATE OF BIRTH:  [birth year]   DATE OF PROCEDURE:  06/04/2004  DATE OF DISCHARGE:                              CARDIAC CATHETERIZATION   PROCEDURE:  Cardiac catheterization and percutaneous coronary intervention.   PROCEDURES PERFORMED:  1.  Selective coronary angiography by Judkins technique.  2.  Retrograde left heart catheterization.  3.  No left ventricular angiography was performed to conserve on dye.  4.  Selective visualization of the left internal mammary artery graft.  5.  Selective visualization of a single saphenous vein graft to obtuse      marginal branch #1  6.  Percutaneous coronary stenting of the distal RCA.   COMPLICATIONS:  None.   ENTRY SITE:  Right femoral.   DYE USED:  Visipaque, dye load 270 cc.   MEDICATIONS:  1.  Integrilin, double bolus technique.  2.  Plavix 300 milligrams p.o.  3.  Fentanyl 50 milligrams twice during the case for sedation.   PATIENT PROFILE:  The patient is an obese, diabetic gentleman who recently  presented to the hospital with unstable coronary syndrome with negative  cardiac enzymes for myocardial infarction. He has stabilized on IV  nitroglycerin and medical therapy. He had his Coumadin discontinued, and was  started on heparin earlier today when his INR was less than 2.0. His INR at  the current time of cath was 1.7, but it was felt that we needed to go and  proceed with procedure. No bleeding complications occurred from the right  groin   RESULTS:  PRESSURES:  The left ventricular pressure was 160/0, end-diastolic  pressure 0, femoral artery pressure 160/55, mean of 95 no aortic valve  gradient by pullback technique.   ANGIOGRAPHIC RESULTS:  The left main coronary artery showed calcification  and proximally  40% to 60% stenosis.   The LAD contained a 95% stenosis in the midportion of the vessel just beyond  the septal perforator branch. There was competitive flow into the distal LAD  from a patent left internal mammary artery graft which was later visualized  in an antegrade fashion. A diagonal branch arising before the first septal  perforator branch was patent.   The circumflex coronary artery was patent; however, an intermediate ramus  branch arising off the left main was 100% occluded proximally, and there was  good filling of this vessel distally via patent saphenous vein graft   RCA was never bypassed.  It showed new de novo lesions in the distal RCA,  two of which were 75% stenosed.  They together comprise about a third 30-35  mm segment of stenosis and were subsequently stented   No left ventricular angiogram was performed.   PERCUTANEOUS INTERVENTION:  The patient was given double bolus Integrilin  and heparin, and his ACT was 347.  This case was technically difficult in  many ways.  The guide catheter backup was difficult in the best scenario.  After  I used a multiple guide catheters proving to not seat well into the  ostium, and to not provide good backup, I finally decided on a Cordis guide  catheter that was a 3-D RC catheter. The guide wire used was a ATW marker  wire, and then we direct stented using two Cypher stents. The first one  placed most distally was a 2.5 x 23 mm stent, and the second was a 32.5 x 13  stent overlapping the first.  No post dilatation was done, but the stent  balloon was deployed to 16 atmospheres of pressure including the overlap  site.  The lesions of 75% in the distal RCA were each reduced with stenting  and 75% stenoses in both locations distally were reduced to 0% residual, and  TIMI III distal flow was preserved post stenting.  No complications occurred  although the patient, although this was a technically difficult procedure.   My goal at  the beginning of the case was to use 70 cc of dye total.  It  ended up being 270 cc because the case was so technically  difficult, and  due to the patient's obesity, it was hard to visualize the vessels due to  poor transmission of fluoroscopy image through the patient's thick chest.   FINAL DIAGNOSES:  1.  Status post coronary artery bypass graft with patent IMA to LAD and      patent saphenous vein graft to obtuse marginal branch #1.  2.  De novo lesions of 75%, and distal RCA reduced to 0% residual using two      overlapping Cypher stents.  3.  No LV angiogram performed.       WHG/MEDQ  D:  06/04/2004  T:  06/04/2004  Job:  [id number]   cc:   Cath Lab

## 2010-10-10 NOTE — Consult Note (Signed)
NAMEREECE, FEHNEL                        ACCOUNT NO.:  [id number]   MEDICAL RECORD NO.:  [id number]                   PATIENT TYPE:  OUT   LOCATION:  MRI                                  FACILITY:  [facility]   PHYSICIAN:  Zachary George, DO                      DATE OF BIRTH:  1933-10-02   DATE OF CONSULTATION:  08/21/2002  DATE OF DISCHARGE:  08/08/2002                                   CONSULTATION   REASON FOR CONSULTATION:  The patient returns to the clinic today for re-  evaluation.  He was initially seen on 08/02/02.  In the interim, he had an  MRI of his lumbar spine which reveals multifactorial spinal stenosis and  bilateral subarticular lateral recess stenosis and neuroforaminal narrowing,  most notable at L4-5 and L5-S1 levels.  He continues to have pain in his  lower back radiating into his lower extremities with walking, consistent  with neurogenic claudication.  His pain level is a 4/10 on a subjective  scale.  He has been taking hydrocodone 5 mg/500 mg with modest improvement.  His function and quality of life indices have declined secondary to his  inability to walk longer distances.  His sleep is fair to poor.  I reviewed  health and history form and 14 point review of systems.  No new neurologic  complaints.   PHYSICAL EXAMINATION:  VITAL SIGNS:  Blood pressure is 163/70, pulse 62,  respiratory 18, O2 saturation is 96% on room air.  NEUROLOGIC:  No new  neurologic findings in the lower extremities, including motor, sensory, and  reflexes.   IMPRESSION:  1. 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  2. Lumbar facet arthropathy.  3. Peripheral vascular disease.  4. Diabetes mellitus.   PLAN:  1. I discussed treatment options at length with the patient and his wife.  I     recommend a trial of lumbar epidural steroid injections to decrease his     neurogenic claudication symptoms.  I discussed the lumbar epidural     steroid injection with  him at length, including the risks, benefits,     limitations, alternatives, and potential side effects, and all questions     were answered.  We also discussed alternatives to the injection such as     physical therapy and medications.  At this point, the patient wishes to     proceed with the lumbar epidural steroid injection.  2. Increase hydrocodone to 7.5 mg/500 mg one p.o. t.i.d. p.r.n. #50 without     refills.  He has been using this medication sparingly and appropriately.  3. The patient is to return to clinic for lumbar epidural steroid injection.     I anticipate this to be early next week.   The patient was educated on the above findings and recommendations and  understands.  There were no barriers  to communication.  This was a 25 minute  consultation.                                               Zachary George, DO    JW/MEDQ  D:  08/21/2002  T:  08/21/2002  Job:  [id number]   cc:   [name], M.D.  [address]  [place], [place] [address]  Fax: [phone]

## 2010-10-10 NOTE — Discharge Summary (Signed)
[name], [name]              ACCOUNT NO.:  [id number]   MEDICAL RECORD NO.:  [id number]          PATIENT TYPE:  INP   LOCATION:  6526                         FACILITY:  [facility]   PHYSICIAN:  Madaline Savage, M.D.DATE OF BIRTH:  08-23-1933   DATE OF ADMISSION:  06/02/2004  DATE OF DISCHARGE:  06/06/2004                                 DISCHARGE SUMMARY   [name] is a 77 year old white married male patient of Dr. Chanda Busing  with known coronary artery disease, he has some type of Alzheimer's disease.  He came to the emergency room with nausea for 2-3 days.  He had shortness of  breath for about one week and substernal chest pain and epigastric  discomfort.  Thus, it was decided he should be admitted and ruled out for  pulmonary embolus, dissection, and myocardial infarction.  He underwent a CT  of his chest and abdomen.  The CT of his chest did not show any pulmonary  embolus, he did have some bilateral nodular opacities in his lungs.  It was  felt these were most likely post infectious and inflammatory in etiology but  they recommended CT of his chest in approximately three months to see if  there any changes or if these nodules were stable.  The CT of his abdomen is  negative for any dissection, thus it was decided that he should undergo  cardiac catheterization.  His Coumadin was held, he was put on heparin when  his INR was less than 2.  On June 04, 2004, he underwent cardiac  catheterization by Dr. Chanda Busing.  He was found to have high grade  stenosis in his right coronary artery, his left internal mammary artery to  his LAD was OK, his SVG to his OM1 was OK, thus he underwent two overlapping  Cypher stents 2.5 by 39.  On June 05, 2004, he was fairly stable and it  was decided that his Coumadin would be restarted on June 05, 2004.  He  would stay on aspirin, Plavix, and Coumadin. His aspirin would be  discontinued in 3-4 days and he says he will stop his  aspirin on Monday.  He  will continue on Plavix and Coumadin.  He will need to be on his Plavix for  at least six months and this was discussed with Dr. Elsie Lincoln.   Labs on June 05, 2004, hemoglobin 12.1, hematocrit 35, WBC 8.3, platelets  202, sodium 136, potassium 4.3, chloride 105, CO2 25, BUN 17, creatinine  1.5.  Cardiac CK MB were all negative.  His troponins were all negative.   DISCHARGE MEDICATIONS:  Aspirin 81 mg until Monday only.  Coumadin 2.5 mg  one time per day, Plavix 75 mg one time per day.  He must be on this for at  least six months and he should not stop it.  Zetia 10 mg one time per day,  Lipitor 40 mg one time per day, Colchicine 0.6 mg one time per day,  Verapamil 240 mg in the morning, Verapamil 120 mg at night, folic acid 1 mg  1/2 tab one time  per day, Benicar 40/12.5 mg one time per day, Glucotrol 5  mg one time per day, Hytrin 2 mg one time per day, Neurontin 100 mg one time  per day, Lexapro 10 mg one time per day, Prilosec 20 mg one time per day,  Niacin 150 mg 2 tablets per day.   DISCHARGE INSTRUCTIONS:  He should do no strenuous activities, no pushing,  pulling, or lifting x 1 week.  He should be on a low fat diet.  He will  follow up with Dr. Elsie Lincoln on January 31 at 12 noon.   DISCHARGE DIAGNOSIS:  1.  Unstable angina.  2.  Progressive coronary artery disease status post cardiac catheterization      on June 04, 1998, with progressive disease in his native right      coronary artery of 75%, he had two Cypher stents placed which were      overlapping 2.5 by 39, his LIMA to his LAD was OK, and his SVG to his OM      was OK.  He had undergone coronary artery bypass grafting in 1997.  3.  History of PAF intolerant to beta blockers and Rythmol aggravates his      Alzheimer's disease.  4.  Anticoagulation.  He is back on his Coumadin.  He will be on Coumadin      and Plavix.  His aspirin will be stopped on Monday.  5.  Alzheimer's disease.  6.  Non  insulin dependent diabetes mellitus.  7.  Hyperlipidemia.  8.  Hypertension.  9.  History of gout.  10. Normal ejection fraction.   His INR should be checked on Tuesday of next week.  This is followed by Dr.  Jeannetta Nap.      BB/MEDQ  D:  08/19/2004  T:  08/19/2004  Job:  [id number]   cc:   [name], M.D.  [address]  [place], [place] [address]  Fax: [phone]

## 2010-10-10 NOTE — Discharge Summary (Signed)
[place]  Patient:    [name], [name]                     MRN: [id number] Adm. Date:  [id number] Disc. Date: [id number] Attending:  Ophelia Shoulder Dictator:   Mancel Bale, P.A. CC:         Madaline Savage, M.D., [facility]                           Discharge Summary  ADMISSION DIAGNOSES:  1. Palpitations.  2. Coronary artery disease, status post coronary artery bypass grafting.  3. Hypertension.  4. Hyperlipidemia.  5. Obesity.  6. History of palpitations and premature ventricular contractions treated with  Rythmol.  DISCHARGE DIAGNOSES:  1. Palpitations, resolved.  2. Coronary artery disease, status post coronary artery bypass grafting.  3. Hypertension.  4. Hyperlipidemia.  5. Obesity.  6. History of palpitations and premature ventricular contractions treated with  Rythmol.  7. Status post atypical chest pain with normal enzymes, normal electrocardiogram.  HISTORY OF PRESENT ILLNESS:  [name] is a 77 year old white male with a history of CAD, status post CABG, hypertension, hyperlipidemia, obesity, and history of  palpitations with PVCs, treated with Rythmol, who presented to [facility] on  August 01, 1999 with complaints of palpitations.  He stated that approximately four hours prior to his presentation, he had been outside walking around when he had the onset of palpitations.  He states that the palpitations were constant for the last four hours, but then had resolved upon he interview in the ER.  He states that he also felt as if he had a "choking" sensation in his throat and had pressure across his chest.  He describes it as  tightness and "like somebody blew me up."  As well, he had had some nausea, but no vomiting and no increased shortness of breath.  He did have some diaphoresis. e states that all of those symptoms had lasted for about four hours.  He took a nitroglycerin  sublingual x 1 at about 12 noon.  He states that this caused a headache, so he took no further nitroglycerins.  At this time it was about 1:25  p.m. and he had not had is 1 oclock dose of Rythmol.  He states that his past episodes of palpitation often lasted about 1/2 day.  He  states today was different because he "felt more sick" secondary to the associated symptoms.  He states he did not have this type of chest pressure and choking sensation with his previous angina.  He only has had chest pain when he has had  palpitations.  At this point the palpitations have resolved and his EKG now revealed normal sinus rhythm with no ectopy.  We plan to admit him for 23-hour observation, check serial enzymes to rule out myocardial infarction and to continue telemetry for observation of dysrhythmia.  HOSPITAL COURSE:  On the morning of August 02, 1999, [name] had no complaints. He had had no further palpitations or chest pressure.  He was hemodynamically stable and exam benign.  Telemetry was revealing sinus bradycardia with no dysrhythmia.  His enzymes had been negative x 2.  It was felt that he was stable for discharge home at this time with a follow-up outpatient Cardiolite stress test. He would be continued on his Rythmol for PVCs.  HOSPITAL CONSULTS:  None.  HOSPITAL PROCEDURES:  None.  EKG revealed normal sinus rhythm, 60 beats per minute, no ectopy, and no other change.  RADIOLOGY:  Chest x-ray on August 01, 1999 shows cardiomegaly with no active disease.  LABORATORY:  Cardiac enzymes reveal CK of 159 and 114.  CK-MB 1.2 and 1.0. Relative index 0.8 and 0.9.  Troponin less than 0.03 x 2.  His CBC and metabolic profile were completely normal.  DISCHARGE MEDICATIONS:  1. Rythmol 225 mg 1 every 8 hours.  2. Enteric-coated aspirin 325 mg once a day.  3. Prilosec as before.  4. Lipitor 10 mg once a day.  5. Verapamil 240 mg once a day.  6. Demadex 20 mg 1 every other  day.  7. Ibuprofen and nitroglycerin sublingual 0.4 mg as needed.  DISCHARGE INSTRUCTIONS:  No strenuous activity until he sees Dr. Elsie Lincoln back in the office.  I told him that I would call the office on Monday to schedule his appointments, but if he did not hear from them within the next 1-2 days, he would need to call the office at [phone] to schedule the following:  1. A Persantine Cardiolite.  2. Appointment with Dr. Elsie Lincoln following up the Cardiolite.DD:  08/07/99 TD:  08/07/99 Job: [id number]

## 2010-10-10 NOTE — H&P (Signed)
[name], [name]              ACCOUNT NO.:  [id number]   MEDICAL RECORD NO.:  [id number]          PATIENT TYPE:  EMS   LOCATION:  MAJO                         FACILITY:  [facility]   PHYSICIAN:  Ulyses Amor, MD DATE OF BIRTH:  Dec 17, 1933   DATE OF ADMISSION:  07/20/2006  DATE OF DISCHARGE:                              HISTORY & PHYSICAL   [name] is a 77 year old white man who is admitted to [facility] for further evaluation of chest pain.   The patient has a history of coronary artery disease which dates back to  60.  At that time, he underwent coronary artery bypass surgery.  He  received a left internal mammary artery graft to the LAD and a saphenous  vein graft to the first obtuse marginal.  In 2006, he returned for  stenting of the right coronary artery.  His cardiac course has been  uncomplicated since then.  He has suffered no myocardial infarction.  He  has normal left ventricular function with congestive heart failure and  he has suffered no cardiac arrhythmias.   The patient experienced several episodes of chest pain this evening  while washing dishes.  These occurred over a period of 90 minutes.  The  chest pain was described as a sharp, flashing and fleeting discomfort in  his left anterior chest.  He experienced a series of these chest pain  over this period of time.  He took nitroglycerin at home which did not  appear to effect his chest pain; these were old nitroglycerin tablets.  When EMS arrived, they gave him further nitroglycerin.  These seem to  result in resolution of the chest pain.  The patient has had no further  recurrence of chest pain; he is asymptomatic at this time.  The chest  pain occurred at random and appeared to be unrelated to position,  activity, meals or respirations.  There was no associated dyspnea,  diaphoresis or nausea.  There were no exacerbating or ameliorating  factors.  He believes that this chest pain was  different in quality from  his prior cardiac chest pain.  The chest pain tonight did not radiate.   The patient has a number of risk factors for coronary artery disease  including hypertension, dyslipidemia, noninsulin-dependent diabetes  mellitus and a family history of coronary artery disease (both parents).  The patient smoked in the remote past.  His only other medical problem  is gout.   MEDICATIONS:  Colchicine, folic acid, Lexapro, verapamil, Zetia and  aspirin.   ALLERGIES:  SULFA.   OPERATIONS:  1. Coronary artery bypass surgery.  2. Bilateral cataracts.   SOCIAL HISTORY:  The patient is retired.  He lives with his wife.  He  neither smokes cigarettes nor drinks alcohol.   REVIEW OF SYSTEMS:  Reveals no new problems related to his head, eyes,  ears, nose, mouth, throat, lungs, gastrointestinal system, genitourinary  system or extremities.  There is no history of neurologic or psychiatric  disorder.  There is no history of fever, chills or weight loss.   PHYSICAL EXAMINATION:  Blood pressure  135/51, pulse 65 and regular,  respirations 20, temperature 98.1.  The patient was an obese, elderly  white man in no discomfort.  He was alert, oriented, appropriate and  responsive.  Head, eyes, nose and mouth were normal.  The neck was  without thyromegaly or adenopathy.  Carotid pulses were palpable  bilaterally and without bruits.  Cardiac examination revealed a normal  S1-S2.  There was no S3, S4, murmur, rub or click.  Cardiac rhythm was  regular.  No chest wall tenderness was noted.  The lungs were clear.  The abdomen was soft and nontender.  There was no mass,  hepatosplenomegaly, bruit, distention, rebound, guarding or rigidity.  Bowel sounds were normal.  Rectal and genital examinations were not  performed as they were not pertinent to the reason for acute care  hospitalization.  The extremities were without edema, deviation or  deformity.  Radial and dorsalis pedis  pulses were palpable bilaterally.  Brief screening neurologic survey was unremarkable.   The chest radiograph, according to the radiologist, demonstrated mild  cardiomegaly.  All laboratory studies were pending at the time of this  dictation.  The electrocardiogram was not available at this time but  will be reviewed and noted on the handwritten admission note.   IMPRESSION:  1. Chest pain:  Rule out unstable angina.  2. Coronary artery disease, status post coronary artery bypass surgery      in 1997 with a left internal mammary artery graft to the left      anterior descending artery and a saphenous vein graft to the first      obtuse marginal.  Status post stenting of the right coronary artery      in 2006.  3. Hypertension.  4. Dyslipidemia.  5. Noninsulin-dependent diabetes mellitus.  6. Gout.   PLAN:  1. Telemetry.  2. Serial cardiac enzymes.  3. Aspirin.  4. Intravenous heparin.  5. Intravenous nitroglycerin.  6. Chest CT.  7. Further measures per Dr. Elsie Lincoln.      Ulyses Amor, MD  Electronically Signed     MSC/MEDQ  D:  07/20/2006  T:  07/20/2006  Job:  [id number]   cc:   Madaline Savage, M.D.

## 2010-10-10 NOTE — Procedures (Signed)
[facility]                                 ULTRASOUND STUDY   NAME:[name], [name]                     MRN:          [id number]  DATE:11/27/2005                            DOB:          Jan 13, 1934    REFERRING PHYSICIAN:  DAVID PATTERSON   PROCEDURE:  Abdominal ultrasound.   GASTROENTEROLOGIST:  Venita Lick. Russella Dar, M.D.   RESULTS OF ULTRASOUND:  Aorta:  1.6 cm x 1.5 cm.  The abdominal aorta is  normal as per the template.  The IVC is patent.  The body of the pancreas is  normal as per the template.  The head and tail of the pancreas are not  imaged.  The gallbladder is normal as per the template. The common bile duct  measures 7.6 mm at the upper limits of normal and it is normal as per the  template.  In the liver there is a mild to moderate increase in echo  density.  The remainder of the exam of the liver is normal as per the  template.  The right kidney measures 10.3 cm, the left kidney measures 10.1  cm.  Both kidneys are normal as per the template.  The spleen measures 10.7  cm and is normal as per the template.   IMPRESSION:  1.  Mild to moderate increase in hepatic echo density.  2.  Limited pancreatic imaging.  3.  Common bile duct measuring at upper limits of normal.   RECOMMENDATIONS/CLINICAL CORRELATION:  Per Dr. Vania Rea. Jarold Motto.                                   Venita Lick. Pleas Koch., MD, Clementeen Graham, FACP   MTS/MedQ  DD:  12/01/2005  DT:  12/01/2005  Job #:  [id number]   cc:   Vania Rea. Jarold Motto, MD, Clementeen Graham, [place]

## 2010-10-10 NOTE — Op Note (Signed)
   [name], [name]                        ACCOUNT NO.:  [id number]   MEDICAL RECORD NO.:  [id number]                   PATIENT TYPE:  REC   LOCATION:  TPC                                  FACILITY:  [facility]   PHYSICIAN:  Zachary George, DO                      DATE OF BIRTH:  [birth year]   DATE OF PROCEDURE:  08/24/2002  DATE OF DISCHARGE:                                 OPERATIVE REPORT   PROCEDURE:  Lumbar epidural steroid injection.   INDICATIONS:  Spinal stenosis of the lumbar spine with neurogenic  claudication.   DESCRIPTION OF PROCEDURE:  The procedure was described to the patient in  detail including the risks, benefits, limitations, alternatives, and  potential side effects.  The risks include but are not limited to bleeding,  infection,  nerve injury, spinal headache, paralysis, failure to relieve  pain, increased pain, and potential allergic reaction to medications.  The  patient wishes to proceed.   Informed consent was obtained.  The patient was brought back to the  fluoroscopy suite and placed on the table in the prone position.  The skin  was prepped and draped in the usual sterile fashion.  The skin and  subcutaneous tissues were anesthetized with 3 mL of preservative-free 1%  lidocaine.  Under direct fluoroscopic guidance an 18-gauge, 3-1/2-inch  Hustead needle was advanced into the left paramedian L5-S1 epidural space  with loss-of-resistance technique.  There was no CSF, heme, or paresthesia  noted.  This was then followed by injection of 1 mL of Kenalog 40 mg/mL plus  3 mL of normal saline with needle flush.  There were no complications.  The  patient tolerated the procedure well.  Discharge instructions were given.  Of note, the patient has a partially lumbarized S1 vertebra.  The patient  was monitored and released in stable condition.   The patient was educated on the above findings and recommendations and  understands.  There were no barriers to  communication.                                               Zachary George, DO    JW/MEDQ  D:  08/24/2002  T:  08/24/2002  Job:  [id number]   cc:   [name], M.D.  [address]  [place], [place] [address]  Fax: [phone]

## 2010-10-10 NOTE — Discharge Summary (Signed)
[name], [name]              ACCOUNT NO.:  [id number]   MEDICAL RECORD NO.:  [id number]          PATIENT TYPE:  INP   LOCATION:  2029                         FACILITY:  [facility]   PHYSICIAN:  [name], M.D.DATE OF BIRTH:  [birth year]   DATE OF ADMISSION:  07/20/2006  DATE OF DISCHARGE:  07/22/2006                               DISCHARGE SUMMARY   DISCHARGE DIAGNOSES:  1. Chest pain, ruled out for myocardial infarction during this      admission.  2. Known coronary artery disease, status post coronary artery bypass      graft in 1997.  3. Left catheterization in 2006 with stenting of the right coronary      artery.  4. Hypertension.  5. Dyslipidemia.  6. Noninsulin dependent diabetes mellitus.  7. Gout.  8. Alzheimer disease.   HPI/HOSPITAL COURSE:  This is a 77 year old gentleman, patient of Dr.  Elsie Lincoln, who presented to the emergency room with complaints of chest  pain.  He experienced a series of chest pain over the period of 90  minutes when he was washing dishes and took nitroglycerin without any  significant relief of symptoms.  Patient presented to the emergency  room, but at the time of assessment he was already pain free.  Decision  was made to admit him to the hospital, rule out for MI and schedule for  a nuclear study if MI is negative.   The next morning, he was assessed by Dr. Elsie Lincoln.  His enzymes were  negative x3.  Patient was pain free and we scheduled him for a  Cardiolite stress test in the hospital and it was suppose to be a  Persantine, but due to his acute wheezing and shortness of breath, the  Persantine was substituted by dobutamine.  Patient underwent the test,  which was performed without any complications.  The next morning, he was  assessed by Dr. Jacinto Halim, his stress test showed negative result.  There  was no ischemia noted on the study and ejection fraction was normal.  Presumably, it was thought that his chest pain could be related to  the  gastroesophageal reflux disease.  Patient was started on an increased  dose omeprazole 20 mg b.i.d. and was considered to be stable for  discharge home.   HOSPITAL LABORATORIES:  As mentioned, his enzymes were negative.  His  hemoglobin was 12.6, hematocrit 35.7, white blood cell count 6.2,  platelets 174.  Sodium 137, potassium 3.8, chloride 99, CO2 28, BUN 41,  creatinine 2.7, platelets 197.   DISCHARGE INSTRUCTIONS:  He is to increase activity slowly.  Stay on a  low-fat, low-cholesterol diet.   MEDICATIONS:  1. Lipitor 40 mg daily.  2. Aspirin 81 mg daily.  3. Colchicine 0.6 mg daily.  4. Folic acid 400 mcg daily.  5. Lexapro 10 mg daily.  6. Zetia 10 mg daily.  7. Glipizide XL 15 mg every morning.  8. Terazosin 10 mg daily.  9. Fish oil 1000 mg daily.  10.Benicar HCT 40/12.5 mg daily.  11.Niacin 1000 mg b.i.d.  12.Omeprazole 20 mg b.i.d.  13.Verapamil 240 mg  daily.  14.Gabapentin 100 mg daily.   DISCHARGE FOLLOWUP:  Our office will call patient to schedule an  appointment with Dr. Elsie Lincoln.      [name], P.A.    ______________________________  [name], M.D.    MK/MEDQ  D:  07/22/2006  T:  07/22/2006  Job:  [id number]

## 2010-10-10 NOTE — Assessment & Plan Note (Signed)
[facility]                             PULMONARY OFFICE NOTE   NAME:[name], [name]                     MRN:          [id number]  DATE:06/24/2006                            DOB:          1933/09/22    PROBLEM:  A 77 year old man referred in pulmonary consultation at the  kind request of Dr. Elsie Lincoln, with complaint of coughing and wheezing  spells.  Dr. Jeannetta Nap is his primary physician.  He had been seen at this  office in July of 2007 by Dr. Yancey Flemings for gastroenterology, for  complaint of epigastric pain, which has apparently resolved.  In May of  2007, he had a bronchitis which persisted for nearly 6 months with cough  worst at night, sometimes until he would wretch.  He never had tussive  syncope and produced only scant white phlegm.  Chest x-ray was said to  be okay.  He noted occasional sweat but no definite fever.  Asmanex  seemed to help some.  Albuterol tablets were tried but caused jitters.  Similar symptoms have persisted but may be a little more episodic with  no clear trigger.   MEDICATIONS:  1. Colchicine 0.6 mg.  2. Verapamil 240 mg.  3. Zetia 10 mg.  4. Folic acid.  5. Multivitamins.  6. Niacin 200 mg x2 b.i.d.  7. Lexapro 10 mg.  8. Fish oil.  9. Aspirin 81 mg.  10.Benicar 40 mg.  11.Glipizide b.i.d.  12.Lipitor 40 mg.  13.Gabapentin 100 mg.  14.Furosemide 40 mg.  15.Terazosin.  16.Ranitidine.  17.P.r.n. use of Tylenol and albuterol 4-mg tablets, drug intolerant      of sulfa.   REVIEW OF SYSTEMS:  Shortness of breath with activity and while resting  at night, productive cough, mostly scant white sputum, acid indigestion.  Weight has been stable, occasional palpitations, sleeps on two pillows,  nocturia x 2-3, loud snoring.  He had had a sleep study at [facility], reportedly fine, but wife says she thinks he stops  breathing occasionally.  His nose was clear.  Feet swell, right more  than left.  Hip  pain limits activity, walking hills and stairs  especially.  These make him very short of breath and start him coughing.  No exertional chest pain.   PAST MEDICAL HISTORY:  Asthma as a child, pneumonia at age 4 with  pleurisy for which he was hospitalized for one week, esophageal reflux,  elevated cholesterol, diabetes, irregular heart rhythm, hypertension.  No myocardial infarction, but he had coronary bypass in the past.   SOCIAL HISTORY:  Quit smoking 1992, married with children, lives with  his wife.  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  Apparently, this was something the stems were  put in.   FAMILY HISTORY:  Both parents died of myocardial infarction.  Others in  the family had asthma.   PULMONARY FUNCTION TESTS:  The PFT done April 08, 2006 at [facility]  was interpreted as mild obstructive airways disease.  FEV1 was 77% of  predicted, with insignificant response to bronchodilator, normal  measured lung volumes and diffusion capacity.   OBJECTIVE:  VITAL SIGNS:  Weight 236 pounds, BP 128/58, pulse regular  61, room air saturation 96%.  GENERAL:  This is an obese gentleman who seems comfortable at this time.  SKIN:  Clear, no adenopathy found.  HEENT:  Full dentures, long palate, 4/4.  Voice quality is normal.  No  stridor, no neck vein distention.  CHEST:  Wheeze and cough, not labored at rest.  No rhonchi, no dullness.  HEART:  Sounds regular.  No murmur or gallop is heard.  EXTREMITIES:  No cyanosis or clubbing.  There is trace edema.   VACCINATION HISTORY:  He had a first pneumococcal vaccine many years  ago, flu vaccine this fall.   IMPRESSION:  1. Asthmatic bronchitis.  2. Exogenous obesity.  3. Esophageal  reflux.  4. Coronary artery disease status post coronary artery bypass graft.   PLAN:  1. He will continue reflux precautions, including omeprazole and      ranitidine.  2. Pneumococcal vaccine booster.  3. He will continue his bronchodilator medications, and we are going      to schedule return in one month, earlier p.r.n.  We will decide      going forward, if he needs additional studies or medication      changes.   I appreciate chance to meet this gentleman.     Clinton D. Maple Hudson, MD, Tonny Bollman, FACP  Electronically Signed    CDY/MedQ  DD: 06/26/2006  DT: 06/26/2006  Job #: [id number]   cc:   Madaline Savage, M.D.  [name], M.D.

## 2010-10-10 NOTE — Assessment & Plan Note (Signed)
[facility]                             PULMONARY OFFICE NOTE   NAME:[name], [name]                     MRN:          [id number]  DATE:07/27/2006                            DOB:          10-Dec-1933    PROBLEM:  1. Asthmatic bronchitis.  2. Exogenous obesity.  3. Esophageal reflux.  4. Coronary disease/bypass graft.  5. Diabetes.  6. Renal insufficiency.  7. Gout.  8. Alzheimer's.   HISTORY:  He had been hospitalized February 26 through 28 by Dr. Elsie Knox  with chest pain, which ruled out for an MI.  Renal insufficiency was  noted with a BUN of 41 and a creatinine of 2.17.  Reflux was questioned,  and his omeprazole dose was increased.  We reviewed pulmonary function  tests from November 2007, which had shown mild obstructive disease with  an FEV1 77% of predicted.  Small airway flows were slower.  Bronchodilator response was insignificant.  Diffusion capacity was 87%  of predicted.  He had run out of Spiriva, and notices it does make a  difference.  Previously failed to tolerate Advair.  Little sputum now.  Breathing feels stable.  Chest x-ray on February 9th at the hospital  showed mild cardiomegaly without acute disease.  Heart size slightly  enlarged.  Postoperative CABG changes noted.   MEDICATIONS:  Discharge medication list:  1. Lipitor 40 mg.  2. Aspirin 81 mg.  3. Colchicine 0.6 mg.  4. Folic acid 400 mcg.  5. Lexapro 10 mg.  6. Zetia 10 mg.  7. Glipizide XL 15 mg.  8. Terazosin 10 mg.  9. Fish oil 1000 mg.  10.Benicar hydrochlorothiazide 40/12.5.  11.Niacin 1000 mg b.i.d.  12.Omeprazole 20 mg b.i.d.  13.Verapamil 240 mg.  14.Gabapentin 100 mg.   OBJECTIVE:  Weight 237 pounds.  BP 132/68.  Pulse regular at 66.  Room  air saturation 96%.  He seems in no distress, but has a dry cough, sounding somewhat  congested with no focal findings.  No dullness.  Breath sounds generally  are diminished.  Work of breathing is not  increased.  HEART:  Sounds were regular without murmur heard.  There is significant abdominal obesity, which is likely to be crowding  his inspiratory effort some.  No peripheral edema.   IMPRESSION:  Mild chronic obstructive pulmonary disease, currently  stable.  There will be respiratory symptoms reflecting symptoms  potential for reflux and deconditioning with obesity.   PLAN:  Mucinex.  Refill Spiriva.  Consider repeat PFT on return in 4  months, earlier p.r.n.     Clinton D. Maple Hudson, MD, Tonny Bollman, FACP  Electronically Signed    CDY/MedQ  DD: 07/28/2006  DT: 07/28/2006  Job #: [id number]   cc:   [name], M.D.  Madaline Savage, M.D.

## 2010-10-10 NOTE — Discharge Summary (Signed)
NAMEPATRICK, Knox                        ACCOUNT NO.:  [id number]   MEDICAL RECORD NO.:  [id number]                   PATIENT TYPE:  INP   LOCATION:  2017                                 FACILITY:  [facility]   PHYSICIAN:  Madaline Savage, M.D.             DATE OF BIRTH:  09-09-1933   DATE OF ADMISSION:  03/08/2003  DATE OF DISCHARGE:  03/14/2003                                 DISCHARGE SUMMARY   DISCHARGE DIAGNOSES:  1. Paroxysmal atrial fibrillation, discharged in sinus rhythm on an     increased dose of Rhythmol.  2. Coronary artery disease, coronary artery bypass grafting in 1997, last     Cardiolite one year ago.  3. Non-insulin dependent diabetes.  4. Treated hypertension.  5. Renal insufficiency, creatinine 1.6 at discharge.  6. Hyperlipidemia.  7. Peripheral vascular disease with bilateral carotid bruits and known lower     extremity arterial disease.   HOSPITAL COURSE:  The patient is a 77 year old male followed by Dr. Elsie Lincoln  and Dr. Jeannetta Nap with a history of coronary artery bypass grafting in 1997.  His last Cardiolite was one year ago.   The patient was admitted on March 09, 2003, with palpitations, he denied  chest pain.  He says his symptoms started after he started taking Tricor.  After admission he did go into atrial fibrillation that was rapid.  We added  beta blocker.  After admission it was also determined that the patient had  actually been on Rhythmol once a day at home, although he did not relate  this to [place] on admission.  His Rhythmol was increased to 150 mg t.i.d.  We  continued him on Toprol, and he was on a high dose of Calan from his home  medicines which was 240 b.i.d.  He did go in and out of atrial fibrillation,  and we decided to put him on Coumadin and heparin.   We had planned to proceed with a diagnostic catheterization, but he had  renal insufficiency, and ultimately it was decided to hold off on a cath and  proceed with an outpatient  Cardiolite study.  We feel he can be discharged  on March 14, 2003.  He will go home on Lovenox, baby aspirin, and Coumadin  7.5 mg a day.   DISCHARGE MEDICATIONS:  1. Lovenox 100 mg b.i.d.  2. Baby aspirin 81 mg a day.  3. Cozaar 50 mg a day.  4. Glucotrol 5 mg a day.  5. Calan has been decreased to 360 mg a day.  6. Hydrochlorothiazide 12.5 mg a day.  7. Lipitor 40 mg a day.  8. Zetia 10 mg a day.  9. Rhythmol 150 mg three times a day.  10.      Prilosec 20 mg a day.  11.      Colchicine 0.6 mg a day.  12.      Coumadin 7.5 mg a day  or as directed.  13.      We have stopped his Tricor and his Metformin and his Demodex.  His     creatinine was 1.9 on admission, and this dropped to 1.6.  14.      We did resume his Cozaar and hydrochlorothiazide at discharge.   FOLLOW UP:  He will need a followup BNP as an outpatient.  He can have this  done Friday when he has his prothrombin time checked.  He has an appointment  with Dr. Elsie Lincoln on April 03, 2003.      Abelino Derrick, P.A.                      Madaline Savage, M.D.    Lenard Lance  D:  03/14/2003  T:  03/14/2003  Job:  [id number]

## 2010-10-10 NOTE — Consult Note (Signed)
   [name]                        ACCOUNT NO.:  [id number]   MEDICAL RECORD NO.:  [id number]                   PATIENT TYPE:  REC   LOCATION:  TPC                                  FACILITY:  [facility]   PHYSICIAN:  Zachary George, DO                      DATE OF BIRTH:  1933-06-27   DATE OF CONSULTATION:  08/24/2002  DATE OF DISCHARGE:                                   CONSULTATION   HISTORY OF PRESENT ILLNESS:  The patient returns to the clinic today as  scheduled for a lumbar epidural steroid injection for spinal stenosis of the  lumbar spine with neurogenic claudication.  He was last seen on August 21, 2002.  He denies any new neurologic complaints.  His pain is rated as a 2/10  on a subjective scale but, again, this increases significantly with  ambulation.  I reviewed the health and history form and 14-point review of  systems.   PHYSICAL EXAMINATION:  GENERAL:  Healthy male in no acute distress.  VITAL SIGNS:  Blood pressure 183/68, pulse 62, respirations 18, O2  saturations 96% on room air.   IMPRESSION:  1. 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  2. Lumbar facet arthropathy.  3. Peripheral vascular disease.  4. Diabetes mellitus.   PLAN:  1. Lumbar epidural steroid injection, with the goal of decreasing radicular     symptoms secondary to neurogenic claudication from spinal stenosis of the     lumbar spine.  2. Continue hydrocodone as needed for pain.  3. Patient to return to clinic in two weeks for reevaluation and possible     repeat lumbar epidural steroid injection as predicated upon the patient's     response and symptoms.  If the patient is not getting any significant     relief with epidural injections, would consider lumbar facet injections     as he does have lumbar facet arthropathy as well.   The patient was educated on the above findings and recommendations and  understands.  There were no barriers to  communication.                                               Zachary George, DO    JW/MEDQ  D:  08/24/2002  T:  08/24/2002  Job:  [id number]   cc:   [name], M.D.  [address]  [place], [place] [address]  Fax: [phone]

## 2010-10-10 NOTE — Consult Note (Signed)
[name], PLAIN                        ACCOUNT NO.:  [id number]   MEDICAL RECORD NO.:  [id number]                   PATIENT TYPE:  OUT   LOCATION:  MRI                                  FACILITY:  [facility]   PHYSICIAN:  Zachary George, DO                      DATE OF BIRTH:  14-Apr-1934   DATE OF CONSULTATION:  DATE OF DISCHARGE:  08/08/2002                                   CONSULTATION   HISTORY OF PRESENT ILLNESS:  Mr. Jaco returns to clinic today for  reevaluation.  He was last seen on 08/24/02, at which time he underwent a  lumbar epidural steroid injection for spinal stenosis of his lumbar spine  with neurogenic claudication.  The patient returns today reporting that he  has nearly complete resolution of his lower back pain and lower extremity  radicular symptoms, and his function and quality of life indices have  improved greatly.  His range of motion has improved.  He is very happy with  his current pain relief and his improvement.  He is no longer taking any  medication for pain and has some pills remaining in case he needs them.  I  reviewed health and history form and 14 point review of systems.  His pain  is rated as a 1/10 on a subjective scale today; however, he states that his  pain has resolved with the injection.   PHYSICAL EXAMINATION:  GENERAL:  A healthy-appearing male in no acute  distress.  The patient is pleasant and very happy today.  VITAL SIGNS:  Blood pressure 195/88, pulse 73, respirations 20, O2  saturations 97% on room air.  NEUROLOGIC:  Normal motor strength in the lower extremities bilaterally.  Sensory examination is intact to light touch bilateral lower extremities.  Muscle stretch reflexes are 2+/4 bilateral patellar and 0/4 bilateral medial  hamstrings and Achilles.   IMPRESSION:  1. Spinal stenosis of the lumbar spine with nearly complete resolution of     neurogenic claudication symptoms.  2. Chronic low back pain with significant  improvement and nearly complete     resolution.  3. Improved function and quality of life indices.   PLAN:  1. I discussed further treatment options with Mr. Flaharty.  We will forego     any further injections at this time given patient's nearly resolved     symptoms and significant improvement in function and quality of life     indices.  I would recommend considering repeat injection if symptoms     return back towards baseline which is certainly possible, and I explained     this to the patient, and he understands.  He understands that I will be     leaving this clinic and that his options are to return to this clinic for     further care versus referral by his primary care Hobart Marte to either  Mark     L. Vear Clock, M.D. or Richard D. Ramos, M.D. for further intervention.  2. The patient to follow up with his primary care Joe Tanney, [name],     M.D.  Blood pressure was a little high today, and he should follow up in     this regard.   The patient was educated on the above findings and recommendations and  understands.  No barriers to communication.                                               Zachary George, DO    JW/MEDQ  D:  09/07/2002  T:  09/07/2002  Job:  [id number]   cc:   [name], M.D.  [address]  [place], [place] [address]  Fax: [phone]

## 2010-10-10 NOTE — Consult Note (Signed)
[name], [name]                        ACCOUNT NO.:  [id number]   MEDICAL RECORD NO.:  [id number]                   PATIENT TYPE:  REC   LOCATION:  [facility]                                  FACILITY:  [facility]   PHYSICIAN:  Thomas George, DO                      DATE OF BIRTH:  1934/02/25   DATE OF CONSULTATION:  08/02/2002  DATE OF DISCHARGE:                                   CONSULTATION   REFERRING PHYSICIAN:  Windle Knox, M.D.   Dear Dr. Jeannetta Knox:   Thank you very much for kindly referring [name] to [facility] for consultation.  [name] was seen  in our clinic today.  Please refer to the following for details regarding  the history and physical examination and treatment recommendations.  Once  again, thank you for allowing [place] to participate in the care of [name].   CHIEF COMPLAINT:  Pain in hip and leg.   HISTORY OF PRESENT ILLNESS:  [name] is a pleasant 77 year old right-hand-  dominant male who is accompanied by his wife who complains of a several-week  history of low back pain radiating into his right anterolateral thigh and  anterior leg.  He states he was evaluated at the emergency department at  [facility] approximately 1-1/12 weeks ago where he had x-rays of his lumbar  spine and was told that he had arthritis and spurs.  He states that the  emergency room physician recommended an MRI of his lumbar spine but was  unable to order one.  Dr. Jeannetta Knox has kindly referred him to [facility] for consultation in regards to his low back  and lower extremity pain.  [name] also complains of numbness in his  lower extremities starting in his thighs with prolonged walking and relieved  promptly with sitting.  He has a known history of peripheral vascular  disease, stating that he was told he had mild blockages in his lower abdomen  as well as right greater than left lower extremities  but that it was not bad  enough for any surgical intervention at this point.  He has been taking  hydrocodone 5 mg/500 mg a few times per day with improvement in his pain  symptoms.  He does, however, not like taking too many medications.  His pain  today is a 4/10 on a subjective scale with associated numbness as needed.  His symptoms are worse with walking, bending, sitting, and improved with  medications to some degree.  In addition, he notes that he has taken  prednisone as needed for gout, and he notes that it also helped his leg  pain.  He denies any fevers, chills, night sweats, weight loss, bowel and  bladder dysfunction.  His functional and quality of life indices have  declined to some  degree secondary to inability to walk long distances  without onset of numbness in his legs.  His sleep has been fair to poor,  especially a few weeks back when his pain was more intense.  In addition, he  admits to heartburn, cataracts, ankle swelling, memory loss, stating that he  has early Alzheimer's, weakness in his lower extremity from time to time,  back pain.   PAST MEDICAL HISTORY:  1. Coronary artery disease.  2. Diabetes, which is diet-controlled.  3. Gout.  4. Hypertension.  5. Early Alzheimer's disease.  6. Peripheral vascular disease, as noted.   PAST SURGICAL HISTORY:  Coronary artery bypass graft.   FAMILY HISTORY:  Heart disease, diabetes, hypertension.   SOCIAL HISTORY:  The patient denies smoking, alcohol, or illicit drug use.  He denies history of alcohol or drug abuse.  He is married and not currently  working.  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   ALLERGIES:  SULFA DRUGS.   MEDICATIONS:  Omeprazole, Cozaar, verapamil, torsemide, Zetia, Rythmol,  Lipitor, aspirin, multivitamin, folic acid, and hydrocodone 5 mg as needed.  He has taken ibuprofen in the past without relief as well as Tylenol, which  he states helped  modestly.   PHYSICAL EXAMINATION:  GENERAL:  Healthy male in no acute distress.  The  patient is alert and oriented today.  Mood and affect are appropriate.  VITAL SIGNS:  Blood pressure is 184/61, pulse 67, respirations 18.  HEENT:  Unremarkable.  HEART:  Regular rate and rhythm.  Without gallops, murmurs, or rubs at this  time.  LUNGS:  Clear to auscultation bilaterally.  BACK:  Examination of the back reveals level pelvis without scoliosis.  There is normal lumbar lordosis.  There is minimal tenderness to palpation  of the lumbar paraspinal muscles.  Extension is somewhat limited, as is  extension plus rotation.  There is some discomfort, especially on the right,  with extension plus rotation.  The patient has full flexion, side-bending,  and rotation with minimal discomfort.  EXTREMITIES:  Manual muscle testing is 5/5 bilateral lower extremities.  Sensory examination is intact to light touch bilateral lower extremities  with the exception of a slight funny sensation described in the anterior  thighs bilaterally.  Muscle stretch reflexes are 2+/4 bilateral patellae and  0/4 bilateral medial hamstrings and Achilles.  Toes are downgoing  bilaterally.  There is no ankle clonus noted.  No abnormal tone in the lower  extremities.  Straight leg raise is negative bilaterally, but the patient is  noted to have significantly tight hamstrings.  Pearlean Brownie is negative bilaterally  but with tight hip flexors.  There is minimal pretibial edema on the right  lower extremity without any heat or erythema.  There are mild chronic stasis  changes noted.  Dorsalis pedis pulse is present on the left and negative on  the right.  Feet are warm to the touch bilaterally.   IMPRESSION:  1. Low back pain with right lower extremity radicular pain and bilateral     lower extremity numbness and paresthesias.  The patient's history is     somewhat consistent with spinal stenosis with neurogenic claudication;     however, cannot rule out lumbar facet arthropathy or possibly even nerve     root impingement as contributing factors.  2. Peripheral vascular disease, although the patient's symptoms are more     consistent with neurogenic claudication than vascular claudication.  3. Diabetes  mellitus.   RECOMMENDATIONS:  1. Will try to obtain records to include x-ray reports from [facility].  2. It is reasonable to proceed with an MRI of his lumbar spine without     contrast to rule out stenosis or herniated disk, and will go ahead and     order this.  3. Recommend continuing with hydrocodone 5 mg, and a prescription for Norco     5 mg/325 mg, 1 p.o. three times daily, #30 without refills was provided.  4. Patient to return to clinic after the MRI for further evaluation.     Further recommendations will be made at that time.   The patient was educated on the above findings and recommendations and  understands.  There were no barriers to communication.                                               Thomas George, DO    JW/MEDQ  D:  08/02/2002  T:  08/03/2002  Job:  [id number]   cc:   [name], M.D.  [address]  [place], [place] [address]  Fax: [phone]

## 2010-10-10 NOTE — Assessment & Plan Note (Signed)
[name]                             PULMONARY OFFICE NOTE   NAME:[name], [name]                     MRN:          [id number]  DATE:07/27/2006                            DOB:          1933-11-24    PROBLEM LIST:  1. Asthmatic bronchitis.  2. Exogenous obesity.  3. Esophageal reflux.  4. Coronary disease/bypass graft.  5. Hypertension.  6. Diabetes.  7. Alzheimer's.   HISTORY:  He was in the hospital at the end of February to rule out MI  with enzymes negative and a negative stress test.  Chest pain was  thought to be related to reflux and omeprazole was increased to b.i.d.  His wife had called in mid February complaining of thrush and saying  Advair was not helping.  We had substituted Spiriva and given a single  dose of Diflucan at that time.  He ran out of the Spiriva but felt that  definitely helped.  Throat has been better.  Chest x-ray report from the  hospital on July 01, 2006, described mild cardiomegaly without acute  disease.  Lungs were clear.  Postoperative sternotomy changes were seen.  Blood work was significant for a BUN of 41 and a creatinine of 2.17,  indicating renal insufficiency in the context of atherosclerosis,  hypertension, and diabetes.  He is going to follow that with his other  doctors.   MEDICATIONS:  His list was reviewed and updated.  Note, that he has  albuterol 4 mg tabs taken q.6 h. p.r.n. on a p.r.n. basis and rarely  used.  I discussed trying to avoid these.  Advair was discontinued.   PHYSICAL EXAMINATION:  VITAL SIGNS:  Weight 237 pounds, BP 132/68, pulse  regular 66, room air saturation 96%, breath sounds were diminished, dry,  somewhat congested sounding cough, with no dullness.  GENERAL:  He is obese with abdomen crowding the diaphragm.  HEART:  Sounds were regular without murmur.  There was no edema.   Pulmonary function tests done in November at [facility], had  shown mild  obstructive disease with an FEV1 77% of predicted, normal  lung volumes and diffusion capacity and insignificant response to  bronchodilator.   IMPRESSION:  Relatively mild chronic obstructive pulmonary disease with  residual bronchitis component.   PLAN:  1. Advair is discontinued.  Spiriva is substituted at one inhalation      daily.  2. Consider updating PFT in the future.  3. He is encouraged to walk for weight loss.  4. Try Mucinex.  5. Schedule return in four months, earlier p.r.n.     Clinton D. Maple Hudson, MD, Tonny Bollman, FACP  Electronically Signed    CDY/MedQ  DD: 07/29/2006  DT: 07/30/2006  Job #: [id number]   cc:   Madaline Savage, M.D.  [name], M.D.

## 2010-11-18 ENCOUNTER — Emergency Department (HOSPITAL_COMMUNITY)
Admission: EM | Admit: 2010-11-18 | Discharge: 2010-11-18 | Discharge status: Against Medical Advice | Payer: BC Managed Care – PPO | Attending: Emergency Medicine | Admitting: Emergency Medicine

## 2010-11-18 DIAGNOSIS — Z0389 Encounter for observation for other suspected diseases and conditions ruled out: Secondary | ICD-10-CM | POA: Insufficient documentation

## 2010-11-20 ENCOUNTER — Inpatient Hospital Stay (HOSPITAL_COMMUNITY)
Admission: EM | Admit: 2010-11-20 | Discharge: 2010-11-27 | DRG: 544 | Disposition: A | Discharge status: Home Health | Payer: BC Managed Care – PPO | Attending: Internal Medicine | Admitting: Internal Medicine

## 2010-11-20 ENCOUNTER — Emergency Department (HOSPITAL_COMMUNITY): Payer: BC Managed Care – PPO

## 2010-11-20 DIAGNOSIS — Z7982 Long term (current) use of aspirin: Secondary | ICD-10-CM

## 2010-11-20 DIAGNOSIS — Z882 Allergy status to sulfonamides status: Secondary | ICD-10-CM

## 2010-11-20 DIAGNOSIS — Z87891 Personal history of nicotine dependence: Secondary | ICD-10-CM

## 2010-11-20 DIAGNOSIS — N179 Acute kidney failure, unspecified: Secondary | ICD-10-CM | POA: Diagnosis present

## 2010-11-20 DIAGNOSIS — G4733 Obstructive sleep apnea (adult) (pediatric): Secondary | ICD-10-CM | POA: Diagnosis present

## 2010-11-20 DIAGNOSIS — I495 Sick sinus syndrome: Secondary | ICD-10-CM | POA: Diagnosis present

## 2010-11-20 DIAGNOSIS — D649 Anemia, unspecified: Secondary | ICD-10-CM | POA: Diagnosis present

## 2010-11-20 DIAGNOSIS — J449 Chronic obstructive pulmonary disease, unspecified: Secondary | ICD-10-CM | POA: Diagnosis present

## 2010-11-20 DIAGNOSIS — Z888 Allergy status to other drugs, medicaments and biological substances status: Secondary | ICD-10-CM

## 2010-11-20 DIAGNOSIS — E669 Obesity, unspecified: Secondary | ICD-10-CM | POA: Diagnosis present

## 2010-11-20 DIAGNOSIS — IMO0001 Reserved for inherently not codable concepts without codable children: Secondary | ICD-10-CM | POA: Diagnosis present

## 2010-11-20 DIAGNOSIS — M1A00X1 Idiopathic chronic gout, unspecified site, with tophus (tophi): Secondary | ICD-10-CM | POA: Diagnosis present

## 2010-11-20 DIAGNOSIS — Z794 Long term (current) use of insulin: Secondary | ICD-10-CM

## 2010-11-20 DIAGNOSIS — J4489 Other specified chronic obstructive pulmonary disease: Secondary | ICD-10-CM | POA: Diagnosis present

## 2010-11-20 DIAGNOSIS — E785 Hyperlipidemia, unspecified: Secondary | ICD-10-CM | POA: Diagnosis present

## 2010-11-20 DIAGNOSIS — N183 Chronic kidney disease, stage 3 unspecified: Secondary | ICD-10-CM | POA: Diagnosis present

## 2010-11-20 DIAGNOSIS — IMO0002 Reserved for concepts with insufficient information to code with codable children: Secondary | ICD-10-CM

## 2010-11-20 DIAGNOSIS — I2510 Atherosclerotic heart disease of native coronary artery without angina pectoris: Secondary | ICD-10-CM | POA: Diagnosis present

## 2010-11-20 DIAGNOSIS — E875 Hyperkalemia: Secondary | ICD-10-CM | POA: Diagnosis not present

## 2010-11-20 DIAGNOSIS — I509 Heart failure, unspecified: Secondary | ICD-10-CM | POA: Diagnosis present

## 2010-11-20 DIAGNOSIS — I5033 Acute on chronic diastolic (congestive) heart failure: Principal | ICD-10-CM | POA: Diagnosis present

## 2010-11-20 DIAGNOSIS — I129 Hypertensive chronic kidney disease with stage 1 through stage 4 chronic kidney disease, or unspecified chronic kidney disease: Secondary | ICD-10-CM | POA: Diagnosis present

## 2010-11-20 DIAGNOSIS — Z951 Presence of aortocoronary bypass graft: Secondary | ICD-10-CM

## 2010-11-20 LAB — COMPREHENSIVE METABOLIC PANEL
ALT: 29 U/L (ref 0–53)
Alkaline Phosphatase: 92 U/L (ref 39–117)
BUN: 55 mg/dL — ABNORMAL HIGH (ref 6–23)
Chloride: 99 mEq/L (ref 96–112)
GFR calc Af Amer: 43 mL/min — ABNORMAL LOW (ref >60)
Glucose, Bld: 426 mg/dL — ABNORMAL HIGH (ref 70–99)
Potassium: 4.8 mEq/L (ref 3.5–5.1)
Total Bilirubin: 0.2 mg/dL — ABNORMAL LOW (ref 0.3–1.2)

## 2010-11-20 LAB — URINALYSIS, ROUTINE W REFLEX MICROSCOPIC
Bilirubin Urine: NEGATIVE
Glucose, UA: >1000 mg/dL — AB
Ketones, ur: NEGATIVE mg/dL
Leukocytes, UA: NEGATIVE
Nitrite: NEGATIVE
Protein, ur: NEGATIVE mg/dL

## 2010-11-20 LAB — CBC
HCT: 32.7 % — ABNORMAL LOW (ref 39.0–52.0)
Platelets: 179 10*3/uL (ref 150–400)
RDW: 17.8 % — ABNORMAL HIGH (ref 11.5–15.5)
WBC: 6.3 10*3/uL (ref 4.0–10.5)

## 2010-11-20 LAB — D-DIMER, QUANTITATIVE: D-Dimer, Quant: 1.39 ug/mL-FEU — ABNORMAL HIGH (ref 0.00–0.48)

## 2010-11-20 LAB — DIFFERENTIAL
Basophils Absolute: 0 10*3/uL (ref 0.0–0.1)
Eosinophils Absolute: 0.1 10*3/uL (ref 0.0–0.7)
Eosinophils Relative: 1 % (ref 0–5)
Lymphocytes Relative: 25 % (ref 12–46)

## 2010-11-21 ENCOUNTER — Inpatient Hospital Stay (HOSPITAL_COMMUNITY): Payer: BC Managed Care – PPO

## 2010-11-21 LAB — BASIC METABOLIC PANEL
BUN: 47 mg/dL — ABNORMAL HIGH (ref 6–23)
CO2: 25 mEq/L (ref 19–32)
Calcium: 9.6 mg/dL (ref 8.4–10.5)
Chloride: 96 mEq/L (ref 96–112)
Creatinine, Ser: 1.78 mg/dL — ABNORMAL HIGH (ref 0.50–1.35)
Glucose, Bld: 247 mg/dL — ABNORMAL HIGH (ref 70–99)

## 2010-11-21 LAB — LIPID PANEL
HDL: 31 mg/dL — ABNORMAL LOW (ref >39)
LDL Cholesterol: 47 mg/dL (ref 0–99)
Total CHOL/HDL Ratio: 4.8 RATIO
Triglycerides: 356 mg/dL — ABNORMAL HIGH (ref <150)
VLDL: 71 mg/dL — ABNORMAL HIGH (ref 0–40)

## 2010-11-21 LAB — GLUCOSE, CAPILLARY
Glucose-Capillary: 81 mg/dL (ref 70–99)
Glucose-Capillary: 263 mg/dL — ABNORMAL HIGH (ref 70–99)
Glucose-Capillary: 382 mg/dL — ABNORMAL HIGH (ref 70–99)

## 2010-11-21 LAB — MRSA PCR SCREENING: MRSA by PCR: POSITIVE — AB

## 2010-11-21 LAB — CARDIAC PANEL(CRET KIN+CKTOT+MB+TROPI)
CK, MB: 4 ng/mL (ref 0.3–4.0)
Relative Index: 3 — ABNORMAL HIGH (ref 0.0–2.5)
Troponin I: <0.3 ng/mL (ref <0.30)

## 2010-11-21 MED ORDER — XENON XE 133 GAS
10.0000 | GAS_FOR_INHALATION | Freq: Once | RESPIRATORY_TRACT | Status: AC | PRN
  Administered 2010-11-21: 10 via RESPIRATORY_TRACT

## 2010-11-21 MED ORDER — TECHNETIUM TO 99M ALBUMIN AGGREGATED
6.0000 | Freq: Once | INTRAVENOUS | Status: AC | PRN
  Administered 2010-11-21: 6 via INTRAVENOUS

## 2010-11-22 LAB — GLUCOSE, CAPILLARY: Glucose-Capillary: 222 mg/dL — ABNORMAL HIGH (ref 70–99)

## 2010-11-22 LAB — BASIC METABOLIC PANEL
BUN: 49 mg/dL — ABNORMAL HIGH (ref 6–23)
Chloride: 100 mEq/L (ref 96–112)
GFR calc Af Amer: 44 mL/min — ABNORMAL LOW (ref >60)
GFR calc non Af Amer: 37 mL/min — ABNORMAL LOW (ref >60)
Potassium: 4.3 mEq/L (ref 3.5–5.1)
Sodium: 136 mEq/L (ref 135–145)

## 2010-11-22 NOTE — H&P (Signed)
[name], MEHRER NO.:  [id number]  MEDICAL RECORD NO.:  [id number]  LOCATION:                                 FACILITY:  PHYSICIAN:  Houston Siren, MD           DATE OF BIRTH:  09-27-1933  DATE OF ADMISSION: DATE OF DISCHARGE:                             HISTORY & PHYSICAL   PRIMARY CARE PHYSICIAN:  [name], MD  CARDIOLOGIST:  [facility], Nanetta Batty, MD  PULMONOLOGIST:  Rennis Chris. Maple Hudson, MD, Tonny Bollman, FACP  NEPHROLOGIST:  Wilber Bihari. Caryn Section, MD  ADVANCE DIRECTIVE:  Full code.  REASON FOR ADMISSION:  Progressive shortness of breath and leg swelling.  HISTORY OF PRESENT ILLNESS:  This is a 77 year old male with multiple medical problems including known coronary artery disease status post stent placement, obesity, sleep apnea, COPD with significant prior tobacco use, chronic kidney disease, prior acute renal failure, paroxysmal atrial fibrillation, not on chronic anticoagulation, gout, sick sinus syndrome, presents with dyspnea on exertion, weakness, lightheadedness, and progressive leg swellings for the past 1-2 weeks. He had no pleuritic chest pain and had some nonproductive coughs but no fever or chills.  He has no diaphoresis, substernal chest pain, nausea, or vomiting.  Workup included a normal white count with a hemoglobin of 10.3.  His D-dimer is elevated at 1.4.  His cardiac markers were negative and BNP of 481.  His chest x-ray shows cardiomegaly and emphysema, but no acute disease.  He has a negative urinalysis.  His BUN and creatinine were elevated at 55 and 1.9.  His EKG shows sinus rhythm with nonspecific T-wave inversions.  Hospitalist was asked to admit him because of congestive heart failure.  PAST MEDICAL HISTORY: 1. Coronary artery disease status post prior bypass. 2. Systolic congestive heart failure. 3. Hypertension. 4. COPD. 5. Obstructive sleep apnea. 6. Hyperlipidemia. 7. CKD, stage III. 8. Diabetes,  type 2. 9. History of heparin-induced thrombocytopenia. 10.History of paroxysmal atrial fibrillation. 11.History of gout. 12.Sick sinus syndrome. 13.History of spinal stenosis. 14.Anemia. 15.Dementia. 16.History of depression.  SOCIAL HISTORY:  He lives with his wife.  He has several children.  He was a heavy smoker but had quit.  ALLERGIES:  SULFA drugs, HEPARIN, INSULIN, MORPHINE, HYDROMORPHONE, METOPROLOL, ARICEPT, HYDRALAZINE, PLAVIX, and CEFUROXIME.  MEDICATIONS:  Lasix, Imdur, lisinopril, Norvasc, colchicine, glipizide, gabapentin, isosorbide, Lantus, Lexapro, Lipitor, lisinopril, niacin, Dulera, aspirin, and albuterol.  REVIEW OF SYSTEMS:  Otherwise unremarkable.  PHYSICAL EXAMINATION:  VITAL SIGNS:  Blood pressure 150/60, pulse of 80, respiratory rate of 19, temperature 98.4. GENERAL:  He is morbidly obese, does not look in any respiratory distress.  No stridor. HEENT:  Sclerae are nonicteric. NECK:  Supple. CARDIAC:  S1 and S2, regular. LUNGS:  Decreased breath sounds throughout, a little bit of crackles at the base, minimal wheezes. ABDOMEN:  Obese, nondistended, nontender. EXTREMITIES:  Edema 3+ bilaterally.  No calf tenderness. SKIN:  Warm and dry. NEUROLOGIC:  Nonfocal. PSYCHIATRIC:  Unremarkable as well.  OBJECTIVE FINDINGS:  D-dimer 1.4.  BNP 481. Chest x-ray is clear except for cardiomegaly and emphysema. Creatinine 1.9.  Normal white count, hemoglobin at 10.3. EKG showed nonspecific T-wave inversion.  IMPRESSION:  This is a 77 year old male with multiple medical problems presents with dyspnea on exertion, increased pedal edema with chronic kidney disease III, known coronary artery disease, and sleep apnea.  His symptoms are likely multifactorial, but we must exclude ischemic cardiomyopathy, worsening chronic obstructive pulmonary disease, pulmonary embolism (because he likely has pulmonary hypertension from sleep apnea which increased his risk for  pulmonary embolism).  We will not give Lovenox because of prior heparin-induced thrombocytopenia and my clinical suspicion for pulmonary embolism is low, but we will get a ventilation/perfusion scan.  We will rule out with serial CPKs and troponins.  We will get an echo.  I will continue his medications as soon as they are reconciled.  I will continue continuous positive airway pressure with nebulizer treatments.  He will benefit getting diuresed and this will be given with Lasix at 40 mg IV q.12 h.  He is stable and is a full code. We will admit him to telemetry under [place] [facility].    Houston Siren, MD    PL/MEDQ  D:  11/20/2010  T:  11/20/2010  Job:  [id number]  Electronically Signed by Houston Siren  on 11/22/2010 04:27:50 AM

## 2010-11-23 LAB — BASIC METABOLIC PANEL
CO2: 25 mEq/L (ref 19–32)
CO2: 26 mEq/L (ref 19–32)
Calcium: 8.8 mg/dL (ref 8.4–10.5)
Chloride: 99 mEq/L (ref 96–112)
Creatinine, Ser: 2.08 mg/dL — ABNORMAL HIGH (ref 0.50–1.35)
Creatinine, Ser: 2.05 mg/dL — ABNORMAL HIGH (ref 0.50–1.35)
GFR calc Af Amer: 39 mL/min — ABNORMAL LOW (ref >60)
GFR calc non Af Amer: 32 mL/min — ABNORMAL LOW (ref >60)
Potassium: 5.4 mEq/L — ABNORMAL HIGH (ref 3.5–5.1)
Sodium: 131 mEq/L — ABNORMAL LOW (ref 135–145)

## 2010-11-23 LAB — GLUCOSE, CAPILLARY
Glucose-Capillary: 349 mg/dL — ABNORMAL HIGH (ref 70–99)
Glucose-Capillary: 354 mg/dL — ABNORMAL HIGH (ref 70–99)
Glucose-Capillary: 520 mg/dL — ABNORMAL HIGH (ref 70–99)
Glucose-Capillary: 486 mg/dL — ABNORMAL HIGH (ref 70–99)

## 2010-11-23 LAB — PRO B NATRIURETIC PEPTIDE: Pro B Natriuretic peptide (BNP): 411.9 pg/mL (ref 0–450)

## 2010-11-24 LAB — GLUCOSE, CAPILLARY: Glucose-Capillary: 336 mg/dL — ABNORMAL HIGH (ref 70–99)

## 2010-11-24 LAB — BASIC METABOLIC PANEL
BUN: 55 mg/dL — ABNORMAL HIGH (ref 6–23)
Calcium: 9.5 mg/dL (ref 8.4–10.5)
Creatinine, Ser: 1.89 mg/dL — ABNORMAL HIGH (ref 0.50–1.35)
GFR calc Af Amer: 42 mL/min — ABNORMAL LOW (ref >60)
GFR calc non Af Amer: 35 mL/min — ABNORMAL LOW (ref >60)

## 2010-11-25 LAB — GLUCOSE, CAPILLARY
Glucose-Capillary: 134 mg/dL — ABNORMAL HIGH (ref 70–99)
Glucose-Capillary: 171 mg/dL — ABNORMAL HIGH (ref 70–99)
Glucose-Capillary: 240 mg/dL — ABNORMAL HIGH (ref 70–99)

## 2010-11-25 LAB — BASIC METABOLIC PANEL
CO2: 29 mEq/L (ref 19–32)
Glucose, Bld: 131 mg/dL — ABNORMAL HIGH (ref 70–99)
Potassium: 4.3 mEq/L (ref 3.5–5.1)
Sodium: 138 mEq/L (ref 135–145)

## 2010-11-26 LAB — MAGNESIUM: Magnesium: 2 mg/dL (ref 1.5–2.5)

## 2010-11-26 LAB — GLUCOSE, CAPILLARY: Glucose-Capillary: 244 mg/dL — ABNORMAL HIGH (ref 70–99)

## 2010-11-26 LAB — BASIC METABOLIC PANEL
BUN: 55 mg/dL — ABNORMAL HIGH (ref 6–23)
CO2: 29 mEq/L (ref 19–32)
Calcium: 9.1 mg/dL (ref 8.4–10.5)
Chloride: 97 mEq/L (ref 96–112)
Creatinine, Ser: 2.19 mg/dL — ABNORMAL HIGH (ref 0.50–1.35)
GFR calc Af Amer: 36 mL/min — ABNORMAL LOW (ref >60)

## 2010-11-27 LAB — GLUCOSE, CAPILLARY: Glucose-Capillary: 259 mg/dL — ABNORMAL HIGH (ref 70–99)

## 2010-11-28 NOTE — Discharge Summary (Signed)
[name], MADARIAGA              ACCOUNT NO.:  [id number]  MEDICAL RECORD NO.:  [id number]  LOCATION:  4711                         FACILITY:  [facility]  PHYSICIAN:  Marinda Elk, M.D.DATE OF BIRTH:  [birth year]  DATE OF ADMISSION:  11/20/2010 DATE OF DISCHARGE:                              DISCHARGE SUMMARY   PRIMARY CARE DOCTOR:  [name], MD  CARDIOLOGIST:  [facility].  PULMONOLOGIST:  Rennis Chris. Maple Hudson, MD, Tonny Bollman, FACP  NEPHROLOGIST:  Wilber Bihari. Caryn Section, MD  DISCHARGE DIAGNOSES: 1. Acute on chronic diastolic heart failure. 2. Chronic kidney disease, stage III. 3. Diabetes, type 2 with a hemoglobin A1c of 11. 4. Hyperlipidemia. 5. Gout. 6. Obstructive sleep apnea.  Continue continuous positive airway     pressure at home. 7. History of heparin-induced thrombocytopenia. 8. Chronic obstructive pulmonary disease. 9. Anemia. 10.Hypertension.  DISCHARGE MEDICATIONS: 1. Bumex 2 mg b.i.d. 2. Lantus 60 units b.i.d. 3. Prednisone 10 mg daily for 3 days and 5 mg daily. 4. Lisinopril 20 mg b.i.d. 5. Albuterol inhaled 4 times a daily. 6. Allopurinol 100 mg b.i.d. 7. Aspirin 325 mg every morning. 8. Sinemet 1000 mg every morning. 9. Colchicine 0.6 mg daily. 10.Folic acid 400 mg daily. 11.Gabapentin 100 mg at bedtime. 12.Imdur 30 mg once every morning. 13.Lexapro 10 mg daily. 14.Lipitor 20 mg daily. 15.Multivitamin 1 tab daily. 16.Protonix 40 mg every morning. 17.Tylenol 650 mg b.i.d. 18.Zetia 10 mg b.i.d.  CONSULTANT:  [facility].  PROCEDURES PERFORMED: 1. V/Q scan that showed very low probability for acute PE. 2. Chest x-ray that showed cardiomegaly and hyperinflation.  BRIEF HOSPITAL COURSE: 1. Acute on chronic diastolic heart failure.  The patient was diuresed     with Lasix, however, his renal function went up to 2 according to     the patient' wife.  His baseline is 1.8 to 1.6.  The diuretics will     held for 1 day and then restart at a  lower dose.  Cardiology was     consulted.  They changed him to Bumex and discontinued Lasix.     Shortness of breath improved.  He was ambulating without any     oxygen.  The patient had been initially on 5 of Norvasc and this     increased to 10, then this discontinued and lisinopril was titrated     up.  Then, we will continue to go up on Imdur as tolerated. 2. Diabetes, type 2, currently poorly controlled, hemoglobin A1c of     11.  His Lantus was titrated up to 60 units b.i.d.  His prednisone     was increased up, so his blood glucose is going to high for a     couple of days.  This was discussed with the patient.  He should     follow up with his primary care closely to titrate his diabetes     medications as tolerated. 3. Chronic kidney disease stage III.  Per Dr. Scherrie Gerlach record, his     creatinine baseline is 1.8 and at the hospital before discharge, it     was 2.1.  We will continue to monitor this closely by his  cardiologist and nephrologist as an outpatient.  I will discuss     with the patient that he is on high-dose of diuretics. If he is     orthostatic or dizzy upon standing, he should stop taking it and     call his doctor. 4. 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  Prednisone 5, allopurinol, and     colchicine.  The prednisone was increased to 10 due to his severe     pain that he was having in the hospital.  By the next day, his pain     has subsided.  He will continue on prednisone 10 mg for 3 more     days, then titrate back down to 5.  VITALS ON THE DAY OF DISCHARGE:  Temperature 97, pulse 69, respirations 19, and blood pressure 164/76.  He was sating 93% on room air.  LABORATORIES ON DAY OF DISCHARGE:  Sodium 136, potassium 4.6, chloride 97, bicarb of 29, glucose of 134, BUN of 55, creatinine 2.1, and calcium of 9.1.     Marinda Elk, M.D.     AF/MEDQ  D:  11/27/2010  T:  11/27/2010  Job:  [id number]  cc:   [name], M.D. Richard F. Caryn Section,  M.D. [facility]  Electronically Signed by Marinda Elk M.D. on 11/28/2010 02:28:15 PM

## 2010-12-23 NOTE — Discharge Summary (Signed)
  [name], [name]              ACCOUNT NO.:  [id number]  MEDICAL RECORD NO.:  [id number]  LOCATION:                                 FACILITY:  PHYSICIAN:  Marinda Elk, M.D.DATE OF BIRTH:  1933/06/14  DATE OF ADMISSION: DATE OF DISCHARGE:  11/27/2010                              DISCHARGE SUMMARY   ADDENDUM  MEDICATIONS: 1. She is on amlodipine 10 mg p.o. every morning. 2. Cinnamon 1000 mg tabs every morning.  This substitute #8 in the     previous discharge summary which is Sinemet every morning.     Marinda Elk, M.D.     AF/MEDQ  D:  12/12/2010  T:  12/13/2010  Job:  [id number]  Electronically Signed by Marinda Elk M.D. on 12/23/2010 07:01:56 AM

## 2011-01-06 NOTE — Progress Notes (Signed)
[name], [name]              ACCOUNT NO.:  [id number]  MEDICAL RECORD NO.:  [id number]  LOCATION:  4711                         FACILITY:  [facility]  PHYSICIAN:  Altha Harm, MDDATE OF BIRTH:  October 24, 1933                                PROGRESS NOTE   DISCHARGE DISPOSITION: Home.  FINAL DISCHARGE DIAGNOSES: 1. Acute on chronic diastolic heart failure, two components resolved. 2. Chronic kidney disease, stage III. 3. Diabetes type 2. 4. Hyperlipidemia. 5. Tophaceous gout. 6. Obstructive sleep apnea, on continuous positive airway pressure. 7. History of heparin-induced thrombocytopenia. 8. Chronic obstructive pulmonary disease, stable. 9. Anemia. 10.Abnormal lipids. 11.Hypertension. 12.Sick sinus syndrome.  DISCHARGE MEDICATIONS: 1. Lantus 60 units subcu at bedtime. 2. Albuterol nebulized 2.5 mg inhaled four times a day. 3. Allopurinol 100 mg p.o. b.i.d. 4. Amlodipine 10 mg p.o. daily. 5. Aspirin 325 mg p.o. daily. 6. Colchicine 0.6 mg p.o. daily. 7. Folic acid 400 mg p.o. daily. 8. Gabapentin 100 mg p.o. nightly. 9. Isosorbide mononitrate XR 30 mg p.o. daily. 10.Lexapro 10 mg p.o. daily. 11.Lipitor 20 mg p.o. nightly. 12.Lisinopril 0.5 mg in the a.m. and one tablet in the p.m. 13.Multivitamin one tablet in a.m. and half tablet in the p.m. 14.Prednisone 5 mg p.o. daily. 15.Protonix 40 mg p.o. daily. 16.Tylenol Arthritis 650 mg p.o. b.i.d. 17.Zetia 10 mg p.o. daily. 18.Cinnamon 1000 mg p.o. daily.  CONSULTANT: [facility].  PROCEDURES: None.  DIAGNOSTIC STUDIES: 1. Two-view chest x-ray, which shows cardiomegaly and hyperinflation,     no active disease. 2. V/Q scan done on November 21, 2010, which shows very low probability of     acute pulmonary embolus. 3. A 2-D echocardiogram done on November 21, 2010, which shows LV function     normal at 65-70% with severe LVH and concentric hypertrophy,     parameters consistent with grade  2 diastolic dysfunction.  PRIMARY CARE PHYSICIAN: [name], MD  PRIMARY CARDIOLOGIST: Nanetta Batty, MD  PRIMARY PULMONOLOGIST: Rennis Chris. Maple Hudson, MD, Tonny Bollman, FACP  PRIMARY NEPHROLOGIST: Wilber Bihari. Caryn Section, MD  CODE STATUS: Full code.  ALLERGIES: Multiple allergies including SULFA, HEPARIN, INVANZ, MORPHINE, HYDROMORPHONE, METOPROLOL, ARICEPT,  HYDRALAZINE, PLAVIX, CEFUROXIME.  CHIEF COMPLAINT: Progressive shortness of breath and leg swelling.  HISTORY OF PRESENT ILLNESS: Please refer to the H and P by Dr. Conley Rolls for details of the HPI.  However, in short, this is a 77 year old man with multiple medical problems as noted above.  The patient has been having problems with gout and had his diuretics decreased as well as his Norvasc to increase.  The patient noticed that he was having swelling, which has progressed and he came to the hospital for further evaluation and management.  HOSPITAL COURSE: 1. Acute on chronic diastolic heart failure.  The patient was     diuresing well with IV Lasix.  However, his renal function  increased up to 2.  According to the patient's wife, his baseline     function was 1.6.  Thus, the diuretics were held for 1 day and then     reinstated a lower dose.  The patient did not exhibit any     difficulty with oxygenation during this time  and has been     ambulating without any need for oxygen.  [facility] was consulted and they saw him in consultation.  Knowing     this gentleman from before, Dr. Rennis Golden felt that the patient should     be diuresed further and switched him over to Bumex 3 mg daily.     Presently, they are still titrating his medications and final     medications in terms of diuretics would be determined at the time     of discharge.  The patient had been initially on 5 mg of Norvasc     and was increased to 10 mg of Norvasc.  I am somewhat skeptical     about this as Norvasc is known to cause fluid retention and  the     patient seems to have a propensity for our retaining fluid causing     him to have respiratory embarrassment. 2. Diabetes type 2.  The patient has uncontrolled diabetes type 2 with     a hemoglobin A1c approximately 11.  The patient had episodes in the     hospital where his blood sugars were elevated.  However, he has     been taking Ricola cough drops, which was not sugar free.  This was     brought to the patient's attention.  When he stopped taken them,     his blood sugars came down nicely.  The patient is on 60 units of     Lantus and should remain on that. 3. Chronic kidney disease, stage III.  The patient has chronic kidney     disease, stage III.  Per records from Dr. Scherrie Gerlach office, the     patient's baseline creatinine is approximately at 1.8.  The     patient's creatinine on today is 1.8 and I would follow the     creatinine and continue diuresis.  It may mean that the patient has     to have a higher creatinine in order to allow for uncompromised     respiratory status and that will be determined at the time of     discharge. 4. Tophaceous gout.  The patient does have tophaceous gout and is on     chronic prednisone 5 mg.  Additionally, he is on allopurinol and     colchicine and should continue those at home.  Otherwise, the     patient has been stable on the hospital.  All of his chronic     medical problems have remained stable.  PHYSICAL EXAMINATION: VITAL SIGNS:  At the time of this dictation, the patient is stable.  His vital signs are as follows; temperature is 98, heart rate 84, respiratory rate 16, blood pressure is 154/70, O2 sats are 97% on room air and 95% ambulatory on room air. GENERAL:  The patient is well appearing. HEENT:  Normocephalic and atraumatic.  Pupils are equally round and reactive to light and accommodation.  Extraocular movements are intact. Oropharynx is moist.  No exudate, erythema or lesions are noted. NECK:  Trachea is midline.  No  masses, no thyromegaly, no JVD, no carotid bruit. RESPIRATORY:  The patient has a normal respiratory effort, equal excursion bilaterally.  No wheezing or rhonchi noted. CARDIOVASCULAR:  He has got normal a S1 and S2.  No murmurs, rubs or gallops are noted.  PMI is nondisplaced.  No heaves or thrills on palpation. ABDOMEN:  Obese, soft, no masses,  no hepatosplenomegaly, no rebound, no guarding. EXTREMITIES:  Show no clubbing, cyanosis, or edema.  DIETARY RESTRICTIONS: The patient should be on a diabetic, heart healthy, low-salt diet.  PHYSICAL RESTRICTIONS: None.  FOLLOWUP: The patient should follow up with his primary care physician, Dr. Jeannetta Nap within 1 week and follow up with [facility] as directed by them.  In terms of follow up to his pulmonologist and Renal doctor, he should follow up as he originally scheduled.     Altha Harm, MD     MAM/MEDQ  D:  11/25/2010  T:  11/25/2010  Job:  [id number]  cc:   [name], M.D. Fax: [phone]  Nanetta Batty, M.D. Fax: [phone]  Clinton D. Maple Hudson, MD, FCCP, FACP [facility] [address] [place] [address]  Wilber Bihari. Caryn Section, M.D. Fax: [phone]  Electronically Signed by Marthann Schiller MD on 01/06/2011 08:44:47 PM

## 2011-02-17 LAB — DIFFERENTIAL
Basophils Absolute: 0
Basophils Relative: 0
Neutro Abs: 5.9
Neutrophils Relative %: 55

## 2011-02-17 LAB — COMPREHENSIVE METABOLIC PANEL
ALT: 48
Albumin: 3.6
Alkaline Phosphatase: 67
BUN: 35 — ABNORMAL HIGH
Calcium: 8.7
Potassium: 4
Sodium: 138
Total Protein: 7.2

## 2011-02-17 LAB — CBC
HCT: 28.7 — ABNORMAL LOW
Hemoglobin: 10 — ABNORMAL LOW
MCHC: 34.5
MCHC: 34.6
MCHC: 35
MCV: 93.2
Platelets: 178
Platelets: 183
RBC: 3.08 — ABNORMAL LOW
RBC: 3.82 — ABNORMAL LOW
RDW: 14.3
RDW: 14.5
WBC: 7.4

## 2011-02-17 LAB — POCT CARDIAC MARKERS
CKMB, poc: 1.2
Myoglobin, poc: 254
Operator id: 294521

## 2011-02-17 LAB — INFLUENZA A+B VIRUS AG-DIRECT(RAPID)
Inflenza A Ag: NEGATIVE
Influenza B Ag: NEGATIVE

## 2011-02-17 LAB — BASIC METABOLIC PANEL
CO2: 26
Chloride: 98
GFR calc Af Amer: 31 — ABNORMAL LOW
Potassium: 3.8
Sodium: 134 — ABNORMAL LOW

## 2011-02-17 LAB — POCT I-STAT, CHEM 8
Calcium, Ion: 1.1 — ABNORMAL LOW
Glucose, Bld: 157 — ABNORMAL HIGH
HCT: 37 — ABNORMAL LOW
Hemoglobin: 12.6 — ABNORMAL LOW

## 2011-02-17 LAB — LIPID PANEL
Triglycerides: 219 — ABNORMAL HIGH
VLDL: 44 — ABNORMAL HIGH

## 2011-02-17 LAB — HEMOGLOBIN A1C: Mean Plasma Glucose: 200

## 2011-02-17 LAB — CULTURE, BLOOD (ROUTINE X 2)

## 2011-02-17 LAB — PHOSPHORUS: Phosphorus: 3.6

## 2011-02-17 LAB — TSH: TSH: 1.735

## 2011-02-17 LAB — CALCIUM: Calcium: 8.3 — ABNORMAL LOW

## 2011-02-27 LAB — BASIC METABOLIC PANEL
BUN: 29 mg/dL — ABNORMAL HIGH (ref 6–23)
BUN: 35 mg/dL — ABNORMAL HIGH (ref 6–23)
BUN: 38 mg/dL — ABNORMAL HIGH (ref 6–23)
BUN: 55 mg/dL — ABNORMAL HIGH (ref 6–23)
BUN: 56 mg/dL — ABNORMAL HIGH (ref 6–23)
BUN: 79 mg/dL — ABNORMAL HIGH (ref 6–23)
CO2: 20 mEq/L (ref 19–32)
CO2: 23 mEq/L (ref 19–32)
CO2: 25 mEq/L (ref 19–32)
CO2: 27 mEq/L (ref 19–32)
CO2: 28 mEq/L (ref 19–32)
CO2: 29 mEq/L (ref 19–32)
Calcium: 8.1 mg/dL — ABNORMAL LOW (ref 8.4–10.5)
Calcium: 8.3 mg/dL — ABNORMAL LOW (ref 8.4–10.5)
Calcium: 8.4 mg/dL (ref 8.4–10.5)
Calcium: 8.7 mg/dL (ref 8.4–10.5)
Calcium: 8.9 mg/dL (ref 8.4–10.5)
Chloride: 100 mEq/L (ref 96–112)
Chloride: 100 mEq/L (ref 96–112)
Chloride: 100 mEq/L (ref 96–112)
Chloride: 101 mEq/L (ref 96–112)
Chloride: 105 mEq/L (ref 96–112)
Chloride: 106 mEq/L (ref 96–112)
Chloride: 98 mEq/L (ref 96–112)
Chloride: 98 mEq/L (ref 96–112)
Creatinine, Ser: 2.28 mg/dL — ABNORMAL HIGH (ref 0.4–1.5)
Creatinine, Ser: 2.38 mg/dL — ABNORMAL HIGH (ref 0.4–1.5)
Creatinine, Ser: 2.45 mg/dL — ABNORMAL HIGH (ref 0.4–1.5)
Creatinine, Ser: 3.61 mg/dL — ABNORMAL HIGH (ref 0.4–1.5)
Creatinine, Ser: 4.08 mg/dL — ABNORMAL HIGH (ref 0.4–1.5)
Creatinine, Ser: 5.44 mg/dL — ABNORMAL HIGH (ref 0.4–1.5)
Creatinine, Ser: 5.46 mg/dL — ABNORMAL HIGH (ref 0.4–1.5)
GFR calc Af Amer: 13 mL/min — ABNORMAL LOW (ref >60)
GFR calc Af Amer: 13 mL/min — ABNORMAL LOW (ref >60)
GFR calc Af Amer: 20 mL/min — ABNORMAL LOW (ref >60)
GFR calc Af Amer: 31 mL/min — ABNORMAL LOW (ref >60)
GFR calc Af Amer: 32 mL/min — ABNORMAL LOW (ref >60)
GFR calc Af Amer: 34 mL/min — ABNORMAL LOW (ref >60)
GFR calc Af Amer: 9 mL/min — ABNORMAL LOW (ref >60)
GFR calc non Af Amer: 10 mL/min — ABNORMAL LOW (ref >60)
GFR calc non Af Amer: 17 mL/min — ABNORMAL LOW (ref >60)
Glucose, Bld: 149 mg/dL — ABNORMAL HIGH (ref 70–99)
Glucose, Bld: 171 mg/dL — ABNORMAL HIGH (ref 70–99)
Potassium: 4.2 mEq/L (ref 3.5–5.1)
Potassium: 4.5 mEq/L (ref 3.5–5.1)
Potassium: 4.9 mEq/L (ref 3.5–5.1)
Potassium: 5.3 mEq/L — ABNORMAL HIGH (ref 3.5–5.1)
Sodium: 131 mEq/L — ABNORMAL LOW (ref 135–145)
Sodium: 134 mEq/L — ABNORMAL LOW (ref 135–145)
Sodium: 136 mEq/L (ref 135–145)
Sodium: 137 mEq/L (ref 135–145)

## 2011-02-27 LAB — CBC
HCT: 27.6 % — ABNORMAL LOW (ref 39.0–52.0)
HCT: 28.8 % — ABNORMAL LOW (ref 39.0–52.0)
HCT: 28.9 % — ABNORMAL LOW (ref 39.0–52.0)
HCT: 29.3 % — ABNORMAL LOW (ref 39.0–52.0)
HCT: 29.4 % — ABNORMAL LOW (ref 39.0–52.0)
HCT: 29.4 % — ABNORMAL LOW (ref 39.0–52.0)
HCT: 29.6 % — ABNORMAL LOW (ref 39.0–52.0)
HCT: 29.7 % — ABNORMAL LOW (ref 39.0–52.0)
HCT: 29.8 % — ABNORMAL LOW (ref 39.0–52.0)
HCT: 29.9 % — ABNORMAL LOW (ref 39.0–52.0)
HCT: 31.6 % — ABNORMAL LOW (ref 39.0–52.0)
HCT: 33.1 % — ABNORMAL LOW (ref 39.0–52.0)
HCT: 37.7 % — ABNORMAL LOW (ref 39.0–52.0)
Hemoglobin: 10.1 g/dL — ABNORMAL LOW (ref 13.0–17.0)
Hemoglobin: 10.2 g/dL — ABNORMAL LOW (ref 13.0–17.0)
Hemoglobin: 10.3 g/dL — ABNORMAL LOW (ref 13.0–17.0)
Hemoglobin: 11 g/dL — ABNORMAL LOW (ref 13.0–17.0)
Hemoglobin: 11.9 g/dL — ABNORMAL LOW (ref 13.0–17.0)
Hemoglobin: 9.6 g/dL — ABNORMAL LOW (ref 13.0–17.0)
Hemoglobin: 9.8 g/dL — ABNORMAL LOW (ref 13.0–17.0)
Hemoglobin: 9.8 g/dL — ABNORMAL LOW (ref 13.0–17.0)
Hemoglobin: 9.9 g/dL — ABNORMAL LOW (ref 13.0–17.0)
Hemoglobin: 9.9 g/dL — ABNORMAL LOW (ref 13.0–17.0)
MCHC: 32.6 g/dL (ref 30.0–36.0)
MCHC: 33 g/dL (ref 30.0–36.0)
MCHC: 33.1 g/dL (ref 30.0–36.0)
MCHC: 33.2 g/dL (ref 30.0–36.0)
MCHC: 33.2 g/dL (ref 30.0–36.0)
MCHC: 33.3 g/dL (ref 30.0–36.0)
MCHC: 33.3 g/dL (ref 30.0–36.0)
MCHC: 33.3 g/dL (ref 30.0–36.0)
MCHC: 33.3 g/dL (ref 30.0–36.0)
MCHC: 33.4 g/dL (ref 30.0–36.0)
MCHC: 33.6 g/dL (ref 30.0–36.0)
MCHC: 33.8 g/dL (ref 30.0–36.0)
MCHC: 34 g/dL (ref 30.0–36.0)
MCHC: 34.3 g/dL (ref 30.0–36.0)
MCHC: 34.4 g/dL (ref 30.0–36.0)
MCHC: 34.6 g/dL (ref 30.0–36.0)
MCV: 88 fL (ref 78.0–100.0)
MCV: 88.3 fL (ref 78.0–100.0)
MCV: 89 fL (ref 78.0–100.0)
MCV: 89.4 fL (ref 78.0–100.0)
MCV: 89.6 fL (ref 78.0–100.0)
MCV: 89.7 fL (ref 78.0–100.0)
MCV: 89.7 fL (ref 78.0–100.0)
MCV: 90 fL (ref 78.0–100.0)
MCV: 90.8 fL (ref 78.0–100.0)
MCV: 91.6 fL (ref 78.0–100.0)
MCV: 92 fL (ref 78.0–100.0)
MCV: 92.2 fL (ref 78.0–100.0)
MCV: 92.6 fL (ref 78.0–100.0)
MCV: 92.7 fL (ref 78.0–100.0)
MCV: 92.8 fL (ref 78.0–100.0)
MCV: 93 fL (ref 78.0–100.0)
Platelets: 126 10*3/uL — ABNORMAL LOW (ref 150–400)
Platelets: 148 10*3/uL — ABNORMAL LOW (ref 150–400)
Platelets: 153 10*3/uL (ref 150–400)
Platelets: 159 10*3/uL (ref 150–400)
Platelets: 180 10*3/uL (ref 150–400)
Platelets: 198 10*3/uL (ref 150–400)
Platelets: 204 10*3/uL (ref 150–400)
Platelets: 216 10*3/uL (ref 150–400)
Platelets: 234 10*3/uL (ref 150–400)
Platelets: 280 10*3/uL (ref 150–400)
Platelets: 289 10*3/uL (ref 150–400)
Platelets: 290 10*3/uL (ref 150–400)
Platelets: 299 10*3/uL (ref 150–400)
Platelets: 324 10*3/uL (ref 150–400)
RBC: 2.68 MIL/uL — ABNORMAL LOW (ref 4.22–5.81)
RBC: 3.07 MIL/uL — ABNORMAL LOW (ref 4.22–5.81)
RBC: 3.21 MIL/uL — ABNORMAL LOW (ref 4.22–5.81)
RBC: 3.22 MIL/uL — ABNORMAL LOW (ref 4.22–5.81)
RBC: 3.26 MIL/uL — ABNORMAL LOW (ref 4.22–5.81)
RBC: 3.28 MIL/uL — ABNORMAL LOW (ref 4.22–5.81)
RBC: 3.31 MIL/uL — ABNORMAL LOW (ref 4.22–5.81)
RBC: 3.37 MIL/uL — ABNORMAL LOW (ref 4.22–5.81)
RBC: 3.49 MIL/uL — ABNORMAL LOW (ref 4.22–5.81)
RBC: 3.69 MIL/uL — ABNORMAL LOW (ref 4.22–5.81)
RBC: 3.79 MIL/uL — ABNORMAL LOW (ref 4.22–5.81)
RDW: 16.3 % — ABNORMAL HIGH (ref 11.5–15.5)
RDW: 16.4 % — ABNORMAL HIGH (ref 11.5–15.5)
RDW: 16.7 % — ABNORMAL HIGH (ref 11.5–15.5)
RDW: 16.8 % — ABNORMAL HIGH (ref 11.5–15.5)
RDW: 16.9 % — ABNORMAL HIGH (ref 11.5–15.5)
RDW: 17 % — ABNORMAL HIGH (ref 11.5–15.5)
RDW: 17.8 % — ABNORMAL HIGH (ref 11.5–15.5)
WBC: 11.9 10*3/uL — ABNORMAL HIGH (ref 4.0–10.5)
WBC: 7 10*3/uL (ref 4.0–10.5)
WBC: 7.6 10*3/uL (ref 4.0–10.5)
WBC: 7.7 10*3/uL (ref 4.0–10.5)
WBC: 8.2 10*3/uL (ref 4.0–10.5)
WBC: 8.4 10*3/uL (ref 4.0–10.5)
WBC: 8.4 10*3/uL (ref 4.0–10.5)
WBC: 8.6 10*3/uL (ref 4.0–10.5)
WBC: 8.8 10*3/uL (ref 4.0–10.5)

## 2011-02-27 LAB — COMPREHENSIVE METABOLIC PANEL
ALT: 21 U/L (ref 0–53)
ALT: 21 U/L (ref 0–53)
AST: 29 U/L (ref 0–37)
AST: 36 U/L (ref 0–37)
Albumin: 2.6 g/dL — ABNORMAL LOW (ref 3.5–5.2)
Alkaline Phosphatase: 30 U/L — ABNORMAL LOW (ref 39–117)
Alkaline Phosphatase: 33 U/L — ABNORMAL LOW (ref 39–117)
Alkaline Phosphatase: 42 U/L (ref 39–117)
BUN: 36 mg/dL — ABNORMAL HIGH (ref 6–23)
BUN: 39 mg/dL — ABNORMAL HIGH (ref 6–23)
BUN: 46 mg/dL — ABNORMAL HIGH (ref 6–23)
BUN: 80 mg/dL — ABNORMAL HIGH (ref 6–23)
CO2: 22 mEq/L (ref 19–32)
CO2: 25 mEq/L (ref 19–32)
CO2: 27 mEq/L (ref 19–32)
CO2: 28 mEq/L (ref 19–32)
Chloride: 100 mEq/L (ref 96–112)
Chloride: 101 mEq/L (ref 96–112)
Chloride: 103 mEq/L (ref 96–112)
Chloride: 104 mEq/L (ref 96–112)
Creatinine, Ser: 2.07 mg/dL — ABNORMAL HIGH (ref 0.4–1.5)
Creatinine, Ser: 2.38 mg/dL — ABNORMAL HIGH (ref 0.4–1.5)
Creatinine, Ser: 3.46 mg/dL — ABNORMAL HIGH (ref 0.4–1.5)
GFR calc Af Amer: 33 mL/min — ABNORMAL LOW (ref >60)
GFR calc non Af Amer: 27 mL/min — ABNORMAL LOW (ref >60)
GFR calc non Af Amer: 32 mL/min — ABNORMAL LOW (ref >60)
GFR calc non Af Amer: 8 mL/min — ABNORMAL LOW (ref >60)
Glucose, Bld: 144 mg/dL — ABNORMAL HIGH (ref 70–99)
Glucose, Bld: 186 mg/dL — ABNORMAL HIGH (ref 70–99)
Glucose, Bld: 91 mg/dL (ref 70–99)
Potassium: 4.4 mEq/L (ref 3.5–5.1)
Potassium: 4.7 mEq/L (ref 3.5–5.1)
Potassium: 5.6 mEq/L — ABNORMAL HIGH (ref 3.5–5.1)
Sodium: 134 mEq/L — ABNORMAL LOW (ref 135–145)
Sodium: 136 mEq/L (ref 135–145)
Total Bilirubin: 0.5 mg/dL (ref 0.3–1.2)
Total Bilirubin: 0.7 mg/dL (ref 0.3–1.2)
Total Bilirubin: 0.8 mg/dL (ref 0.3–1.2)
Total Bilirubin: 1 mg/dL (ref 0.3–1.2)
Total Bilirubin: 1.4 mg/dL — ABNORMAL HIGH (ref 0.3–1.2)
Total Protein: 6.3 g/dL (ref 6.0–8.3)
Total Protein: 6.6 g/dL (ref 6.0–8.3)

## 2011-02-27 LAB — GLUCOSE, CAPILLARY
Glucose-Capillary: 104 mg/dL — ABNORMAL HIGH (ref 70–99)
Glucose-Capillary: 115 mg/dL — ABNORMAL HIGH (ref 70–99)
Glucose-Capillary: 129 mg/dL — ABNORMAL HIGH (ref 70–99)
Glucose-Capillary: 134 mg/dL — ABNORMAL HIGH (ref 70–99)
Glucose-Capillary: 136 mg/dL — ABNORMAL HIGH (ref 70–99)
Glucose-Capillary: 138 mg/dL — ABNORMAL HIGH (ref 70–99)
Glucose-Capillary: 142 mg/dL — ABNORMAL HIGH (ref 70–99)
Glucose-Capillary: 150 mg/dL — ABNORMAL HIGH (ref 70–99)
Glucose-Capillary: 151 mg/dL — ABNORMAL HIGH (ref 70–99)
Glucose-Capillary: 152 mg/dL — ABNORMAL HIGH (ref 70–99)
Glucose-Capillary: 154 mg/dL — ABNORMAL HIGH (ref 70–99)
Glucose-Capillary: 170 mg/dL — ABNORMAL HIGH (ref 70–99)
Glucose-Capillary: 179 mg/dL — ABNORMAL HIGH (ref 70–99)
Glucose-Capillary: 183 mg/dL — ABNORMAL HIGH (ref 70–99)
Glucose-Capillary: 184 mg/dL — ABNORMAL HIGH (ref 70–99)
Glucose-Capillary: 188 mg/dL — ABNORMAL HIGH (ref 70–99)
Glucose-Capillary: 193 mg/dL — ABNORMAL HIGH (ref 70–99)
Glucose-Capillary: 197 mg/dL — ABNORMAL HIGH (ref 70–99)
Glucose-Capillary: 215 mg/dL — ABNORMAL HIGH (ref 70–99)
Glucose-Capillary: 258 mg/dL — ABNORMAL HIGH (ref 70–99)
Glucose-Capillary: 49 mg/dL — ABNORMAL LOW (ref 70–99)
Glucose-Capillary: 68 mg/dL — ABNORMAL LOW (ref 70–99)
Glucose-Capillary: 82 mg/dL (ref 70–99)
Glucose-Capillary: 82 mg/dL (ref 70–99)
Glucose-Capillary: 91 mg/dL (ref 70–99)

## 2011-02-27 LAB — DIC (DISSEMINATED INTRAVASCULAR COAGULATION)PANEL
D-Dimer, Quant: 2.16 ug/mL-FEU — ABNORMAL HIGH (ref 0.00–0.48)
Fibrinogen: 635 mg/dL — ABNORMAL HIGH (ref 204–475)
Platelets: 280 10*3/uL (ref 150–400)
Prothrombin Time: 18.1 seconds — ABNORMAL HIGH (ref 11.6–15.2)
aPTT: 37 seconds (ref 24–37)

## 2011-02-27 LAB — RENAL FUNCTION PANEL
BUN: 47 mg/dL — ABNORMAL HIGH (ref 6–23)
CO2: 23 mEq/L (ref 19–32)
CO2: 26 mEq/L (ref 19–32)
Calcium: 8.5 mg/dL (ref 8.4–10.5)
Chloride: 101 mEq/L (ref 96–112)
Chloride: 104 mEq/L (ref 96–112)
Creatinine, Ser: 2.96 mg/dL — ABNORMAL HIGH (ref 0.4–1.5)
GFR calc non Af Amer: 10 mL/min — ABNORMAL LOW (ref >60)
Glucose, Bld: 183 mg/dL — ABNORMAL HIGH (ref 70–99)
Glucose, Bld: 215 mg/dL — ABNORMAL HIGH (ref 70–99)
Potassium: 4 mEq/L (ref 3.5–5.1)
Sodium: 137 mEq/L (ref 135–145)

## 2011-02-27 LAB — HEPATITIS B SURFACE ANTIGEN: Hepatitis B Surface Ag: NEGATIVE

## 2011-02-27 LAB — URINALYSIS, ROUTINE W REFLEX MICROSCOPIC
Nitrite: NEGATIVE
Specific Gravity, Urine: 1.024 (ref 1.005–1.030)
Urobilinogen, UA: 1 mg/dL (ref 0.0–1.0)
pH: 5.5 (ref 5.0–8.0)

## 2011-02-27 LAB — CROSSMATCH
Antibody Screen: NEGATIVE
Antibody Screen: NEGATIVE

## 2011-02-27 LAB — CK TOTAL AND CKMB (NOT AT ARMC)
CK, MB: 10.5 ng/mL — ABNORMAL HIGH (ref 0.3–4.0)
CK, MB: 2.8 ng/mL (ref 0.3–4.0)
CK, MB: 3.5 ng/mL (ref 0.3–4.0)
CK, MB: 3.6 ng/mL (ref 0.3–4.0)
Relative Index: 1.9 (ref 0.0–2.5)
Total CK: 177 U/L (ref 7–232)
Total CK: 226 U/L (ref 7–232)
Total CK: 446 U/L — ABNORMAL HIGH (ref 7–232)

## 2011-02-27 LAB — TSH
TSH: 1.485 u[IU]/mL (ref 0.350–4.500)
TSH: 3.258 u[IU]/mL (ref 0.350–4.500)

## 2011-02-27 LAB — HEMOGLOBIN A1C
Hgb A1c MFr Bld: 6.7 % — ABNORMAL HIGH (ref 4.6–6.1)
Mean Plasma Glucose: 146 mg/dL

## 2011-02-27 LAB — HEPARIN ANTIBODY SCREEN: Heparin Antibody Screen: POSITIVE

## 2011-02-27 LAB — PROTIME-INR
INR: 1.5 (ref 0.00–1.49)
INR: 1.7 — ABNORMAL HIGH (ref 0.00–1.49)
Prothrombin Time: 17.7 seconds — ABNORMAL HIGH (ref 11.6–15.2)
Prothrombin Time: 20.7 seconds — ABNORMAL HIGH (ref 11.6–15.2)
Prothrombin Time: 20.9 seconds — ABNORMAL HIGH (ref 11.6–15.2)

## 2011-02-27 LAB — LIPID PANEL
HDL: 20 mg/dL — ABNORMAL LOW (ref >39)
Total CHOL/HDL Ratio: 6.6 RATIO
VLDL: 52 mg/dL — ABNORMAL HIGH (ref 0–40)

## 2011-02-27 LAB — POCT I-STAT, CHEM 8
BUN: 42 mg/dL — ABNORMAL HIGH (ref 6–23)
Creatinine, Ser: 2.4 mg/dL — ABNORMAL HIGH (ref 0.4–1.5)
Potassium: 4.3 mEq/L (ref 3.5–5.1)
Sodium: 142 mEq/L (ref 135–145)

## 2011-02-27 LAB — APTT: aPTT: 63 seconds — ABNORMAL HIGH (ref 24–37)

## 2011-02-27 LAB — URINE CULTURE

## 2011-02-27 LAB — HEPARIN INDUCED THROMBOCYTOPENIA PNL
Heparin Induced Plt Ab: POSITIVE
Serotonin Release: 6 % release (ref <20)

## 2011-02-27 LAB — HEMOGLOBIN AND HEMATOCRIT, BLOOD
HCT: 30.4 % — ABNORMAL LOW (ref 39.0–52.0)
Hemoglobin: 10 g/dL — ABNORMAL LOW (ref 13.0–17.0)

## 2011-02-27 LAB — URIC ACID
Uric Acid, Serum: 8.2 mg/dL — ABNORMAL HIGH (ref 4.0–7.8)
Uric Acid, Serum: 8.3 mg/dL — ABNORMAL HIGH (ref 4.0–7.8)

## 2011-02-27 LAB — DIFFERENTIAL
Basophils Relative: 1 % (ref 0–1)
Eosinophils Absolute: 0.5 10*3/uL (ref 0.0–0.7)
Monocytes Absolute: 0.7 10*3/uL (ref 0.1–1.0)
Monocytes Relative: 10 % (ref 3–12)
Neutrophils Relative %: 39 % — ABNORMAL LOW (ref 43–77)

## 2011-02-27 LAB — B-NATRIURETIC PEPTIDE (CONVERTED LAB): Pro B Natriuretic peptide (BNP): 1159 pg/mL — ABNORMAL HIGH (ref 0.0–100.0)

## 2011-02-27 LAB — CARDIAC PANEL(CRET KIN+CKTOT+MB+TROPI)
CK, MB: 3.5 ng/mL (ref 0.3–4.0)
Relative Index: 1.2 (ref 0.0–2.5)

## 2011-02-27 LAB — POCT CARDIAC MARKERS
CKMB, poc: 1.8 ng/mL (ref 1.0–8.0)
Myoglobin, poc: 159 ng/mL (ref 12–200)

## 2011-02-27 LAB — HEPARIN LEVEL (UNFRACTIONATED)
Heparin Unfractionated: 0.39 IU/mL (ref 0.30–0.70)
Heparin Unfractionated: <0.1 IU/mL — ABNORMAL LOW (ref 0.30–0.70)

## 2011-02-27 LAB — TROPONIN I: Troponin I: 0.27 ng/mL — ABNORMAL HIGH (ref 0.00–0.06)

## 2011-02-27 LAB — OCCULT BLOOD X 1 CARD TO LAB, STOOL: Fecal Occult Bld: NEGATIVE

## 2011-03-09 LAB — I-STAT 8, (EC8 V) (CONVERTED LAB)
Acid-Base Excess: 3 — ABNORMAL HIGH
BUN: 42 — ABNORMAL HIGH
Bicarbonate: 28.2 — ABNORMAL HIGH
Chloride: 105
Glucose, Bld: 121 — ABNORMAL HIGH
HCT: 41
Hemoglobin: 13.9
Operator id: 277751
Potassium: 4.6
Sodium: 139
TCO2: 30
pCO2, Ven: 45.6
pH, Ven: 7.399 — ABNORMAL HIGH

## 2011-03-09 LAB — B-NATRIURETIC PEPTIDE (CONVERTED LAB): Pro B Natriuretic peptide (BNP): 111 — ABNORMAL HIGH

## 2011-03-09 LAB — CARDIAC PANEL(CRET KIN+CKTOT+MB+TROPI)
CK, MB: 3.1
CK, MB: 3.5
Relative Index: 1.1
Relative Index: 1.2
Total CK: 277 — ABNORMAL HIGH
Total CK: 285 — ABNORMAL HIGH
Troponin I: 0.02
Troponin I: 0.02

## 2011-03-09 LAB — CBC
Hemoglobin: 12.1 — ABNORMAL LOW
Hemoglobin: 12.6 — ABNORMAL LOW
MCHC: 34
MCV: 94.9
RBC: 3.76 — ABNORMAL LOW
RBC: 3.87 — ABNORMAL LOW
RDW: 14.3 — ABNORMAL HIGH

## 2011-03-09 LAB — BASIC METABOLIC PANEL
CO2: 26
Chloride: 105
GFR calc Af Amer: 36 — ABNORMAL LOW
Sodium: 137

## 2011-03-09 LAB — POCT CARDIAC MARKERS
CKMB, poc: 2.3
CKMB, poc: 2.4
Myoglobin, poc: 349
Myoglobin, poc: 406
Operator id: 277751
Operator id: 277751
Troponin i, poc: <0.05
Troponin i, poc: <0.05

## 2011-03-09 LAB — URINALYSIS, ROUTINE W REFLEX MICROSCOPIC
Bilirubin Urine: NEGATIVE
Glucose, UA: NEGATIVE
Hgb urine dipstick: NEGATIVE
Ketones, ur: NEGATIVE
Nitrite: NEGATIVE
Protein, ur: NEGATIVE
Specific Gravity, Urine: 1.011
Urobilinogen, UA: 0.2
pH: 5.5

## 2011-03-09 LAB — URINE CULTURE
Colony Count: NO GROWTH
Culture: NO GROWTH

## 2011-03-09 LAB — COMPREHENSIVE METABOLIC PANEL WITH GFR
AST: 54 — ABNORMAL HIGH
BUN: 38 — ABNORMAL HIGH
CO2: 30
Chloride: 103
Creatinine, Ser: 2.82 — ABNORMAL HIGH
GFR calc Af Amer: 27 — ABNORMAL LOW
GFR calc non Af Amer: 22 — ABNORMAL LOW
Glucose, Bld: 104 — ABNORMAL HIGH
Total Bilirubin: 0.9

## 2011-03-09 LAB — COMPREHENSIVE METABOLIC PANEL
ALT: 41
Albumin: 3.8
Alkaline Phosphatase: 43
Calcium: 9.5
Potassium: 4.5
Sodium: 142
Total Protein: 6.9

## 2011-03-09 LAB — TSH: TSH: 2.736

## 2011-03-09 LAB — DIFFERENTIAL
Basophils Absolute: 0
Lymphocytes Relative: 34
Monocytes Absolute: 0.8 — ABNORMAL HIGH
Monocytes Relative: 11
Neutro Abs: 3.6

## 2011-03-09 LAB — CK TOTAL AND CKMB (NOT AT ARMC)
CK, MB: 3.1
Relative Index: 1.1
Total CK: 286 — ABNORMAL HIGH

## 2011-03-09 LAB — HEMOGLOBIN A1C
Hgb A1c MFr Bld: 6.9 — ABNORMAL HIGH
Mean Plasma Glucose: 168

## 2011-03-09 LAB — POCT I-STAT CREATININE
Creatinine, Ser: 3.2 — ABNORMAL HIGH
Operator id: 277751

## 2011-03-09 LAB — APTT: aPTT: <20 — ABNORMAL LOW

## 2011-03-09 LAB — PROTIME-INR
INR: 1
Prothrombin Time: 13.1

## 2011-03-09 LAB — TROPONIN I: Troponin I: 0.02

## 2011-04-09 ENCOUNTER — Ambulatory Visit (INDEPENDENT_AMBULATORY_CARE_PROVIDER_SITE_OTHER): Payer: BC Managed Care – PPO | Admitting: Internal Medicine

## 2011-04-09 ENCOUNTER — Encounter: Payer: Self-pay | Admitting: Internal Medicine

## 2011-04-09 VITALS — BP 138/84 | HR 85 | Ht 69.0 in | Wt 254.4 lb

## 2011-04-09 DIAGNOSIS — G4733 Obstructive sleep apnea (adult) (pediatric): Secondary | ICD-10-CM

## 2011-04-09 DIAGNOSIS — J42 Unspecified chronic bronchitis: Secondary | ICD-10-CM

## 2011-04-09 DIAGNOSIS — J449 Chronic obstructive pulmonary disease, unspecified: Secondary | ICD-10-CM

## 2011-04-09 MED ORDER — TIOTROPIUM BROMIDE MONOHYDRATE 18 MCG IN CAPS: 18.0000 ug | ORAL_CAPSULE | Freq: Every day | RESPIRATORY_TRACT | Status: DC

## 2011-04-09 NOTE — Patient Instructions (Addendum)
Sample Spiriva, one daily       Script for  [facility]- schedule PFT-  Dx COPD

## 2011-04-09 NOTE — Progress Notes (Signed)
Subjective:    Patient ID: [name], male    DOB: 1933/07/12, 77 y.o.   MRN: [id number]  HPI 10/06/10- 47 yoM former smoker followed for sleep apnea, bronchitis, complicated by CAD with hx acute and chronic respiratory failure, diverticular bleed with anemia. Gout. Wife here. Last here July 07, 2010 . A CT had suggested [facility] with bronchitis. He had mentioned palpitations last time, but no recent concern.  Gout has flared. On maintenance prednisone 5 mg/day for gout. Occasionally some mild chest congestion but Dulera 200 has controlled him. He would like to have a rescue inhaler.  He continues to use CPAP 13/ SMS all night every night, with O2 for sleep at 3L/M.   04/09/11- 10/06/10- 77 yoM former smoker followed for sleep apnea, bronchitis, complicated by CAD with hx acute and chronic respiratory failure, diverticular bleed with anemia. Gout. Wife here. They describe increased cough, wheeze, shortness of breath for several months with little change from day to day or with weather. He has portable oxygen which he doesn't use, just adding oxygen to his CPAP at night. Cough productive of some light yellow sputum with no blood no chest pain. He did have a cold a few weeks ago at his primary physician and given antibiotic. Using his rescue inhaler 2 or 3 times per week. Nebulizer has been a big help, used at least once daily. He is taking prednisone 5 mg daily for gout. He like to Spiriva but it cost too much. Continues CPAP 13+ oxygen with good compliance and control. Chest x-ray: 11/20/2010-CE, hyperinflation, no active process. A ventilation perfusion lung scan on 11/20/2010 showed air trapping with no PE.   Review of Systems Constitutional:   No-   weight loss, night sweats, fevers, chills, fatigue, lassitude. HEENT:   No-  headaches, difficulty swallowing, tooth/dental problems, sore throat,       No-  sneezing, itching, ear ache, nasal congestion, post nasal drip,  CV:  No-   chest  pain, orthopnea, PND, swelling in lower extremities, anasarca,                                  dizziness, palpitations Resp: +   shortness of breath with exertion or at rest.              +   productive cough,  No non-productive cough,  No- coughing up of blood.            +  change in color of mucus.  No- wheezing.   Skin: No-   rash or lesions. GI:  No-   heartburn, indigestion, abdominal pain, nausea, vomiting, diarrhea,                 change in bowel habits, loss of appetite GU: No-   dysuria, change in color of urine, no urgency or frequency.  No- flank pain. MS:  No-   joint pain or swelling.  No- decreased range of motion.  No- back pain. Neuro-     nothing unusual Psych:  No- change in mood or affect. No depression or anxiety.  No memory loss.       Objective:   Physical Exam General- Alert, Oriented, Affect-appropriate, Distress- none acute; obese Skin- rash-none, lesions- none, excoriation- none Lymphadenopathy- none Head- atraumatic            Eyes- Gross vision intact, PERRLA, conjunctivae clear secretions  Ears- Hearing, canals-normal            Nose- Clear, no-Septal dev, mucus, polyps, erosion, perforation             Throat- Mallampati II , mucosa clear , drainage- none, tonsils- atrophic Neck- flexible , trachea midline, no stridor , thyroid nl, carotid no bruit Chest - symmetrical excursion , unlabored           Heart/CV- RRR , no murmur , no gallop  , no rub, nl s1 s2                           - JVD- none , edema- none, stasis changes- none, varices- none           Lung- bilateral crackles, unlabored, wheeze- none, cough+ with deep breath , dullness-none, rub- none           Chest wall-  Abd- tender-no, distended-no, bowel sounds-present, HSM- no Br/ Gen/ Rectal- Not done, not indicated Extrem- cyanosis- none, clubbing, none, atrophy- none, strength- nl Neuro- grossly intact to observation

## 2011-04-12 NOTE — Assessment & Plan Note (Addendum)
He had like to Spiriva and is going to try it again. Plan-schedule PFT

## 2011-04-12 NOTE — Assessment & Plan Note (Signed)
Good compliance and control with no concerns expressed.

## 2011-04-15 ENCOUNTER — Ambulatory Visit (INDEPENDENT_AMBULATORY_CARE_PROVIDER_SITE_OTHER): Payer: BC Managed Care – PPO | Admitting: Internal Medicine

## 2011-04-15 DIAGNOSIS — J449 Chronic obstructive pulmonary disease, unspecified: Secondary | ICD-10-CM

## 2011-04-15 LAB — PULMONARY FUNCTION TEST

## 2011-04-15 NOTE — Progress Notes (Signed)
PFT done today. 

## 2011-04-20 ENCOUNTER — Other Ambulatory Visit: Payer: Self-pay | Admitting: Internal Medicine

## 2011-04-20 MED ORDER — TIOTROPIUM BROMIDE MONOHYDRATE 18 MCG IN CAPS: 18.0000 ug | ORAL_CAPSULE | Freq: Every day | RESPIRATORY_TRACT | Status: DC

## 2011-04-20 NOTE — Telephone Encounter (Signed)
I spoke with pt and is aware spiriva was sent to [facility]. Pt needed nothing further

## 2011-04-21 MED ORDER — TIOTROPIUM BROMIDE MONOHYDRATE 18 MCG IN CAPS: 18.0000 ug | ORAL_CAPSULE | Freq: Every day | RESPIRATORY_TRACT | Status: DC

## 2011-04-21 NOTE — Telephone Encounter (Signed)
Addended by: Reynaldo Minium C on: 04/21/2011 11:59 AM   Modules accepted: Orders

## 2011-04-24 ENCOUNTER — Telehealth: Payer: Self-pay | Admitting: Internal Medicine

## 2011-04-24 NOTE — Telephone Encounter (Signed)
PT informed of PFT results per Dr Maple Hudson

## 2011-05-02 ENCOUNTER — Inpatient Hospital Stay (HOSPITAL_COMMUNITY)
Admission: EM | Admit: 2011-05-02 | Discharge: 2011-05-09 | DRG: 541 | Disposition: A | Discharge status: Home Health | Payer: BC Managed Care – PPO | Attending: Family Medicine | Admitting: Family Medicine

## 2011-05-02 ENCOUNTER — Emergency Department (HOSPITAL_COMMUNITY): Payer: BC Managed Care – PPO

## 2011-05-02 ENCOUNTER — Encounter (HOSPITAL_COMMUNITY): Payer: Self-pay | Admitting: Emergency Medicine

## 2011-05-02 ENCOUNTER — Other Ambulatory Visit: Payer: Self-pay

## 2011-05-02 DIAGNOSIS — N179 Acute kidney failure, unspecified: Secondary | ICD-10-CM | POA: Diagnosis present

## 2011-05-02 DIAGNOSIS — I129 Hypertensive chronic kidney disease with stage 1 through stage 4 chronic kidney disease, or unspecified chronic kidney disease: Secondary | ICD-10-CM | POA: Diagnosis present

## 2011-05-02 DIAGNOSIS — D62 Acute posthemorrhagic anemia: Secondary | ICD-10-CM | POA: Diagnosis not present

## 2011-05-02 DIAGNOSIS — B957 Other staphylococcus as the cause of diseases classified elsewhere: Secondary | ICD-10-CM | POA: Diagnosis present

## 2011-05-02 DIAGNOSIS — H6690 Otitis media, unspecified, unspecified ear: Secondary | ICD-10-CM | POA: Diagnosis present

## 2011-05-02 DIAGNOSIS — I5032 Chronic diastolic (congestive) heart failure: Secondary | ICD-10-CM | POA: Diagnosis present

## 2011-05-02 DIAGNOSIS — J440 Chronic obstructive pulmonary disease with acute lower respiratory infection: Principal | ICD-10-CM | POA: Diagnosis present

## 2011-05-02 DIAGNOSIS — Z87891 Personal history of nicotine dependence: Secondary | ICD-10-CM

## 2011-05-02 DIAGNOSIS — I2510 Atherosclerotic heart disease of native coronary artery without angina pectoris: Secondary | ICD-10-CM | POA: Diagnosis present

## 2011-05-02 DIAGNOSIS — F0280 Dementia in other diseases classified elsewhere without behavioral disturbance: Secondary | ICD-10-CM | POA: Diagnosis present

## 2011-05-02 DIAGNOSIS — E119 Type 2 diabetes mellitus without complications: Secondary | ICD-10-CM | POA: Diagnosis present

## 2011-05-02 DIAGNOSIS — R195 Other fecal abnormalities: Secondary | ICD-10-CM | POA: Diagnosis present

## 2011-05-02 DIAGNOSIS — M109 Gout, unspecified: Secondary | ICD-10-CM | POA: Diagnosis present

## 2011-05-02 DIAGNOSIS — K208 Other esophagitis without bleeding: Secondary | ICD-10-CM | POA: Diagnosis present

## 2011-05-02 DIAGNOSIS — N189 Chronic kidney disease, unspecified: Secondary | ICD-10-CM | POA: Diagnosis present

## 2011-05-02 DIAGNOSIS — J189 Pneumonia, unspecified organism: Secondary | ICD-10-CM

## 2011-05-02 DIAGNOSIS — D649 Anemia, unspecified: Secondary | ICD-10-CM

## 2011-05-02 DIAGNOSIS — G309 Alzheimer's disease, unspecified: Secondary | ICD-10-CM | POA: Diagnosis present

## 2011-05-02 DIAGNOSIS — K5731 Diverticulosis of large intestine without perforation or abscess with bleeding: Secondary | ICD-10-CM

## 2011-05-02 DIAGNOSIS — Z794 Long term (current) use of insulin: Secondary | ICD-10-CM

## 2011-05-02 DIAGNOSIS — IMO0002 Reserved for concepts with insufficient information to code with codable children: Secondary | ICD-10-CM

## 2011-05-02 DIAGNOSIS — R509 Fever, unspecified: Secondary | ICD-10-CM

## 2011-05-02 DIAGNOSIS — Q2733 Arteriovenous malformation of digestive system vessel: Secondary | ICD-10-CM

## 2011-05-02 DIAGNOSIS — J42 Unspecified chronic bronchitis: Secondary | ICD-10-CM | POA: Diagnosis present

## 2011-05-02 DIAGNOSIS — J9620 Acute and chronic respiratory failure, unspecified whether with hypoxia or hypercapnia: Secondary | ICD-10-CM

## 2011-05-02 DIAGNOSIS — G4733 Obstructive sleep apnea (adult) (pediatric): Secondary | ICD-10-CM | POA: Diagnosis present

## 2011-05-02 DIAGNOSIS — Z951 Presence of aortocoronary bypass graft: Secondary | ICD-10-CM

## 2011-05-02 DIAGNOSIS — K922 Gastrointestinal hemorrhage, unspecified: Secondary | ICD-10-CM

## 2011-05-02 DIAGNOSIS — I509 Heart failure, unspecified: Secondary | ICD-10-CM | POA: Diagnosis present

## 2011-05-02 DIAGNOSIS — J209 Acute bronchitis, unspecified: Principal | ICD-10-CM

## 2011-05-02 DIAGNOSIS — K5521 Angiodysplasia of colon with hemorrhage: Secondary | ICD-10-CM | POA: Diagnosis present

## 2011-05-02 HISTORY — DX: Unspecified osteoarthritis, unspecified site: M19.90

## 2011-05-02 HISTORY — DX: Heart failure, unspecified: I50.9

## 2011-05-02 HISTORY — DX: Acute myocardial infarction, unspecified: I21.9

## 2011-05-02 HISTORY — DX: Anemia, unspecified: D64.9

## 2011-05-02 HISTORY — DX: Pneumonia, unspecified organism: J18.9

## 2011-05-02 LAB — URINALYSIS, ROUTINE W REFLEX MICROSCOPIC
Nitrite: NEGATIVE
Protein, ur: NEGATIVE mg/dL
Specific Gravity, Urine: 1.015 (ref 1.005–1.030)
Urobilinogen, UA: 0.2 mg/dL (ref 0.0–1.0)

## 2011-05-02 LAB — DIFFERENTIAL
Basophils Absolute: 0 10*3/uL (ref 0.0–0.1)
Lymphocytes Relative: 13 % (ref 12–46)
Monocytes Absolute: 1.2 10*3/uL — ABNORMAL HIGH (ref 0.1–1.0)
Neutro Abs: 12.6 10*3/uL — ABNORMAL HIGH (ref 1.7–7.7)
Neutrophils Relative %: 76 % (ref 43–77)

## 2011-05-02 LAB — PRO B NATRIURETIC PEPTIDE: Pro B Natriuretic peptide (BNP): 898.7 pg/mL — ABNORMAL HIGH (ref 0–450)

## 2011-05-02 LAB — COMPREHENSIVE METABOLIC PANEL
ALT: 14 U/L (ref 0–53)
AST: 23 U/L (ref 0–37)
Alkaline Phosphatase: 81 U/L (ref 39–117)
CO2: 23 mEq/L (ref 19–32)
Chloride: 100 mEq/L (ref 96–112)
Creatinine, Ser: 2.76 mg/dL — ABNORMAL HIGH (ref 0.50–1.35)
GFR calc non Af Amer: 21 mL/min — ABNORMAL LOW (ref >90)
Potassium: 4.5 mEq/L (ref 3.5–5.1)
Total Bilirubin: 0.3 mg/dL (ref 0.3–1.2)

## 2011-05-02 LAB — INFLUENZA PANEL BY PCR (TYPE A & B)
H1N1 flu by pcr: NOT DETECTED
Influenza A By PCR: NEGATIVE
Influenza B By PCR: NEGATIVE

## 2011-05-02 LAB — CBC
HCT: 30 % — ABNORMAL LOW (ref 39.0–52.0)
Hemoglobin: 9 g/dL — ABNORMAL LOW (ref 13.0–17.0)
RDW: 18.3 % — ABNORMAL HIGH (ref 11.5–15.5)
WBC: 16.5 10*3/uL — ABNORMAL HIGH (ref 4.0–10.5)

## 2011-05-02 LAB — CK TOTAL AND CKMB (NOT AT ARMC): CK, MB: 3 ng/mL (ref 0.3–4.0)

## 2011-05-02 LAB — GLUCOSE, CAPILLARY: Glucose-Capillary: 123 mg/dL — ABNORMAL HIGH (ref 70–99)

## 2011-05-02 MED ORDER — EZETIMIBE 10 MG PO TABS
10.0000 mg | ORAL_TABLET | Freq: Every day | ORAL | Status: DC
  Administered 2011-05-02 – 2011-05-09 (×8): 10 mg via ORAL
  Filled 2011-05-02 (×9): qty 1

## 2011-05-02 MED ORDER — CHLORHEXIDINE GLUCONATE CLOTH 2 % EX PADS
6.0000 | MEDICATED_PAD | Freq: Every day | CUTANEOUS | Status: AC
  Administered 2011-05-03 – 2011-05-07 (×5): 6 via TOPICAL

## 2011-05-02 MED ORDER — FOLIC ACID 400 MCG PO TABS: 400.0000 ug | ORAL_TABLET | Freq: Every day | ORAL | Status: DC

## 2011-05-02 MED ORDER — MOXIFLOXACIN HCL IN NACL 400 MG/250ML IV SOLN
400.0000 mg | INTRAVENOUS | Status: DC
  Administered 2011-05-02: 400 mg via INTRAVENOUS
  Filled 2011-05-02 (×2): qty 250

## 2011-05-02 MED ORDER — ESCITALOPRAM OXALATE 10 MG PO TABS
10.0000 mg | ORAL_TABLET | Freq: Every day | ORAL | Status: DC
  Administered 2011-05-02 – 2011-05-09 (×8): 10 mg via ORAL
  Filled 2011-05-02 (×9): qty 1

## 2011-05-02 MED ORDER — GABAPENTIN 100 MG PO CAPS
100.0000 mg | ORAL_CAPSULE | Freq: Every day | ORAL | Status: DC
  Administered 2011-05-02 – 2011-05-08 (×7): 100 mg via ORAL
  Filled 2011-05-02 (×9): qty 1

## 2011-05-02 MED ORDER — NITROGLYCERIN 0.4 MG SL SUBL: 0.4000 mg | SUBLINGUAL_TABLET | SUBLINGUAL | Status: DC | PRN

## 2011-05-02 MED ORDER — DICLOFENAC SODIUM 0.1 % OP SOLN
1.0000 [drp] | Freq: Two times a day (BID) | OPHTHALMIC | Status: DC
  Administered 2011-05-03 – 2011-05-09 (×13): 1 [drp] via OPHTHALMIC
  Filled 2011-05-02 (×2): qty 2.5

## 2011-05-02 MED ORDER — MUPIROCIN 2 % EX OINT
1.0000 "application " | TOPICAL_OINTMENT | Freq: Two times a day (BID) | CUTANEOUS | Status: AC
  Administered 2011-05-02 – 2011-05-07 (×10): 1 via NASAL
  Filled 2011-05-02: qty 22

## 2011-05-02 MED ORDER — FOLIC ACID 0.5 MG HALF TAB
0.5000 mg | ORAL_TABLET | Freq: Every day | ORAL | Status: DC
  Administered 2011-05-02 – 2011-05-09 (×8): 0.5 mg via ORAL
  Filled 2011-05-02 (×10): qty 1

## 2011-05-02 MED ORDER — ALLOPURINOL 300 MG PO TABS
300.0000 mg | ORAL_TABLET | Freq: Every day | ORAL | Status: DC
  Administered 2011-05-02 – 2011-05-09 (×8): 300 mg via ORAL
  Filled 2011-05-02 (×9): qty 1

## 2011-05-02 MED ORDER — SODIUM CHLORIDE 0.9 % IV SOLN
INTRAVENOUS | Status: AC
  Administered 2011-05-02: 18:00:00 via INTRAVENOUS

## 2011-05-02 MED ORDER — ACETAMINOPHEN 500 MG PO TABS
1000.0000 mg | ORAL_TABLET | Freq: Once | ORAL | Status: AC
  Administered 2011-05-02 (×2): 1000 mg via ORAL
  Filled 2011-05-02: qty 2

## 2011-05-02 MED ORDER — ISOSORBIDE MONONITRATE ER 30 MG PO TB24
30.0000 mg | ORAL_TABLET | Freq: Every day | ORAL | Status: DC
  Administered 2011-05-02 – 2011-05-09 (×8): 30 mg via ORAL
  Filled 2011-05-02 (×9): qty 1

## 2011-05-02 MED ORDER — INSULIN ASPART 100 UNIT/ML ~~LOC~~ SOLN
0.0000 [IU] | Freq: Three times a day (TID) | SUBCUTANEOUS | Status: DC
  Administered 2011-05-03 – 2011-05-04 (×4): 2 [IU] via SUBCUTANEOUS
  Administered 2011-05-04: 3 [IU] via SUBCUTANEOUS
  Administered 2011-05-04 – 2011-05-06 (×2): 2 [IU] via SUBCUTANEOUS
  Administered 2011-05-06 – 2011-05-07 (×2): 3 [IU] via SUBCUTANEOUS
  Administered 2011-05-07 – 2011-05-08 (×3): 2 [IU] via SUBCUTANEOUS
  Administered 2011-05-08: 3 [IU] via SUBCUTANEOUS
  Filled 2011-05-02: qty 3

## 2011-05-02 MED ORDER — MOXIFLOXACIN HCL IN NACL 400 MG/250ML IV SOLN
400.0000 mg | Freq: Once | INTRAVENOUS | Status: AC
  Administered 2011-05-02: 400 mg via INTRAVENOUS
  Filled 2011-05-02: qty 250

## 2011-05-02 MED ORDER — ALBUTEROL SULFATE (5 MG/ML) 0.5% IN NEBU
2.5000 mg | INHALATION_SOLUTION | RESPIRATORY_TRACT | Status: DC | PRN
  Administered 2011-05-03 – 2011-05-04 (×6): 2.5 mg via RESPIRATORY_TRACT
  Filled 2011-05-02 (×6): qty 0.5

## 2011-05-02 MED ORDER — COLCHICINE 0.6 MG PO TABS
0.6000 mg | ORAL_TABLET | Freq: Every day | ORAL | Status: DC
  Administered 2011-05-02 – 2011-05-09 (×8): 0.6 mg via ORAL
  Filled 2011-05-02 (×9): qty 1

## 2011-05-02 MED ORDER — INSULIN GLARGINE 100 UNIT/ML ~~LOC~~ SOLN
56.0000 [IU] | SUBCUTANEOUS | Status: DC
  Administered 2011-05-02 – 2011-05-08 (×7): 56 [IU] via SUBCUTANEOUS
  Filled 2011-05-02 (×4): qty 3

## 2011-05-02 MED ORDER — ACETAMINOPHEN 325 MG PO TABS
ORAL_TABLET | ORAL | Status: AC
  Administered 2011-05-02: 1000 mg via ORAL
  Filled 2011-05-02: qty 3

## 2011-05-02 MED ORDER — SODIUM CHLORIDE 0.9 % IV SOLN
Freq: Once | INTRAVENOUS | Status: DC
Start: 2011-05-02 — End: 2011-05-02

## 2011-05-02 MED ORDER — IPRATROPIUM BROMIDE 0.02 % IN SOLN
0.5000 mg | RESPIRATORY_TRACT | Status: DC
  Administered 2011-05-02 – 2011-05-04 (×13): 0.5 mg via RESPIRATORY_TRACT
  Filled 2011-05-02 (×13): qty 2.5

## 2011-05-02 MED ORDER — ASPIRIN 325 MG PO TABS
325.0000 mg | ORAL_TABLET | Freq: Every day | ORAL | Status: DC
  Administered 2011-05-02 – 2011-05-04 (×3): 325 mg via ORAL
  Filled 2011-05-02 (×4): qty 1

## 2011-05-02 MED ORDER — INSULIN GLARGINE 100 UNIT/ML ~~LOC~~ SOLN
53.0000 [IU] | Freq: Every day | SUBCUTANEOUS | Status: DC
  Administered 2011-05-03 – 2011-05-09 (×7): 53 [IU] via SUBCUTANEOUS
  Filled 2011-05-02: qty 3

## 2011-05-02 MED ORDER — PREDNISONE 5 MG PO TABS
5.0000 mg | ORAL_TABLET | Freq: Every day | ORAL | Status: DC
  Administered 2011-05-03 – 2011-05-05 (×2): 5 mg via ORAL
  Filled 2011-05-02 (×2): qty 1

## 2011-05-02 MED ORDER — SIMVASTATIN 40 MG PO TABS
40.0000 mg | ORAL_TABLET | Freq: Every day | ORAL | Status: DC
  Administered 2011-05-02 – 2011-05-09 (×8): 40 mg via ORAL
  Filled 2011-05-02 (×9): qty 1

## 2011-05-02 NOTE — ED Notes (Signed)
Patient is resting comfortably. 

## 2011-05-02 NOTE — ED Notes (Signed)
1 grey sent to lab

## 2011-05-02 NOTE — ED Provider Notes (Signed)
History     CSN: [id number] Arrival date & time: 05/02/2011  7:36 AM   First MD Initiated Contact with Patient 05/02/11 [phone]      Chief Complaint  Patient presents with  . Influenza    (Consider location/radiation/quality/duration/timing/severity/associated sxs/prior treatment) HPI Comments: Patient has history of cad, cabg, chf.  Has chronic cough, but worse for the past 3 days.  Now with fever, weakness.    Patient is a 77 y.o. male presenting with cough. The history is provided by the patient and a relative.  Cough This is a new problem. The current episode started more than 2 days ago. The problem occurs constantly. The problem has been gradually worsening. The cough is productive of sputum. The maximum temperature recorded prior to his arrival was 103 to 104 F. Associated symptoms include chills. Pertinent negatives include no chest pain. He has tried nothing for the symptoms. He is not a smoker.    Past Medical History  Diagnosis Date  . Adenomatous colon polyp   . Diverticulosis   . GERD (gastroesophageal reflux disease)   . Hiatal hernia   . Alzheimer disease   . Gout   . Renal insufficiency   . DM (diabetes mellitus)   . Asthma with bronchitis   . COPD (chronic obstructive pulmonary disease)   . Chronic hypoxemic respiratory failure   . OSA (obstructive sleep apnea)     Past Surgical History  Procedure Date  . Coronary artery bypass graft   . Hand surgery     Family History  Problem Relation Age of Onset  . Melanoma Mother   . Lung cancer Brother   . Diabetes    . Heart disease    . Stomach cancer      grandfather    History  Substance Use Topics  . Smoking status: Former Games developer  . Smokeless tobacco: Not on file  . Alcohol Use: No      Review of Systems  Constitutional: Positive for fever, chills and fatigue.  Respiratory: Positive for cough.   Cardiovascular: Negative for chest pain.  All other systems reviewed and are  negative.    Allergies  Cefuroxime; Heparin; Metoprolol; Morphine; and Sulfonamide derivatives  Home Medications   Current Outpatient Rx  Name Route Sig Dispense Refill  . ACETAMINOPHEN ER 650 MG PO TBCR Oral Take 1,300 mg by mouth 2 (two) times daily.      . ALBUTEROL SULFATE HFA 108 (90 BASE) MCG/ACT IN AERS Inhalation Inhale 2 puffs into the lungs every 6 (six) hours as needed for wheezing. 1 Inhaler 12  . ALBUTEROL SULFATE (2.5 MG/3ML) 0.083% IN NEBU Nebulization Take 2.5 mg by nebulization every 6 (six) hours as needed.      . ALLOPURINOL 300 MG PO TABS Oral Take 1 tablet by mouth Twice daily.    . ASPIRIN 325 MG PO TABS Oral Take 325 mg by mouth daily.      . ATORVASTATIN CALCIUM 20 MG PO TABS Oral Take 1 tablet by mouth daily.    . BUMETANIDE 2 MG PO TABS Oral Take 1 tablet by mouth Twice daily.    Marland Kitchen CINNAMON 500 MG PO CAPS  2 capsules by mouth once daily     . COLCHICINE 0.6 MG PO TABS Oral Take 0.6 mg by mouth 2 (two) times daily.      Marland Kitchen ESCITALOPRAM OXALATE 10 MG PO TABS Oral Take 10 mg by mouth daily.      Marland Kitchen EZETIMIBE 10 MG PO  TABS Oral Take 10 mg by mouth daily.      Marland Kitchen FOLIC ACID 400 MCG PO TABS Oral Take 400 mcg by mouth daily.      Marland Kitchen GABAPENTIN 100 MG PO TABS Oral Take 100 mg by mouth at bedtime.      . INSULIN GLARGINE 100 UNIT/ML [place] SOLN Subcutaneous Inject 50 Units into the skin daily.     . ISOSORBIDE DINITRATE 30 MG PO TABS Oral Take 30 mg by mouth daily.      . ISOSORBIDE MONONITRATE ER 30 MG PO TB24 Oral Take 1 tablet by mouth daily.    Marland Kitchen LOSARTAN POTASSIUM 50 MG PO TABS Oral Take 1 tablet by mouth daily.    Marland Kitchen METOLAZONE 2.5 MG PO TABS Oral Take 1 tablet by mouth every other day.    . MOMETASONE FURO-FORMOTEROL FUM 100-5 MCG/ACT IN AERO Inhalation Inhale 2 puffs into the lungs 2 (two) times daily.      Marland Kitchen ONE-DAILY MULTI VITAMINS PO TABS Oral Take 1 tablet by mouth daily.      Marland Kitchen NITROGLYCERIN 0.4 MG SL SUBL Sublingual Place 0.4 mg under the tongue every 5 (five)  minutes as needed. May repeat x 3     . NOVOLOG FLEXPEN 100 UNIT/ML [place] SOLN  03-14-19 before meals    . PANTOPRAZOLE SODIUM 40 MG PO TBEC Oral Take 40 mg by mouth daily.      Marland Kitchen PREDNISONE 5 MG PO TABS Oral Take 5 mg by mouth daily.      Marland Kitchen TIOTROPIUM BROMIDE MONOHYDRATE 18 MCG IN CAPS Inhalation Place 1 capsule (18 mcg total) into inhaler and inhale daily. 90 capsule 3    BP 121/41  Pulse 103  Temp(Src) 104.8 F (40.4 C) (Rectal)  Resp 32  SpO2 10%  Physical Exam  Nursing note and vitals reviewed. Constitutional: He is oriented to person, place, and time. He appears well-developed and well-nourished. No distress.       Appears ill.  Eyes: EOM are normal. Pupils are equal, round, and reactive to light.  Neck: Normal range of motion. Neck supple.  Cardiovascular: Normal rate and regular rhythm.  Exam reveals no gallop and no friction rub.   No murmur heard. Pulmonary/Chest: Effort normal.       Bilateral rhonchi.  Abdominal: Soft. Bowel sounds are normal. He exhibits no distension. There is no tenderness.  Musculoskeletal: Normal range of motion. He exhibits no edema.  Lymphadenopathy:    He has no cervical adenopathy.  Neurological: He is alert and oriented to person, place, and time.  Skin: Skin is warm and dry. He is not diaphoretic.    ED Course  Procedures (including critical care time)  Labs Reviewed - No data to display No results found.   No diagnosis found.   Date: 05/02/2011  Rate: 101  Rhythm: sinus tachycardia  QRS Axis: normal  Intervals: normal  ST/T Wave abnormalities: nonspecific ST changes  Conduction Disutrbances:none  Narrative Interpretation:   Old EKG Reviewed: unchanged    MDM  Fever, elevated wbc with cough.  Influenza vs. Pneumonia.  Will give antibiotics and admit to medicine.        Geoffery Lyons, MD 05/02/11 1022

## 2011-05-02 NOTE — Progress Notes (Signed)
CRITICAL VALUE ALERT  Critical value received: positive MRSA screen  Date of notification:  05-02-11  Time of notification:  0725  Critical value read back:yes  Nurse who received alert:  [id number]  MD notified (1st page):   Time of first page:   MD notified (2nd page):  Time of second page:  Responding MD:    Time MD responded:

## 2011-05-02 NOTE — ED Notes (Signed)
Family at bedside. 

## 2011-05-02 NOTE — ED Notes (Signed)
Vital signs stable. 

## 2011-05-02 NOTE — ED Notes (Signed)
Report to [employer] in [place]. Pt to [facility] for holding pt remains lethargic at this time. Family remains at bedside.

## 2011-05-02 NOTE — ED Notes (Signed)
Pt presenting to ed via ems with c/o flu like symptoms, cough and fever x 3 days

## 2011-05-02 NOTE — ED Notes (Signed)
Blood cultures x 2, 2 lavendars, 1 blue, 1 dark green, 1 gold, 1 red, 1 light green sent to lab

## 2011-05-02 NOTE — ED Notes (Signed)
Admitting team at bedside.

## 2011-05-02 NOTE — H&P (Signed)
PCP:   Kaleen Mask, MD   Chief Complaint:  Fever  HPI: 77 year old male with history of CAD status post coronary stents, CABG, diastolic congestive heart failure who came to the hospital with three-day history of fever and cough with purulent sputum. Patient had high-grade fever of 10 45F which started 3 days ago. He also had intermittent nausea, vomiting, loose stools. Patient takes diuretics for CHF and has been taking his Bumex over the past 3 days, despite not being able to take anything by mouth due his illness. Patient denies any chest pain, no photophobia, no neck pain. Patient is a history of COPD, sleep apnea. Chest x-ray done in the ER shows no pneumonia, it only shows cardiomegaly without acute process.  Allergies:   Allergies  Allergen Reactions  . Cefuroxime Other (See Comments)    REACTION: unkown reaction  . Donepezil Diarrhea  . Heparin Itching  . Hydralazine Other (See Comments)    DO NOT TAKE PER MD  . Hydromorphone Itching  . Metoprolol Other (See Comments)    unknown  . Morphine Itching    REACTION: itching  . Plavix (Clopidogrel Bisulfate) Other (See Comments)    GI BLEEDING  . Promethazine Other (See Comments)    DO NOT TAKE PER MD  . Sulfonamide Derivatives Itching  . Zaroxolyn (Metolazone) Diarrhea  . Ertapenem Itching and Rash      Past Medical History  Diagnosis Date  . Adenomatous colon polyp   . Diverticulosis   . GERD (gastroesophageal reflux disease)   . Hiatal hernia   . Alzheimer disease   . Gout   . Renal insufficiency   . DM (diabetes mellitus)   . Asthma with bronchitis   . COPD (chronic obstructive pulmonary disease)   . Chronic hypoxemic respiratory failure   . OSA (obstructive sleep apnea)     Past Surgical History  Procedure Date  . Coronary artery bypass graft   . Hand surgery     Prior to Admission medications   Medication Sig Start Date End Date Taking? Authorizing Provider  acetaminophen (TYLENOL) 650 MG CR  tablet Take 1,300 mg by mouth 2 (two) times daily.    Yes Historical Provider, MD  albuterol (PROVENTIL) (2.5 MG/3ML) 0.083% nebulizer solution Take 2.5 mg by nebulization every 6 (six) hours as needed. For shortness of breath   Yes Historical Provider, MD  allopurinol (ZYLOPRIM) 300 MG tablet Take 1 tablet by mouth Twice daily. 02/17/11  Yes Historical Provider, MD  aspirin 325 MG tablet Take 325 mg by mouth daily.     Yes Historical Provider, MD  atorvastatin (LIPITOR) 20 MG tablet Take 1 tablet by mouth daily. 02/24/11  Yes Historical Provider, MD  bumetanide (BUMEX) 2 MG tablet Take 1.5 tablets by mouth Twice daily.  03/09/11  Yes Historical Provider, MD  colchicine 0.6 MG tablet Take 0.6 mg by mouth daily.    Yes Historical Provider, MD  diclofenac (VOLTAREN) 0.1 % ophthalmic solution Place 1 drop into both eyes 2 (two) times daily.     Yes Historical Provider, MD  escitalopram (LEXAPRO) 10 MG tablet Take 10 mg by mouth daily.     Yes Historical Provider, MD  ezetimibe (ZETIA) 10 MG tablet Take 10 mg by mouth daily.     Yes Historical Provider, MD  folic acid (FOLVITE) 400 MCG tablet Take 400 mcg by mouth daily.     Yes Historical Provider, MD  gabapentin (NEURONTIN) 100 MG tablet Take 100 mg by mouth at bedtime.  Yes Historical Provider, MD  insulin glargine (LANTUS) 100 UNIT/ML injection Inject 53-56 Units into the skin 2 (two) times daily. 53 units in the morning and 56 units in the evening   Yes Historical Provider, MD  isosorbide mononitrate (IMDUR) 30 MG 24 hr tablet Take 1 tablet by mouth daily. 03/09/11  Yes Historical Provider, MD  lisinopril (PRINIVIL,ZESTRIL) 20 MG tablet Take 20 mg by mouth 2 (two) times daily.     Yes Historical Provider, MD  Multiple Vitamin (MULTIVITAMIN) tablet Take 0.5 tablets by mouth 2 (two) times daily.    Yes Historical Provider, MD  NOVOLOG FLEXPEN 100 UNIT/ML injection Inject 10-20 Units into the skin 3 (three) times daily. 10 units in the morning,20  units at noon, and 20 in the evening before meals 02/24/11  Yes Historical Provider, MD  nystatin (MYCOSTATIN) 100000 UNIT/ML suspension Take 5 mLs by mouth 4 (four) times daily as needed. For thrush.    Yes Historical Provider, MD  pantoprazole (PROTONIX) 40 MG tablet Take 40 mg by mouth daily.     Yes Historical Provider, MD  predniSONE (DELTASONE) 5 MG tablet Take 5 mg by mouth daily.     Yes Historical Provider, MD  tiotropium (SPIRIVA) 18 MCG inhalation capsule Place 18 mcg into inhaler and inhale daily.   04/21/11 04/20/12 Yes Clinton D Young, MD  albuterol (PROAIR HFA) 108 (90 BASE) MCG/ACT inhaler Inhale 2 puffs into the lungs every 6 (six) hours as needed for wheezing. 10/06/10 10/06/11  Waymon Budge, MD  clobetasol cream (TEMOVATE) 0.05 % Apply 1 application topically 2 (two) times daily as needed. For rash.     Historical Provider, MD  nitroGLYCERIN (NITROSTAT) 0.4 MG SL tablet Place 0.4 mg under the tongue every 5 (five) minutes as needed. May repeat x 3     Historical Provider, MD  PRESCRIPTION MEDICATION Apply 1 application topically 2 (two) times daily as needed. For rash. (Fluticasone/Ketoconazole) topical compounded cream     Historical Provider, MD    Social History:  reports that he has quit smoking. He does not have any smokeless tobacco history on file. He reports that he does not drink alcohol or use illicit drugs.  Family History  Problem Relation Age of Onset  . Melanoma Mother   . Lung cancer Brother   . Diabetes    . Heart disease    . Stomach cancer      grandfather    Review of Systems:  Constitutional:Positive fever, appetite change and fatigue. HEENT: Denies photophobia, eye pain, redness,, neck pain, neck stiffness. Respiratory: DAdmits to  SOB,  Cough  Cardiovascular: Denies chest pain, palpitations and leg swelling.  Gastrointestinal: See Hpi Genitourinary: Denies dysuria, urgency, frequency, hematuria, flank pain and difficulty urinating.    Musculoskeletal: Positive history of Gout. Neurological: Denies dizziness, seizures, syncope, weakness, light-headedness, numbness and headaches.    Physical Exam: Blood pressure 101/38, pulse 99, temperature 100 F (37.8 C), temperature source Oral, resp. rate 26, SpO2 98.00%. Constitutional: Vital signs reviewed.  Patient is a well-developed and well-nourished in no acute distress and cooperative with exam. Alert and oriented x3.  Head: Normocephalic and atraumatic Mouth: no erythema or exudates, MM dry Neck: Supple, Trachea midline normal ROM, No JVD, no neck stiffness Cardiovascular: RRR, S1 normal, S2 normal, no MRG, pulses symmetric and intact bilaterally Pulmonary/Chest: Bilateral Rhonchi Abdominal: Soft. Non-tender, non-distended, bowel sounds are normal, no masses, organomegaly, or guarding present.  Neurological: A&O x3, Strenght is normal and symmetric bilaterally, cranial nerve  II-XII are grossly intact, no focal motor deficit, sensory intact to light touch bilaterally.  Skin: Warm, dry and intact. No rash, cyanosis, or clubbing.  Psychiatric: Normal mood and affect. speech and behavior is normal. Judgment and thought content normal. Cognition and memory are normal.    Labs on Admission:  Results for orders placed during the hospital encounter of 05/02/11 (from the past 48 hour(s))  CBC     Status: Abnormal   Collection Time   05/02/11  8:11 AM      Component Value Range Comment   WBC 16.5 (*) 4.0 - 10.5 (K/uL)    RBC 3.60 (*) 4.22 - 5.81 (MIL/uL)    Hemoglobin 9.0 (*) 13.0 - 17.0 (g/dL)    HCT 16.1 (*) 09.6 - 52.0 (%)    MCV 83.3  78.0 - 100.0 (fL)    MCH 25.0 (*) 26.0 - 34.0 (pg)    MCHC 30.0  30.0 - 36.0 (g/dL)    RDW 04.5 (*) 40.9 - 15.5 (%)    Platelets 245  150 - 400 (K/uL)   DIFFERENTIAL     Status: Abnormal   Collection Time   05/02/11  8:11 AM      Component Value Range Comment   Neutrophils Relative 76  43 - 77 (%)    Neutro Abs 12.6 (*) 1.7 - 7.7 (K/uL)     Lymphocytes Relative 13  12 - 46 (%)    Lymphs Abs 2.2  0.7 - 4.0 (K/uL)    Monocytes Relative 7  3 - 12 (%)    Monocytes Absolute 1.2 (*) 0.1 - 1.0 (K/uL)    Eosinophils Relative 3  0 - 5 (%)    Eosinophils Absolute 0.5  0.0 - 0.7 (K/uL)    Basophils Relative 0  0 - 1 (%)    Basophils Absolute 0.0  0.0 - 0.1 (K/uL)   COMPREHENSIVE METABOLIC PANEL     Status: Abnormal   Collection Time   05/02/11  8:11 AM      Component Value Range Comment   Sodium 137  135 - 145 (mEq/L)    Potassium 4.5  3.5 - 5.1 (mEq/L)    Chloride 100  96 - 112 (mEq/L)    CO2 23  19 - 32 (mEq/L)    Glucose, Bld 127 (*) 70 - 99 (mg/dL)    BUN 81 (*) 6 - 23 (mg/dL)    Creatinine, Ser 8.11 (*) 0.50 - 1.35 (mg/dL)    Calcium 9.7  8.4 - 10.5 (mg/dL)    Total Protein 7.2  6.0 - 8.3 (g/dL)    Albumin 3.6  3.5 - 5.2 (g/dL)    AST 23  0 - 37 (U/L)    ALT 14  0 - 53 (U/L)    Alkaline Phosphatase 81  39 - 117 (U/L)    Total Bilirubin 0.3  0.3 - 1.2 (mg/dL)    GFR calc non Af Amer 21 (*) >90 (mL/min)    GFR calc Af Amer 24 (*) >90 (mL/min)   CK TOTAL AND CKMB     Status: Abnormal   Collection Time   05/02/11  8:12 AM      Component Value Range Comment   Total CK 268 (*) 7 - 232 (U/L)    CK, MB 3.0  0.3 - 4.0 (ng/mL)    Relative Index 1.1  0.0 - 2.5    TROPONIN I     Status: Normal   Collection Time   05/02/11  8:12 AM      Component Value Range Comment   Troponin I <0.30  <0.30 (ng/mL)   PRO B NATRIURETIC PEPTIDE     Status: Abnormal   Collection Time   05/02/11  8:12 AM      Component Value Range Comment   BNP, POC 898.7 (*) 0 - 450 (pg/mL)   URINALYSIS, ROUTINE W REFLEX MICROSCOPIC     Status: Normal   Collection Time   05/02/11 10:49 AM      Component Value Range Comment   Color, Urine YELLOW  YELLOW     APPearance CLEAR  CLEAR     Specific Gravity, Urine 1.015  1.005 - 1.030     pH 5.5  5.0 - 8.0     Glucose, UA NEGATIVE  NEGATIVE (mg/dL)    Hgb urine dipstick NEGATIVE  NEGATIVE     Bilirubin Urine  NEGATIVE  NEGATIVE     Ketones, ur NEGATIVE  NEGATIVE (mg/dL)    Protein, ur NEGATIVE  NEGATIVE (mg/dL)    Urobilinogen, UA 0.2  0.0 - 1.0 (mg/dL)    Nitrite NEGATIVE  NEGATIVE     Leukocytes, UA NEGATIVE  NEGATIVE  MICROSCOPIC NOT DONE ON URINES WITH NEGATIVE PROTEIN, BLOOD, LEUKOCYTES, NITRITE, OR GLUCOSE <1000 mg/dL.    Radiological Exams on Admission: DG Chest Portable 1 View [[id number]]      Order Status: Completed   Resulted: 05/02/11 0851   Narrative:   *RADIOLOGY REPORT*  Clinical Data: Fever and cough.  PORTABLE CHEST - 1 VIEW  Comparison: Chest 11/20/2010.  Findings: There is cardiomegaly but no pulmonary edema. No focal airspace disease or pleural effusion. No pneumothorax.  IMPRESSION: Cardiomegaly without acute disease.  Original Report Authenticated By: Bernadene Bell. Maricela Curet, M.D.        Assessment/Plan   Acute bronchitis  Patient has been coughing up phlegm which was greenish yellow in color over the past 3 days along with high fever of 104, the chest x-ray is negative for pneumonia but patient may be suffering from acute bronchitis. He has been started empirically on IV Avelox and will continue on that. We'll also start the patient on Prednisone. DM Patient has diabetes mellitus we'll continue her Lantus and add sliding scale insulin.  OBSTRUCTIVE SLEEP APNEA Patient uses BiPAP at home, will get respiratory therapy to continue BiPAP therapy while in the hospital.  CORONARY ARTERY DISEASE Patient has history of CAD with coronary stents, will continue him on aspirin , imdur Acute on kidney disease, unspecified Patient has CKD and his last BMP was on 1.6-2.19, today his creatinine is 2.72. Patient has not been eating and drinking well over the past 3 days and has continued to take the bumex and lisinopril. Patient will be started on gentle IV hydration and will follow the patient's BMP in morning.  BRONCHITIS, CHRONIC Patient will be continued on the albuterol  nebulizer along with ipratropium nebulizer treatments prn.  Diastolic CHF, chronic Will hold the patient's Bumex at this time due to the worsening of renal function. We'll continue to monitor the fluid status as he is on gentle IV hydration.  Gout: Patient will be  Continued on prednisone at home along with colchicine and allopurinol.     Time Spent on Admission: 75 min  LAMA,GAGAN S 05/02/2011, 11:43 AM

## 2011-05-03 ENCOUNTER — Encounter (HOSPITAL_COMMUNITY): Payer: Self-pay | Admitting: Internal Medicine

## 2011-05-03 DIAGNOSIS — R509 Fever, unspecified: Secondary | ICD-10-CM

## 2011-05-03 LAB — GLUCOSE, CAPILLARY
Glucose-Capillary: 144 mg/dL — ABNORMAL HIGH (ref 70–99)
Glucose-Capillary: 159 mg/dL — ABNORMAL HIGH (ref 70–99)

## 2011-05-03 LAB — BASIC METABOLIC PANEL
BUN: 84 mg/dL — ABNORMAL HIGH (ref 6–23)
Creatinine, Ser: 3.56 mg/dL — ABNORMAL HIGH (ref 0.50–1.35)
GFR calc non Af Amer: 15 mL/min — ABNORMAL LOW (ref >90)
Glucose, Bld: 121 mg/dL — ABNORMAL HIGH (ref 70–99)
Potassium: 4.9 mEq/L (ref 3.5–5.1)

## 2011-05-03 LAB — CBC
HCT: 26.5 % — ABNORMAL LOW (ref 39.0–52.0)
Hemoglobin: 7.9 g/dL — ABNORMAL LOW (ref 13.0–17.0)
MCH: 24.9 pg — ABNORMAL LOW (ref 26.0–34.0)
MCHC: 29.8 g/dL — ABNORMAL LOW (ref 30.0–36.0)
MCV: 83.6 fL (ref 78.0–100.0)
RDW: 18.5 % — ABNORMAL HIGH (ref 11.5–15.5)

## 2011-05-03 MED ORDER — MOXIFLOXACIN HCL IN NACL 400 MG/250ML IV SOLN
400.0000 mg | INTRAVENOUS | Status: DC
  Administered 2011-05-03 – 2011-05-07 (×5): 400 mg via INTRAVENOUS
  Filled 2011-05-03 (×8): qty 250

## 2011-05-03 MED ORDER — MAGIC MOUTHWASH
10.0000 mL | Freq: Two times a day (BID) | ORAL | Status: DC | PRN
  Administered 2011-05-03 – 2011-05-05 (×3): 10 mL via ORAL
  Filled 2011-05-03 (×2): qty 10

## 2011-05-03 MED ORDER — SODIUM CHLORIDE 0.9 % IV SOLN
INTRAVENOUS | Status: AC
Start: 2011-05-03 — End: 2011-05-04
  Administered 2011-05-03 – 2011-05-04 (×2): via INTRAVENOUS

## 2011-05-03 MED ORDER — VANCOMYCIN HCL 1000 MG IV SOLR
1500.0000 mg | INTRAVENOUS | Status: DC
  Administered 2011-05-03 – 2011-05-05 (×2): 1500 mg via INTRAVENOUS
  Filled 2011-05-03 (×2): qty 1500

## 2011-05-03 MED ORDER — AMOXICILLIN-POT CLAVULANATE 875-125 MG PO TABS: 1.0000 | ORAL_TABLET | Freq: Two times a day (BID) | ORAL | Status: DC

## 2011-05-03 MED ORDER — ACETAMINOPHEN 325 MG PO TABS
650.0000 mg | ORAL_TABLET | Freq: Four times a day (QID) | ORAL | Status: DC | PRN
  Administered 2011-05-03 – 2011-05-09 (×10): 650 mg via ORAL
  Filled 2011-05-03 (×10): qty 2

## 2011-05-03 NOTE — Progress Notes (Signed)
CRITICAL VALUE ALERT  Critical value received:  Gram + cocci in clusters in 1 set of blood cx (anaerobic bottle)  Date of notification:  05/03/11  Time of notification:  0640  Critical value read back:yes  Nurse who received alert:  Verdene Lennert, RN  MD notified (1st page):  Benedetto Coons, NP  Time of first page:  [phone]  MD notified (2nd page):  Time of second page:  Responding MD:  Benedetto Coons, NP  Time MD responded:  [phone]

## 2011-05-03 NOTE — Progress Notes (Signed)
ANTIBIOTIC CONSULT NOTE - INITIAL  Pharmacy Consult for Vancomycin  Indication: Empiric coverage of r/o Bacteremia  Allergies  Allergen Reactions  . Cefuroxime Other (See Comments)    REACTION: unkown reaction  . Donepezil Diarrhea  . Heparin Itching  . Hydralazine Other (See Comments)    DO NOT TAKE PER MD  . Hydromorphone Itching  . Metoprolol Other (See Comments)    unknown  . Morphine Itching    REACTION: itching  . Plavix (Clopidogrel Bisulfate) Other (See Comments)    GI BLEEDING  . Promethazine Other (See Comments)    DO NOT TAKE PER MD  . Sulfonamide Derivatives Itching  . Zaroxolyn (Metolazone) Diarrhea  . Ertapenem Itching and Rash    Patient Measurements: Height: 5\' 8"  (172.7 cm) Weight: 244 lb 11.4 oz (111 kg) IBW/kg (Calculated) : 68.4    Vital Signs: Temp: 99.3 F (37.4 C) (12/09 0520) Temp src: Oral (12/09 0520) BP: 144/64 mmHg (12/09 0520) Pulse Rate: 79  (12/09 0520) Intake/Output from previous day: 12/08 0701 - 12/09 0700 In: 1537.5 [P.O.:480; I.V.:807.5; IV Piggyback:250] Out: 825 [Urine:825] Intake/Output from this shift: Total I/O In: 1020 [P.O.:240; I.V.:780] Out: 825 [Urine:825]  Labs:  [facility] 05/03/11 0510 05/02/11 0811  WBC 20.4* 16.5*  HGB 7.9* 9.0*  PLT 213 245  LABCREA -- --  CREATININE 3.56* 2.76*   Estimated Creatinine Clearance: 21.7 ml/min (by C-G formula based on Cr of 3.56). No results found for this basename: VANCOTROUGH:2,VANCOPEAK:2,VANCORANDOM:2,GENTTROUGH:2,GENTPEAK:2,GENTRANDOM:2,TOBRATROUGH:2,TOBRAPEAK:2,TOBRARND:2,AMIKACINPEAK:2,AMIKACINTROU:2,AMIKACIN:2, in the last 72 hours   Microbiology: Recent Results (from the past 720 hour(s))  CULTURE, BLOOD (ROUTINE X 2)     Status: Normal (Preliminary result)   Collection Time   05/02/11  7:55 AM      Component Value Range Status Comment   Specimen Description BLOOD RIGHT ARM   Final    Special Requests BOTTLES DRAWN AEROBIC AND ANAEROBIC [facility]   Final    Setup  Time [id number]   Final    Culture     Final    Value: GRAM POSITIVE COCCI IN CLUSTERS     Note: Gram Stain Report Called to,Read Back By and Verified With: Teryl Lucy RN on 05/03/11 at 06:35 by Christie Nottingham   Report Status PENDING   Incomplete   MRSA PCR SCREENING     Status: Abnormal   Collection Time   05/02/11  5:50 PM      Component Value Range Status Comment   MRSA by PCR POSITIVE (*) NEGATIVE  Final     Medical History: Past Medical History  Diagnosis Date  . Adenomatous colon polyp   . Diverticulosis   . GERD (gastroesophageal reflux disease)   . Hiatal hernia   . Alzheimer disease   . Gout   . Renal insufficiency   . DM (diabetes mellitus)   . Asthma with bronchitis   . COPD (chronic obstructive pulmonary disease)   . Chronic hypoxemic respiratory failure   . OSA (obstructive sleep apnea)   . CHF (congestive heart failure)   . Blood transfusion   . Arthritis   . Myocardial infarction   . Pneumonia   . Anemia     Medications:  Scheduled:    . sodium chloride   Intravenous STAT  . acetaminophen  1,000 mg Oral Once  . allopurinol  300 mg Oral Daily  . aspirin  325 mg Oral Daily  . Chlorhexidine Gluconate Cloth  6 each Topical Q0600  . colchicine  0.6 mg Oral Daily  . diclofenac  1 drop Both Eyes BID  . escitalopram  10 mg Oral Daily  . ezetimibe  10 mg Oral Daily  . folic acid  0.5 mg Oral Daily  . gabapentin  100 mg Oral QHS  . insulin aspart  0-15 Units Subcutaneous TID WC  . insulin glargine  53 Units Subcutaneous QAC breakfast  . insulin glargine  56 Units Subcutaneous Q24H  . ipratropium  0.5 mg Nebulization Q4H  . isosorbide mononitrate  30 mg Oral Daily  . moxifloxacin  400 mg Intravenous Once  . moxifloxacin  400 mg Intravenous Q24H  . mupirocin ointment  1 application Nasal BID  . predniSONE  5 mg Oral Q breakfast  . simvastatin  40 mg Oral Daily  . DISCONTD: sodium chloride   Intravenous Once  . DISCONTD: folic acid  400 mcg Oral Daily     Infusions:   Assessment: 77 year old man admitted with acute bronchitis on 05/02/11 and IV Avelox started.  Blood culture shows GPC and IV Vancomycin to be added onto regimen.  CrCl (n) = 38ml/min  Goal of Therapy:  Vancomycin trough level 15-20 mcg/ml  Plan:  1.  Vancomycin 1500mg  IV q48h 2.  Follow renal function and check vancomycin trough level as needed.  [name], [name] 05/03/2011,6:47 AM

## 2011-05-03 NOTE — Consult Note (Signed)
Infectious Diseases Initial Consultation                Day 2 Levaquin         Date of Admission:  05/02/2011  Date of Consult:  05/03/2011  Reason for Consult: Fever and possible bacteremia Referring Physician: Dr. Mauro Kaufmann   Problem List:  Active Problems:  Fever  DM  OBSTRUCTIVE SLEEP APNEA  CORONARY ARTERY DISEASE  Chronic kidney disease, unspecified  BRONCHITIS, CHRONIC  Acute bronchitis  Diastolic CHF, chronic   Recommendations: 1. Continue Levaquin and start IV vancomycin per pharmacy protocol. 2. Check pending blood cultures tomorrow. 3. PA and lateral chest x-ray in the morning.   Assessment: [name] has several possible sources of fever. One of 2 blood cultures done in the emergency department is growing gram-positive cocci in clusters. This could be an insignificant contaminant but I will add IV vancomycin for the possibility of bacteremia. His MRSA nasal PCR screening is positive. He may also have left otitis media. His severe cough is unchanged and his admission portable chest x-ray did not show any obvious pneumonia but was poor quality. I will continue Levaquin for probable respiratory infection and obtain a two-view chest x-ray in the morning.    HPI: [name] is a 77 y.o. male with chronic bronchitis and sleep apnea who began having fever and rigors 4 days ago. He also noted some decreased hearing in his left ear. He has severe chronic cough productive of yellow-green sputum but this has not changed recently. He and his wife have also noticed some jerking of his arms and legs over the last several days. He has chronic intermittent diarrhea ever since his cholecystectomy but this has not changed. He did have some nausea yesterday with dry heaves but no vomiting. He has not had any dysuria. He has not noted any change in his oxygen saturation at home and has been using his same low-flow oxygen supplementation at night along with his CPAP mask. However he  has felt more short of breath recently.   Review of Systems: Constitutional: positive for anorexia, chills, fatigue and fevers, negative for sweats and weight loss Ears, nose, mouth, throat, and face: positive for hearing loss, hoarseness and voice change, negative for ear drainage, earaches, nasal congestion and sore throat, or change in snoring pattern Respiratory: positive for chronic bronchitis, cough, dyspnea on exertion, emphysema and sputum, negative for hemoptysis Cardiovascular: positive for dyspnea, near-syncope and When coughing, negative for chest pain, chest pressure/discomfort and irregular heart beat Gastrointestinal: positive for Intermittent bright red blood per rectum or, negative for abdominal pain, change in bowel habits and constipation Genitourinary:negative for dysuria Neurological: positive for Intermittent jerking of his arms and legs     . sodium chloride   Intravenous STAT  . sodium chloride   Intravenous STAT  . acetaminophen  1,000 mg Oral Once  . allopurinol  300 mg Oral Daily  . aspirin  325 mg Oral Daily  . Chlorhexidine Gluconate Cloth  6 each Topical Q0600  . colchicine  0.6 mg Oral Daily  . diclofenac  1 drop Both Eyes BID  . escitalopram  10 mg Oral Daily  . ezetimibe  10 mg Oral Daily  . folic acid  0.5 mg Oral Daily  . gabapentin  100 mg Oral QHS  . insulin aspart  0-15 Units Subcutaneous TID WC  . insulin glargine  53 Units Subcutaneous QAC breakfast  . insulin glargine  56 Units Subcutaneous Q24H  .  ipratropium  0.5 mg Nebulization Q4H  . isosorbide mononitrate  30 mg Oral Daily  . moxifloxacin  400 mg Intravenous Q24H  . mupirocin ointment  1 application Nasal BID  . predniSONE  5 mg Oral Q breakfast  . simvastatin  40 mg Oral Daily  . vancomycin  1,500 mg Intravenous Q48H  . DISCONTD: sodium chloride   Intravenous Once  . DISCONTD: amoxicillin-clavulanate  1 tablet Oral Q12H  . DISCONTD: folic acid  400 mcg Oral Daily  . DISCONTD:  moxifloxacin  400 mg Intravenous Q24H    Past Medical History  Diagnosis Date  . Adenomatous colon polyp   . Diverticulosis   . GERD (gastroesophageal reflux disease)   . Hiatal hernia   . Alzheimer disease   . Gout   . Renal insufficiency   . DM (diabetes mellitus)   . Asthma with bronchitis   . COPD (chronic obstructive pulmonary disease)   . Chronic hypoxemic respiratory failure   . OSA (obstructive sleep apnea)   . CHF (congestive heart failure)   . Blood transfusion   . Arthritis   . Myocardial infarction   . Pneumonia   . Anemia     History  Substance Use Topics  . Smoking status: Former Games developer  . Smokeless tobacco: Former Neurosurgeon    Quit date: 06/01/1990  . Alcohol Use: No    Family History  Problem Relation Age of Onset  . Melanoma Mother   . Lung cancer Brother   . Diabetes    . Heart disease    . Stomach cancer      grandfather   Allergies  Allergen Reactions  . Cefuroxime Other (See Comments)    REACTION: unkown reaction  . Donepezil Diarrhea  . Heparin Itching  . Hydralazine Other (See Comments)    DO NOT TAKE PER MD  . Hydromorphone Itching  . Metoprolol Other (See Comments)    unknown  . Morphine Itching    REACTION: itching  . Plavix (Clopidogrel Bisulfate) Other (See Comments)    GI BLEEDING  . Promethazine Other (See Comments)    DO NOT TAKE PER MD  . Sulfonamide Derivatives Itching  . Zaroxolyn (Metolazone) Diarrhea  . Ertapenem Itching and Rash    OBJECTIVE: Blood pressure 144/64, pulse 79, temperature 99.3 F (37.4 C), temperature source Oral, resp. rate 20, height 5\' 8"  (1.727 m), weight 111 kg (244 lb 11.4 oz), SpO2 95.00%. General: He is obese. He has periods where he is coughing uncontrollably. He is alert otherwise comfortable Skin: No rash Lungs: Clear without any wheezing or crackles. Cor: Very distant heart sounds. Abdomen: Obese but soft and nontender. He does have some intermittent myoclonic jerks.   Results for  orders placed during the hospital encounter of 05/02/11 (from the past 48 hour(s))  CULTURE, BLOOD (ROUTINE X 2)     Status: Normal (Preliminary result)   Collection Time   05/02/11  7:55 AM      Component Value Range Comment   Specimen Description BLOOD RIGHT ARM      Special Requests BOTTLES DRAWN AEROBIC AND ANAEROBIC 5CC EACH      Setup Time [id number]      Culture        Value: GRAM POSITIVE COCCI IN CLUSTERS     Note: Gram Stain Report Called to,Read Back By and Verified With: Teryl Lucy RN on 05/03/11 at 06:35 by Christie Nottingham   Report Status PENDING     CULTURE, BLOOD (ROUTINE X 2)  Status: Normal (Preliminary result)   Collection Time   05/02/11  8:06 AM      Component Value Range Comment   Specimen Description BLOOD LEFT HAND      Special Requests BOTTLES DRAWN AEROBIC AND ANAEROBIC 5CC EACH      Setup Time [id number]      Culture        Value:        BLOOD CULTURE RECEIVED NO GROWTH TO DATE CULTURE WILL BE HELD FOR 5 DAYS BEFORE ISSUING A FINAL NEGATIVE REPORT   Report Status PENDING     CBC     Status: Abnormal   Collection Time   05/02/11  8:11 AM      Component Value Range Comment   WBC 16.5 (*) 4.0 - 10.5 (K/uL)    RBC 3.60 (*) 4.22 - 5.81 (MIL/uL)    Hemoglobin 9.0 (*) 13.0 - 17.0 (g/dL)    HCT 81.1 (*) 91.4 - 52.0 (%)    MCV 83.3  78.0 - 100.0 (fL)    MCH 25.0 (*) 26.0 - 34.0 (pg)    MCHC 30.0  30.0 - 36.0 (g/dL)    RDW 78.2 (*) 95.6 - 15.5 (%)    Platelets 245  150 - 400 (K/uL)   DIFFERENTIAL     Status: Abnormal   Collection Time   05/02/11  8:11 AM      Component Value Range Comment   Neutrophils Relative 76  43 - 77 (%)    Neutro Abs 12.6 (*) 1.7 - 7.7 (K/uL)    Lymphocytes Relative 13  12 - 46 (%)    Lymphs Abs 2.2  0.7 - 4.0 (K/uL)    Monocytes Relative 7  3 - 12 (%)    Monocytes Absolute 1.2 (*) 0.1 - 1.0 (K/uL)    Eosinophils Relative 3  0 - 5 (%)    Eosinophils Absolute 0.5  0.0 - 0.7 (K/uL)    Basophils Relative 0  0 - 1 (%)    Basophils  Absolute 0.0  0.0 - 0.1 (K/uL)   COMPREHENSIVE METABOLIC PANEL     Status: Abnormal   Collection Time   05/02/11  8:11 AM      Component Value Range Comment   Sodium 137  135 - 145 (mEq/L)    Potassium 4.5  3.5 - 5.1 (mEq/L)    Chloride 100  96 - 112 (mEq/L)    CO2 23  19 - 32 (mEq/L)    Glucose, Bld 127 (*) 70 - 99 (mg/dL)    BUN 81 (*) 6 - 23 (mg/dL)    Creatinine, Ser 2.13 (*) 0.50 - 1.35 (mg/dL)    Calcium 9.7  8.4 - 10.5 (mg/dL)    Total Protein 7.2  6.0 - 8.3 (g/dL)    Albumin 3.6  3.5 - 5.2 (g/dL)    AST 23  0 - 37 (U/L)    ALT 14  0 - 53 (U/L)    Alkaline Phosphatase 81  39 - 117 (U/L)    Total Bilirubin 0.3  0.3 - 1.2 (mg/dL)    GFR calc non Af Amer 21 (*) >90 (mL/min)    GFR calc Af Amer 24 (*) >90 (mL/min)   CK TOTAL AND CKMB     Status: Abnormal   Collection Time   05/02/11  8:12 AM      Component Value Range Comment   Total CK 268 (*) 7 - 232 (U/L)    CK, MB 3.0  0.3 - 4.0 (ng/mL)    Relative Index 1.1  0.0 - 2.5    TROPONIN I     Status: Normal   Collection Time   05/02/11  8:12 AM      Component Value Range Comment   Troponin I <0.30  <0.30 (ng/mL)   PRO B NATRIURETIC PEPTIDE     Status: Abnormal   Collection Time   05/02/11  8:12 AM      Component Value Range Comment   BNP, POC 898.7 (*) 0 - 450 (pg/mL)   INFLUENZA PANEL BY PCR     Status: Normal   Collection Time   05/02/11 10:36 AM      Component Value Range Comment   Influenza A By PCR NEGATIVE  NEGATIVE     Influenza B By PCR NEGATIVE  NEGATIVE     H1N1 flu by pcr NOT DETECTED  NOT DETECTED    URINALYSIS, ROUTINE W REFLEX MICROSCOPIC     Status: Normal   Collection Time   05/02/11 10:49 AM      Component Value Range Comment   Color, Urine YELLOW  YELLOW     APPearance CLEAR  CLEAR     Specific Gravity, Urine 1.015  1.005 - 1.030     pH 5.5  5.0 - 8.0     Glucose, UA NEGATIVE  NEGATIVE (mg/dL)    Hgb urine dipstick NEGATIVE  NEGATIVE     Bilirubin Urine NEGATIVE  NEGATIVE     Ketones, ur NEGATIVE   NEGATIVE (mg/dL)    Protein, ur NEGATIVE  NEGATIVE (mg/dL)    Urobilinogen, UA 0.2  0.0 - 1.0 (mg/dL)    Nitrite NEGATIVE  NEGATIVE     Leukocytes, UA NEGATIVE  NEGATIVE  MICROSCOPIC NOT DONE ON URINES WITH NEGATIVE PROTEIN, BLOOD, LEUKOCYTES, NITRITE, OR GLUCOSE <1000 mg/dL.  GLUCOSE, CAPILLARY     Status: Abnormal   Collection Time   05/02/11  2:21 PM      Component Value Range Comment   Glucose-Capillary 123 (*) 70 - 99 (mg/dL)   PROCALCITONIN     Status: Normal   Collection Time   05/02/11  4:18 PM      Component Value Range Comment   Procalcitonin 8.10     LACTIC ACID, PLASMA     Status: Normal   Collection Time   05/02/11  4:18 PM      Component Value Range Comment   Lactic Acid, Venous 1.4  0.5 - 2.2 (mmol/L)   MRSA PCR SCREENING     Status: Abnormal   Collection Time   05/02/11  5:50 PM      Component Value Range Comment   MRSA by PCR POSITIVE (*) NEGATIVE    GLUCOSE, CAPILLARY     Status: Abnormal   Collection Time   05/02/11  9:30 PM      Component Value Range Comment   Glucose-Capillary 140 (*) 70 - 99 (mg/dL)    Comment 1 Notify RN      Comment 2 Documented in Chart     CBC     Status: Abnormal   Collection Time   05/03/11  5:10 AM      Component Value Range Comment   WBC 20.4 (*) 4.0 - 10.5 (K/uL)    RBC 3.17 (*) 4.22 - 5.81 (MIL/uL)    Hemoglobin 7.9 (*) 13.0 - 17.0 (g/dL)    HCT 04.5 (*) 40.9 - 52.0 (%)    MCV 83.6  78.0 - 100.0 (  fL)    MCH 24.9 (*) 26.0 - 34.0 (pg)    MCHC 29.8 (*) 30.0 - 36.0 (g/dL)    RDW 16.1 (*) 09.6 - 15.5 (%)    Platelets 213  150 - 400 (K/uL)   BASIC METABOLIC PANEL     Status: Abnormal   Collection Time   05/03/11  5:10 AM      Component Value Range Comment   Sodium 139  135 - 145 (mEq/L)    Potassium 4.9  3.5 - 5.1 (mEq/L)    Chloride 104  96 - 112 (mEq/L)    CO2 25  19 - 32 (mEq/L)    Glucose, Bld 121 (*) 70 - 99 (mg/dL)    BUN 84 (*) 6 - 23 (mg/dL)    Creatinine, Ser 0.45 (*) 0.50 - 1.35 (mg/dL)    Calcium 9.4  8.4 - 10.5  (mg/dL)    GFR calc non Af Amer 15 (*) >90 (mL/min)    GFR calc Af Amer 18 (*) >90 (mL/min)   GLUCOSE, CAPILLARY     Status: Abnormal   Collection Time   05/03/11  7:57 AM      Component Value Range Comment   Glucose-Capillary 147 (*) 70 - 99 (mg/dL)       Component Value Date/Time   SDES BLOOD LEFT HAND 05/02/2011 0806   SPECREQUEST BOTTLES DRAWN AEROBIC AND ANAEROBIC 5CC EACH 05/02/2011 0806   CULT        BLOOD CULTURE RECEIVED NO GROWTH TO DATE CULTURE WILL BE HELD FOR 5 DAYS BEFORE ISSUING A FINAL NEGATIVE REPORT 05/02/2011 0806   REPTSTATUS PENDING 05/02/2011 0806   Dg Chest Portable 1 View  05/02/2011  *RADIOLOGY REPORT*  Clinical Data: Fever and cough.  PORTABLE CHEST - 1 VIEW  Comparison: Chest 11/20/2010.  Findings: There is cardiomegaly but no pulmonary edema.  No focal airspace disease or pleural effusion.  No pneumothorax.  IMPRESSION: Cardiomegaly without acute disease.  Original Report Authenticated By: Bernadene Bell. Maricela Curet, M.D.    Cliffton Asters, MD [facility] [phone] pager   [phone] cell 05/03/2011, 11:58 AM

## 2011-05-03 NOTE — Progress Notes (Addendum)
Subjective: Patient seen and examined, looks much better today. More alert, communicating better. BP is stable, complains of left earache.   Objective: Vital signs in last 24 hours: Temp:  [98.1 F (36.7 C)-102.7 F (39.3 C)] 99.3 F (37.4 C) (12/09 0520) Pulse Rate:  [71-114] 79  (12/09 0520) Resp:  [18-31] 20  (12/09 0520) BP: (91-144)/(34-64) 144/64 mmHg (12/09 0520) SpO2:  [93 %-100 %] 96 % (12/09 0520) Weight:  [111 kg (244 lb 11.4 oz)-114.4 kg (252 lb 3.3 oz)] 244 lb 11.4 oz (111 kg) (12/09 0520) Weight change:  Last BM Date: 05/01/11  Intake/Output from previous day: 12/08 0701 - 12/09 0700 In: 1537.5 [P.O.:480; I.V.:807.5; IV Piggyback:250] Out: 825 [Urine:825]     Physical Exam: General: Alert, awake, oriented x3, in no acute distress. HEENT: Positive erythema on TM in left ear Heart: Regular rate and rhythm, without murmurs, rubs, gallops. Lungs: Clear to auscultation bilaterally. Abdomen: Soft, nontender, nondistended, positive bowel sounds. Extremities: No clubbing cyanosis or edema with positive pedal pulses. Neuro: Grossly intact, nonfocal.    Lab Results: Basic Metabolic Panel:  Basename 05/03/11 0510 05/02/11 0811  NA 139 137  K 4.9 4.5  CL 104 100  CO2 25 23  GLUCOSE 121* 127*  BUN 84* 81*  CREATININE 3.56* 2.76*  CALCIUM 9.4 9.7  MG -- --  PHOS -- --   Liver Function Tests:  Basename 05/02/11 0811  AST 23  ALT 14  ALKPHOS 81  BILITOT 0.3  PROT 7.2  ALBUMIN 3.6   No results found for this basename: LIPASE:2,AMYLASE:2 in the last 72 hours No results found for this basename: AMMONIA:2 in the last 72 hours CBC:  Basename 05/03/11 0510 05/02/11 0811  WBC 20.4* 16.5*  NEUTROABS -- 12.6*  HGB 7.9* 9.0*  HCT 26.5* 30.0*  MCV 83.6 83.3  PLT 213 245   Cardiac Enzymes:  Basename 05/02/11 0812  CKTOTAL 268*  CKMB 3.0  CKMBINDEX --  TROPONINI <0.30   BNP:  Basename 05/02/11 0812  POCBNP 898.7*   D-Dimer: No results found for  this basename: DDIMER:2 in the last 72 hours CBG:  Basename 05/03/11 0757 05/02/11 2130 05/02/11 1421  GLUCAP 147* 140* 123*    Misc. Labs:  Recent Results (from the past 240 hour(s))  CULTURE, BLOOD (ROUTINE X 2)     Status: Normal (Preliminary result)   Collection Time   05/02/11  7:55 AM      Component Value Range Status Comment   Specimen Description BLOOD RIGHT ARM   Final    Special Requests BOTTLES DRAWN AEROBIC AND ANAEROBIC [facility]   Final    Setup Time [id number]   Final    Culture     Final    Value: GRAM POSITIVE COCCI IN CLUSTERS     Note: Gram Stain Report Called to,Read Back By and Verified With: Teryl Lucy RN on 05/03/11 at 06:35 by Christie Nottingham   Report Status PENDING   Incomplete   MRSA PCR SCREENING     Status: Abnormal   Collection Time   05/02/11  5:50 PM      Component Value Range Status Comment   MRSA by PCR POSITIVE (*) NEGATIVE  Final     Studies/Results: Dg Chest Portable 1 View  05/02/2011  *RADIOLOGY REPORT*  Clinical Data: Fever and cough.  PORTABLE CHEST - 1 VIEW  Comparison: Chest 11/20/2010.  Findings: There is cardiomegaly but no pulmonary edema.  No focal airspace disease or pleural effusion.  No  pneumothorax.  IMPRESSION: Cardiomegaly without acute disease.  Original Report Authenticated By: Bernadene Bell. Maricela Curet, M.D.    Medications: Scheduled Meds:   . sodium chloride   Intravenous STAT  . acetaminophen  1,000 mg Oral Once  . allopurinol  300 mg Oral Daily  . amoxicillin-clavulanate  1 tablet Oral Q12H  . aspirin  325 mg Oral Daily  . Chlorhexidine Gluconate Cloth  6 each Topical Q0600  . colchicine  0.6 mg Oral Daily  . diclofenac  1 drop Both Eyes BID  . escitalopram  10 mg Oral Daily  . ezetimibe  10 mg Oral Daily  . folic acid  0.5 mg Oral Daily  . gabapentin  100 mg Oral QHS  . insulin aspart  0-15 Units Subcutaneous TID WC  . insulin glargine  53 Units Subcutaneous QAC breakfast  . insulin glargine  56 Units Subcutaneous  Q24H  . ipratropium  0.5 mg Nebulization Q4H  . isosorbide mononitrate  30 mg Oral Daily  . moxifloxacin  400 mg Intravenous Once  . mupirocin ointment  1 application Nasal BID  . predniSONE  5 mg Oral Q breakfast  . simvastatin  40 mg Oral Daily  . vancomycin  1,500 mg Intravenous Q48H  . DISCONTD: sodium chloride   Intravenous Once  . DISCONTD: folic acid  400 mcg Oral Daily  . DISCONTD: moxifloxacin  400 mg Intravenous Q24H   Continuous Infusions:  PRN Meds:.albuterol, nitroGLYCERIN  Assessment/Plan:  Active Problems:  Bacteremia: ? Source. GPC in clusters in blood cultures x 2 He is currently on Avelox,. ID Consulted for further recommendations.  Acute on CKD: BUN/Cr is worse. Will continue to hydrate and follow BMP in am.   Diabetes Mellitus Glucose well controlled Continue Lantus BID Continue SSI.   OBSTRUCTIVE SLEEP APNEA Cont Bipap.   CORONARY ARTERY DISEASE Cont Aspirin, zetia, isosorbide.   BRONCHITIS, CHRONIC Cont Nebs Prn.   Acute bronchitis Cont avelox  Otitis media Cont avelox.   Diastolic CHF, chronic Will cont to hold diuretics at this time due to worsening renal function.  Anemia: ? Dilutional Check stool for occult blood.  Will restart avelox, and d/c augmentin.  D/W Dr Orvan Falconer, who will see the patient.    LOS: 1 day   [facility] Pager: [phone] 05/03/2011, 8:33 AM

## 2011-05-04 ENCOUNTER — Inpatient Hospital Stay (HOSPITAL_COMMUNITY): Payer: BC Managed Care – PPO

## 2011-05-04 LAB — CBC
MCH: 24.8 pg — ABNORMAL LOW (ref 26.0–34.0)
MCHC: 29.9 g/dL — ABNORMAL LOW (ref 30.0–36.0)
Platelets: 189 10*3/uL (ref 150–400)
RDW: 18.6 % — ABNORMAL HIGH (ref 11.5–15.5)

## 2011-05-04 LAB — GLUCOSE, CAPILLARY
Glucose-Capillary: 148 mg/dL — ABNORMAL HIGH (ref 70–99)
Glucose-Capillary: 152 mg/dL — ABNORMAL HIGH (ref 70–99)
Glucose-Capillary: 141 mg/dL — ABNORMAL HIGH (ref 70–99)

## 2011-05-04 LAB — URINE CULTURE

## 2011-05-04 LAB — BASIC METABOLIC PANEL
Calcium: 9.1 mg/dL (ref 8.4–10.5)
GFR calc non Af Amer: 24 mL/min — ABNORMAL LOW (ref >90)
Sodium: 137 mEq/L (ref 135–145)

## 2011-05-04 LAB — CULTURE, BLOOD (ROUTINE X 2): Culture  Setup Time: 201212081319

## 2011-05-04 LAB — PREPARE RBC (CROSSMATCH)

## 2011-05-04 MED ORDER — FUROSEMIDE 10 MG/ML IJ SOLN
10.0000 mg | Freq: Once | INTRAMUSCULAR | Status: AC
  Administered 2011-05-04: 10 mg via INTRAVENOUS
  Filled 2011-05-04: qty 1

## 2011-05-04 MED ORDER — POLYETHYLENE GLYCOL 3350 17 G PO PACK
17.0000 g | PACK | Freq: Once | ORAL | Status: AC
  Administered 2011-05-04: 17 g via ORAL
  Filled 2011-05-04: qty 1

## 2011-05-04 MED ORDER — FUROSEMIDE 10 MG/ML IJ SOLN
10.0000 mg | Freq: Once | INTRAMUSCULAR | Status: DC
  Filled 2011-05-04: qty 1

## 2011-05-04 NOTE — Progress Notes (Signed)
UR CHART REVIEWED; B Rylan Kaufmann RN, BSN, MHA 

## 2011-05-04 NOTE — Progress Notes (Signed)
Subjective: Patient seen and examined, H&H dropped, received one unit PRBC this am. He has guaic positive stools.   Objective: Vital signs in last 24 hours: Temp:  [98.1 F (36.7 C)-99.1 F (37.3 C)] 98.8 F (37.1 C) (12/10 1314) Pulse Rate:  [69-85] 69  (12/10 1314) Resp:  [20-26] 20  (12/10 1314) BP: (108-162)/(54-68) 108/57 mmHg (12/10 1314) SpO2:  [94 %-98 %] 97 % (12/10 1508) Weight:  [111.8 kg (246 lb 7.6 oz)] 246 lb 7.6 oz (111.8 kg) (12/10 1610) Weight change: -2.6 kg (-5 lb 11.7 oz) Last BM Date: 05/04/11  Intake/Output from previous day: 12/09 0701 - 12/10 0700 In: 120 [P.O.:120] Out: 1800 [Urine:1800] Total I/O In: 1162.5 [I.V.:600; Blood:312.5; IV Piggyback:250] Out: 625 [Urine:625]   Physical Exam: General: Alert, awake, oriented x3, in no acute distress. HEENT: Positive erythema on TM in left ear Heart: Regular rate and rhythm, without murmurs, rubs, gallops. Lungs: Clear to auscultation bilaterally. Abdomen: Soft, nontender, nondistended, positive bowel sounds. Extremities: No clubbing cyanosis or edema with positive pedal pulses. Neuro: Grossly intact, nonfocal.    Lab Results: Basic Metabolic Panel:  Basename 05/04/11 0452 05/03/11 0510  NA 137 139  K 4.9 4.9  CL 106 104  CO2 22 25  GLUCOSE 231* 121*  BUN 69* 84*  CREATININE 2.45* 3.56*  CALCIUM 9.1 9.4  MG -- --  PHOS -- --   Liver Function Tests:  Basename 05/02/11 0811  AST 23  ALT 14  ALKPHOS 81  BILITOT 0.3  PROT 7.2  ALBUMIN 3.6   No results found for this basename: LIPASE:2,AMYLASE:2 in the last 72 hours No results found for this basename: AMMONIA:2 in the last 72 hours CBC:  Basename 05/04/11 0452 05/03/11 0510 05/02/11 0811  WBC 11.9* 20.4* --  NEUTROABS -- -- 12.6*  HGB 6.7* 7.9* --  HCT 22.4* 26.5* --  MCV 83.0 83.6 --  PLT 189 213 --   Cardiac Enzymes:  Basename 05/02/11 0812  CKTOTAL 268*  CKMB 3.0  CKMBINDEX --  TROPONINI <0.30   BNP: No components found  with this basename: POCBNP:3 D-Dimer: No results found for this basename: DDIMER:2 in the last 72 hours CBG:  Basename 05/04/11 1115 05/04/11 0753 05/03/11 2013 05/03/11 1658 05/03/11 1203 05/03/11 0757  GLUCAP 152* 148* 159* 144* 173* 147*    Misc. Labs:  Recent Results (from the past 240 hour(s))  CULTURE, BLOOD (ROUTINE X 2)     Status: Normal   Collection Time   05/02/11  7:55 AM      Component Value Range Status Comment   Specimen Description BLOOD RIGHT ARM   Final    Special Requests BOTTLES DRAWN AEROBIC AND ANAEROBIC 5CC EACH   Final    Setup Time [id number]   Final    Culture     Final    Value: STAPHYLOCOCCUS SPECIES (COAGULASE NEGATIVE)     Note: THE SIGNIFICANCE OF ISOLATING THIS ORGANISM FROM A SINGLE SET OF BLOOD CULTURES WHEN MULTIPLE SETS ARE DRAWN IS UNCERTAIN. PLEASE NOTIFY THE MICROBIOLOGY DEPARTMENT WITHIN ONE WEEK IF SPECIATION AND SENSITIVITIES ARE REQUIRED.     Note: Gram Stain Report Called to,Read Back By and Verified With: Teryl Lucy RN on 05/03/11 at 06:35 by Christie Nottingham   Report Status 05/04/2011 FINAL   Final   CULTURE, BLOOD (ROUTINE X 2)     Status: Normal (Preliminary result)   Collection Time   05/02/11  8:06 AM      Component Value Range Status Comment  Specimen Description BLOOD LEFT HAND   Final    Special Requests BOTTLES DRAWN AEROBIC AND ANAEROBIC [facility] EACH   Final    Setup Time [id number]   Final    Culture     Final    Value:        BLOOD CULTURE RECEIVED NO GROWTH TO DATE CULTURE WILL BE HELD FOR 5 DAYS BEFORE ISSUING A FINAL NEGATIVE REPORT   Report Status PENDING   Incomplete   URINE CULTURE     Status: Normal   Collection Time   05/02/11 10:49 AM      Component Value Range Status Comment   Specimen Description URINE, CATHETERIZED   Final    Special Requests Normal   Final    Setup Time [id number]   Final    Colony Count 50,000 COLONIES/ML   Final    Culture     Final    Value: STAPHYLOCOCCUS SPECIES (COAGULASE NEGATIVE)       Note: RIFAMPIN AND GENTAMICIN SHOULD NOT BE USED AS SINGLE DRUGS FOR TREATMENT OF STAPH INFECTIONS.   Report Status 05/04/2011 FINAL   Final    Organism ID, Bacteria STAPHYLOCOCCUS SPECIES (COAGULASE NEGATIVE)   Final   MRSA PCR SCREENING     Status: Abnormal   Collection Time   05/02/11  5:50 PM      Component Value Range Status Comment   MRSA by PCR POSITIVE (*) NEGATIVE  Final     Studies/Results: No results found.  Medications: Scheduled Meds:    . sodium chloride   Intravenous STAT  . allopurinol  300 mg Oral Daily  . aspirin  325 mg Oral Daily  . Chlorhexidine Gluconate Cloth  6 each Topical Q0600  . colchicine  0.6 mg Oral Daily  . diclofenac  1 drop Both Eyes BID  . escitalopram  10 mg Oral Daily  . ezetimibe  10 mg Oral Daily  . folic acid  0.5 mg Oral Daily  . furosemide  10 mg Intravenous Once  . gabapentin  100 mg Oral QHS  . insulin aspart  0-15 Units Subcutaneous TID WC  . insulin glargine  53 Units Subcutaneous QAC breakfast  . insulin glargine  56 Units Subcutaneous Q24H  . ipratropium  0.5 mg Nebulization Q4H  . isosorbide mononitrate  30 mg Oral Daily  . moxifloxacin  400 mg Intravenous Q24H  . mupirocin ointment  1 application Nasal BID  . polyethylene glycol  17 g Oral Once  . predniSONE  5 mg Oral Q breakfast  . simvastatin  40 mg Oral Daily  . vancomycin  1,500 mg Intravenous Q48H   Continuous Infusions:  PRN Meds:.acetaminophen, albuterol, magic mouthwash, nitroGLYCERIN  Assessment/Plan:  Active Problems:  Anemia; Positive guaic stools ? GI Bleed GI consult Will transfuse two units prbc, and give a dose of lasix 10 mg iv x 1 between transfusions.  Bacteremia: 1/2 bottles growing Coagulase negative staph  Urine culture also growing coagulase negative staph. ID following.  Acute on CKD: BUN/Cr is worse.    Diabetes Mellitus Glucose well controlled Continue Lantus BID Continue SSI.   OBSTRUCTIVE SLEEP APNEA Cont Bipap.    CORONARY ARTERY DISEASE Cont Aspirin, zetia, isosorbide.   BRONCHITIS, CHRONIC Cont Nebs Prn.  Acute bronchitis Cont avelox  Otitis media Cont avelox.   Diastolic CHF, chronic Will cont to hold diuretics at this time due to worsening renal function.      LOS: 2 days   [facility] S Pager: [phone] 05/04/2011,  4:51 PM

## 2011-05-04 NOTE — Progress Notes (Signed)
CRITICAL VALUE ALERT  Critical value received:  hgb- 6.7  Date of notification:  05/04/11  Time of notification:  0621  Critical value read back:yes  Nurse who received alert:  Adelfa Koh  MD notified (1st page):  Claiborne Billings  Time of first page:  0615  MD notified (2nd page):  Time of second page:  Responding MD:  Claiborne Billings  Time MD responded:  [phone]

## 2011-05-04 NOTE — Progress Notes (Signed)
Patient ID: [name], male   DOB: 29-Apr-1934, 77 y.o.   MRN: [id number] INFECTIOUS DISEASE PROGRESS NOTE   Date of Admission:  05/02/2011   Day 3 Avelox     . sodium chloride   Intravenous STAT  . allopurinol  300 mg Oral Daily  . aspirin  325 mg Oral Daily  . Chlorhexidine Gluconate Cloth  6 each Topical Q0600  . colchicine  0.6 mg Oral Daily  . diclofenac  1 drop Both Eyes BID  . escitalopram  10 mg Oral Daily  . ezetimibe  10 mg Oral Daily  . folic acid  0.5 mg Oral Daily  . furosemide  10 mg Intravenous Once  . gabapentin  100 mg Oral QHS  . insulin aspart  0-15 Units Subcutaneous TID WC  . insulin glargine  53 Units Subcutaneous QAC breakfast  . insulin glargine  56 Units Subcutaneous Q24H  . ipratropium  0.5 mg Nebulization Q4H  . isosorbide mononitrate  30 mg Oral Daily  . moxifloxacin  400 mg Intravenous Q24H  . mupirocin ointment  1 application Nasal BID  . polyethylene glycol  17 g Oral Once  . predniSONE  5 mg Oral Q breakfast  . simvastatin  40 mg Oral Daily  . vancomycin  1,500 mg Intravenous Q48H    Subjective: He is feeling better. His cough is improved and he is not having any more left ear pain.  Objective: Temp (24hrs), Avg:98.6 F (37 C), Min:98.1 F (36.7 C), Max:99.1 F (37.3 C)    General: He is not coughing as much he appears more comfortable. He is visiting with his son. Skin: No rash. Lungs: He has some bilateral crackles and faint expiratory wheezes. His lungs are clear than yesterday Cor: Very distant heart sounds.  Lab Results Lab Results  Component Value Date   WBC 11.9* 05/04/2011   HGB 6.7* 05/04/2011   HCT 22.4* 05/04/2011   MCV 83.0 05/04/2011   PLT 189 05/04/2011    Lab Results  Component Value Date   CREATININE 2.45* 05/04/2011   BUN 69* 05/04/2011   NA 137 05/04/2011   K 4.9 05/04/2011   CL 106 05/04/2011   CO2 22 05/04/2011    Lab Results  Component Value Date   ALT 14 05/02/2011   AST 23 05/02/2011   ALKPHOS  81 05/02/2011   BILITOT 0.3 05/02/2011     Microbiology: Recent Results (from the past 240 hour(s))  CULTURE, BLOOD (ROUTINE X 2)     Status: Normal   Collection Time   05/02/11  7:55 AM      Component Value Range Status Comment   Specimen Description BLOOD RIGHT ARM   Final    Special Requests BOTTLES DRAWN AEROBIC AND ANAEROBIC 5CC EACH   Final    Setup Time [id number]   Final    Culture     Final    Value: STAPHYLOCOCCUS SPECIES (COAGULASE NEGATIVE)     Note: THE SIGNIFICANCE OF ISOLATING THIS ORGANISM FROM A SINGLE SET OF BLOOD CULTURES WHEN MULTIPLE SETS ARE DRAWN IS UNCERTAIN. PLEASE NOTIFY THE MICROBIOLOGY DEPARTMENT WITHIN ONE WEEK IF SPECIATION AND SENSITIVITIES ARE REQUIRED.     Note: Gram Stain Report Called to,Read Back By and Verified With: Teryl Lucy RN on 05/03/11 at 06:35 by Christie Nottingham   Report Status 05/04/2011 FINAL   Final   CULTURE, BLOOD (ROUTINE X 2)     Status: Normal (Preliminary result)   Collection Time   05/02/11  8:06 AM      Component Value Range Status Comment   Specimen Description BLOOD LEFT HAND   Final    Special Requests BOTTLES DRAWN AEROBIC AND ANAEROBIC 5CC EACH   Final    Setup Time [id number]   Final    Culture     Final    Value:        BLOOD CULTURE RECEIVED NO GROWTH TO DATE CULTURE WILL BE HELD FOR 5 DAYS BEFORE ISSUING A FINAL NEGATIVE REPORT   Report Status PENDING   Incomplete   URINE CULTURE     Status: Normal   Collection Time   05/02/11 10:49 AM      Component Value Range Status Comment   Specimen Description URINE, CATHETERIZED   Final    Special Requests Normal   Final    Setup Time [id number]   Final    Colony Count 50,000 COLONIES/ML   Final    Culture     Final    Value: STAPHYLOCOCCUS SPECIES (COAGULASE NEGATIVE)     Note: RIFAMPIN AND GENTAMICIN SHOULD NOT BE USED AS SINGLE DRUGS FOR TREATMENT OF STAPH INFECTIONS.   Report Status 05/04/2011 FINAL   Final    Organism ID, Bacteria STAPHYLOCOCCUS SPECIES (COAGULASE  NEGATIVE)   Final   MRSA PCR SCREENING     Status: Abnormal   Collection Time   05/02/11  5:50 PM      Component Value Range Status Comment   MRSA by PCR POSITIVE (*) NEGATIVE  Final     Studies/Results: No results found.   Assessment: He is improving. He probably has acute on chronic bronchitis and left otitis media that are both improving with quinolone therapy. The one blood culture growing coag-negative staph is almost certainly a contaminant. He has no dysuria and his UA is negative suggesting that the coag-negative staph in his urine culture is also a contaminant. I will stop vancomycin now.  Plan: 1. Continue Avelox in oral form for a total of 5-7 days. 2. Discontinue vancomycin. 3. I will sign off now. Please call us if we can assist further.   Cliffton Asters, MD [facility] [phone] pager   [phone] cell 05/04/2011, 5:21 PM

## 2011-05-05 ENCOUNTER — Encounter (HOSPITAL_COMMUNITY): Payer: Self-pay | Admitting: *Deleted

## 2011-05-05 ENCOUNTER — Encounter (HOSPITAL_COMMUNITY)
Admission: EM | Disposition: A | Discharge status: Home Health | Payer: Self-pay | Source: Home / Self Care | Attending: Family Medicine

## 2011-05-05 DIAGNOSIS — K921 Melena: Secondary | ICD-10-CM

## 2011-05-05 DIAGNOSIS — D62 Acute posthemorrhagic anemia: Secondary | ICD-10-CM

## 2011-05-05 DIAGNOSIS — K31811 Angiodysplasia of stomach and duodenum with bleeding: Secondary | ICD-10-CM

## 2011-05-05 HISTORY — PX: ESOPHAGOGASTRODUODENOSCOPY: SHX5428

## 2011-05-05 HISTORY — PX: FLEXIBLE SIGMOIDOSCOPY: SHX5431

## 2011-05-05 LAB — BASIC METABOLIC PANEL
CO2: 22 mEq/L (ref 19–32)
Calcium: 9.6 mg/dL (ref 8.4–10.5)
GFR calc Af Amer: 45 mL/min — ABNORMAL LOW (ref >90)
Sodium: 141 mEq/L (ref 135–145)

## 2011-05-05 LAB — TYPE AND SCREEN
ABO/RH(D): O POS
Antibody Screen: NEGATIVE
Unit division: 0

## 2011-05-05 LAB — GLUCOSE, CAPILLARY
Glucose-Capillary: 90 mg/dL (ref 70–99)
Glucose-Capillary: 95 mg/dL (ref 70–99)

## 2011-05-05 LAB — OCCULT BLOOD X 1 CARD TO LAB, STOOL: Fecal Occult Bld: POSITIVE

## 2011-05-05 LAB — CBC
MCH: 25.5 pg — ABNORMAL LOW (ref 26.0–34.0)
Platelets: 180 10*3/uL (ref 150–400)
RBC: 3.64 MIL/uL — ABNORMAL LOW (ref 4.22–5.81)
RDW: 17.6 % — ABNORMAL HIGH (ref 11.5–15.5)
WBC: 10.5 10*3/uL (ref 4.0–10.5)

## 2011-05-05 SURGERY — EGD (ESOPHAGOGASTRODUODENOSCOPY)
Anesthesia: Moderate Sedation

## 2011-05-05 SURGERY — SIGMOIDOSCOPY, FLEXIBLE
Anesthesia: Moderate Sedation

## 2011-05-05 MED ORDER — FUROSEMIDE 10 MG/ML IJ SOLN
20.0000 mg | Freq: Two times a day (BID) | INTRAMUSCULAR | Status: DC
  Administered 2011-05-05 – 2011-05-06 (×3): 20 mg via INTRAVENOUS
  Filled 2011-05-05 (×6): qty 2

## 2011-05-05 MED ORDER — GLYCOPYRROLATE 0.2 MG/ML IJ SOLN
INTRAMUSCULAR | Status: AC
  Filled 2011-05-05: qty 1

## 2011-05-05 MED ORDER — VITAMINS A & D EX OINT
TOPICAL_OINTMENT | CUTANEOUS | Status: AC
  Administered 2011-05-05: 5
  Filled 2011-05-05: qty 5

## 2011-05-05 MED ORDER — IPRATROPIUM BROMIDE 0.02 % IN SOLN
0.5000 mg | Freq: Four times a day (QID) | RESPIRATORY_TRACT | Status: DC
  Administered 2011-05-05 – 2011-05-09 (×16): 0.5 mg via RESPIRATORY_TRACT
  Filled 2011-05-05 (×17): qty 2.5

## 2011-05-05 MED ORDER — ALBUTEROL SULFATE (5 MG/ML) 0.5% IN NEBU
2.5000 mg | INHALATION_SOLUTION | Freq: Four times a day (QID) | RESPIRATORY_TRACT | Status: DC
  Administered 2011-05-05 – 2011-05-09 (×16): 2.5 mg via RESPIRATORY_TRACT
  Filled 2011-05-05 (×13): qty 0.5

## 2011-05-05 MED ORDER — FENTANYL CITRATE 0.05 MG/ML IJ SOLN
INTRAMUSCULAR | Status: AC
  Filled 2011-05-05: qty 2

## 2011-05-05 MED ORDER — MIDAZOLAM HCL 10 MG/2ML IJ SOLN
INTRAMUSCULAR | Status: AC
  Filled 2011-05-05: qty 2

## 2011-05-05 MED ORDER — FLEET ENEMA 7-19 GM/118ML RE ENEM
2.0000 | ENEMA | Freq: Once | RECTAL | Status: AC
  Administered 2011-05-05: 2 via RECTAL
  Filled 2011-05-05: qty 2

## 2011-05-05 MED ORDER — MIDAZOLAM HCL 10 MG/2ML IJ SOLN
INTRAMUSCULAR | Status: DC | PRN
  Administered 2011-05-05: 1 mg via INTRAVENOUS

## 2011-05-05 MED ORDER — SODIUM CHLORIDE 0.9 % IV SOLN
INTRAVENOUS | Status: DC
  Administered 2011-05-05: 500 mL via INTRAVENOUS

## 2011-05-05 MED ORDER — FENTANYL NICU IV SYRINGE 50 MCG/ML
INJECTION | INTRAMUSCULAR | Status: DC | PRN
  Administered 2011-05-05: 25 ug via INTRAVENOUS

## 2011-05-05 MED ORDER — PANTOPRAZOLE SODIUM 40 MG IV SOLR
40.0000 mg | INTRAVENOUS | Status: DC
  Administered 2011-05-05 – 2011-05-06 (×2): 40 mg via INTRAVENOUS
  Filled 2011-05-05 (×3): qty 40

## 2011-05-05 MED ORDER — SODIUM CHLORIDE 0.9 % IV SOLN
Freq: Once | INTRAVENOUS | Status: AC
  Administered 2011-05-05: 500 mL via INTRAVENOUS

## 2011-05-05 MED ORDER — LISINOPRIL 20 MG PO TABS
20.0000 mg | ORAL_TABLET | Freq: Every day | ORAL | Status: DC
  Administered 2011-05-05 – 2011-05-09 (×5): 20 mg via ORAL
  Filled 2011-05-05 (×6): qty 1

## 2011-05-05 NOTE — Progress Notes (Signed)
Sigmoidoscopy was entirely normal. There was no old or new blood.  Endoscopy demonstrated a single gastric AVM. This was obliterated utilizing the argon plasma coagulator. Incidental gastric polyps were seen. There is mild erosive esophagitis.  The patient likely has AVMs in the small bowel that are responsible for his intermittent GI bleeding.  Recommendations #1 continue PPI #2 iron supplementation

## 2011-05-05 NOTE — Consult Note (Signed)
Referring Provider: No ref. provider found Primary Care Physician:  Kaleen Mask, MD Primary Gastroenterologist:  Dr.  Jaquita Rector for Consultation:  *Dropping hemoglobin**  HPI: [name] is a 77 y.o. male **referred for evaluation of anemia.*He was admitted for acute bronchitis complicated by MRSA.  On admission hemoglobin was 9. It has dropped to 6.7. During this time he's had several episodes of bright red blood per rectum. The patient noted similar episodes approximately one week ago. He complains of abdominal discomfort.  In 2009 he underwent a GI workup for acute GI bleeding post MI while on Plavix. Colonoscopy demonstrated diverticulosis and 2 small nonbleeding polyps. Upper dusky without diagnostic. It would capsule possibly which demonstrated a red spot correspond to possible erosion in the proximal jejunum. No definitive diagnosis was made.  The patient is taking prednisone.   Past Medical History  Diagnosis Date  . Adenomatous colon polyp   . Diverticulosis   . GERD (gastroesophageal reflux disease)   . Hiatal hernia   . Alzheimer disease   . Gout   . Renal insufficiency   . DM (diabetes mellitus)   . Asthma with bronchitis   . COPD (chronic obstructive pulmonary disease)   . Chronic hypoxemic respiratory failure   . OSA (obstructive sleep apnea)   . CHF (congestive heart failure)   . Blood transfusion   . Arthritis   . Myocardial infarction   . Pneumonia   . Anemia     Past Surgical History  Procedure Date  . Coronary artery bypass graft   . Hand surgery   . Laparoscopic cholecystectomy     Prior to Admission medications   Medication Sig Start Date End Date Taking? Authorizing Provider  acetaminophen (TYLENOL) 650 MG CR tablet Take 1,300 mg by mouth 2 (two) times daily.    Yes Historical Provider, MD  albuterol (PROVENTIL) (2.5 MG/3ML) 0.083% nebulizer solution Take 2.5 mg by nebulization every 6 (six) hours as needed. For shortness of breath   Yes  Historical Provider, MD  allopurinol (ZYLOPRIM) 300 MG tablet Take 1 tablet by mouth Twice daily. 02/17/11  Yes Historical Provider, MD  aspirin 325 MG tablet Take 325 mg by mouth daily.     Yes Historical Provider, MD  atorvastatin (LIPITOR) 20 MG tablet Take 1 tablet by mouth daily. 02/24/11  Yes Historical Provider, MD  bumetanide (BUMEX) 2 MG tablet Take 1.5 tablets by mouth Twice daily.  03/09/11  Yes Historical Provider, MD  colchicine 0.6 MG tablet Take 0.6 mg by mouth daily.    Yes Historical Provider, MD  diclofenac (VOLTAREN) 0.1 % ophthalmic solution Place 1 drop into both eyes 2 (two) times daily.     Yes Historical Provider, MD  escitalopram (LEXAPRO) 10 MG tablet Take 10 mg by mouth daily.     Yes Historical Provider, MD  ezetimibe (ZETIA) 10 MG tablet Take 10 mg by mouth daily.     Yes Historical Provider, MD  folic acid (FOLVITE) 400 MCG tablet Take 400 mcg by mouth daily.     Yes Historical Provider, MD  gabapentin (NEURONTIN) 100 MG tablet Take 100 mg by mouth at bedtime.     Yes Historical Provider, MD  insulin glargine (LANTUS) 100 UNIT/ML injection Inject 53-56 Units into the skin 2 (two) times daily. 53 units in the morning and 56 units in the evening   Yes Historical Provider, MD  isosorbide mononitrate (IMDUR) 30 MG 24 hr tablet Take 1 tablet by mouth daily. 03/09/11  Yes Historical  Provider, MD  lisinopril (PRINIVIL,ZESTRIL) 20 MG tablet Take 20 mg by mouth 2 (two) times daily.     Yes Historical Provider, MD  Multiple Vitamin (MULTIVITAMIN) tablet Take 0.5 tablets by mouth 2 (two) times daily.    Yes Historical Provider, MD  NOVOLOG FLEXPEN 100 UNIT/ML injection Inject 10-20 Units into the skin 3 (three) times daily. 10 units in the morning,20 units at noon, and 20 in the evening before meals 02/24/11  Yes Historical Provider, MD  nystatin (MYCOSTATIN) 100000 UNIT/ML suspension Take 5 mLs by mouth 4 (four) times daily as needed. For thrush.    Yes Historical Provider, MD    pantoprazole (PROTONIX) 40 MG tablet Take 40 mg by mouth daily.     Yes Historical Provider, MD  predniSONE (DELTASONE) 5 MG tablet Take 5 mg by mouth daily.     Yes Historical Provider, MD  tiotropium (SPIRIVA) 18 MCG inhalation capsule Place 18 mcg into inhaler and inhale daily.   04/21/11 04/20/12 Yes Clinton D Young, MD  albuterol (PROAIR HFA) 108 (90 BASE) MCG/ACT inhaler Inhale 2 puffs into the lungs every 6 (six) hours as needed for wheezing. 10/06/10 10/06/11  Waymon Budge, MD  clobetasol cream (TEMOVATE) 0.05 % Apply 1 application topically 2 (two) times daily as needed. For rash.     Historical Provider, MD  nitroGLYCERIN (NITROSTAT) 0.4 MG SL tablet Place 0.4 mg under the tongue every 5 (five) minutes as needed. May repeat x 3     Historical Provider, MD  PRESCRIPTION MEDICATION Apply 1 application topically 2 (two) times daily as needed. For rash. (Fluticasone/Ketoconazole) topical compounded cream     Historical Provider, MD    Current Facility-Administered Medications  Medication Dose Route Frequency Provider Last Rate Last Dose  . 0.9 %  sodium chloride infusion   Intravenous STAT Gagan S Lama      . acetaminophen (TYLENOL) tablet 650 mg  650 mg Oral Q6H PRN Gagan S Lama   650 mg at 05/05/11 0433  . albuterol (PROVENTIL) (5 MG/ML) 0.5% nebulizer solution 2.5 mg  2.5 mg Nebulization Q4H PRN Gagan S Lama   2.5 mg at 05/04/11 2339  . albuterol (PROVENTIL) (5 MG/ML) 0.5% nebulizer solution 2.5 mg  2.5 mg Nebulization Q6H WA Gagan S Lama      . allopurinol (ZYLOPRIM) tablet 300 mg  300 mg Oral Daily Gagan S Lama   300 mg at 05/04/11 0847  . aspirin tablet 325 mg  325 mg Oral Daily Gagan S Lama   325 mg at 05/04/11 0847  . Chlorhexidine Gluconate Cloth 2 % PADS 6 each  6 each Topical Q0600 Gagan S Lama   6 each at 05/04/11 0610  . colchicine tablet 0.6 mg  0.6 mg Oral Daily Gagan S Lama   0.6 mg at 05/04/11 0845  . diclofenac (VOLTAREN) 0.1 % ophthalmic solution 1 drop  1 drop Both  Eyes BID Gagan S Lama   1 drop at 05/04/11 2142  . escitalopram (LEXAPRO) tablet 10 mg  10 mg Oral Daily Gagan S Lama   10 mg at 05/04/11 0848  . ezetimibe (ZETIA) tablet 10 mg  10 mg Oral Daily Gagan S Lama   10 mg at 05/04/11 0848  . folic acid (FOLVITE) tablet 0.5 mg  0.5 mg Oral Daily Reece Packer, PHARMD   0.5 mg at 05/04/11 4098  . furosemide (LASIX) injection 10 mg  10 mg Intravenous Once [place]      .  furosemide (LASIX) injection 10 mg  10 mg Intravenous Once Gagan S Lama   10 mg at 05/04/11 2142  . gabapentin (NEURONTIN) capsule 100 mg  100 mg Oral QHS Gagan S Lama   100 mg at 05/04/11 2123  . insulin aspart (novoLOG) injection 0-15 Units  0-15 Units Subcutaneous TID WC Gagan S Lama   2 Units at 05/04/11 1724  . insulin glargine (LANTUS) injection 53 Units  53 Units Subcutaneous QAC breakfast Sarina Ill Lama   53 Units at 05/05/11 8295  . insulin glargine (LANTUS) injection 56 Units  56 Units Subcutaneous Q24H Reece Packer, [place]   56 Units at 05/04/11 2124  . ipratropium (ATROVENT) nebulizer solution 0.5 mg  0.5 mg Nebulization Q6H WA Gagan S Lama      . isosorbide mononitrate (IMDUR) 24 hr tablet 30 mg  30 mg Oral Daily Gagan S Lama   30 mg at 05/05/11 6213  . magic mouthwash  10 mL Oral BID PRN Gagan S Lama   10 mL at 05/04/11 1110  . moxifloxacin (AVELOX) IVPB 400 mg  400 mg Intravenous Q24H Gagan S Lama   400 mg at 05/04/11 1238  . mupirocin ointment (BACTROBAN) 2 % 1 application  1 application Nasal BID Sarina Ill Lama   1 application at 05/04/11 2130  . nitroGLYCERIN (NITROSTAT) SL tablet 0.4 mg  0.4 mg Sublingual Q5 min PRN Gagan S Lama      . predniSONE (DELTASONE) tablet 5 mg  5 mg Oral Q breakfast Gagan S Lama   5 mg at 05/05/11 0865  . simvastatin (ZOCOR) tablet 40 mg  40 mg Oral Daily Gagan S Lama   40 mg at 05/04/11 1111  . vancomycin (VANCOCIN) 1,500 mg in sodium chloride 0.9 % 500 mL IVPB  1,500 mg Intravenous Q48H Leann Trefz Poindexter, PHARMD    1,500 mg at 05/05/11 7846  . DISCONTD: ipratropium (ATROVENT) nebulizer solution 0.5 mg  0.5 mg Nebulization Q4H Gagan S Lama   0.5 mg at 05/04/11 2339    Allergies as of 05/02/2011 - Review Complete 05/02/2011  Allergen Reaction Noted  . Cefuroxime Other (See Comments)   . Donepezil Diarrhea 05/02/2011  . Heparin Itching   . Hydralazine Other (See Comments) 05/02/2011  . Hydromorphone Itching 05/02/2011  . Metoprolol Other (See Comments)   . Morphine Itching   . Plavix (clopidogrel bisulfate) Other (See Comments) 05/02/2011  . Promethazine Other (See Comments) 05/02/2011  . Sulfonamide derivatives Itching 05/22/2008  . Zaroxolyn (metolazone) Diarrhea 05/02/2011  . Ertapenem Itching and Rash 05/02/2011    Family History  Problem Relation Age of Onset  . Melanoma Mother   . Lung cancer Brother   . Diabetes    . Heart disease    . Stomach cancer      grandfather    History   Social History  . Marital Status: Married    Spouse Name: N/A    Number of Children: N/A  . Years of Education: N/A   Occupational History  . retired    Social History Main Topics  . Smoking status: Former Games developer  . Smokeless tobacco: Former Neurosurgeon    Quit date: 06/01/1990  . Alcohol Use: No  . Drug Use: No  . Sexually Active: Not on file   Other Topics Concern  . Not on file   Social History Narrative   Daily caffeine use    Review of Systems: The patient has dyspnea on exertion and mild baseline shortness of  breath. Pertinent positive and negative review of systems were noted in the above history of present illness section. All other review of systems were otherwise negative   Physical Exam: Vital signs in last 24 hours: Temp:  [98.3 F (36.8 C)-100.4 F (38 C)] 98.9 F (37.2 C) (12/11 0600) Pulse Rate:  [69-94] 85  (12/11 0629) Resp:  [18-24] 20  (12/11 0600) BP: (108-185)/(56-77) 180/69 mmHg (12/11 0629) SpO2:  [91 %-99 %] 91 % (12/11 0600) Weight:  [112.4 kg (247 lb 12.8 oz)]  247 lb 12.8 oz (112.4 kg) (12/11 0600)  General:   Alert,  Well-developed, well-nourished, pleasant and cooperative in NAD Head:  Normocephalic and atraumatic. Eyes:  Sclera clear, no icterus.   Conjunctiva pink. Ears:  Normal auditory acuity. Nose:  No deformity, discharge,  or lesions. Mouth:  No deformity or lesions.  Oropharynx pink & moist. Neck:  Supple; no masses or thyromegaly. Lungs:  Clear throughout to auscultation.   No wheezes, crackles, or rhonchi. No acute distress. Heart:  Regular rate and rhythm; no murmurs, clicks, rubs,  or gallops. Abdomen:  Hypoactive bowel sounds. Abdomen is distended and tympanitic.Marland Kitchen No masses, hepatosplenomegaly or hernias noted. Normal bowel sounds, without guarding, and without rebound.   Rectal:  Deferred until time of colonoscopy.   Msk:  Symmetrical without gross deformities. Normal posture. Pulses:  Normal pulses noted. Extremities:  Without clubbing or edema. Neurologic:  Alert and  oriented x4;  grossly normal neurologically. Skin:  Intact without significant lesions or rashes. Cervical Nodes:  No significant cervical adenopathy. Psych:  Alert and cooperative. Normal mood and affect.  Intake/Output from previous day: 12/10 0701 - 12/11 0700 In: 2415.4 [P.O.:600; I.V.:600; Blood:965.4; IV Piggyback:250] Out: 1950 [Urine:1950] Intake/Output this shift:    Lab Results:  Basename 05/04/11 0452 05/03/11 0510  WBC 11.9* 20.4*  HGB 6.7* 7.9*  HCT 22.4* 26.5*  PLT 189 213   BMET  Basename 05/04/11 0452 05/03/11 0510  NA 137 139  K 4.9 4.9  CL 106 104  CO2 22 25  GLUCOSE 231* 121*  BUN 69* 84*  CREATININE 2.45* 3.56*  CALCIUM 9.1 9.4   LFT No results found for this basename: PROT,ALBUMIN,AST,ALT,ALKPHOS,BILITOT,BILIDIR,IBILI in the last 72 hours PT/INR No results found for this basename: LABPROT:2,INR:2 in the last 72 hours Hepatitis Panel No results found for this basename: HEPBSAG,HCVAB,HEPAIGM,HEPBIGM in the last 72  hours Additional Labs   Studies/Results: Dg Chest 2 View  05/04/2011  *RADIOLOGY REPORT*  Clinical Data: Shortness of breath.  CHEST - 2 VIEW  Comparison: 05/02/2011 and 11/20/2010.  Findings: Marked cardiomegaly and post mediastinotomy.  Pulmonary vascular prominence most notable centrally. Subtle left base infiltrate/atelectasis.  This can be assessed on follow-up.  IMPRESSION: Subtle left base infiltrate/atelectasis.  Original Report Authenticated By: Fuller Canada, M.D.    Impression/Plan: **Subacute drop in hemoglobin*probably related to GI bleeding. Acute bronchitis MRSA Sleep apnea Coronary artery disease Chronic kidney disease  Recommendations #1 begin Protonix 40 mg IV daily #2 upper endoscopy and possible sigmoidoscopy  Assessment: patient's GI bleeding could be due to active peptic ulcer disease. Of note he  was not on a PPI prior to admission. At the same time, he has recent history of rectal bleeding raising the question of a distal colonic bleeding source.    Barbette Hair. Arlyce Dice, MD, [facility] [phone]    05/05/2011, 8:58 AM

## 2011-05-05 NOTE — Progress Notes (Addendum)
Subjective: Patient seen and examined, H&H dropped, s/p Blood transfusion, GI did EGD, which showed AVM, and mild erosive esophagitis. Patient also more short of breath today, BP elevated, Bumex was on hold due to hypotension.  Objective: Vital signs in last 24 hours: Temp:  [98.4 F (36.9 C)-100.4 F (38 C)] 98.9 F (37.2 C) (12/11 1430) Pulse Rate:  [79-94] 90  (12/11 1302) Resp:  [18-39] 35  (12/11 1630) BP: (156-188)/(56-84) 175/63 mmHg (12/11 1630) SpO2:  [91 %-99 %] 97 % (12/11 1630) Weight:  [112.4 kg (247 lb 12.8 oz)] 247 lb 12.8 oz (112.4 kg) (12/11 0600) Weight change: 0.6 kg (1 lb 5.2 oz) Last BM Date: 05/04/11  Intake/Output from previous day: 12/10 0701 - 12/11 0700 In: 2415.4 [P.O.:600; I.V.:600; Blood:965.4; IV Piggyback:250] Out: 1950 [Urine:1950] Total I/O In: 452 [I.V.:200; IV Piggyback:252] Out: 1152 [Urine:1150; Emesis/NG output:1; Stool:1]   Physical Exam: General: Alert, awake, oriented x3, in no acute distress. HEENT: Positive erythema on TM in left ear Heart: Regular rate and rhythm, without murmurs, rubs, gallops. Lungs: B/L Rhonchi Abdomen: Soft, nontender, nondistended, positive bowel sounds. Extremities: No clubbing cyanosis or edema with positive pedal pulses. Neuro: Grossly intact, nonfocal.    Lab Results: Basic Metabolic Panel:  Basename 05/05/11 0940 05/04/11 0452  NA 141 137  K 4.8 4.9  CL 109 106  CO2 22 22  GLUCOSE 111* 231*  BUN 42* 69*  CREATININE 1.66* 2.45*  CALCIUM 9.6 9.1  MG -- --  PHOS -- --   CBC:  Basename 05/05/11 0940 05/04/11 0452  WBC 10.5 11.9*  NEUTROABS -- --  HGB 9.3* 6.7*  HCT 30.4* 22.4*  MCV 83.5 83.0  PLT 180 189     Basename 05/05/11 1131 05/05/11 0740 05/04/11 2037 05/04/11 1703 05/04/11 1115 05/04/11 0753  GLUCAP 115* 90 141* 144* 152* 148*    Misc. Labs:  Recent Results (from the past 240 hour(s))  CULTURE, BLOOD (ROUTINE X 2)     Status: Normal   Collection Time   05/02/11  7:55 AM        Component Value Range Status Comment   Specimen Description BLOOD RIGHT ARM   Final    Special Requests BOTTLES DRAWN AEROBIC AND ANAEROBIC 5CC EACH   Final    Setup Time [id number]   Final    Culture     Final    Value: STAPHYLOCOCCUS SPECIES (COAGULASE NEGATIVE)     Note: THE SIGNIFICANCE OF ISOLATING THIS ORGANISM FROM A SINGLE SET OF BLOOD CULTURES WHEN MULTIPLE SETS ARE DRAWN IS UNCERTAIN. PLEASE NOTIFY THE MICROBIOLOGY DEPARTMENT WITHIN ONE WEEK IF SPECIATION AND SENSITIVITIES ARE REQUIRED.     Note: Gram Stain Report Called to,Read Back By and Verified With: Teryl Lucy RN on 05/03/11 at 06:35 by Christie Nottingham   Report Status 05/04/2011 FINAL   Final   CULTURE, BLOOD (ROUTINE X 2)     Status: Normal (Preliminary result)   Collection Time   05/02/11  8:06 AM      Component Value Range Status Comment   Specimen Description BLOOD LEFT HAND   Final    Special Requests BOTTLES DRAWN AEROBIC AND ANAEROBIC [facility]   Final    Setup Time [id number]   Final    Culture     Final    Value:        BLOOD CULTURE RECEIVED NO GROWTH TO DATE CULTURE WILL BE HELD FOR 5 DAYS BEFORE ISSUING A FINAL NEGATIVE REPORT   Report Status  PENDING   Incomplete   URINE CULTURE     Status: Normal   Collection Time   05/02/11 10:49 AM      Component Value Range Status Comment   Specimen Description URINE, CATHETERIZED   Final    Special Requests Normal   Final    Setup Time [id number]   Final    Colony Count 50,000 COLONIES/ML   Final    Culture     Final    Value: STAPHYLOCOCCUS SPECIES (COAGULASE NEGATIVE)     Note: RIFAMPIN AND GENTAMICIN SHOULD NOT BE USED AS SINGLE DRUGS FOR TREATMENT OF STAPH INFECTIONS.   Report Status 05/04/2011 FINAL   Final    Organism ID, Bacteria STAPHYLOCOCCUS SPECIES (COAGULASE NEGATIVE)   Final   MRSA PCR SCREENING     Status: Abnormal   Collection Time   05/02/11  5:50 PM      Component Value Range Status Comment   MRSA by PCR POSITIVE (*) NEGATIVE  Final      Studies/Results: Dg Chest 2 View  05/04/2011  *RADIOLOGY REPORT*  Clinical Data: Shortness of breath.  CHEST - 2 VIEW  Comparison: 05/02/2011 and 11/20/2010.  Findings: Marked cardiomegaly and post mediastinotomy.  Pulmonary vascular prominence most notable centrally. Subtle left base infiltrate/atelectasis.  This can be assessed on follow-up.  IMPRESSION: Subtle left base infiltrate/atelectasis.  Original Report Authenticated By: Fuller Canada, M.D.    Medications: Scheduled Meds:    . sodium chloride   Intravenous STAT  . sodium chloride   Intravenous Once  . albuterol  2.5 mg Nebulization Q6H WA  . allopurinol  300 mg Oral Daily  . Chlorhexidine Gluconate Cloth  6 each Topical Q0600  . colchicine  0.6 mg Oral Daily  . diclofenac  1 drop Both Eyes BID  . escitalopram  10 mg Oral Daily  . ezetimibe  10 mg Oral Daily  . folic acid  0.5 mg Oral Daily  . furosemide  10 mg Intravenous Once  . furosemide  10 mg Intravenous Once  . furosemide  20 mg Intravenous BID  . gabapentin  100 mg Oral QHS  . insulin aspart  0-15 Units Subcutaneous TID WC  . insulin glargine  53 Units Subcutaneous QAC breakfast  . insulin glargine  56 Units Subcutaneous Q24H  . ipratropium  0.5 mg Nebulization Q6H WA  . isosorbide mononitrate  30 mg Oral Daily  . moxifloxacin  400 mg Intravenous Q24H  . mupirocin ointment  1 application Nasal BID  . pantoprazole (PROTONIX) IV  40 mg Intravenous Q24H  . simvastatin  40 mg Oral Daily  . sodium phosphate  2 enema Rectal Once  . vancomycin  1,500 mg Intravenous Q48H  . DISCONTD: aspirin  325 mg Oral Daily  . DISCONTD: ipratropium  0.5 mg Nebulization Q4H  . DISCONTD: predniSONE  5 mg Oral Q breakfast   Continuous Infusions:    . sodium chloride 500 mL (05/05/11 1425)   PRN Meds:.acetaminophen, albuterol, magic mouthwash, nitroGLYCERIN, DISCONTD: fentaNYL, DISCONTD: midazolam  Assessment/Plan:    Anemia Positive guaic stools S/p Blood  transfusion H&H has improved.  GI Bleed Continue Protonix. Aspirin and Prednisone on hold Will need to check with GI , when it is ok to restart both Patient has been on long term prednisone 5 mg po daily for gout.  Bacteremia 1/2 bottles growing Coagulase negative staph  Urine culture also growing coagulase negative staph. ID has discontinued the vancomycin.  Acute Diastolic dysfunction Will restart lasix  20 mg IV q 12 hr.  Acute on CKD: BUN/Cr improved.  Diabetes Mellitus Glucose well controlled Continue Lantus BID Continue SSI.   OBSTRUCTIVE SLEEP APNEA Cont Bipap.   CORONARY ARTERY DISEASE Cont Aspirin, zetia, isosorbide.  BRONCHITIS, CHRONIC Cont Nebs Prn.  Acute bronchitis Cont avelox  Otitis media Cont avelox.  Hypertension Will restart Lisinopril 20 mg po daily.       LOS: 3 days   [facility] Pager: [phone] 05/05/2011, 4:51 PM

## 2011-05-06 ENCOUNTER — Encounter (HOSPITAL_COMMUNITY): Payer: Self-pay | Admitting: Gastroenterology

## 2011-05-06 DIAGNOSIS — D62 Acute posthemorrhagic anemia: Secondary | ICD-10-CM

## 2011-05-06 DIAGNOSIS — K31811 Angiodysplasia of stomach and duodenum with bleeding: Secondary | ICD-10-CM

## 2011-05-06 LAB — BASIC METABOLIC PANEL
CO2: 24 mEq/L (ref 19–32)
Calcium: 9.5 mg/dL (ref 8.4–10.5)
GFR calc non Af Amer: 39 mL/min — ABNORMAL LOW (ref >90)
Sodium: 141 mEq/L (ref 135–145)

## 2011-05-06 LAB — GLUCOSE, CAPILLARY
Glucose-Capillary: 136 mg/dL — ABNORMAL HIGH (ref 70–99)
Glucose-Capillary: 127 mg/dL — ABNORMAL HIGH (ref 70–99)

## 2011-05-06 LAB — CBC
MCV: 84.1 fL (ref 78.0–100.0)
Platelets: 197 10*3/uL (ref 150–400)
RBC: 3.52 MIL/uL — ABNORMAL LOW (ref 4.22–5.81)
WBC: 8.2 10*3/uL (ref 4.0–10.5)

## 2011-05-06 MED ORDER — ALBUTEROL SULFATE (5 MG/ML) 0.5% IN NEBU
INHALATION_SOLUTION | RESPIRATORY_TRACT | Status: AC
  Administered 2011-05-06: 2.5 mg via RESPIRATORY_TRACT
  Filled 2011-05-06: qty 0.5

## 2011-05-06 MED ORDER — PREDNISONE 5 MG PO TABS
5.0000 mg | ORAL_TABLET | Freq: Every day | ORAL | Status: DC
  Administered 2011-05-07 – 2011-05-09 (×3): 5 mg via ORAL
  Filled 2011-05-06 (×4): qty 1

## 2011-05-06 MED ORDER — METHOCARBAMOL 500 MG PO TABS: 500.0000 mg | ORAL_TABLET | Freq: Three times a day (TID) | ORAL | Status: DC | PRN

## 2011-05-06 MED ORDER — ASPIRIN EC 325 MG PO TBEC
325.0000 mg | DELAYED_RELEASE_TABLET | Freq: Every day | ORAL | Status: DC
  Administered 2011-05-06 – 2011-05-09 (×4): 325 mg via ORAL
  Filled 2011-05-06 (×5): qty 1

## 2011-05-06 MED ORDER — FERROUS SULFATE 325 (65 FE) MG PO TABS
325.0000 mg | ORAL_TABLET | Freq: Two times a day (BID) | ORAL | Status: DC
  Administered 2011-05-07 – 2011-05-09 (×5): 325 mg via ORAL
  Filled 2011-05-06 (×8): qty 1

## 2011-05-06 MED ORDER — FUROSEMIDE 40 MG PO TABS
40.0000 mg | ORAL_TABLET | Freq: Every day | ORAL | Status: DC
  Administered 2011-05-06 – 2011-05-09 (×4): 40 mg via ORAL
  Filled 2011-05-06 (×5): qty 1

## 2011-05-06 MED ORDER — PANTOPRAZOLE SODIUM 40 MG PO TBEC
40.0000 mg | DELAYED_RELEASE_TABLET | Freq: Every day | ORAL | Status: DC
  Administered 2011-05-06 – 2011-05-09 (×4): 40 mg via ORAL
  Filled 2011-05-06 (×5): qty 1

## 2011-05-06 NOTE — Progress Notes (Signed)
Patient ID: [name], male   DOB: [birth year], 77 y.o.   MRN: [id number]  Subjective: No events overnight. Patient denies chest pain, shortness of breath, abdominal pain. Had bowel movement and reports ambulating.  Objective:  Vital signs in last 24 hours:  Filed Vitals:   05/06/11 0717 05/06/11 0852 05/06/11 1434 05/06/11 1514  BP: 153/61  153/63   Pulse: 80  74   Temp: 98.6 F (37 C)  98.2 F (36.8 C)   TempSrc: Oral  Oral   Resp: 24  22   Height:      Weight: 111.041 kg (244 lb 12.8 oz)     SpO2: 97% 96% 97% 98%    Intake/Output from previous day:   Intake/Output Summary (Last 24 hours) at 05/06/11 2051 Last data filed at 05/06/11 1258  Gross per 24 hour  Intake   1030 ml  Output   1526 ml  Net   -496 ml    Physical Exam: General: Alert, awake, oriented x3, in no acute distress. HEENT: No bruits, no goiter. Moist mucous membranes, no scleral icterus, no conjunctival pallor. Heart: Regular rate and rhythm, without murmurs, rubs, gallops. Lungs: Clear to auscultation bilaterally. No wheezing, no rhonchi, no rales.  Abdomen: Soft, nontender, nondistended, positive bowel sounds. Extremities: No clubbing cyanosis or edema,  positive pedal pulses. Neuro: Grossly intact, nonfocal.    Lab Results:  Basic Metabolic Panel:    Component Value Date/Time   NA 141 05/06/2011 0456   K 4.4 05/06/2011 0456   CL 108 05/06/2011 0456   CO2 24 05/06/2011 0456   BUN 32* 05/06/2011 0456   CREATININE 1.64* 05/06/2011 0456   GLUCOSE 106* 05/06/2011 0456   CALCIUM 9.5 05/06/2011 0456   CALCIUM 7.9* 08/09/2008 0350   CBC:    Component Value Date/Time   WBC 8.2 05/06/2011 0456   HGB 8.9* 05/06/2011 0456   HCT 29.6* 05/06/2011 0456   PLT 197 05/06/2011 0456   MCV 84.1 05/06/2011 0456   NEUTROABS 12.6* 05/02/2011 0811   LYMPHSABS 2.2 05/02/2011 0811   MONOABS 1.2* 05/02/2011 0811   EOSABS 0.5 05/02/2011 0811   BASOSABS 0.0 05/02/2011 0811      Lab 05/06/11 0456  05/05/11 0940 05/04/11 0452 05/03/11 0510 05/02/11 0811  WBC 8.2 10.5 11.9* 20.4* 16.5*  HGB 8.9* 9.3* 6.7* 7.9* 9.0*  HCT 29.6* 30.4* 22.4* 26.5* 30.0*  PLT 197 180 189 213 245  MCV 84.1 83.5 83.0 83.6 83.3  MCH 25.3* 25.5* 24.8* 24.9* 25.0*  MCHC 30.1 30.6 29.9* 29.8* 30.0  RDW 17.9* 17.6* 18.6* 18.5* 18.3*  LYMPHSABS -- -- -- -- 2.2  MONOABS -- -- -- -- 1.2*  EOSABS -- -- -- -- 0.5  BASOSABS -- -- -- -- 0.0  BANDABS -- -- -- -- --    Lab 05/06/11 0456 05/05/11 0940 05/04/11 0452 05/03/11 0510 05/02/11 0811  NA 141 141 137 139 137  K 4.4 4.8 4.9 4.9 4.5  CL 108 109 106 104 100  CO2 24 22 22 25 23   GLUCOSE 106* 111* 231* 121* 127*  BUN 32* 42* 69* 84* 81*  CREATININE 1.64* 1.66* 2.45* 3.56* 2.76*  CALCIUM 9.5 9.6 9.1 9.4 9.7  MG -- -- -- -- --   No results found for this basename: INR:5,PROTIME:5 in the last 168 hours Cardiac markers:  Lab 05/02/11 0812  CKMB 3.0  TROPONINI <0.30  MYOGLOBIN --   No components found with this basename: POCBNP:3 Recent Results (from the past 240  hour(s))  CULTURE, BLOOD (ROUTINE X 2)     Status: Normal   Collection Time   05/02/11  7:55 AM      Component Value Range Status Comment   Specimen Description BLOOD RIGHT ARM   Final    Special Requests BOTTLES DRAWN AEROBIC AND ANAEROBIC 5CC EACH   Final    Setup Time [id number]   Final    Culture     Final    Value: STAPHYLOCOCCUS SPECIES (COAGULASE NEGATIVE)     Note: THE SIGNIFICANCE OF ISOLATING THIS ORGANISM FROM A SINGLE SET OF BLOOD CULTURES WHEN MULTIPLE SETS ARE DRAWN IS UNCERTAIN. PLEASE NOTIFY THE MICROBIOLOGY DEPARTMENT WITHIN ONE WEEK IF SPECIATION AND SENSITIVITIES ARE REQUIRED.     Note: Gram Stain Report Called to,Read Back By and Verified With: Teryl Lucy RN on 05/03/11 at 06:35 by Christie Nottingham   Report Status 05/04/2011 FINAL   Final   CULTURE, BLOOD (ROUTINE X 2)     Status: Normal (Preliminary result)   Collection Time   05/02/11  8:06 AM      Component Value Range  Status Comment   Specimen Description BLOOD LEFT HAND   Final    Special Requests BOTTLES DRAWN AEROBIC AND ANAEROBIC [facility] EACH   Final    Setup Time [id number]   Final    Culture     Final    Value:        BLOOD CULTURE RECEIVED NO GROWTH TO DATE CULTURE WILL BE HELD FOR 5 DAYS BEFORE ISSUING A FINAL NEGATIVE REPORT   Report Status PENDING   Incomplete   URINE CULTURE     Status: Normal   Collection Time   05/02/11 10:49 AM      Component Value Range Status Comment   Specimen Description URINE, CATHETERIZED   Final    Special Requests Normal   Final    Setup Time [id number]   Final    Colony Count 50,000 COLONIES/ML   Final    Culture     Final    Value: STAPHYLOCOCCUS SPECIES (COAGULASE NEGATIVE)     Note: RIFAMPIN AND GENTAMICIN SHOULD NOT BE USED AS SINGLE DRUGS FOR TREATMENT OF STAPH INFECTIONS.   Report Status 05/04/2011 FINAL   Final    Organism ID, Bacteria STAPHYLOCOCCUS SPECIES (COAGULASE NEGATIVE)   Final   MRSA PCR SCREENING     Status: Abnormal   Collection Time   05/02/11  5:50 PM      Component Value Range Status Comment   MRSA by PCR POSITIVE (*) NEGATIVE  Final     Studies/Results: No results found.  Medications: Scheduled Meds:   . albuterol  2.5 mg Nebulization Q6H WA  . allopurinol  300 mg Oral Daily  . Chlorhexidine Gluconate Cloth  6 each Topical Q0600  . colchicine  0.6 mg Oral Daily  . diclofenac  1 drop Both Eyes BID  . escitalopram  10 mg Oral Daily  . ezetimibe  10 mg Oral Daily  . folic acid  0.5 mg Oral Daily  . furosemide  10 mg Intravenous Once  . furosemide  20 mg Intravenous BID  . gabapentin  100 mg Oral QHS  . insulin aspart  0-15 Units Subcutaneous TID WC  . insulin glargine  53 Units Subcutaneous QAC breakfast  . insulin glargine  56 Units Subcutaneous Q24H  . ipratropium  0.5 mg Nebulization Q6H WA  . isosorbide mononitrate  30 mg Oral Daily  . lisinopril  20 mg  Oral Daily  . moxifloxacin  400 mg Intravenous Q24H  . mupirocin  ointment  1 application Nasal BID  . pantoprazole (PROTONIX) IV  40 mg Intravenous Q24H  . simvastatin  40 mg Oral Daily   Continuous Infusions:   . sodium chloride 500 mL (05/05/11 1425)   PRN Meds:.acetaminophen, albuterol, magic mouthwash, nitroGLYCERIN  Assessment/Plan:  Anemia  Positive guaic stools  S/p Blood transfusion  H&H are now stable and at pt's baseline CBC in AM  GI Bleed  Secondary to AVM per endoscopy results Will continue PPI Per GI we may also continue ASA and low dose Prednisone 5 mg PO QD .  Bacteremia  1/2 bottles growing Coagulase negative staph  Urine culture also growing coagulase negative staph.  ID has discontinued the vancomycin.  However, no with Left lung base infiltrate, continue Avalox  Acute Diastolic dysfunction  Will change lasix to PO  Acute on CKD:  BUN/Cr improved.  Now at pt's baseline BMP in AM  Diabetes Mellitus  Glucose well controlled  Continue Lantus BID  Continue SSI.  Check A1C  OBSTRUCTIVE SLEEP APNEA  Cont Bipap.   CORONARY ARTERY DISEASE  Cont Aspirin, zetia, isosorbide.   Acute bronchitis  Cont avelox   Otitis media  Cont avelox.   Hypertension  Will restart Lisinopril 20 mg po daily.   Disposition - plan of care and diagnosis, diagnostic studies and test results were discussed with pt and family at bedside - pt and  family verbalized understanding    LOS: 4 days   MAGICK-MYERS, ISKRA 05/06/2011, 8:51 PM

## 2011-05-06 NOTE — Progress Notes (Signed)
Patient ID: ARLIS YALE, male   DOB: 09-15-1933, 77 y.o.   MRN: [id number] [facility] Progress Note  Subjective: Sleeping sitting up in chair, did not rest last night due to SOB, Daughter in law in room,pt has had no GI c/o-stools brown, no obvious blood. She relates he has severe problems with gout,needs his prednisone, also has multiple stents-on chronic ASA.   Objective:  Vital signs in last 24 hours: Temp:  [98.4 F (36.9 C)-98.9 F (37.2 C)] 98.6 F (37 C) (12/12 0717) Pulse Rate:  [80-96] 80  (12/12 0717) Resp:  [20-39] 24  (12/12 0717) BP: (153-188)/(61-84) 153/61 mmHg (12/12 0717) SpO2:  [91 %-97 %] 96 % (12/12 0852) Weight:  [111.041 kg (244 lb 12.8 oz)] 244 lb 12.8 oz (111.041 kg) (12/12 0717) Last BM Date: 05/04/11 General:   Sleeping,  Well-developed,obese elderly male  in NAD Heart:  Regular rate and rhythm; no murmurs Pulm;scattered rhonchi no wheeze, few basilar crackles Abdomen:  Soft,obese, nontender, bs active  Intake/Output from previous day: 12/11 0701 - 12/12 0700 In: 1412 [P.O.:960; I.V.:200; IV Piggyback:252] Out: 2454 [Urine:2450; Emesis/NG output:1; Stool:3] Intake/Output this shift: Total I/O In: -  Out: 225 [Urine:225]  Lab Results:  [facility] 05/06/11 0456 05/05/11 0940 05/04/11 0452  WBC 8.2 10.5 11.9*  HGB 8.9* 9.3* 6.7*  HCT 29.6* 30.4* 22.4*  PLT 197 180 189   BMET  Basename 05/06/11 0456 05/05/11 0940 05/04/11 0452  NA 141 141 137  K 4.4 4.8 4.9  CL 108 109 106  CO2 24 22 22   GLUCOSE 106* 111* 231*  BUN 32* 42* 69*  CREATININE 1.64* 1.66* 2.45*  CALCIUM 9.5 9.6 9.1         Assessment / Plan: #1 77 yo male with marked anemia, heme positive stool- secondary to AVM's-supect small bowel avm's in addition to gastric avm which was ablated yesterday..  #2 Diverticulosis- may have had a diverticular bleed which has resolved since admit He will need chronic fe Rx , regular cbc's as outpatient, and may need periodic  transfusion.   Ok from [employer] standpoint to remain on  Baby asa and low dose prednisone- can resume.  Plavix can be resumed at discharge We will sign off, please call for problems.  Active Problems:  DM  OBSTRUCTIVE SLEEP APNEA  CORONARY ARTERY DISEASE  Chronic kidney disease, unspecified  BRONCHITIS, CHRONIC  Acute bronchitis  Diastolic CHF, chronic  Fever  Acute posthemorrhagic anemia     LOS: 4 days   Nakeda Lebron  05/06/2011, 9:20 AM

## 2011-05-07 LAB — HEMOGLOBIN A1C: Hgb A1c MFr Bld: 7.4 % — ABNORMAL HIGH (ref <5.7)

## 2011-05-07 LAB — BASIC METABOLIC PANEL
Calcium: 9.6 mg/dL (ref 8.4–10.5)
Creatinine, Ser: 1.67 mg/dL — ABNORMAL HIGH (ref 0.50–1.35)
GFR calc Af Amer: 45 mL/min — ABNORMAL LOW (ref >90)

## 2011-05-07 LAB — CBC
MCH: 25.3 pg — ABNORMAL LOW (ref 26.0–34.0)
MCHC: 29.9 g/dL — ABNORMAL LOW (ref 30.0–36.0)
MCV: 84.6 fL (ref 78.0–100.0)
Platelets: 211 10*3/uL (ref 150–400)
RDW: 17.8 % — ABNORMAL HIGH (ref 11.5–15.5)

## 2011-05-07 LAB — GLUCOSE, CAPILLARY: Glucose-Capillary: 160 mg/dL — ABNORMAL HIGH (ref 70–99)

## 2011-05-07 MED ORDER — ALBUTEROL SULFATE (5 MG/ML) 0.5% IN NEBU
INHALATION_SOLUTION | RESPIRATORY_TRACT | Status: AC
  Administered 2011-05-07: 2.5 mg via RESPIRATORY_TRACT
  Filled 2011-05-07: qty 0.5

## 2011-05-07 MED ORDER — VANCOMYCIN HCL 1000 MG IV SOLR
1500.0000 mg | INTRAVENOUS | Status: DC
  Administered 2011-05-07 – 2011-05-08 (×2): 1500 mg via INTRAVENOUS
  Filled 2011-05-07 (×2): qty 1500

## 2011-05-07 MED ORDER — HYDROCOD POLST-CHLORPHEN POLST 10-8 MG/5ML PO LQCR
5.0000 mL | Freq: Every day | ORAL | Status: DC
  Administered 2011-05-07 – 2011-05-08 (×2): 5 mL via ORAL
  Filled 2011-05-07 (×2): qty 5

## 2011-05-07 MED ORDER — METHOCARBAMOL 500 MG PO TABS
500.0000 mg | ORAL_TABLET | Freq: Two times a day (BID) | ORAL | Status: DC
  Administered 2011-05-07 – 2011-05-08 (×2): 500 mg via ORAL
  Filled 2011-05-07 (×3): qty 1

## 2011-05-07 NOTE — Progress Notes (Signed)
Physical Therapy Evaluation Patient Details Name: [name] MRN: [id number] DOB: 1933/09/11 Today's Date: 05/07/2011 Time: 0865-7846  Eval II Problem List:  Patient Active Problem List  Diagnoses  . DM  . ANEMIA  . OBSTRUCTIVE SLEEP APNEA  . CORONARY ARTERY DISEASE  . Acute and chronic respiratory failure  . DIVERTICULOSIS OF COLON WITH HEMORRHAGE  . GI BLEED  . Chronic kidney disease, unspecified  . FECAL OCCULT BLOOD  . BRONCHITIS, CHRONIC  . Gout  . Acute bronchitis  . Diastolic CHF, chronic  . Fever  . Acute posthemorrhagic anemia    Past Medical History:  Past Medical History  Diagnosis Date  . Adenomatous colon polyp   . Diverticulosis   . GERD (gastroesophageal reflux disease)   . Hiatal hernia   . Alzheimer disease   . Gout   . Renal insufficiency   . DM (diabetes mellitus)   . Asthma with bronchitis   . COPD (chronic obstructive pulmonary disease)   . Chronic hypoxemic respiratory failure   . OSA (obstructive sleep apnea)   . CHF (congestive heart failure)   . Blood transfusion   . Arthritis   . Myocardial infarction   . Pneumonia   . Anemia    Past Surgical History:  Past Surgical History  Procedure Date  . Coronary artery bypass graft   . Hand surgery   . Laparoscopic cholecystectomy   . Esophagogastroduodenoscopy 05/05/2011    Procedure: ESOPHAGOGASTRODUODENOSCOPY (EGD);  Surgeon: Louis Meckel, MD;  Location: [facility];  Service: Endoscopy;  Laterality: N/A;  . Flexible sigmoidoscopy 05/05/2011    Procedure: FLEXIBLE SIGMOIDOSCOPY;  Surgeon: Louis Meckel, MD;  Location: [facility];  Service: Endoscopy;  Laterality: N/A;    PT Assessment/Plan/Recommendation PT Assessment Clinical Impression Statement: Pt presents with diagnosis of chronic kidney disease. Pt will benefit from skilled PT in the acute care setting to improve general strength, activity tolerance, and overall functional mobility in preperation for D/C home with  family assist.  PT Recommendation/Assessment: Patient will need skilled PT in the acute care venue PT Problem List: Decreased strength;Decreased activity tolerance;Decreased mobility PT Therapy Diagnosis : Difficulty walking;Generalized weakness PT Plan PT Frequency: Min 3X/week PT Treatment/Interventions: Gait training;Functional mobility training;Therapeutic activities;Therapeutic exercise;Patient/family education PT Recommendation Recommendations for Other Services: OT consult Follow Up Recommendations: Home health PT Equipment Recommended: None recommended by PT PT Goals  Acute Rehab PT Goals PT Goal Formulation: With patient Pt will go Supine/Side to Sit: with supervision Pt will go Sit to Supine/Side: with supervision Pt will go Sit to Stand: with supervision Pt will go Stand to Sit: with supervision Pt will Ambulate: 51 - 150 feet;with supervision  PT Evaluation Precautions/Restrictions    Prior Functioning  Home Living Lives With: Spouse Receives Help From: Family Type of Home: House Home Layout: One level Home Access: Level entry Home Adaptive Equipment: Straight cane Prior Function Level of Independence: Requires assistive device for independence;Independent with basic ADLs Cognition Cognition Arousal/Alertness: Awake/alert Overall Cognitive Status: Appears within functional limits for tasks assessed Orientation Level: Oriented X4 Sensation/Coordination Coordination Gross Motor Movements are Fluid and Coordinated: Yes Extremity Assessment RLE Assessment RLE Assessment: Within Functional Limits LLE Assessment LLE Assessment: Within Functional Limits Mobility (including Balance) Bed Mobility Bed Mobility: Yes Supine to Sit: 3: Mod assist;HOB elevated (Comment degrees) Supine to Sit Details (indicate cue type and reason): Assist for trunk to upright. Increased time.  Sit to Supine - Left: 3: Mod assist;HOB elevated (comment degrees) Sit to Supine - Left  Details (indicate cue type and reason): Assist for LEs onto bed. Increased time.  Transfers Transfers: Yes Sit to Stand: From elevated surface;4: Min assist;From bed;With upper extremity assist Sit to Stand Details (indicate cue type and reason): VCs safety, technique, hand placement. Pt prefers to have 1 forearm on front of walker for more leverage to rise.  Stand to Sit: 4: Min assist Stand to Sit Details: VCs safety, hand placement. Uncontrolled descent.  Ambulation/Gait Ambulation/Gait: Yes Ambulation Distance (Feet): 55 Feet Assistive device: Rolling walker Gait Pattern: Step-through pattern    Exercise    End of Session PT - End of Session Equipment Utilized During Treatment: Gait belt Activity Tolerance: Patient limited by fatigue Patient left: in bed;with call bell in reach;with family/visitor present General Behavior During Session: [facility] for tasks performed Cognition: [facility] for tasks performed  Rebeca Alert [facility] 05/07/2011, 4:45 PM [phone]

## 2011-05-07 NOTE — Progress Notes (Signed)
ANTIBIOTIC CONSULT NOTE - INITIAL  Pharmacy Consult for Vancomycin  Indication:  Urine culture positive for Staph  Allergies  Allergen Reactions  . Cefuroxime Other (See Comments)    REACTION: unkown reaction  . Donepezil Diarrhea  . Heparin Itching  . Hydralazine Other (See Comments)    DO NOT TAKE PER MD  . Hydromorphone Itching  . Metoprolol Other (See Comments)    unknown  . Morphine Itching    REACTION: itching  . Plavix (Clopidogrel Bisulfate) Other (See Comments)    GI BLEEDING  . Promethazine Other (See Comments)    DO NOT TAKE PER MD  . Sulfonamide Derivatives Itching  . Zaroxolyn (Metolazone) Diarrhea  . Ertapenem Itching and Rash    Patient Measurements: Height: 5\' 8"  (172.7 cm) Weight: 247 lb 12.8 oz (112.4 kg) IBW/kg (Calculated) : 68.4    Vital Signs: Temp: 98.4 F (36.9 C) (12/13 1447) Temp src: Oral (12/13 1447) BP: 147/69 mmHg (12/13 1447) Pulse Rate: 79  (12/13 1447) Intake/Output from previous day: 12/12 0701 - 12/13 0700 In: 550 [P.O.:300; IV Piggyback:250] Out: 1025 [Urine:1025] Intake/Output from this shift: Total I/O In: 480 [P.O.:480] Out: 700 [Urine:700]  Labs:  [facility] 05/07/11 0444 05/06/11 0456 05/05/11 0940  WBC 9.0 8.2 10.5  HGB 9.4* 8.9* 9.3*  PLT 211 197 180  LABCREA -- -- --  CREATININE 1.67* 1.64* 1.66*   Estimated Creatinine Clearance: 46.5 ml/min (by C-G formula based on Cr of 1.67). No results found for this basename: VANCOTROUGH:2,VANCOPEAK:2,VANCORANDOM:2,GENTTROUGH:2,GENTPEAK:2,GENTRANDOM:2,TOBRATROUGH:2,TOBRAPEAK:2,TOBRARND:2,AMIKACINPEAK:2,AMIKACINTROU:2,AMIKACIN:2, in the last 72 hours   Microbiology: Recent Results (from the past 720 hour(s))  CULTURE, BLOOD (ROUTINE X 2)     Status: Normal   Collection Time   05/02/11  7:55 AM      Component Value Range Status Comment   Specimen Description BLOOD RIGHT ARM   Final    Special Requests BOTTLES DRAWN AEROBIC AND ANAEROBIC 5CC EACH   Final    Setup Time  [id number]   Final    Culture     Final    Value: STAPHYLOCOCCUS SPECIES (COAGULASE NEGATIVE)     Note: THE SIGNIFICANCE OF ISOLATING THIS ORGANISM FROM A SINGLE SET OF BLOOD CULTURES WHEN MULTIPLE SETS ARE DRAWN IS UNCERTAIN. PLEASE NOTIFY THE MICROBIOLOGY DEPARTMENT WITHIN ONE WEEK IF SPECIATION AND SENSITIVITIES ARE REQUIRED.     Note: Gram Stain Report Called to,Read Back By and Verified With: Teryl Lucy RN on 05/03/11 at 06:35 by Christie Nottingham   Report Status 05/04/2011 FINAL   Final   CULTURE, BLOOD (ROUTINE X 2)     Status: Normal (Preliminary result)   Collection Time   05/02/11  8:06 AM      Component Value Range Status Comment   Specimen Description BLOOD LEFT HAND   Final    Special Requests BOTTLES DRAWN AEROBIC AND ANAEROBIC [facility] EACH   Final    Setup Time [id number]   Final    Culture     Final    Value:        BLOOD CULTURE RECEIVED NO GROWTH TO DATE CULTURE WILL BE HELD FOR 5 DAYS BEFORE ISSUING A FINAL NEGATIVE REPORT   Report Status PENDING   Incomplete   URINE CULTURE     Status: Normal   Collection Time   05/02/11 10:49 AM      Component Value Range Status Comment   Specimen Description URINE, CATHETERIZED   Final    Special Requests Normal   Final    Setup Time [id number]  Final    Colony Count 50,000 COLONIES/ML   Final    Culture     Final    Value: STAPHYLOCOCCUS SPECIES (COAGULASE NEGATIVE)     Note: RIFAMPIN AND GENTAMICIN SHOULD NOT BE USED AS SINGLE DRUGS FOR TREATMENT OF STAPH INFECTIONS.   Report Status 05/04/2011 FINAL   Final    Organism ID, Bacteria STAPHYLOCOCCUS SPECIES (COAGULASE NEGATIVE)   Final   MRSA PCR SCREENING     Status: Abnormal   Collection Time   05/02/11  5:50 PM      Component Value Range Status Comment   MRSA by PCR POSITIVE (*) NEGATIVE  Final     Medical History: Past Medical History  Diagnosis Date  . Adenomatous colon polyp   . Diverticulosis   . GERD (gastroesophageal reflux disease)   . Hiatal hernia   .  Alzheimer disease   . Gout   . Renal insufficiency   . DM (diabetes mellitus)   . Asthma with bronchitis   . COPD (chronic obstructive pulmonary disease)   . Chronic hypoxemic respiratory failure   . OSA (obstructive sleep apnea)   . CHF (congestive heart failure)   . Blood transfusion   . Arthritis   . Myocardial infarction   . Pneumonia   . Anemia     Medications:  Scheduled:    . albuterol  2.5 mg Nebulization Q6H WA  . allopurinol  300 mg Oral Daily  . aspirin EC  325 mg Oral Daily  . Chlorhexidine Gluconate Cloth  6 each Topical Q0600  . chlorpheniramine-HYDROcodone  5 mL Oral QHS  . colchicine  0.6 mg Oral Daily  . diclofenac  1 drop Both Eyes BID  . escitalopram  10 mg Oral Daily  . ezetimibe  10 mg Oral Daily  . ferrous sulfate  325 mg Oral BID WC  . folic acid  0.5 mg Oral Daily  . furosemide  40 mg Oral Daily  . gabapentin  100 mg Oral QHS  . insulin aspart  0-15 Units Subcutaneous TID WC  . insulin glargine  53 Units Subcutaneous QAC breakfast  . insulin glargine  56 Units Subcutaneous Q24H  . ipratropium  0.5 mg Nebulization Q6H WA  . isosorbide mononitrate  30 mg Oral Daily  . lisinopril  20 mg Oral Daily  . moxifloxacin  400 mg Intravenous Q24H  . mupirocin ointment  1 application Nasal BID  . pantoprazole  40 mg Oral Q1200  . predniSONE  5 mg Oral Q breakfast  . simvastatin  40 mg Oral Daily  . DISCONTD: furosemide  10 mg Intravenous Once  . DISCONTD: furosemide  20 mg Intravenous BID  . DISCONTD: pantoprazole (PROTONIX) IV  40 mg Intravenous Q24H   Infusions:    . DISCONTD: sodium chloride 500 mL (05/05/11 1425)   Assessment: 77 yo male admitted with bacteremia- now  Staph positive urine culture.  Goal of Therapy:  Vancomycin trough level 10-15 mcg/ml  Plan:  Vancomycin 1500 mg IV q24h. CrCl~38 (N) F/U SCr/levels as needed.   Lorenza Evangelist 05/07/2011,3:54 PM

## 2011-05-07 NOTE — Progress Notes (Signed)
Patient ID: LOUISE VICTORY, male   DOB: 04-15-1934, 77 y.o.   MRN: [id number]  Subjective: No events overnight. Patient denies chest pain, shortness of breath, abdominal pain. Had bowel movement and reports ambulating.  Objective:  Vital signs in last 24 hours:  Filed Vitals:   05/07/11 1447 05/07/11 1459 05/07/11 2103 05/07/11 2125  BP: 147/69   160/59  Pulse: 79   78  Temp: 98.4 F (36.9 C)   98 F (36.7 C)  TempSrc: Oral   Oral  Resp: 22   24  Height:      Weight:      SpO2: 91% 95% 92% 99%    Intake/Output from previous day:   Intake/Output Summary (Last 24 hours) at 05/07/11 2312 Last data filed at 05/07/11 1448  Gross per 24 hour  Intake    480 ml  Output   1100 ml  Net   -620 ml    Physical Exam: General: Alert, awake, oriented x3, in no acute distress. HEENT: No bruits, no goiter. Moist mucous membranes, no scleral icterus, no conjunctival pallor. Heart: Regular rate and rhythm, without murmurs, rubs, gallops. Lungs: Clear to auscultation bilaterally. No wheezing, no rhonchi, no rales.  Abdomen: Soft, nontender, nondistended, positive bowel sounds. Extremities: No clubbing cyanosis or edema,  positive pedal pulses. Neuro: Grossly intact, nonfocal.    Lab Results:  Basic Metabolic Panel:    Component Value Date/Time   NA 139 05/07/2011 0444   K 4.4 05/07/2011 0444   CL 106 05/07/2011 0444   CO2 23 05/07/2011 0444   BUN 29* 05/07/2011 0444   CREATININE 1.67* 05/07/2011 0444   GLUCOSE 94 05/07/2011 0444   CALCIUM 9.6 05/07/2011 0444   CALCIUM 7.9* 08/09/2008 0350   CBC:    Component Value Date/Time   WBC 9.0 05/07/2011 0444   HGB 9.4* 05/07/2011 0444   HCT 31.4* 05/07/2011 0444   PLT 211 05/07/2011 0444   MCV 84.6 05/07/2011 0444   NEUTROABS 12.6* 05/02/2011 0811   LYMPHSABS 2.2 05/02/2011 0811   MONOABS 1.2* 05/02/2011 0811   EOSABS 0.5 05/02/2011 0811   BASOSABS 0.0 05/02/2011 0811      Lab 05/07/11 0444 05/06/11 0456 05/05/11 0940 05/04/11  0452 05/03/11 0510 05/02/11 0811  WBC 9.0 8.2 10.5 11.9* 20.4* --  HGB 9.4* 8.9* 9.3* 6.7* 7.9* --  HCT 31.4* 29.6* 30.4* 22.4* 26.5* --  PLT 211 197 180 189 213 --  MCV 84.6 84.1 83.5 83.0 83.6 --  MCH 25.3* 25.3* 25.5* 24.8* 24.9* --  MCHC 29.9* 30.1 30.6 29.9* 29.8* --  RDW 17.8* 17.9* 17.6* 18.6* 18.5* --  LYMPHSABS -- -- -- -- -- 2.2  MONOABS -- -- -- -- -- 1.2*  EOSABS -- -- -- -- -- 0.5  BASOSABS -- -- -- -- -- 0.0  BANDABS -- -- -- -- -- --    Lab 05/07/11 0444 05/06/11 0456 05/05/11 0940 05/04/11 0452 05/03/11 0510  NA 139 141 141 137 139  K 4.4 4.4 4.8 4.9 4.9  CL 106 108 109 106 104  CO2 23 24 22 22 25   GLUCOSE 94 106* 111* 231* 121*  BUN 29* 32* 42* 69* 84*  CREATININE 1.67* 1.64* 1.66* 2.45* 3.56*  CALCIUM 9.6 9.5 9.6 9.1 9.4  MG -- -- -- -- --   No results found for this basename: INR:5,PROTIME:5 in the last 168 hours Cardiac markers:  Lab 05/02/11 0812  CKMB 3.0  TROPONINI <0.30  MYOGLOBIN --   No components  found with this basename: POCBNP:3 Recent Results (from the past 240 hour(s))  CULTURE, BLOOD (ROUTINE X 2)     Status: Normal   Collection Time   05/02/11  7:55 AM      Component Value Range Status Comment   Specimen Description BLOOD RIGHT ARM   Final    Special Requests BOTTLES DRAWN AEROBIC AND ANAEROBIC 5CC EACH   Final    Setup Time [id number]   Final    Culture     Final    Value: STAPHYLOCOCCUS SPECIES (COAGULASE NEGATIVE)     Note: THE SIGNIFICANCE OF ISOLATING THIS ORGANISM FROM A SINGLE SET OF BLOOD CULTURES WHEN MULTIPLE SETS ARE DRAWN IS UNCERTAIN. PLEASE NOTIFY THE MICROBIOLOGY DEPARTMENT WITHIN ONE WEEK IF SPECIATION AND SENSITIVITIES ARE REQUIRED.     Note: Gram Stain Report Called to,Read Back By and Verified With: Teryl Lucy RN on 05/03/11 at 06:35 by Christie Nottingham   Report Status 05/04/2011 FINAL   Final   CULTURE, BLOOD (ROUTINE X 2)     Status: Normal (Preliminary result)   Collection Time   05/02/11  8:06 AM      Component  Value Range Status Comment   Specimen Description BLOOD LEFT HAND   Final    Special Requests BOTTLES DRAWN AEROBIC AND ANAEROBIC [facility] 2 EACH   Final    Setup Time [id number]   Final    Culture     Final    Value:        BLOOD CULTURE RECEIVED NO GROWTH TO DATE CULTURE WILL BE HELD FOR 5 DAYS BEFORE ISSUING A FINAL NEGATIVE REPORT   Report Status PENDING   Incomplete   URINE CULTURE     Status: Normal   Collection Time   05/02/11 10:49 AM      Component Value Range Status Comment   Specimen Description URINE, CATHETERIZED   Final    Special Requests Normal   Final    Setup Time [id number]   Final    Colony Count 50,000 COLONIES/ML   Final    Culture     Final    Value: STAPHYLOCOCCUS SPECIES (COAGULASE NEGATIVE)     Note: RIFAMPIN AND GENTAMICIN SHOULD NOT BE USED AS SINGLE DRUGS FOR TREATMENT OF STAPH INFECTIONS.   Report Status 05/04/2011 FINAL   Final    Organism ID, Bacteria STAPHYLOCOCCUS SPECIES (COAGULASE NEGATIVE)   Final   MRSA PCR SCREENING     Status: Abnormal   Collection Time   05/02/11  5:50 PM      Component Value Range Status Comment   MRSA by PCR POSITIVE (*) NEGATIVE  Final     Studies/Results: No results found.  Medications: Scheduled Meds:   . albuterol  2.5 mg Nebulization Q6H WA  . allopurinol  300 mg Oral Daily  . aspirin EC  325 mg Oral Daily  . Chlorhexidine Gluconate Cloth  6 each Topical Q0600  . chlorpheniramine-HYDROcodone  5 mL Oral QHS  . colchicine  0.6 mg Oral Daily  . diclofenac  1 drop Both Eyes BID  . escitalopram  10 mg Oral Daily  . ezetimibe  10 mg Oral Daily  . ferrous sulfate  325 mg Oral BID WC  . folic acid  0.5 mg Oral Daily  . furosemide  40 mg Oral Daily  . gabapentin  100 mg Oral QHS  . insulin aspart  0-15 Units Subcutaneous TID WC  . insulin glargine  53 Units Subcutaneous QAC breakfast  . insulin  glargine  56 Units Subcutaneous Q24H  . ipratropium  0.5 mg Nebulization Q6H WA  . isosorbide mononitrate  30 mg Oral Daily    . lisinopril  20 mg Oral Daily  . methocarbamol  500 mg Oral BID  . moxifloxacin  400 mg Intravenous Q24H  . mupirocin ointment  1 application Nasal BID  . pantoprazole  40 mg Oral Q1200  . predniSONE  5 mg Oral Q breakfast  . simvastatin  40 mg Oral Daily  . vancomycin  1,500 mg Intravenous Q24H   Continuous Infusions:  PRN Meds:.acetaminophen, albuterol, magic mouthwash, nitroGLYCERIN, DISCONTD: methocarbamol  Assessment/Plan:  Anemia  Positive guaic stools  S/p Blood transfusion  H&H are now stable and at pt's baseline  CBC in AM   GI Bleed  Secondary to AVM per endoscopy results  Will continue PPI  Per GI we may also continue ASA and low dose Prednisone 5 mg PO QD  .  Bacteremia  1/2 bottles growing Coagulase negative staph  Urine culture also growing coagulase negative staph.  ID has discontinued the vancomycin.  However, no with Left lung base infiltrate, continue Avalox   Acute Diastolic dysfunction  Will change lasix to PO   Acute on CKD:  BUN/Cr improved.  Now at pt's baseline  BMP in AM   Diabetes Mellitus  Glucose well controlled  Continue Lantus BID  Continue SSI.  Check A1C   OBSTRUCTIVE SLEEP APNEA  Cont Bipap.   CORONARY ARTERY DISEASE  Cont Aspirin, zetia, isosorbide.   Hypertension  Will restart Lisinopril 20 mg po daily.   Disposition  - plan of care and diagnosis, diagnostic studies and test results were discussed with pt and family at bedside  - pt and family verbalized understanding     LOS: 5 days   MAGICK-Aniya Jolicoeur 05/07/2011, 11:12 PM

## 2011-05-07 NOTE — Progress Notes (Signed)
Physical Therapy Note  Order received. Chart reviewed. Pt is currently on strict bed rest. MD please update pt's activity level in orders for participation with physical therapy. Thanks.

## 2011-05-07 NOTE — Op Note (Signed)
[facility] [address] [place], [place]  [address]  ENDOSCOPY PROCEDURE REPORT  PATIENT:  [name], [name]  MR#:  [id number] BIRTHDATE:  February 08, 1934, 77 yrs. old  GENDER:  ENDOSCOPIST:  Barbette Hair. Arlyce Dice, MD Referred by:  PROCEDURE DATE:  05/05/2011 PROCEDURE:  EGD with ablation of tumor ASA CLASS:  Class II INDICATIONS:  hemorrhage of GI tract  MEDICATIONS:   There was residual sedation effect present from prior procedure. TOPICAL ANESTHETIC:  DESCRIPTION OF PROCEDURE:   After the risks and benefits of the procedure were explained, informed consent was obtained.  The endoscope was introduced through the mouth and advanced to the third portion of the duodenum.  The instrument was slowly withdrawn as the mucosa was fully examined. 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 Single 2 mm nonbleeding AVM. AVM was obliterated utilizing the argon plasma coagulator (see image7 and image8).  There were multiple polyps identified (see image5). There were multiple 1-2 mm sessile fundic appearing polyps in the gastric fundus  Esophagitis was found at the gastroesophageal junction. Esophagitis with erosions was present at the GE junction (see image11).  Otherwise the examination was normal (see image1, image2, image3, image6, and image12).    Retroflexed views revealed no abnormalities.    The scope was then withdrawn from the patient and the procedure completed.  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  RECOMMENDATIONS: 1) continue PPI 2) iron supplementation  ______________________________ Barbette Hair. Arlyce Dice, MD  CC:  n. eSIGNED:   Barbette Hair. Kaplan at 05/05/2011 04:00 PM  Tish Men, [phone]

## 2011-05-08 LAB — BASIC METABOLIC PANEL
BUN: 30 mg/dL — ABNORMAL HIGH (ref 6–23)
CO2: 26 mEq/L (ref 19–32)
Chloride: 106 mEq/L (ref 96–112)
Creatinine, Ser: 1.59 mg/dL — ABNORMAL HIGH (ref 0.50–1.35)
GFR calc Af Amer: 47 mL/min — ABNORMAL LOW (ref >90)
Glucose, Bld: 81 mg/dL (ref 70–99)
Potassium: 4.1 mEq/L (ref 3.5–5.1)

## 2011-05-08 LAB — CBC
HCT: 29.2 % — ABNORMAL LOW (ref 39.0–52.0)
Hemoglobin: 9 g/dL — ABNORMAL LOW (ref 13.0–17.0)
MCH: 26.4 pg (ref 26.0–34.0)
MCHC: 30.8 g/dL (ref 30.0–36.0)
MCV: 85.6 fL (ref 78.0–100.0)
Platelets: 197 10*3/uL (ref 150–400)
RBC: 3.41 MIL/uL — ABNORMAL LOW (ref 4.22–5.81)
RDW: 17.7 % — ABNORMAL HIGH (ref 11.5–15.5)
WBC: 5.5 10*3/uL (ref 4.0–10.5)

## 2011-05-08 LAB — GLUCOSE, CAPILLARY
Glucose-Capillary: 75 mg/dL (ref 70–99)
Glucose-Capillary: 127 mg/dL — ABNORMAL HIGH (ref 70–99)
Glucose-Capillary: 182 mg/dL — ABNORMAL HIGH (ref 70–99)

## 2011-05-08 LAB — CULTURE, BLOOD (ROUTINE X 2)

## 2011-05-08 MED ORDER — MOXIFLOXACIN HCL 400 MG PO TABS
400.0000 mg | ORAL_TABLET | Freq: Every day | ORAL | Status: DC
  Administered 2011-05-08: 400 mg via ORAL
  Filled 2011-05-08 (×3): qty 1

## 2011-05-08 MED ORDER — METHOCARBAMOL 500 MG PO TABS
500.0000 mg | ORAL_TABLET | Freq: Every day | ORAL | Status: DC
  Filled 2011-05-08 (×2): qty 1

## 2011-05-08 NOTE — Progress Notes (Signed)
Physical Therapy Treatment Patient Details Name: [name] MRN: [id number] DOB: January 11, 1934 Today's Date: 05/08/2011 Time: 4098-1191 Charge: [name] PT Assessment/Plan  PT - Assessment/Plan Comments on Treatment Session: Pt ambulated short distance in hall today and able to tolerate seated exercises.  Recommend RW and HHPT.  Pt and family report he will have increased family assist available upon D/C. PT Plan: Frequency remains appropriate;Discharge plan remains appropriate Equipment Recommended: Rolling walker with 5" wheels PT Goals  Acute Rehab PT Goals PT Goal: Sit to Stand - Progress: Progressing toward goal PT Goal: Stand to Sit - Progress: Progressing toward goal PT Goal: Ambulate - Progress: Progressing toward goal  PT Treatment Precautions/Restrictions  Restrictions Weight Bearing Restrictions: No Mobility (including Balance) Bed Mobility Bed Mobility: No Transfers Transfers: Yes Sit to Stand: 4: Min assist;With upper extremity assist;From chair/3-in-1 Sit to Stand Details (indicate cue type and reason): assist to rise, verbal cues for hand placement Stand to Sit: 4: Min assist;To chair/3-in-1 Stand to Sit Details:  verbal cues to use armrests to control descent but pt did not demonstrate and held onto RW, assist to control descent Ambulation/Gait Ambulation/Gait: Yes Ambulation/Gait Assistance: 4: Min assist Ambulation/Gait Assistance Details (indicate cue type and reason): min/guard, verbal cues for safe RW distance, presents with decreased motion throughout gait in L knee, SaO2 98% on RA pre-gait and 87% post-gait RA so pt placed back on O2 [name]. Ambulation Distance (Feet): 50 Feet Assistive device: Rolling walker Gait Pattern: Step-to pattern;Left flexed knee in stance;Decreased stance time - left    Exercise  General Exercises - Lower Extremity Ankle Circles/Pumps: AROM;Both;20 reps;Seated Long Arc Quad: Strengthening;AROM;Both;20 reps;Seated Hip  Flexion/Marching: AROM;Strengthening;Both;Seated (20 reps) Other Exercises Other Exercises: sit <-> stands x10, with use of armrests, min/guard assist End of Session PT - End of Session Activity Tolerance: Patient limited by pain Patient left: in chair;with family/visitor present General Behavior During Session: [name] for tasks performed Cognition: [name] for tasks performed  [name],[name] 05/08/2011, 3:10 PM Pager: [phone]

## 2011-05-08 NOTE — Progress Notes (Signed)
Pt urinated 100cc 7p-7a. Bladder scanned pt. Scanned showed 0ml.  Benedetto Coons, NP notified.  No new orders at this time.  Will continue to monitor. [place]

## 2011-05-08 NOTE — Progress Notes (Signed)
Patient will not use our Bipap machine. Family says it is not the same as his unit at home. Instructed family to bring his in and we will have biomed check it out.

## 2011-05-08 NOTE — Progress Notes (Signed)
Patient ID: [name], male   DOB: May 14, 1934, 77 y.o.   MRN: [id number]   Subjective: No events overnight. Patient denies chest pain, shortness of breath, abdominal pain. Had bowel movement and reports ambulating.  Objective:  Vital signs in last 24 hours:  Filed Vitals:   05/08/11 1013 05/08/11 1026 05/08/11 1339 05/08/11 1629  BP: 152/65  169/66   Pulse:   77   Temp:   98.2 F (36.8 C)   TempSrc:   Oral   Resp:   20   Height:      Weight:      SpO2:  98% 95% 95%    Intake/Output from previous day:   Intake/Output Summary (Last 24 hours) at 05/08/11 1743 Last data filed at 05/08/11 1726  Gross per 24 hour  Intake   1220 ml  Output    900 ml  Net    320 ml    Physical Exam: General: Alert, awake, oriented x3, in no acute distress. HEENT: No bruits, no goiter. Moist mucous membranes, no scleral icterus, no conjunctival pallor. Heart: Regular rate and rhythm, without murmurs, rubs, gallops. Lungs: Clear to auscultation bilaterally with minimal bibasilar crackles. No wheezing, no rhonchi, no rales.  Abdomen: Soft, nontender, nondistended, positive bowel sounds. Extremities: No clubbing cyanosis or edema,  positive pedal pulses. Neuro: Grossly intact, nonfocal.  Lab Results:  Basic Metabolic Panel:    Component Value Date/Time   NA 140 05/08/2011 0447   K 4.1 05/08/2011 0447   CL 106 05/08/2011 0447   CO2 26 05/08/2011 0447   BUN 30* 05/08/2011 0447   CREATININE 1.59* 05/08/2011 0447   GLUCOSE 81 05/08/2011 0447   CALCIUM 9.3 05/08/2011 0447   CALCIUM 7.9* 08/09/2008 0350   CBC:    Component Value Date/Time   WBC 5.5 05/08/2011 0447   HGB 9.0* 05/08/2011 0447   HCT 29.2* 05/08/2011 0447   PLT 197 05/08/2011 0447   MCV 85.6 05/08/2011 0447   NEUTROABS 12.6* 05/02/2011 0811   LYMPHSABS 2.2 05/02/2011 0811   MONOABS 1.2* 05/02/2011 0811   EOSABS 0.5 05/02/2011 0811   BASOSABS 0.0 05/02/2011 0811      Lab 05/08/11 0447 05/07/11 0444 05/06/11 0456  05/05/11 0940 05/04/11 0452 05/02/11 0811  WBC 5.5 9.0 8.2 10.5 11.9* --  HGB 9.0* 9.4* 8.9* 9.3* 6.7* --  HCT 29.2* 31.4* 29.6* 30.4* 22.4* --  PLT 197 211 197 180 189 --  MCV 85.6 84.6 84.1 83.5 83.0 --  MCH 26.4 25.3* 25.3* 25.5* 24.8* --  MCHC 30.8 29.9* 30.1 30.6 29.9* --  RDW 17.7* 17.8* 17.9* 17.6* 18.6* --  LYMPHSABS -- -- -- -- -- 2.2  MONOABS -- -- -- -- -- 1.2*  EOSABS -- -- -- -- -- 0.5  BASOSABS -- -- -- -- -- 0.0  BANDABS -- -- -- -- -- --    Lab 05/08/11 0447 05/07/11 0444 05/06/11 0456 05/05/11 0940 05/04/11 0452  NA 140 139 141 141 137  K 4.1 4.4 4.4 4.8 4.9  CL 106 106 108 109 106  CO2 26 23 24 22 22   GLUCOSE 81 94 106* 111* 231*  BUN 30* 29* 32* 42* 69*  CREATININE 1.59* 1.67* 1.64* 1.66* 2.45*  CALCIUM 9.3 9.6 9.5 9.6 9.1  MG -- -- -- -- --   No results found for this basename: INR:5,PROTIME:5 in the last 168 hours Cardiac markers:  Lab 05/02/11 0812  CKMB 3.0  TROPONINI <0.30  MYOGLOBIN --  Recent Results (from the past 240 hour(s))  CULTURE, BLOOD (ROUTINE X 2)     Status: Normal   Collection Time   05/02/11  7:55 AM      Component Value Range Status Comment   Specimen Description BLOOD RIGHT ARM   Final    Special Requests BOTTLES DRAWN AEROBIC AND ANAEROBIC 5CC EACH   Final    Setup Time [id number]   Final    Culture     Final    Value: STAPHYLOCOCCUS SPECIES (COAGULASE NEGATIVE)     Note: THE SIGNIFICANCE OF ISOLATING THIS ORGANISM FROM A SINGLE SET OF BLOOD CULTURES WHEN MULTIPLE SETS ARE DRAWN IS UNCERTAIN. PLEASE NOTIFY THE MICROBIOLOGY DEPARTMENT WITHIN ONE WEEK IF SPECIATION AND SENSITIVITIES ARE REQUIRED.     Note: Gram Stain Report Called to,Read Back By and Verified With: Teryl Lucy RN on 05/03/11 at 06:35 by Christie Nottingham   Report Status 05/04/2011 FINAL   Final   CULTURE, BLOOD (ROUTINE X 2)     Status: Normal   Collection Time   05/02/11  8:06 AM      Component Value Range Status Comment   Specimen Description BLOOD LEFT HAND    Final    Special Requests BOTTLES DRAWN AEROBIC AND ANAEROBIC [facility]   Final    Setup Time [id number]   Final    Culture NO GROWTH 5 DAYS   Final    Report Status 05/08/2011 FINAL   Final   URINE CULTURE     Status: Normal   Collection Time   05/02/11 10:49 AM      Component Value Range Status Comment   Specimen Description URINE, CATHETERIZED   Final    Special Requests Normal   Final    Setup Time [id number]   Final    Colony Count 50,000 COLONIES/ML   Final    Culture     Final    Value: STAPHYLOCOCCUS SPECIES (COAGULASE NEGATIVE)     Note: RIFAMPIN AND GENTAMICIN SHOULD NOT BE USED AS SINGLE DRUGS FOR TREATMENT OF STAPH INFECTIONS.   Report Status 05/04/2011 FINAL   Final    Organism ID, Bacteria STAPHYLOCOCCUS SPECIES (COAGULASE NEGATIVE)   Final   MRSA PCR SCREENING     Status: Abnormal   Collection Time   05/02/11  5:50 PM      Component Value Range Status Comment   MRSA by PCR POSITIVE (*) NEGATIVE  Final     Studies/Results: No results found.  Medications: Scheduled Meds:   . albuterol  2.5 mg Nebulization Q6H WA  . allopurinol  300 mg Oral Daily  . aspirin EC  325 mg Oral Daily  . chlorpheniramine-HYDROcodone  5 mL Oral QHS  . colchicine  0.6 mg Oral Daily  . diclofenac  1 drop Both Eyes BID  . escitalopram  10 mg Oral Daily  . ezetimibe  10 mg Oral Daily  . ferrous sulfate  325 mg Oral BID WC  . folic acid  0.5 mg Oral Daily  . furosemide  40 mg Oral Daily  . gabapentin  100 mg Oral QHS  . insulin aspart  0-15 Units Subcutaneous TID WC  . insulin glargine  53 Units Subcutaneous QAC breakfast  . insulin glargine  56 Units Subcutaneous Q24H  . ipratropium  0.5 mg Nebulization Q6H WA  . isosorbide mononitrate  30 mg Oral Daily  . lisinopril  20 mg Oral Daily  . methocarbamol  500 mg Oral QHS  . moxifloxacin  400 mg Oral q1800  . pantoprazole  40 mg Oral Q1200  . predniSONE  5 mg Oral Q breakfast  . simvastatin  40 mg Oral Daily  . DISCONTD:  methocarbamol  500 mg Oral BID  . DISCONTD: moxifloxacin  400 mg Intravenous Q24H  . DISCONTD: vancomycin  1,500 mg Intravenous Q24H   Continuous Infusions:  PRN Meds:.acetaminophen, albuterol, magic mouthwash, nitroGLYCERIN  Assessment/Plan:  Anemia  Positive guaic stools  S/p Blood transfusion  H&H are now stable and at pt's baseline  CBC in AM   GI Bleed  Secondary to AVM per endoscopy results  Will continue PPI  Per GI we may also continue ASA and low dose Prednisone 5 mg PO QD  Pt tolerating well .  Bacteremia  1/2 bottles growing Coagulase negative staph  Urine culture also growing coagulase negative staph.  ID has discontinued the vancomycin on 12/10   Acute Diastolic dysfunction  Will change lasix to PO  Monitor I's and O's  Acute on CKD:  BUN/Cr improved.  Now at pt's baseline  BMP in AM   Diabetes Mellitus  Glucose well controlled  Continue Lantus BID  Continue SSI.   OBSTRUCTIVE SLEEP APNEA  Cont Bipap.   CORONARY ARTERY DISEASE  Cont Aspirin, zetia, isosorbide.   Hypertension  Continue Lisinopril 20 mg po daily.   Disposition  - plan of care and diagnosis, diagnostic studies and test results were discussed with pt and family at bedside  - pt and family verbalized understanding  - anticipate d/c in 1-2 day - PT recommended PT but pt wants to go home with [facility] PT - all we need to do is place order before d/c for [facility] PT    LOS: 6 days   MAGICK-Karimah Winquist 05/08/2011, 5:43 PM

## 2011-05-09 LAB — CBC
MCH: 25.6 pg — ABNORMAL LOW (ref 26.0–34.0)
MCHC: 30.1 g/dL (ref 30.0–36.0)
MCV: 85 fL (ref 78.0–100.0)
Platelets: 227 10*3/uL (ref 150–400)
RBC: 3.2 MIL/uL — ABNORMAL LOW (ref 4.22–5.81)

## 2011-05-09 LAB — BASIC METABOLIC PANEL
CO2: 27 mEq/L (ref 19–32)
Calcium: 9.5 mg/dL (ref 8.4–10.5)
Creatinine, Ser: 1.52 mg/dL — ABNORMAL HIGH (ref 0.50–1.35)
GFR calc non Af Amer: 43 mL/min — ABNORMAL LOW (ref >90)

## 2011-05-09 LAB — GLUCOSE, CAPILLARY
Glucose-Capillary: 63 mg/dL — ABNORMAL LOW (ref 70–99)
Glucose-Capillary: 74 mg/dL (ref 70–99)

## 2011-05-09 MED ORDER — MOXIFLOXACIN HCL 400 MG PO TABS: 400.0000 mg | ORAL_TABLET | Freq: Every day | ORAL | Status: AC

## 2011-05-09 MED ORDER — FERROUS SULFATE 325 (65 FE) MG PO TABS: 325.0000 mg | ORAL_TABLET | Freq: Two times a day (BID) | ORAL | Status: DC

## 2011-05-09 NOTE — Progress Notes (Signed)
Subjective: Pt mentions that he feels better today.  Denies any fever, chills, abdominal discomfort.   Objective: Filed Vitals:   05/08/11 2038 05/08/11 2120 05/09/11 0533 05/09/11 1030  BP:  151/55 155/53   Pulse: 76 70 64   Temp:  97.5 F (36.4 C) 97.8 F (36.6 C)   TempSrc:  Axillary Oral   Resp: 20 20 20    Height:      Weight:   112.9 kg (248 lb 14.4 oz)   SpO2: 98% 95% 91% 94%   Weight change: 1 kg (2 lb 3.3 oz)  Intake/Output Summary (Last 24 hours) at 05/09/11 1247 Last data filed at 05/09/11 0900  Gross per 24 hour  Intake   1460 ml  Output   1850 ml  Net   -390 ml    General: Alert, awake, oriented x3, in no acute distress.  HEENT: No bruits, no goiter.  Heart: Regular rate and rhythm, without murmurs, rubs, gallops.  Lungs: Decrease breath sounds at bases, slight rhales, no wheeze Abdomen: Soft, nontender, nondistended, positive bowel sounds.  Neuro: Grossly intact, nonfocal.   Lab Results:  Basename 05/09/11 0407 05/08/11 0447  NA 141 140  K 4.2 4.1  CL 106 106  CO2 27 26  GLUCOSE 93 81  BUN 30* 30*  CREATININE 1.52* 1.59*  CALCIUM 9.5 9.3  MG -- --  PHOS -- --   No results found for this basename: AST:2,ALT:2,ALKPHOS:2,BILITOT:2,PROT:2,ALBUMIN:2 in the last 72 hours No results found for this basename: LIPASE:2,AMYLASE:2 in the last 72 hours  Basename 05/09/11 0407 05/08/11 0447  WBC 5.1 5.5  NEUTROABS -- --  HGB 8.2* 9.0*  HCT 27.2* 29.2*  MCV 85.0 85.6  PLT 227 197   No results found for this basename: CKTOTAL:3,CKMB:3,CKMBINDEX:3,TROPONINI:3 in the last 72 hours No components found with this basename: POCBNP:3 No results found for this basename: DDIMER:2 in the last 72 hours  Basename 05/07/11 0444  HGBA1C 7.4*   No results found for this basename: CHOL:2,HDL:2,LDLCALC:2,TRIG:2,CHOLHDL:2,LDLDIRECT:2 in the last 72 hours No results found for this basename: TSH,T4TOTAL,FREET3,T3FREE,THYROIDAB in the last 72 hours No results found for  this basename: VITAMINB12:2,FOLATE:2,FERRITIN:2,TIBC:2,IRON:2,RETICCTPCT:2 in the last 72 hours  Micro Results: Recent Results (from the past 240 hour(s))  CULTURE, BLOOD (ROUTINE X 2)     Status: Normal   Collection Time   05/02/11  7:55 AM      Component Value Range Status Comment   Specimen Description BLOOD RIGHT ARM   Final    Special Requests BOTTLES DRAWN AEROBIC AND ANAEROBIC 5CC EACH   Final    Setup Time [id number]   Final    Culture     Final    Value: STAPHYLOCOCCUS SPECIES (COAGULASE NEGATIVE)     Note: THE SIGNIFICANCE OF ISOLATING THIS ORGANISM FROM A SINGLE SET OF BLOOD CULTURES WHEN MULTIPLE SETS ARE DRAWN IS UNCERTAIN. PLEASE NOTIFY THE MICROBIOLOGY DEPARTMENT WITHIN ONE WEEK IF SPECIATION AND SENSITIVITIES ARE REQUIRED.     Note: Gram Stain Report Called to,Read Back By and Verified With: Teryl Lucy RN on 05/03/11 at 06:35 by Christie Nottingham   Report Status 05/04/2011 FINAL   Final   CULTURE, BLOOD (ROUTINE X 2)     Status: Normal   Collection Time   05/02/11  8:06 AM      Component Value Range Status Comment   Specimen Description BLOOD LEFT HAND   Final    Special Requests BOTTLES DRAWN AEROBIC AND ANAEROBIC [facility] EACH   Final  Setup Time [id number]   Final    Culture NO GROWTH 5 DAYS   Final    Report Status 05/08/2011 FINAL   Final   URINE CULTURE     Status: Normal   Collection Time   05/02/11 10:49 AM      Component Value Range Status Comment   Specimen Description URINE, CATHETERIZED   Final    Special Requests Normal   Final    Setup Time [id number]   Final    Colony Count 50,000 COLONIES/ML   Final    Culture     Final    Value: STAPHYLOCOCCUS SPECIES (COAGULASE NEGATIVE)     Note: RIFAMPIN AND GENTAMICIN SHOULD NOT BE USED AS SINGLE DRUGS FOR TREATMENT OF STAPH INFECTIONS.   Report Status 05/04/2011 FINAL   Final    Organism ID, Bacteria STAPHYLOCOCCUS SPECIES (COAGULASE NEGATIVE)   Final   MRSA PCR SCREENING     Status: Abnormal   Collection  Time   05/02/11  5:50 PM      Component Value Range Status Comment   MRSA by PCR POSITIVE (*) NEGATIVE  Final     Studies/Results: No results found.  Medications: I have reviewed the patient's current medications.   Acute otitis media/bronchitis:  Will plan on continuing patient on avelox to complete a 7 day course.  GI bleed:  Due to AVM.  Will have pt f/u with GI as outpatient  Anemia:  Stable no active bleeding currently.   OSA Stable f/u with PCP on D/C  Acute Kidney injury:  Improved  Will have pt f/u with PCP  DM: Stable  Disposition:  Pt is to finish antibiotic therapy and f/u with his primary care physician as outpatient.   LOS: 7 days   Penny Pia M.D.  [facility] 05/09/2011, 12:47 PM

## 2011-05-09 NOTE — Discharge Summary (Signed)
Admit date: 05/02/2011 Discharge date: 05/09/2011  Primary Care Physician:  Kaleen Mask, MD   Discharge Diagnoses:  Bronchitis/Anemia secondary to GI loss  No resolved problems to display.  Active Hospital Problems  Diagnoses Date Noted   . Acute posthemorrhagic anemia 05/05/2011   . Fever 05/03/2011   . Acute bronchitis 05/02/2011   . Diastolic CHF, chronic 05/02/2011   . BRONCHITIS, CHRONIC 07/07/2010   . OBSTRUCTIVE SLEEP APNEA 01/23/2010   . Chronic kidney disease, unspecified 05/31/2008   . CORONARY ARTERY DISEASE 05/31/2008   . DM 05/31/2008     Resolved Hospital Problems  Diagnoses Date Noted Date Resolved     DISCHARGE MEDICATION: Current Discharge Medication List    START taking these medications   Details  ferrous sulfate 325 (65 FE) MG tablet Take 1 tablet (325 mg total) by mouth 2 (two) times daily with a meal. Qty: 60 tablet, Refills: 1    moxifloxacin (AVELOX) 400 MG tablet Take 1 tablet (400 mg total) by mouth daily at 6 PM. Qty: 6 tablet, Refills: 0      CONTINUE these medications which have NOT CHANGED   Details  acetaminophen (TYLENOL) 650 MG CR tablet Take 1,300 mg by mouth 2 (two) times daily.     albuterol (PROVENTIL) (2.5 MG/3ML) 0.083% nebulizer solution Take 2.5 mg by nebulization every 6 (six) hours as needed. For shortness of breath    allopurinol (ZYLOPRIM) 300 MG tablet Take 1 tablet by mouth Twice daily.    aspirin 325 MG tablet Take 325 mg by mouth daily.      atorvastatin (LIPITOR) 20 MG tablet Take 1 tablet by mouth daily.    bumetanide (BUMEX) 2 MG tablet Take 1.5 tablets by mouth Twice daily.     colchicine 0.6 MG tablet Take 0.6 mg by mouth daily.     diclofenac (VOLTAREN) 0.1 % ophthalmic solution Place 1 drop into both eyes 2 (two) times daily.      escitalopram (LEXAPRO) 10 MG tablet Take 10 mg by mouth daily.      ezetimibe (ZETIA) 10 MG tablet Take 10 mg by mouth daily.      folic acid (FOLVITE) 400 MCG tablet  Take 400 mcg by mouth daily.      gabapentin (NEURONTIN) 100 MG tablet Take 100 mg by mouth at bedtime.      insulin glargine (LANTUS) 100 UNIT/ML injection Inject 53-56 Units into the skin 2 (two) times daily. 53 units in the morning and 56 units in the evening    isosorbide mononitrate (IMDUR) 30 MG 24 hr tablet Take 1 tablet by mouth daily.    lisinopril (PRINIVIL,ZESTRIL) 20 MG tablet Take 20 mg by mouth 2 (two) times daily.      Multiple Vitamin (MULTIVITAMIN) tablet Take 0.5 tablets by mouth 2 (two) times daily.     NOVOLOG FLEXPEN 100 UNIT/ML injection Inject 10-20 Units into the skin 3 (three) times daily. 10 units in the morning,20 units at noon, and 20 in the evening before meals    nystatin (MYCOSTATIN) 100000 UNIT/ML suspension Take 5 mLs by mouth 4 (four) times daily as needed. For thrush.     pantoprazole (PROTONIX) 40 MG tablet Take 40 mg by mouth daily.      predniSONE (DELTASONE) 5 MG tablet Take 5 mg by mouth daily.      tiotropium (SPIRIVA) 18 MCG inhalation capsule Place 18 mcg into inhaler and inhale daily.      albuterol (PROAIR HFA) 108 (90 BASE) MCG/ACT  inhaler Inhale 2 puffs into the lungs every 6 (six) hours as needed for wheezing. Qty: 1 Inhaler, Refills: 12    clobetasol cream (TEMOVATE) 0.05 % Apply 1 application topically 2 (two) times daily as needed. For rash.     nitroGLYCERIN (NITROSTAT) 0.4 MG SL tablet Place 0.4 mg under the tongue every 5 (five) minutes as needed. May repeat x 3     PRESCRIPTION MEDICATION Apply 1 application topically 2 (two) times daily as needed. For rash. (Fluticasone/Ketoconazole) topical compounded cream            Consults:     SIGNIFICANT DIAGNOSTIC STUDIES:  Dg Chest 2 View  05/04/2011  *RADIOLOGY REPORT*  Clinical Data: Shortness of breath.  CHEST - 2 VIEW  Comparison: 05/02/2011 and 11/20/2010.  Findings: Marked cardiomegaly and post mediastinotomy.  Pulmonary vascular prominence most notable centrally. Subtle  left base infiltrate/atelectasis.  This can be assessed on follow-up.  IMPRESSION: Subtle left base infiltrate/atelectasis.  Original Report Authenticated By: Fuller Canada, M.D.   Dg Chest Portable 1 View  05/02/2011  *RADIOLOGY REPORT*  Clinical Data: Fever and cough.  PORTABLE CHEST - 1 VIEW  Comparison: Chest 11/20/2010.  Findings: There is cardiomegaly but no pulmonary edema.  No focal airspace disease or pleural effusion.  No pneumothorax.  IMPRESSION: Cardiomegaly without acute disease.  Original Report Authenticated By: Bernadene Bell. D'ALESSIO, M.D.     ECHO:None this admission     CARDIAC CATH & OTHER PROCEDURES: Upper endoscopy and flexible sigmoidoscopy, please refer to chart for further review.    Recent Results (from the past 240 hour(s))  CULTURE, BLOOD (ROUTINE X 2)     Status: Normal   Collection Time   05/02/11  7:55 AM      Component Value Range Status Comment   Specimen Description BLOOD RIGHT ARM   Final    Special Requests BOTTLES DRAWN AEROBIC AND ANAEROBIC 5CC EACH   Final    Setup Time [id number]   Final    Culture     Final    Value: STAPHYLOCOCCUS SPECIES (COAGULASE NEGATIVE)     Note: THE SIGNIFICANCE OF ISOLATING THIS ORGANISM FROM A SINGLE SET OF BLOOD CULTURES WHEN MULTIPLE SETS ARE DRAWN IS UNCERTAIN. PLEASE NOTIFY THE MICROBIOLOGY DEPARTMENT WITHIN ONE WEEK IF SPECIATION AND SENSITIVITIES ARE REQUIRED.     Note: Gram Stain Report Called to,Read Back By and Verified With: Teryl Lucy RN on 05/03/11 at 06:35 by Christie Nottingham   Report Status 05/04/2011 FINAL   Final   CULTURE, BLOOD (ROUTINE X 2)     Status: Normal   Collection Time   05/02/11  8:06 AM      Component Value Range Status Comment   Specimen Description BLOOD LEFT HAND   Final    Special Requests BOTTLES DRAWN AEROBIC AND ANAEROBIC [facility]   Final    Setup Time [id number]   Final    Culture NO GROWTH 5 DAYS   Final    Report Status 05/08/2011 FINAL   Final   URINE CULTURE     Status:  Normal   Collection Time   05/02/11 10:49 AM      Component Value Range Status Comment   Specimen Description URINE, CATHETERIZED   Final    Special Requests Normal   Final    Setup Time [id number]   Final    Colony Count 50,000 COLONIES/ML   Final    Culture     Final    Value: STAPHYLOCOCCUS  SPECIES (COAGULASE NEGATIVE)     Note: RIFAMPIN AND GENTAMICIN SHOULD NOT BE USED AS SINGLE DRUGS FOR TREATMENT OF STAPH INFECTIONS.   Report Status 05/04/2011 FINAL   Final    Organism ID, Bacteria STAPHYLOCOCCUS SPECIES (COAGULASE NEGATIVE)   Final   MRSA PCR SCREENING     Status: Abnormal   Collection Time   05/02/11  5:50 PM      Component Value Range Status Comment   MRSA by PCR POSITIVE (*) NEGATIVE  Final     BRIEF ADMITTING H & P: Pt is a 77 y/o that was admitted secondary to Acute Bronchitis.  While admitted he was also found to have heme positive FOB and GI was asked to follow patient.  While at the hospital he was started on IV antibiotics and was eventually switched to avelox.  Prior to the switch patient had one blood culture which tested positive for gram positives in clusters.  But after ID evaluation it was suspected that the positive culture was a contaminant and thus the recommendations to d/c IV vanc and start po avelox were made.  Patient was afebrile and wbc within normal limits on avelox.  Pt is to f/u with his PCP for hemoglobin and hematocrit f/u.  As well as finish all of his abx's.  Family is aware and agreeable.  No resolved problems to display.  Active Hospital Problems  Diagnoses Date Noted   . Acute posthemorrhagic anemia 05/05/2011   . Fever 05/03/2011   . Acute bronchitis 05/02/2011   . Diastolic CHF, chronic 05/02/2011   . BRONCHITIS, CHRONIC 07/07/2010   . OBSTRUCTIVE SLEEP APNEA 01/23/2010   . Chronic kidney disease, unspecified 05/31/2008   . CORONARY ARTERY DISEASE 05/31/2008   . DM 05/31/2008     Resolved Hospital Problems  Diagnoses Date Noted Date  Resolved     Disposition and Follow-up:   Pt is to f/u with his PCP for hemoglobin and hematocrit f/u.  As well as finish all of his abx's.  Family is aware and agreeable.  Also have tried to make arrangements for home physical therapy/occupational therapy to help patient at home. Discharge Orders    Future Appointments: Provider: Department: Dept Phone: Center:   10/08/2011 10:00 AM Waymon Budge, MD [facility] [phone] None     Future Orders Please Complete By Expires   Diet - low sodium heart healthy      Increase activity slowly      Discharge instructions      Comments:   Pt is to finish all antibiotics and follow up with PCP in 1 week.  At that point should get his H/H rechecked.  Also should f/u with his GI specialist.     Follow-up Information    Follow up with Kaleen Mask .          DISCHARGE EXAM:  Constitutional: Vital signs reviewed. Patient is a well-developed and well-nourished in no acute distress and cooperative with exam. Alert and oriented x3.  Head: Normocephalic and atraumatic  Mouth: no erythema or exudates, MM dry  Neck: Supple, Trachea midline normal ROM, No JVD, no neck stiffness  Cardiovascular: RRR, S1 normal, S2 normal, no MRG, pulses symmetric and intact bilaterally  Pulmonary/Chest: Decreased breath sounds at bases with no wheezes. Mild rhales at bases Abdominal: Soft. Non-tender, non-distended, bowel sounds are normal, no masses, organomegaly, or guarding present.  Neurological: A&O x3, Strenght is normal and symmetric bilaterally, cranial nerve II-XII are grossly intact, no focal motor  deficit, sensory intact to light touch bilaterally.  Skin: Warm, dry and intact. No rash, cyanosis, or clubbing.  Psychiatric: Normal mood and affect. speech and behavior is normal. Judgment and thought content normal. Cognition and memory are normal.    Blood pressure 155/53, pulse 64, temperature 97.8 F (36.6 C), temperature source Oral, resp.  rate 20, height 5\' 8"  (1.727 m), weight 112.9 kg (248 lb 14.4 oz), SpO2 94.00%.   Basename 05/09/11 0407 05/08/11 0447  NA 141 140  K 4.2 4.1  CL 106 106  CO2 27 26  GLUCOSE 93 81  BUN 30* 30*  CREATININE 1.52* 1.59*  CALCIUM 9.5 9.3  MG -- --  PHOS -- --   No results found for this basename: AST:2,ALT:2,ALKPHOS:2,BILITOT:2,PROT:2,ALBUMIN:2 in the last 72 hours No results found for this basename: LIPASE:2,AMYLASE:2 in the last 72 hours  Basename 05/09/11 0407 05/08/11 0447  WBC 5.1 5.5  NEUTROABS -- --  HGB 8.2* 9.0*  HCT 27.2* 29.2*  MCV 85.0 85.6  PLT 227 197    Signed: Penny Pia M.D. 05/09/2011, 3:14 PM

## 2011-05-09 NOTE — Progress Notes (Signed)
CM spoke with pt with wife and adult son at the bedside concerning d/c planning. Per MD order pt to d/c home with HHPT/OT. Per pt choice [facility] to provide home health services. [facility] notified of referral. Pt request rollator. DME to be delivered to pt's room prior to discharge. Roxy Manns Beatrice Sehgal,RN,BSN,Cm [phone]

## 2011-05-21 ENCOUNTER — Telehealth: Payer: Self-pay | Admitting: Internal Medicine

## 2011-05-21 NOTE — Telephone Encounter (Signed)
Patient is scheduled for 06/08/11 10:00

## 2011-06-08 ENCOUNTER — Encounter: Payer: Self-pay | Admitting: Internal Medicine

## 2011-06-08 ENCOUNTER — Ambulatory Visit (INDEPENDENT_AMBULATORY_CARE_PROVIDER_SITE_OTHER): Payer: BC Managed Care – PPO | Admitting: Internal Medicine

## 2011-06-08 VITALS — BP 130/50 | HR 60 | Ht 68.5 in | Wt 245.0 lb

## 2011-06-08 DIAGNOSIS — K31819 Angiodysplasia of stomach and duodenum without bleeding: Secondary | ICD-10-CM

## 2011-06-08 DIAGNOSIS — K5730 Diverticulosis of large intestine without perforation or abscess without bleeding: Secondary | ICD-10-CM

## 2011-06-08 DIAGNOSIS — K922 Gastrointestinal hemorrhage, unspecified: Secondary | ICD-10-CM

## 2011-06-08 DIAGNOSIS — Q2733 Arteriovenous malformation of digestive system vessel: Secondary | ICD-10-CM

## 2011-06-08 DIAGNOSIS — D62 Acute posthemorrhagic anemia: Secondary | ICD-10-CM

## 2011-06-08 NOTE — Progress Notes (Signed)
HISTORY OF PRESENT ILLNESS:  [name] is a 78 y.o. male with multiple significant medical problems as outlined below. Previous evaluations for anemia and Hemoccult-positive stool. Hospitalized in December of 2009 with probable diverticular bleed. Extensive GI workup including colonoscopy, upper endoscopy, and capsule endoscopy. Aside from diverticular disease, no significant abnormalities. Diminutive erosion and erythema in the small bowel noted. He does have chronic renal insufficiency. Hospitalized in December with acute bronchitis and fatigue. Anemic on admission with a hemoglobin of 7.9. Subsequently drifted to 6.7. He had been having some intermittent hemorrhoidal bleeding. No melena or hematochezia. He underwent upper endoscopy and flexible sigmoidoscopy with Dr. Arlyce Dice. Upper endoscopy revealed one nonbleeding gastric AVM which was ablated. No other abdomen allergies. Normal left colon. He presents today for followup. He is accompanied by his daughter. Hemoglobin 2 days ago at his primary provider's office was 11.3 (per their report). Since hospital discharge, no GI complaints. He has not noticed any blood. Normal stools.  REVIEW OF SYSTEMS:  All non-GI ROS negative except for exertional shortness of breath and cough  Past Medical History  Diagnosis Date  . Adenomatous colon polyp   . Diverticulosis   . GERD (gastroesophageal reflux disease)   . Hiatal hernia   . Alzheimer disease   . Gout   . Renal insufficiency   . DM (diabetes mellitus)   . Asthma with bronchitis   . COPD (chronic obstructive pulmonary disease)   . Chronic hypoxemic respiratory failure   . OSA (obstructive sleep apnea)   . CHF (congestive heart failure)   . Blood transfusion   . Arthritis   . Myocardial infarction   . Pneumonia   . Anemia   . GI bleed     Past Surgical History  Procedure Date  . Coronary artery bypass graft   . Hand surgery   . Laparoscopic cholecystectomy   .  Esophagogastroduodenoscopy 05/05/2011    Procedure: ESOPHAGOGASTRODUODENOSCOPY (EGD);  Surgeon: Louis Meckel, MD;  Location: [facility];  Service: Endoscopy;  Laterality: N/A;  . Flexible sigmoidoscopy 05/05/2011    Procedure: FLEXIBLE SIGMOIDOSCOPY;  Surgeon: Louis Meckel, MD;  Location: [facility];  Service: Endoscopy;  Laterality: N/A;    Social History [name]  reports that he has quit smoking. He quit smokeless tobacco use about 21 years ago. He reports that he does not drink alcohol or use illicit drugs.  family history includes ALS in his sister and unspecified family members; Arthritis in his mother; Diabetes in his mother; Heart disease in his brother, father, and sister; Lung cancer in his brother; and Stomach cancer in his maternal grandfather.  Allergies  Allergen Reactions  . Cefuroxime Other (See Comments)    REACTION: unkown reaction  . Donepezil Diarrhea  . Heparin Itching  . Hydralazine Other (See Comments)    DO NOT TAKE PER MD  . Hydromorphone Itching  . Metoprolol Other (See Comments)    unknown  . Morphine Itching    REACTION: itching  . Plavix (Clopidogrel Bisulfate) Other (See Comments)    GI BLEEDING  . Promethazine Other (See Comments)    DO NOT TAKE PER MD  . Sulfonamide Derivatives Itching  . Zaroxolyn (Metolazone) Diarrhea  . Ertapenem Itching and Rash       PHYSICAL EXAMINATION: Vital signs: BP 130/50  Pulse 60  Ht 5' 8.5" (1.74 m)  Wt 245 lb (111.131 kg)  BMI 36.71 kg/m2 General:obese,Well-developed, well-nourished, no acute distress HEENT: Sclerae are anicteric, conjunctiva pink. Oral mucosa  intact Lungs: Clear Heart: Regular Abdomen: soft,obese, nontender, nondistended, no obvious ascites, no peritoneal signs, normal bowel sounds. No organomegaly. Extremities: No edema Psychiatric: alert and oriented x3. Cooperative    ASSESSMENT:  #1. Anemia. Multifactorial.most recent hemoglobin 11.3. Discharge hemoglobin  8.2. #2. History of diverticular bleed December 2009 #3. Recent hospitalization. Anemia without acute bleeding. Gastric AVM ablated. Normal left colon #4. Multiple significant medical problems   PLAN:  #1. Continue iron #2. Periodic blood checks with PCP #3. Transfuse when necessary #4. Return to GI for subacute or acute bleeding. Otherwise, return to the care of her primary provider

## 2011-06-08 NOTE — Patient Instructions (Signed)
Please follow up as needed 

## 2011-10-08 ENCOUNTER — Encounter: Payer: Self-pay | Admitting: Internal Medicine

## 2011-10-08 ENCOUNTER — Ambulatory Visit (INDEPENDENT_AMBULATORY_CARE_PROVIDER_SITE_OTHER): Payer: BC Managed Care – PPO | Admitting: Internal Medicine

## 2011-10-08 VITALS — BP 138/60 | HR 63 | Ht 69.0 in | Wt 251.6 lb

## 2011-10-08 DIAGNOSIS — J209 Acute bronchitis, unspecified: Secondary | ICD-10-CM

## 2011-10-08 DIAGNOSIS — G4733 Obstructive sleep apnea (adult) (pediatric): Secondary | ICD-10-CM

## 2011-10-08 DIAGNOSIS — J42 Unspecified chronic bronchitis: Secondary | ICD-10-CM

## 2011-10-08 MED ORDER — ALBUTEROL SULFATE HFA 108 (90 BASE) MCG/ACT IN AERS: 2.0000 | INHALATION_SPRAY | RESPIRATORY_TRACT | Status: DC | PRN

## 2011-10-08 MED ORDER — METHYLPREDNISOLONE ACETATE 80 MG/ML IJ SUSP
80.0000 mg | Freq: Once | INTRAMUSCULAR | Status: AC
  Administered 2011-10-08: 80 mg via INTRAMUSCULAR

## 2011-10-08 MED ORDER — LEVALBUTEROL HCL 0.63 MG/3ML IN NEBU
0.6300 mg | INHALATION_SOLUTION | Freq: Once | RESPIRATORY_TRACT | Status: AC
  Administered 2011-10-08: 0.63 mg via RESPIRATORY_TRACT

## 2011-10-08 NOTE — Patient Instructions (Signed)
Neb xop 0.63  Depo 80  When you come back, I would like to consider Daliresp trial for chronic bronchitis

## 2011-10-08 NOTE — Progress Notes (Signed)
Subjective:    Patient ID: [name], male    DOB: [birth year], 78 y.o.   MRN: [id number]  HPI 10/06/10- 66 yoM former smoker followed for sleep apnea, bronchitis, complicated by CAD with hx acute and chronic respiratory failure, diverticular bleed with anemia. Gout. Wife here. Last here July 07, 2010 . A CT had suggested MAIC with bronchitis. He had mentioned palpitations last time, but no recent concern.  Gout has flared. On maintenance prednisone 5 mg/day for gout. Occasionally some mild chest congestion but Dulera 200 has controlled him. He would like to have a rescue inhaler.  He continues to use CPAP 13/ SMS all night every night, with O2 for sleep at 3L/M.   04/09/11- 10/06/10- 77 yoM former smoker followed for sleep apnea, bronchitis, complicated by CAD with hx acute and chronic respiratory failure, diverticular bleed with anemia. Gout. Wife here. They describe increased cough, wheeze, shortness of breath for several months with little change from day to day or with weather. He has portable oxygen which he doesn't use, just adding oxygen to his CPAP at night. Cough productive of some light yellow sputum with no blood no chest pain. He did have a cold a few weeks ago at his primary physician and given antibiotic. Using his rescue inhaler 2 or 3 times per week. Nebulizer has been a big help, used at least once daily. He is taking prednisone 5 mg daily for gout. He like to Spiriva but it cost too much. Continues CPAP 13+ oxygen with good compliance and control. Chest x-ray: 11/20/2010-CE, hyperinflation, no active process. A ventilation perfusion lung scan on 11/20/2010 showed air trapping with no PE.  10/08/11-  77 yoM former smoker followed for sleep apnea, bronchitis, complicated by CAD with hx acute and chronic respiratory failure, diverticular bleed with anemia. Gout. ? Bronchitis flare up; increased cough-slight productive yellow in color, SOB and wheezing. Chronic cough with  little phlegm with scant yellow no blood. Still on Spiriva but complains it is expensive. Remains on maintenance prednisone 5 mg daily for gout diabetes prevents increasing maintenance prednisone and medication cost is a problem for alternatives. Uses CPAP every night with O2 approx 8-9 hours  Review of Systems-see HPI Constitutional:   No-   weight loss, night sweats, fevers, chills, fatigue, lassitude. HEENT:   No-  headaches, difficulty swallowing, tooth/dental problems, sore throat,       No-  sneezing, itching, ear ache, nasal congestion, post nasal drip,  CV:  No-   chest pain, orthopnea, PND, swelling in lower extremities, anasarca,  dizziness, palpitations Resp: +   shortness of breath with exertion or at rest.              +   productive cough,  No non-productive cough,  No- coughing up of blood.            +  change in color of mucus.  No- wheezing.   Skin: No-   rash or lesions. GI:  No-   heartburn, indigestion, abdominal pain, nausea, vomiting, e GU:  MS:  No-   joint pain or swelling.   Neuro-     nothing unusual Psych:  No- change in mood or affect. No depression or anxiety.  No memory loss.   Objective:   Physical Exam General- Alert, Oriented, Affect-appropriate, Distress- none acute; obese Skin- rash-none, lesions- none, excoriation- none Lymphadenopathy- none Head- atraumatic            Eyes- Gross vision intact, PERRLA,  conjunctivae clear secretions            Ears- Hearing, canals-normal            Nose- Clear, no-Septal dev, mucus, polyps, erosion, perforation             Throat- Mallampati II , mucosa clear , drainage- none, tonsils- atrophic Neck- flexible , trachea midline, no stridor , thyroid nl, carotid no bruit Chest - symmetrical excursion , unlabored           Heart/CV- RRR , no murmur , no gallop  , no rub, nl s1 s2                           - JVD- none , edema- none, stasis changes- none, varices- none           Lung- bilateral crackles, unlabored,  wheeze- none, cough+ with deep breath , dullness-none, rub- none           Chest wall-  Abd-  Br/ Gen/ Rectal- Not done, not indicated Extrem- cyanosis- none, clubbing, none, atrophy- none, strength- nl Neuro- grossly intact to observation

## 2011-10-13 NOTE — Assessment & Plan Note (Signed)
Good compliance and control 

## 2011-10-13 NOTE — Assessment & Plan Note (Signed)
I can't tell what clinical status has changed much during the winter. He did get a Z-Pak from Dr. Jeannetta Nap. Maintenance prednisone is treating his gout but may help his lungs. Considered Daliresp.

## 2011-11-23 HISTORY — PX: ILIAC ARTERY STENT: SHX1786

## 2011-11-24 ENCOUNTER — Encounter (HOSPITAL_COMMUNITY): Payer: Self-pay | Admitting: Pharmacy Technician

## 2011-12-07 ENCOUNTER — Encounter (HOSPITAL_COMMUNITY): Payer: Self-pay | Admitting: General Practice

## 2011-12-07 ENCOUNTER — Inpatient Hospital Stay (HOSPITAL_COMMUNITY)
Admission: AD | Admit: 2011-12-07 | Discharge: 2011-12-11 | DRG: 550 | Disposition: A | Discharge status: Home / Self Care | Payer: BC Managed Care – PPO | Source: Ambulatory Visit | Attending: Cardiovascular Disease | Admitting: Cardiovascular Disease

## 2011-12-07 ENCOUNTER — Other Ambulatory Visit: Payer: Self-pay

## 2011-12-07 DIAGNOSIS — K449 Diaphragmatic hernia without obstruction or gangrene: Secondary | ICD-10-CM | POA: Diagnosis present

## 2011-12-07 DIAGNOSIS — G309 Alzheimer's disease, unspecified: Secondary | ICD-10-CM | POA: Diagnosis present

## 2011-12-07 DIAGNOSIS — I2510 Atherosclerotic heart disease of native coronary artery without angina pectoris: Secondary | ICD-10-CM | POA: Diagnosis present

## 2011-12-07 DIAGNOSIS — I509 Heart failure, unspecified: Secondary | ICD-10-CM | POA: Diagnosis present

## 2011-12-07 DIAGNOSIS — N189 Chronic kidney disease, unspecified: Secondary | ICD-10-CM | POA: Diagnosis present

## 2011-12-07 DIAGNOSIS — IMO0002 Reserved for concepts with insufficient information to code with codable children: Secondary | ICD-10-CM

## 2011-12-07 DIAGNOSIS — J9610 Chronic respiratory failure, unspecified whether with hypoxia or hypercapnia: Secondary | ICD-10-CM | POA: Diagnosis present

## 2011-12-07 DIAGNOSIS — I5032 Chronic diastolic (congestive) heart failure: Secondary | ICD-10-CM | POA: Diagnosis present

## 2011-12-07 DIAGNOSIS — J449 Chronic obstructive pulmonary disease, unspecified: Secondary | ICD-10-CM | POA: Diagnosis present

## 2011-12-07 DIAGNOSIS — R079 Chest pain, unspecified: Secondary | ICD-10-CM | POA: Diagnosis not present

## 2011-12-07 DIAGNOSIS — Z7982 Long term (current) use of aspirin: Secondary | ICD-10-CM

## 2011-12-07 DIAGNOSIS — M109 Gout, unspecified: Secondary | ICD-10-CM | POA: Diagnosis present

## 2011-12-07 DIAGNOSIS — E785 Hyperlipidemia, unspecified: Secondary | ICD-10-CM | POA: Diagnosis present

## 2011-12-07 DIAGNOSIS — Z79899 Other long term (current) drug therapy: Secondary | ICD-10-CM

## 2011-12-07 DIAGNOSIS — Z951 Presence of aortocoronary bypass graft: Secondary | ICD-10-CM

## 2011-12-07 DIAGNOSIS — G4733 Obstructive sleep apnea (adult) (pediatric): Secondary | ICD-10-CM | POA: Diagnosis present

## 2011-12-07 DIAGNOSIS — T82898A Other specified complication of vascular prosthetic devices, implants and grafts, initial encounter: Principal | ICD-10-CM | POA: Diagnosis present

## 2011-12-07 DIAGNOSIS — Y831 Surgical operation with implant of artificial internal device as the cause of abnormal reaction of the patient, or of later complication, without mention of misadventure at the time of the procedure: Secondary | ICD-10-CM | POA: Diagnosis present

## 2011-12-07 DIAGNOSIS — J4489 Other specified chronic obstructive pulmonary disease: Secondary | ICD-10-CM | POA: Diagnosis present

## 2011-12-07 DIAGNOSIS — J209 Acute bronchitis, unspecified: Secondary | ICD-10-CM | POA: Diagnosis present

## 2011-12-07 DIAGNOSIS — Z860100 Personal history of colon polyps, unspecified: Secondary | ICD-10-CM

## 2011-12-07 DIAGNOSIS — K5730 Diverticulosis of large intestine without perforation or abscess without bleeding: Secondary | ICD-10-CM | POA: Diagnosis present

## 2011-12-07 DIAGNOSIS — I252 Old myocardial infarction: Secondary | ICD-10-CM

## 2011-12-07 DIAGNOSIS — I739 Peripheral vascular disease, unspecified: Secondary | ICD-10-CM | POA: Diagnosis present

## 2011-12-07 DIAGNOSIS — F0280 Dementia in other diseases classified elsewhere without behavioral disturbance: Secondary | ICD-10-CM | POA: Diagnosis present

## 2011-12-07 DIAGNOSIS — Z794 Long term (current) use of insulin: Secondary | ICD-10-CM

## 2011-12-07 DIAGNOSIS — I10 Essential (primary) hypertension: Secondary | ICD-10-CM | POA: Diagnosis present

## 2011-12-07 DIAGNOSIS — K219 Gastro-esophageal reflux disease without esophagitis: Secondary | ICD-10-CM | POA: Diagnosis present

## 2011-12-07 DIAGNOSIS — E119 Type 2 diabetes mellitus without complications: Secondary | ICD-10-CM | POA: Diagnosis present

## 2011-12-07 HISTORY — DX: Cardiac arrhythmia, unspecified: I49.9

## 2011-12-07 HISTORY — DX: Peripheral vascular disease, unspecified: I73.9

## 2011-12-07 HISTORY — DX: Essential (primary) hypertension: I10

## 2011-12-07 LAB — BASIC METABOLIC PANEL
BUN: 92 mg/dL — ABNORMAL HIGH (ref 6–23)
BUN: 88 mg/dL — ABNORMAL HIGH (ref 6–23)
Calcium: 9.1 mg/dL (ref 8.4–10.5)
Chloride: 100 mEq/L (ref 96–112)
Creatinine, Ser: 2.97 mg/dL — ABNORMAL HIGH (ref 0.50–1.35)
GFR calc Af Amer: 20 mL/min — ABNORMAL LOW (ref >90)
GFR calc Af Amer: 22 mL/min — ABNORMAL LOW (ref >90)
Potassium: 5.4 mEq/L — ABNORMAL HIGH (ref 3.5–5.1)

## 2011-12-07 LAB — CBC
MCV: 96 fL (ref 78.0–100.0)
Platelets: 206 10*3/uL (ref 150–400)
RBC: 3.49 MIL/uL — ABNORMAL LOW (ref 4.22–5.81)
WBC: 7.1 10*3/uL (ref 4.0–10.5)

## 2011-12-07 LAB — GLUCOSE, CAPILLARY
Glucose-Capillary: 238 mg/dL — ABNORMAL HIGH (ref 70–99)
Glucose-Capillary: 169 mg/dL — ABNORMAL HIGH (ref 70–99)

## 2011-12-07 LAB — PROTIME-INR
INR: 1.15 (ref 0.00–1.49)
Prothrombin Time: 14.9 seconds (ref 11.6–15.2)

## 2011-12-07 MED ORDER — SODIUM CHLORIDE 0.9 % IJ SOLN: 3.0000 mL | INTRAMUSCULAR | Status: DC | PRN

## 2011-12-07 MED ORDER — ESCITALOPRAM OXALATE 20 MG PO TABS
20.0000 mg | ORAL_TABLET | Freq: Every day | ORAL | Status: DC
  Administered 2011-12-07 – 2011-12-11 (×5): 20 mg via ORAL
  Filled 2011-12-07 (×5): qty 1

## 2011-12-07 MED ORDER — ATORVASTATIN CALCIUM 20 MG PO TABS
20.0000 mg | ORAL_TABLET | Freq: Every day | ORAL | Status: DC
  Administered 2011-12-07 – 2011-12-11 (×5): 20 mg via ORAL
  Filled 2011-12-07 (×5): qty 1

## 2011-12-07 MED ORDER — TIOTROPIUM BROMIDE MONOHYDRATE 18 MCG IN CAPS
18.0000 ug | ORAL_CAPSULE | Freq: Every day | RESPIRATORY_TRACT | Status: DC
  Administered 2011-12-07 – 2011-12-11 (×5): 18 ug via RESPIRATORY_TRACT
  Filled 2011-12-07: qty 5

## 2011-12-07 MED ORDER — ASPIRIN 325 MG PO TABS
325.0000 mg | ORAL_TABLET | Freq: Every day | ORAL | Status: DC
  Administered 2011-12-07 – 2011-12-10 (×4): 325 mg via ORAL
  Filled 2011-12-07 (×4): qty 1

## 2011-12-07 MED ORDER — ISOSORBIDE MONONITRATE ER 60 MG PO TB24
60.0000 mg | ORAL_TABLET | Freq: Every day | ORAL | Status: DC
  Administered 2011-12-07 – 2011-12-11 (×5): 60 mg via ORAL
  Filled 2011-12-07 (×5): qty 1

## 2011-12-07 MED ORDER — EZETIMIBE 10 MG PO TABS
10.0000 mg | ORAL_TABLET | Freq: Every day | ORAL | Status: DC
  Administered 2011-12-07 – 2011-12-11 (×5): 10 mg via ORAL
  Filled 2011-12-07 (×5): qty 1

## 2011-12-07 MED ORDER — INSULIN GLARGINE 100 UNIT/ML ~~LOC~~ SOLN
56.0000 [IU] | Freq: Every day | SUBCUTANEOUS | Status: DC
  Administered 2011-12-07 – 2011-12-08 (×2): 56 [IU] via SUBCUTANEOUS

## 2011-12-07 MED ORDER — LEVOFLOXACIN 500 MG PO TABS
500.0000 mg | ORAL_TABLET | Freq: Every day | ORAL | Status: AC
  Administered 2011-12-07 – 2011-12-09 (×3): 500 mg via ORAL
  Filled 2011-12-07 (×3): qty 1

## 2011-12-07 MED ORDER — INSULIN ASPART 100 UNIT/ML ~~LOC~~ SOLN
0.0000 [IU] | Freq: Three times a day (TID) | SUBCUTANEOUS | Status: DC
  Administered 2011-12-07: 4 [IU] via SUBCUTANEOUS
  Administered 2011-12-08 – 2011-12-09 (×3): 3 [IU] via SUBCUTANEOUS

## 2011-12-07 MED ORDER — SODIUM CHLORIDE 0.9 % IV SOLN
250.0000 mL | INTRAVENOUS | Status: DC | PRN
  Administered 2011-12-10: 250 mL via INTRAVENOUS

## 2011-12-07 MED ORDER — ASPIRIN 81 MG PO CHEW
324.0000 mg | CHEWABLE_TABLET | ORAL | Status: AC
  Administered 2011-12-08: 324 mg via ORAL
  Filled 2011-12-07: qty 4

## 2011-12-07 MED ORDER — ALLOPURINOL 300 MG PO TABS
300.0000 mg | ORAL_TABLET | Freq: Two times a day (BID) | ORAL | Status: DC
  Administered 2011-12-07 – 2011-12-11 (×9): 300 mg via ORAL
  Filled 2011-12-07 (×11): qty 1

## 2011-12-07 MED ORDER — SODIUM CHLORIDE 0.9 % IV SOLN
INTRAVENOUS | Status: DC
  Administered 2011-12-07 – 2011-12-09 (×5): via INTRAVENOUS

## 2011-12-07 MED ORDER — SODIUM CHLORIDE 0.9 % IJ SOLN
3.0000 mL | Freq: Two times a day (BID) | INTRAMUSCULAR | Status: DC
  Administered 2011-12-07 – 2011-12-10 (×5): 3 mL via INTRAVENOUS

## 2011-12-07 MED ORDER — ALBUTEROL SULFATE (5 MG/ML) 0.5% IN NEBU
2.5000 mg | INHALATION_SOLUTION | Freq: Four times a day (QID) | RESPIRATORY_TRACT | Status: DC | PRN
  Administered 2011-12-09: 2.5 mg via RESPIRATORY_TRACT
  Filled 2011-12-07: qty 0.5

## 2011-12-07 MED ORDER — ALBUTEROL SULFATE HFA 108 (90 BASE) MCG/ACT IN AERS
2.0000 | INHALATION_SPRAY | RESPIRATORY_TRACT | Status: DC | PRN
  Filled 2011-12-07: qty 6.7

## 2011-12-07 MED ORDER — INSULIN GLARGINE 100 UNIT/ML ~~LOC~~ SOLN
53.0000 [IU] | Freq: Every morning | SUBCUTANEOUS | Status: DC
  Administered 2011-12-08: 53 [IU] via SUBCUTANEOUS

## 2011-12-07 MED ORDER — INSULIN ASPART 100 UNIT/ML ~~LOC~~ SOLN: 0.0000 [IU] | Freq: Three times a day (TID) | SUBCUTANEOUS | Status: DC

## 2011-12-07 MED ORDER — INSULIN GLARGINE 100 UNIT/ML ~~LOC~~ SOLN
53.0000 [IU] | Freq: Every day | SUBCUTANEOUS | Status: DC
  Administered 2011-12-07: 53 [IU] via SUBCUTANEOUS

## 2011-12-07 MED ORDER — DICLOFENAC SODIUM 0.1 % OP SOLN
1.0000 [drp] | Freq: Two times a day (BID) | OPHTHALMIC | Status: DC
  Filled 2011-12-07: qty 2.5

## 2011-12-07 MED ORDER — COLCHICINE 0.6 MG PO TABS
0.6000 mg | ORAL_TABLET | Freq: Every day | ORAL | Status: DC
  Administered 2011-12-07 – 2011-12-11 (×5): 0.6 mg via ORAL
  Filled 2011-12-07 (×5): qty 1

## 2011-12-07 MED ORDER — GABAPENTIN 100 MG PO CAPS
100.0000 mg | ORAL_CAPSULE | Freq: Every day | ORAL | Status: DC
  Administered 2011-12-07 – 2011-12-10 (×4): 100 mg via ORAL
  Filled 2011-12-07 (×6): qty 1

## 2011-12-07 MED ORDER — PREDNISONE 5 MG PO TABS
5.0000 mg | ORAL_TABLET | Freq: Every day | ORAL | Status: DC
  Administered 2011-12-08 – 2011-12-11 (×4): 5 mg via ORAL
  Filled 2011-12-07 (×5): qty 1

## 2011-12-07 MED ORDER — INSULIN ASPART 100 UNIT/ML ~~LOC~~ SOLN: 10.0000 [IU] | Freq: Three times a day (TID) | SUBCUTANEOUS | Status: DC

## 2011-12-07 MED ORDER — DICLOFENAC SODIUM 0.1 % OP SOLN
1.0000 [drp] | Freq: Two times a day (BID) | OPHTHALMIC | Status: DC
  Administered 2011-12-08 – 2011-12-11 (×7): 1 [drp] via OPHTHALMIC
  Filled 2011-12-07: qty 2.5

## 2011-12-07 MED ORDER — PANTOPRAZOLE SODIUM 40 MG PO TBEC
40.0000 mg | DELAYED_RELEASE_TABLET | Freq: Every day | ORAL | Status: DC
  Administered 2011-12-07 – 2011-12-11 (×4): 40 mg via ORAL
  Filled 2011-12-07 (×4): qty 1

## 2011-12-07 NOTE — H&P (Signed)
[name] is an 78 y.o. male.   Chief Complaint:  Claudication/decreased ABIs HPI:   Patient is a 78 year old obese Caucasian male with history of coronary artery disease status post coronary artery bypass grafting in 1997 with LIMA to the LAD and SVG to the obtuse marginal branches sequentially. History also includes hypertension, diabetes, hyperlipidemia, COPD, obstructive sleep apnea for which he uses a CPAP, as well as peripheral vascular disease. In both iliac artery stented back in 2010 as well as left renal artery. He does have angiographically documented 60-70% ostial right subclavian artery stenosis with post stenotic dilation.  He has mild to  moderate right internal carotid artery stenosis. His most recent ABIs show evidence of in-stent restenosis within the proximal iliac stents.  Patient has been treated last 4 days for bronchitis with levofloxacin.  The patient presents today for hydration and for abdominal aortogram PA restenting of both iliac arteries are in-stent restenosis with iCAST covered stents.  Patient complains of claudication, cough, congestion with yellow sputum production as well as his usual level of shortness of breath, orthopnea and right lower extremity edema. He also states that he had a black stool last evening.  He denies palpitations, chest pain, nausea, vomiting, fever, vision changes, abdominal pain, dysuria, hematuria, hematochezia.  Past Medical History  Diagnosis Date  . Adenomatous colon polyp   . Diverticulosis   . GERD (gastroesophageal reflux disease)   . Hiatal hernia   . Alzheimer disease   . Gout   . Renal insufficiency   . DM (diabetes mellitus)   . Asthma with bronchitis   . COPD (chronic obstructive pulmonary disease)   . Chronic hypoxemic respiratory failure   . OSA (obstructive sleep apnea)   . CHF (congestive heart failure)   . Blood transfusion   . Arthritis   . Myocardial infarction   . Pneumonia   . Anemia   . GI bleed   .  Coronary artery disease   . Hypertension   . Dysrhythmia   . Peripheral vascular disease     PAD    Past Surgical History  Procedure Date  . Coronary artery bypass graft   . Hand surgery   . Laparoscopic cholecystectomy   . Esophagogastroduodenoscopy 05/05/2011    Procedure: ESOPHAGOGASTRODUODENOSCOPY (EGD);  Surgeon: Louis Meckel, MD;  Location: [facility];  Service: Endoscopy;  Laterality: N/A;  . Flexible sigmoidoscopy 05/05/2011    Procedure: FLEXIBLE SIGMOIDOSCOPY;  Surgeon: Louis Meckel, MD;  Location: [facility];  Service: Endoscopy;  Laterality: N/A;  . Coronary angioplasty     Family History  Problem Relation Age of Onset  . Lung cancer Brother   . Diabetes Mother   . Heart disease Sister   . Stomach cancer Maternal Grandfather   . Heart disease Father   . Heart disease Brother   . Arthritis Mother   . ALS Sister   . ALS      neice  . ALS      nephew   Social History:  reports that he has quit smoking. He quit smokeless tobacco use about 21 years ago. He reports that he does not drink alcohol or use illicit drugs.  Allergies:  Allergies  Allergen Reactions  . Cefuroxime Other (See Comments)    REACTION: unkown reaction  . Donepezil Diarrhea  . Heparin Itching  . Hydralazine Other (See Comments)    DO NOT TAKE PER MD  . Hydromorphone Itching  . Metoprolol Other (See Comments)  unknown  . Morphine Itching    REACTION: itching  . Plavix (Clopidogrel Bisulfate) Other (See Comments)    GI BLEEDING  . Promethazine Other (See Comments)    DO NOT TAKE PER MD  . Sulfonamide Derivatives Itching  . Zaroxolyn (Metolazone) Diarrhea  . Ertapenem Itching and Rash    Medications Prior to Admission  Medication Sig Dispense Refill  . acetaminophen (TYLENOL) 650 MG CR tablet Take 1,300 mg by mouth 2 (two) times daily.       Marland Kitchen albuterol (PROAIR HFA) 108 (90 BASE) MCG/ACT inhaler Inhale 2 puffs into the lungs every 4 (four) hours as needed for wheezing or  shortness of breath.  3 Inhaler  3  . albuterol (PROVENTIL) (2.5 MG/3ML) 0.083% nebulizer solution Take 2.5 mg by nebulization every 6 (six) hours as needed. For shortness of breath      . allopurinol (ZYLOPRIM) 300 MG tablet Take 1 tablet by mouth Twice daily.      Marland Kitchen aspirin 325 MG tablet Take 325 mg by mouth daily.        Marland Kitchen atorvastatin (LIPITOR) 20 MG tablet Take 1 tablet by mouth daily.      . bumetanide (BUMEX) 2 MG tablet Take 1.5 tablets by mouth Twice daily.       . colchicine 0.6 MG tablet Take 0.6 mg by mouth daily.       . diclofenac (VOLTAREN) 0.1 % ophthalmic solution Place 1 drop into both eyes 2 (two) times daily.        Marland Kitchen escitalopram (LEXAPRO) 10 MG tablet Take 20 mg by mouth daily.       Marland Kitchen ezetimibe (ZETIA) 10 MG tablet Take 10 mg by mouth daily.        . folic acid (FOLVITE) 400 MCG tablet Take 400 mcg by mouth daily.        Marland Kitchen gabapentin (NEURONTIN) 100 MG tablet Take 100 mg by mouth at bedtime.        . insulin glargine (LANTUS) 100 UNIT/ML injection Inject 53-56 Units into the skin 2 (two) times daily. 53 units in the morning and 56 units in the evening      . isosorbide mononitrate (IMDUR) 30 MG 24 hr tablet Take 60 mg by mouth daily.       Marland Kitchen lisinopril (PRINIVIL,ZESTRIL) 20 MG tablet Take 20 mg by mouth 2 (two) times daily.        . Multiple Vitamin (MULTIVITAMIN) tablet Take 0.5 tablets by mouth 2 (two) times daily.       . nitroGLYCERIN (NITROSTAT) 0.4 MG SL tablet Place 0.4 mg under the tongue every 5 (five) minutes as needed. May repeat x 3       . NOVOLOG FLEXPEN 100 UNIT/ML injection Inject 10-20 Units into the skin 3 (three) times daily. 10 units in the morning,20 units at noon, and 20 in the evening before meals      . pantoprazole (PROTONIX) 20 MG tablet Take 20 mg by mouth daily.      . pantoprazole (PROTONIX) 40 MG tablet Take 40 mg by mouth daily.        . predniSONE (DELTASONE) 5 MG tablet Take 5 mg by mouth daily.        Marland Kitchen tiotropium (SPIRIVA) 18 MCG  inhalation capsule Place 18 mcg into inhaler and inhale daily.         Review of Systems  Constitutional: Negative for fever and diaphoresis.  HENT: Positive for congestion. Negative for sore throat.  Eyes: Negative for blurred vision.  Respiratory: Positive for cough and shortness of breath (Usual baseline). Negative for hemoptysis.   Cardiovascular: Positive for orthopnea, claudication and leg swelling ( right lower extremity). Negative for chest pain.  Gastrointestinal: Negative for nausea, vomiting, abdominal pain, diarrhea, constipation and blood in stool.  Genitourinary: Negative for dysuria and hematuria.  Neurological: Negative for dizziness and headaches.    There were no vitals taken for this visit. Physical Exam  Constitutional: He is oriented to person, place, and time. He appears well-developed. No distress.       Obese  HENT:  Head: Normocephalic and atraumatic.  Eyes: EOM are normal. Pupils are equal, round, and reactive to light. No scleral icterus.  Neck: Normal range of motion. Neck supple.  Cardiovascular: Normal rate and regular rhythm.   Murmur heard.  Systolic murmur is present with a grade of 1/6  Pulses:      Carotid pulses are on the right side with bruit.      Radial pulses are 2+ on the right side, and 2+ on the left side.       Dorsalis pedis pulses are 1+ on the right side, and 2+ on the left side.       Posterior tibial pulses are 1+ on the right side, and 1+ on the left side.  Respiratory: Effort normal and breath sounds normal. He has no wheezes. He has no rales.  GI: Soft. Bowel sounds are normal. There is tenderness.  Musculoskeletal: He exhibits edema (Right).  Lymphadenopathy:    He has no cervical adenopathy.  Neurological: He is alert and oriented to person, place, and time. He exhibits normal muscle tone.  Skin: Skin is warm and dry.  Psychiatric: He has a normal mood and affect.     Assessment/Plan Patient Active Hospital Problem  List: Peripheral vascular disease:  bilateral iliac artery stents, right internal artery stenosis, left renal artery stent, right subclavian artery stenosis-post dilatation. (12/07/2011) DM (05/31/2008) OBSTRUCTIVE SLEEP APNEA (01/23/2010) CORONARY ARTERY DISEASE (05/31/2008) Chronic kidney disease, unspecified (05/31/2008) Acute bronchitis (12/03/11) S/P CABG x 3, 1997,  LIMA to LAD, SVG to obtuse marginal branches sequentially (12/07/2011) HTN (hypertension) (12/07/2011) HLD (hyperlipidemia) (12/07/2011)  Plan:  Patient admitted for hydration for Abdominal aortogram and iliac stenting. His lisinopril and Bumex have been held.  Will get a CBC and bmet.  We will continue levofloxacin 500 mg by mouth daily the next 3 days.  NPO after midnight.   Check hemoccult stool give dark stool last PM.  HAGER, BRYAN 12/07/2011, 2:15 PM  I have seen & examined the patient this PM with Mr. Leron Croak.  I agree with his findings, exam and plan as noted.  H/o PAD with doppler evidence of bilateral Iliac Artery ISR, admitted for overnight hydration for LE Angio / PTA / stent of ISR.  Continue other home medications. Agree with hemoccult stool given dark stool.  Marykay Lex, M.D., M.S. [facility] [address] [place], [place]  [address]  [phone] Pager # [phone]  12/07/2011 3:39 PM

## 2011-12-07 NOTE — Progress Notes (Signed)
Patient's potassium level is 5.4. PA made aware. No new orders given. Will continue to monitor patient for further changes in condition.

## 2011-12-08 ENCOUNTER — Inpatient Hospital Stay (HOSPITAL_COMMUNITY): Payer: BC Managed Care – PPO

## 2011-12-08 ENCOUNTER — Other Ambulatory Visit: Payer: Self-pay

## 2011-12-08 DIAGNOSIS — N189 Chronic kidney disease, unspecified: Secondary | ICD-10-CM | POA: Diagnosis present

## 2011-12-08 LAB — BASIC METABOLIC PANEL
CO2: 23 mEq/L (ref 19–32)
Calcium: 9.2 mg/dL (ref 8.4–10.5)
GFR calc Af Amer: 27 mL/min — ABNORMAL LOW (ref >90)
GFR calc non Af Amer: 24 mL/min — ABNORMAL LOW (ref >90)
Sodium: 142 mEq/L (ref 135–145)

## 2011-12-08 LAB — GLUCOSE, CAPILLARY
Glucose-Capillary: 100 mg/dL — ABNORMAL HIGH (ref 70–99)
Glucose-Capillary: 136 mg/dL — ABNORMAL HIGH (ref 70–99)

## 2011-12-08 LAB — CBC
Platelets: 203 10*3/uL (ref 150–400)
RBC: 3.61 MIL/uL — ABNORMAL LOW (ref 4.22–5.81)
WBC: 8.5 10*3/uL (ref 4.0–10.5)

## 2011-12-08 MED ORDER — NITROGLYCERIN 0.4 MG SL SUBL
SUBLINGUAL_TABLET | SUBLINGUAL | Status: AC
  Administered 2011-12-08: 12:00:00
  Filled 2011-12-08: qty 25

## 2011-12-08 MED ORDER — NITROGLYCERIN 0.4 MG SL SUBL
0.4000 mg | SUBLINGUAL_TABLET | SUBLINGUAL | Status: DC | PRN
  Filled 2011-12-08: qty 25

## 2011-12-08 NOTE — Progress Notes (Signed)
Inpatient Diabetes Program Recommendations  AACE/ADA: New Consensus Statement on Inpatient Glycemic Control (2009)  Target Ranges:  Prepandial:   less than 140 mg/dL      Peak postprandial:   less than 180 mg/dL (1-2 hours)      Critically ill patients:  140 - 180 mg/dL   Reason for Visit: CBG's elevated on admission  Inpatient Diabetes Program Recommendations Insulin - Basal: Currently receiving home dose of Lantus 53 units every AM and 56 units every HS. HgbA1C: Last known Hgb A1C was December 2012.  Request MD order for Hgb A1C.  Note: CBG's today are below target range.  If trend continues, may benefit from decrease in Lantus doses.  (Note, patient did not eat breakfast this morning, but ate 100% lunch.) Results for [name], [name] (MRN [id number]) as of 12/08/2011 16:01  Ref. Range 12/07/2011 13:06 12/07/2011 16:19 12/07/2011 21:36 12/08/2011 07:43 12/08/2011 10:53  Glucose-Capillary Latest Range: 70-99 mg/dL 409 (H) 811 (H) 914 (H) 82 100 (H)   Thank you.

## 2011-12-08 NOTE — Progress Notes (Signed)
Cardiac monitor alarmed v-tach for this patient. Patient was further assessed and he c/o pressure in chest area and not feeling well. He stated that before I came to the room he had a sharp pain that shot thru his chest and went right away. He also stated that he is feeling jittery and anxious. PA made aware and STAT EKG performed and one tablet of nitro was given. BP is 173/53 and HR is 62. Will continue to monitor patient for further changes in condition.

## 2011-12-08 NOTE — Progress Notes (Signed)
Subjective:  No SOB.   Objective:  Vital Signs in the last 24 hours: Temp:  [97.2 F (36.2 C)-98.6 F (37 C)] 97.3 F (36.3 C) (07/16 0618) Pulse Rate:  [60-78] 69  (07/16 0952) Resp:  [18-20] 20  (07/16 0618) BP: (125-178)/(46-107) 148/46 mmHg (07/16 0952) SpO2:  [94 %-99 %] 96 % (07/16 0827) Weight:  [110.4 kg (243 lb 6.2 oz)-110.542 kg (243 lb 11.2 oz)] 110.542 kg (243 lb 11.2 oz) (07/16 0618)  Intake/Output from previous day:  Intake/Output Summary (Last 24 hours) at 12/08/11 1034 Last data filed at 12/08/11 0950  Gross per 24 hour  Intake 2397.25 ml  Output   2275 ml  Net 122.25 ml    Physical Exam: General appearance: alert, cooperative, no distress and morbidly obese Lungs: clear to auscultation bilaterally Heart: regular rate and rhythm   Rate: 80  Rhythm: normal sinus rhythm  Lab Results:  Basename 12/08/11 0555 12/07/11 1507  WBC 8.5 7.1  HGB 11.4* 11.2*  PLT 203 206    Basename 12/08/11 0555 12/07/11 1922  NA 142 135  K 4.7 5.0  CL 106 99  CO2 23 23  GLUCOSE 91 227*  BUN 80* 88*  CREATININE 2.48* 2.97*   No results found for this basename: TROPONINI:2,CK,MB:2 in the last 72 hours Hepatic Function Panel No results found for this basename: PROT,ALBUMIN,AST,ALT,ALKPHOS,BILITOT,BILIDIR,IBILI in the last 72 hours No results found for this basename: CHOL in the last 72 hours  Basename 12/07/11 1922  INR 1.15    Imaging: No results found.  Cardiac Studies:  Assessment/Plan:   Principal Problem:  *PVD, widespread, recent OP dopplers suggest ISR bilat Iliac stents. Active Problems:  S/P CABG x 3 '97, PCI '09, cath 4/11- medical Rx  Acute on chronic renal insufficiency, baseline Scr 1.6  DM  OBSTRUCTIVE SLEEP APNEA  Acute bronchitis, on ABs X 4days  Diastolic CHF, chronic  HTN (hypertension)  HLD (hyperlipidemia)  Plan- Pt was admitted for hydration prior to PV angio as OP dopplers suggest ISR of previously placed Iliac stents. His SCr is  elevated above his baseline. I spoke with Dr Allyson Sabal- will hydrate, ACE and Bumex are on hold. He will be rescheduled for Thursday. Check CXR with recent bronchitis, (last CXR 12/12)    Corine Shelter PA-C 12/08/2011, 10:34 AM     Agree with note written by Corine Shelter [facility]  Procedure cancelled today secondary to increased Scr and worry about RCN. Pt had an episode of CP today relieved by SL NTG. Will continue to hydrate and reschedule procedure for this Thurs, 7/18.  Runell Gess 12/08/2011 1:16 PM

## 2011-12-09 LAB — BASIC METABOLIC PANEL
BUN: 51 mg/dL — ABNORMAL HIGH (ref 6–23)
CO2: 22 mEq/L (ref 19–32)
Calcium: 9.4 mg/dL (ref 8.4–10.5)
Chloride: 110 mEq/L (ref 96–112)
Creatinine, Ser: 1.81 mg/dL — ABNORMAL HIGH (ref 0.50–1.35)
GFR calc Af Amer: 40 mL/min — ABNORMAL LOW (ref >90)
GFR calc non Af Amer: 35 mL/min — ABNORMAL LOW (ref >90)
Glucose, Bld: 70 mg/dL (ref 70–99)
Potassium: 4.6 mEq/L (ref 3.5–5.1)
Sodium: 144 mEq/L (ref 135–145)

## 2011-12-09 LAB — GLUCOSE, CAPILLARY: Glucose-Capillary: 130 mg/dL — ABNORMAL HIGH (ref 70–99)

## 2011-12-09 MED ORDER — INSULIN GLARGINE 100 UNIT/ML ~~LOC~~ SOLN
56.0000 [IU] | Freq: Every day | SUBCUTANEOUS | Status: DC
  Administered 2011-12-10: 56 [IU] via SUBCUTANEOUS

## 2011-12-09 MED ORDER — INSULIN GLARGINE 100 UNIT/ML ~~LOC~~ SOLN
28.0000 [IU] | Freq: Every day | SUBCUTANEOUS | Status: AC
  Administered 2011-12-09: 28 [IU] via SUBCUTANEOUS

## 2011-12-09 MED ORDER — INSULIN GLARGINE 100 UNIT/ML ~~LOC~~ SOLN: 56.0000 [IU] | Freq: Every day | SUBCUTANEOUS | Status: DC

## 2011-12-09 MED ORDER — INSULIN GLARGINE 100 UNIT/ML ~~LOC~~ SOLN
53.0000 [IU] | Freq: Every morning | SUBCUTANEOUS | Status: DC
  Administered 2011-12-09 – 2011-12-11 (×2): 53 [IU] via SUBCUTANEOUS

## 2011-12-09 NOTE — Progress Notes (Signed)
I cosign for Karen Wall, RN's assessment, med administration, notes, I&O, and care plan/education.  

## 2011-12-09 NOTE — Progress Notes (Signed)
Inpatient Diabetes Program Recommendations  AACE/ADA: New Consensus Statement on Inpatient Glycemic Control (2009)  Target Ranges:  Prepandial:   less than 140 mg/dL      Peak postprandial:   less than 180 mg/dL (1-2 hours)      Critically ill patients:  140 - 180 mg/dL   Reason for Visit: Results for [name], [name] (MRN [id number]) as of 12/09/2011 14:00  Ref. Range 12/09/2011 06:42 12/09/2011 11:08  Glucose-Capillary Latest Range: 70-99 mg/dL 65 (L) 086 (H)    Note low fasting CBG.   Inpatient Diabetes Program Recommendations Insulin - Basal: Decrease PM dose of Lantus to 46 units at bedtime. HgbA1C: Last known Hgb A1C was December 2012.  Request MD order for Hgb A1C.  Note: will follow.

## 2011-12-09 NOTE — Progress Notes (Signed)
Subjective:  No chest pain or SOB  Objective:  Vital Signs in the last 24 hours: Temp:  [98 F (36.7 C)-98.5 F (36.9 C)] 98 F (36.7 C) (07/17 0536) Pulse Rate:  [63-74] 74  (07/17 0536) Resp:  [18-20] 18  (07/17 0536) BP: (140-178)/(46-52) 178/47 mmHg (07/17 0536) SpO2:  [96 %-100 %] 98 % (07/17 0536) Weight:  [110.904 kg (244 lb 8 oz)] 110.904 kg (244 lb 8 oz) (07/17 0536)  Intake/Output from previous day:  Intake/Output Summary (Last 24 hours) at 12/09/11 0942 Last data filed at 12/09/11 0818  Gross per 24 hour  Intake 3141.75 ml  Output   1225 ml  Net 1916.75 ml    Physical Exam: General appearance: alert, cooperative, no distress and morbidly obese Lungs: clear to auscultation bilaterally Heart: regular rate and rhythm   Rate: 74  Rhythm: normal sinus rhythm  Lab Results:  Basename 12/08/11 0555 12/07/11 1507  WBC 8.5 7.1  HGB 11.4* 11.2*  PLT 203 206    Basename 12/09/11 0535 12/08/11 0555  NA 144 142  K 4.6 4.7  CL 110 106  CO2 22 23  GLUCOSE 70 91  BUN 51* 80*  CREATININE 1.81* 2.48*   No results found for this basename: TROPONINI:2,CK,MB:2 in the last 72 hours Hepatic Function Panel No results found for this basename: PROT,ALBUMIN,AST,ALT,ALKPHOS,BILITOT,BILIDIR,IBILI in the last 72 hours No results found for this basename: CHOL in the last 72 hours  Basename 12/07/11 1922  INR 1.15    Imaging: Imaging results have been reviewed  Cardiac Studies:  Assessment/Plan:   Principal Problem:  *PVD, widespread, recent OP dopplers suggest ISR bilat Iliac stents. Active Problems:  S/P CABG x 3 '97, PCI '09, cath 4/11- medical Rx  Acute on chronic renal insufficiency, baseline Scr 1.6  DM  OBSTRUCTIVE SLEEP APNEA  Acute bronchitis, on ABs X 4days  Diastolic CHF, chronic  HTN (hypertension)  HLD (hyperlipidemia)  Plan- Stable off diuretics and ARB, getting hydrated with NS at 75cchr. SCr improving, down to 1.8 today. He is for PVA  tomorrow.    Corine Shelter PA-C 12/09/2011, 9:42 AM    I have seen and examined the patient along with Corine Shelter PA-C.  I have reviewed the chart, notes and new data.  I agree with PA's note.   PLAN: Clinically stable, no cardiac complaints. Renal function is poor (probably CKD stage 3 with resolving ARF). For PVA tomorrow.  Thurmon Fair, MD, [facility] [phone] 12/09/2011, 10:52 AM

## 2011-12-10 ENCOUNTER — Encounter (HOSPITAL_COMMUNITY)
Admission: AD | Disposition: A | Discharge status: Home / Self Care | Payer: Self-pay | Source: Ambulatory Visit | Attending: Cardiovascular Disease

## 2011-12-10 HISTORY — PX: ATHERECTOMY: SHX5502

## 2011-12-10 LAB — BASIC METABOLIC PANEL
BUN: 32 mg/dL — ABNORMAL HIGH (ref 6–23)
CO2: 28 mEq/L (ref 19–32)
Calcium: 9.9 mg/dL (ref 8.4–10.5)
Chloride: 108 mEq/L (ref 96–112)
Creatinine, Ser: 1.57 mg/dL — ABNORMAL HIGH (ref 0.50–1.35)
GFR calc Af Amer: 48 mL/min — ABNORMAL LOW (ref >90)
GFR calc non Af Amer: 41 mL/min — ABNORMAL LOW (ref >90)
Glucose, Bld: 51 mg/dL — ABNORMAL LOW (ref 70–99)
Potassium: 4.6 mEq/L (ref 3.5–5.1)
Sodium: 144 mEq/L (ref 135–145)

## 2011-12-10 LAB — GLUCOSE, CAPILLARY
Glucose-Capillary: 53 mg/dL — ABNORMAL LOW (ref 70–99)
Glucose-Capillary: 82 mg/dL (ref 70–99)
Glucose-Capillary: 177 mg/dL — ABNORMAL HIGH (ref 70–99)

## 2011-12-10 SURGERY — ATHERECTOMY
Anesthesia: LOCAL

## 2011-12-10 MED ORDER — ONDANSETRON HCL 4 MG/2ML IJ SOLN: 4.0000 mg | Freq: Four times a day (QID) | INTRAMUSCULAR | Status: DC | PRN

## 2011-12-10 MED ORDER — AMLODIPINE BESYLATE 5 MG PO TABS
5.0000 mg | ORAL_TABLET | Freq: Every day | ORAL | Status: DC
  Filled 2011-12-10 (×2): qty 1

## 2011-12-10 MED ORDER — BIVALIRUDIN 250 MG IV SOLR
INTRAVENOUS | Status: AC
  Filled 2011-12-10: qty 250

## 2011-12-10 MED ORDER — LABETALOL HCL 5 MG/ML IV SOLN: 10.0000 mg | INTRAVENOUS | Status: DC

## 2011-12-10 MED ORDER — FENTANYL CITRATE 0.05 MG/ML IJ SOLN
INTRAMUSCULAR | Status: AC
  Filled 2011-12-10: qty 2

## 2011-12-10 MED ORDER — SODIUM CHLORIDE 0.9 % IV SOLN: INTRAVENOUS | Status: AC

## 2011-12-10 MED ORDER — LIDOCAINE HCL (PF) 1 % IJ SOLN
INTRAMUSCULAR | Status: AC
  Filled 2011-12-10: qty 30

## 2011-12-10 MED ORDER — DEXTROSE 50 % IV SOLN
INTRAVENOUS | Status: AC
  Administered 2011-12-10: 50 mL
  Filled 2011-12-10: qty 50

## 2011-12-10 MED ORDER — HEPARIN (PORCINE) IN NACL 2-0.9 UNIT/ML-% IJ SOLN
INTRAMUSCULAR | Status: AC
  Filled 2011-12-10: qty 1000

## 2011-12-10 MED ORDER — CLONIDINE HCL ER 0.1 MG PO TB12
0.1000 mg | ORAL_TABLET | Freq: Two times a day (BID) | ORAL | Status: DC
  Administered 2011-12-11 (×2): 0.1 mg via ORAL
  Filled 2011-12-10 (×3): qty 1

## 2011-12-10 MED ORDER — DEXTROSE 50 % IV SOLN
25.0000 mL | Freq: Once | INTRAVENOUS | Status: DC
  Filled 2011-12-10: qty 50

## 2011-12-10 MED ORDER — ASPIRIN EC 325 MG PO TBEC
325.0000 mg | DELAYED_RELEASE_TABLET | Freq: Every day | ORAL | Status: DC
  Administered 2011-12-11: 325 mg via ORAL
  Filled 2011-12-10: qty 1

## 2011-12-10 MED ORDER — ACETAMINOPHEN 325 MG PO TABS: 650.0000 mg | ORAL_TABLET | ORAL | Status: DC | PRN

## 2011-12-10 MED ORDER — MIDAZOLAM HCL 2 MG/2ML IJ SOLN
INTRAMUSCULAR | Status: AC
  Filled 2011-12-10: qty 2

## 2011-12-10 NOTE — Progress Notes (Signed)
Pt transported to cath lab & accompanied by nurse.  Notified [place]  Nurse in cath lab of pt,s BP.  And meds that were ordered and not yet given on the floor.  Instructed that he will give him med in cath lab.  Amanda Pea, Charity fundraiser.

## 2011-12-10 NOTE — H&P (Signed)
  H & P will be scanned in.  Pt was reexamined and existing H & P reviewed. No changes found.  Runell Gess, MD [facility] 12/10/2011 11:27 AM

## 2011-12-10 NOTE — Progress Notes (Signed)
[facility]  Subjective: Not feeling good.  Dreading the procedure.  Objective: Vital signs in last 24 hours: Temp:  [97.3 F (36.3 C)-98.7 F (37.1 C)] 98.7 F (37.1 C) (07/18 0502) Pulse Rate:  [56-77] 63  (07/18 0548) Resp:  [18-19] 18  (07/18 0548) BP: (158-186)/(50-77) 171/67 mmHg (07/18 0548) SpO2:  [98 %-99 %] 99 % (07/18 0757) Weight:  [110.542 kg (243 lb 11.2 oz)] 110.542 kg (243 lb 11.2 oz) (07/18 0502) Last BM Date: 12/09/11  Intake/Output from previous day: 07/17 0701 - 07/18 0700 In: 3147 [P.O.:822; I.V.:2325] Out: 2025 [Urine:2025] Intake/Output this shift: Total I/O In: 0  Out: 575 [Urine:575]  Medications Current Facility-Administered Medications  Medication Dose Route Frequency Provider Last Rate Last Dose  . 0.9 %  sodium chloride infusion  250 mL Intravenous PRN Wilburt Finlay, PA      . 0.9 %  sodium chloride infusion   Intravenous Continuous Wilburt Finlay, PA 75 mL/hr at 12/09/11 2022    . albuterol (PROVENTIL HFA;VENTOLIN HFA) 108 (90 BASE) MCG/ACT inhaler 2 puff  2 puff Inhalation Q4H PRN Wilburt Finlay, PA      . albuterol (PROVENTIL) (5 MG/ML) 0.5% nebulizer solution 2.5 mg  2.5 mg Nebulization Q6H PRN Wilburt Finlay, PA   2.5 mg at 12/09/11 1138  . allopurinol (ZYLOPRIM) tablet 300 mg  300 mg Oral BID Wilburt Finlay, PA   300 mg at 12/09/11 2105  . aspirin tablet 325 mg  325 mg Oral Daily Wilburt Finlay, PA   325 mg at 12/09/11 1106  . atorvastatin (LIPITOR) tablet 20 mg  20 mg Oral Daily Wilburt Finlay, PA   20 mg at 12/09/11 1106  . colchicine tablet 0.6 mg  0.6 mg Oral Daily Wilburt Finlay, PA   0.6 mg at 12/09/11 1106  . dextrose 50 % solution 25 mL  25 mL Intravenous Once Runell Gess, MD      . dextrose 50 % solution        50 mL at 12/10/11 4098  . diclofenac (VOLTAREN) 0.1 % ophthalmic solution 1 drop  1 drop Both Eyes BID Runell Gess, MD   1 drop at 12/09/11 2107  . escitalopram (LEXAPRO) tablet 20 mg  20 mg Oral Daily  Wilburt Finlay, PA   20 mg at 12/09/11 1107  . ezetimibe (ZETIA) tablet 10 mg  10 mg Oral Daily Wilburt Finlay, PA   10 mg at 12/09/11 1107  . gabapentin (NEURONTIN) capsule 100 mg  100 mg Oral QHS Wilburt Finlay, PA   100 mg at 12/09/11 2105  . insulin aspart (novoLOG) injection 0-20 Units  0-20 Units Subcutaneous TID WC Runell Gess, MD   3 Units at 12/09/11 1715  . insulin glargine (LANTUS) injection 28 Units  28 Units Subcutaneous QHS Abran Duke, PHARMD   28 Units at 12/09/11 2106  . insulin glargine (LANTUS) injection 53 Units  53 Units Subcutaneous q morning - 10a Eda Paschal [place], [place]   53 Units at 12/09/11 1108  . insulin glargine (LANTUS) injection 56 Units  56 Units Subcutaneous QHS Abran Duke, PHARMD      . isosorbide mononitrate (IMDUR) 24 hr tablet 60 mg  60 mg Oral Daily Wilburt Finlay, PA   60 mg at 12/09/11 1106  . levofloxacin (LEVAQUIN) tablet 500 mg  500 mg Oral Daily Wilburt Finlay, PA   500 mg at 12/09/11 1107  . nitroGLYCERIN (NITROSTAT) SL tablet 0.4 mg  0.4 mg Sublingual Q5  min PRN Nada Boozer, NP      . pantoprazole (PROTONIX) EC tablet 40 mg  40 mg Oral Daily Wilburt Finlay, PA   40 mg at 12/09/11 1110  . predniSONE (DELTASONE) tablet 5 mg  5 mg Oral Q breakfast Wilburt Finlay, PA   5 mg at 12/10/11 0617  . sodium chloride 0.9 % injection 3 mL  3 mL Intravenous Q12H Wilburt Finlay, PA   3 mL at 12/08/11 2236  . sodium chloride 0.9 % injection 3 mL  3 mL Intravenous PRN Wilburt Finlay, PA      . tiotropium (SPIRIVA) inhalation capsule 18 mcg  18 mcg Inhalation Daily Wilburt Finlay, PA   18 mcg at 12/10/11 0755  . DISCONTD: insulin glargine (LANTUS) injection 56 Units  56 Units Subcutaneous QHS Abelino Derrick, [place]        PE: General appearance: alert, cooperative and no distress Lungs: clear to auscultation bilaterally and BS decreased Heart: regular rate and rhythm, 1/6 sys MM Extremities: No LEE Pulses: 2+ and symmetric 1+ right  DP, 2+ left DP. Skin: Warm and dry Neurologic: Grossly  normal  Lab Results:   Basename 12/08/11 0555 12/07/11 1507  WBC 8.5 7.1  HGB 11.4* 11.2*  HCT 34.8* 33.5*  PLT 203 206   BMET  Basename 12/10/11 0500 12/09/11 0535 12/08/11 0555  NA 144 144 142  K 4.6 4.6 4.7  CL 108 110 106  CO2 28 22 23   GLUCOSE 51* 70 91  BUN 32* 51* 80*  CREATININE 1.57* 1.81* 2.48*  CALCIUM 9.9 9.4 9.2   PT/INR  Basename 12/07/11 1922 12/07/11 1507  LABPROT 14.9 14.7  INR 1.15 1.13    Assessment/Plan  Principal Problem:  *PVD, widespread, recent OP dopplers suggest ISR bilat Iliac stents. Active Problems:  DM  OBSTRUCTIVE SLEEP APNEA  Acute bronchitis, on ABs X 4days  Diastolic CHF, chronic  S/P CABG x 3 '97, PCI '09, cath 4/11- medical Rx  HTN (hypertension)  HLD (hyperlipidemia)  Acute on chronic renal insufficiency, baseline Scr 1.6  Chronic diastolic CHF (congestive heart failure), currently off diuretics, getting IVF- stable  Plan:  PV angiogram today for possible ISRS.  SCr has improved considerably with hydration.  Holding AM Lantus as he is NPO and CBG was 53 today.  Improved to 82.   He has allergies to hydralazine and lopressor.  ACE held to improve kidney function.  Will give amlodipine 5mg  to improve BP.  SR on tele with PACs.   LOS: 3 days    Kenna Seward 12/10/2011 10:02 AM

## 2011-12-10 NOTE — Op Note (Signed)
[name] is a 78 y.o. male    [id number] LOCATION:  FACILITY: [facility]  PHYSICIAN: Nanetta Batty, M.D. 01/30/1934   DATE OF PROCEDURE:  12/10/2011  DATE OF DISCHARGE:  [facility]  PV Intervention    History obtained from chart review. Patient is a 78 year old obese Caucasian male with history of coronary artery disease status post coronary artery bypass grafting in 1997 with LIMA to the LAD and SVG to the obtuse marginal branches sequentially. History also includes hypertension, diabetes, hyperlipidemia, COPD, obstructive sleep apnea for which he uses a CPAP, as well as peripheral vascular disease. In both iliac artery stented back in 2010 as well as left renal artery. He does have angiographically documented 60-70% ostial right subclavian artery stenosis with post stenotic dilation. He has mild to moderate right internal carotid artery stenosis. His most recent ABIs show evidence of in-stent restenosis within the proximal iliac stents. Patient has been treated last 4 days for bronchitis with levofloxacin.  The patient presents today for hydration and for abdominal aortogram PA restenting of both iliac arteries are in-stent restenosis with iCAST covered stents. Patient complains of claudication, cough, congestion with yellow sputum production as well as his usual level of shortness of breath, orthopnea and right lower extremity edema. He also states that he had a black stool last evening. He denies palpitations, chest pain, nausea, vomiting, fever, vision changes, abdominal pain, dysuria, hematuria, hematochezia.    PROCEDURE DESCRIPTION:    The patient was brought to the second floor  [facility] cath lab in the postabsorptive state. He was  premedicated with Valium 5 mg by mouth, IV Versed and fentanyl.Marland Kitchen His right and left groins Were prepped and shaved in usual sterile fashion. Xylocaine 1% was used  for local anesthesia. 5 French sheaths  was  inserted into the right and left common  artery using standard Seldinger technique. A 5 French pigtail catheter was used for abdominal aortography. A 5 French short right Judkins catheter was used for selective right and left renal artery angiography. Visipaque dye was used for the entirety of the case. Retrograde aortic pressure was monitored in the case.   HEMODYNAMICS:    AO SYSTOLIC/AO DIASTOLIC: 208/75   ANGIOGRAPHIC RESULTS:   1: Abdominal aorta-50% in-stent restenosis within the right renal artery stent, 50-60% in-stent restenosis of the left renal artery stent.  2: Left lower extremity-70% distal left common, proximal left external iliac artery stenosis the proximal left common iliac artery stent was widely patent  3: Right lower extremity-patent though somewhat hypodense right common iliac artery stent  Pullback gradients were performed using a 4 French endhole catheter into 100 mcg of intra-arch of nitroglycerin across the right common iliac artery stent as well as the left. There is a 60 mm gradient noted within the stent on the right and beyond the stent on the left. Based on this it was decided to use an ICAST  covered stent for "in-stent restenosis" on the right and to use a nitinol self expanding stent on the left.   Procedure description: The patient received Angiomax bolus and infusion because of the question of all heparin allergy with an ACT documented at 364. A total of 127 cc of contrast was administered to the patient. A 5 French sheaths in both femoral arteries was were changed over an 035 Versicore  wire for a 7 French bright tip sheath. A 9 mm x 30 mm long cath that was then deployed at 68 atmospheres  within the previously placed right common iliac artery nitinol self expanding stent. A 10 x 60 mm long Cordis Smart nitinol substance that was then carefully positioned and left within the previously placed left common iliac artery stent across the internal/external  bifurcation into the left external iliac artery and post dilatation was performed with an 8 mm x 2 cm balloon resulting in reduction of a 67% distal left common iliac artery stenosis to less than 30% residual.    IMPRESSION: Successful I. cast covered stent deployment within the previously placed right common iliac artery stent for protein in-stent restenosis and throat documented by duplex ultrasound and pullback pressure gradient. Successful nitinol self expanding stent and angioplasty of a de novo lesion in the left common iliac artery. Patient had excellent graft result. There were no hematologic radiograph complications. The patient with blood in stable condition. The sheath will be removed procedure 3 hours her pressure will be held with each run to achieve hemostasis. Facial dehydrated overnight because of his previously document renal deficiency and discharged him and his groins and renal status remained stable. He is already on aspirin and apparently is allergic to Plavix. He will get Followup Doppler's in our office and I will see him back after that.  Runell Gess MD, [facility] 12/10/2011 1:01 PM

## 2011-12-10 NOTE — Progress Notes (Signed)
Pt's BP 204//69 P 74. Pt asymptomatic.  In bed conversing with wife and pastor.  Arlys John PA notified of BP.  Also rechecked manually and got same reading.  Arlys John PA ordered BP meds which are pending.  AM meds given, will cont. To monitor.  Amanda Pea, Charity fundraiser.

## 2011-12-10 NOTE — Progress Notes (Signed)
Pt with 9 beat run of SVT. Pt asymptomatic. VS stable and documented. MD's answering service paged, but no return call made yet. Pt resting comfortably in bed w/o symptoms. Will continue to monitor. Oncoming shift RN made aware in report as well.

## 2011-12-11 LAB — CBC
HCT: 33.2 % — ABNORMAL LOW (ref 39.0–52.0)
MCV: 97.9 fL (ref 78.0–100.0)
Platelets: 179 10*3/uL (ref 150–400)
RBC: 3.39 MIL/uL — ABNORMAL LOW (ref 4.22–5.81)
WBC: 8.7 10*3/uL (ref 4.0–10.5)

## 2011-12-11 LAB — GLUCOSE, CAPILLARY
Glucose-Capillary: 60 mg/dL — ABNORMAL LOW (ref 70–99)
Glucose-Capillary: 108 mg/dL — ABNORMAL HIGH (ref 70–99)

## 2011-12-11 LAB — BASIC METABOLIC PANEL
CO2: 24 mEq/L (ref 19–32)
Chloride: 108 mEq/L (ref 96–112)
Sodium: 142 mEq/L (ref 135–145)

## 2011-12-11 MED ORDER — CLONIDINE HCL ER 0.1 MG PO TB12: 0.1000 mg | ORAL_TABLET | Freq: Two times a day (BID) | ORAL | Status: AC

## 2011-12-11 MED ORDER — AMLODIPINE BESYLATE 10 MG PO TABS
10.0000 mg | ORAL_TABLET | Freq: Every day | ORAL | Status: DC
  Administered 2011-12-11: 10 mg via ORAL
  Filled 2011-12-11: qty 1

## 2011-12-11 MED ORDER — AMLODIPINE BESYLATE 10 MG PO TABS: 10.0000 mg | ORAL_TABLET | Freq: Every day | ORAL | Status: DC

## 2011-12-11 MED FILL — Dextrose Inj 5%: INTRAVENOUS | Qty: 50 | Status: AC

## 2011-12-11 NOTE — Progress Notes (Signed)
Pt and wife given discharge instructions per the teach back method and both verbalized understanding of the discharge instructions. Pt had no complaints of pain and vital signs within normal limits.  Pt escorted out of building via wheelchair to private vehicle.

## 2011-12-11 NOTE — Progress Notes (Addendum)
[facility]  Subjective: No Complaints - but still has yet to walk today  Objective: Vital signs in last 24 hours: Temp:  [98.5 F (36.9 C)-99 F (37.2 C)] 98.6 F (37 C) (07/19 0757) Pulse Rate:  [61-87] 72  (07/19 0757) Resp:  [15-28] 28  (07/19 0757) BP: (157-194)/(52-79) 173/54 mmHg (07/19 0700) SpO2:  [95 %-98 %] 96 % (07/19 0757) Last BM Date: 12/09/11  Intake/Output from previous day: 07/18 0701 - 07/19 0700 In: 787.5 [I.V.:787.5] Out: 1175 [Urine:1175] Intake/Output this shift: Total I/O In: -  Out: 200 [Urine:200]  Medications Current Facility-Administered Medications  Medication Dose Route Frequency Provider Last Rate Last Dose  . 0.9 %  sodium chloride infusion   Intravenous Continuous Runell Gess, MD      . acetaminophen (TYLENOL) tablet 650 mg  650 mg Oral Q4H PRN Runell Gess, MD      . albuterol (PROVENTIL HFA;VENTOLIN HFA) 108 (90 BASE) MCG/ACT inhaler 2 puff  2 puff Inhalation Q4H PRN Wilburt Finlay, PA      . albuterol (PROVENTIL) (5 MG/ML) 0.5% nebulizer solution 2.5 mg  2.5 mg Nebulization Q6H PRN Wilburt Finlay, PA   2.5 mg at 12/09/11 1138  . allopurinol (ZYLOPRIM) tablet 300 mg  300 mg Oral BID Wilburt Finlay, PA   300 mg at 12/10/11 2300  . amLODipine (NORVASC) tablet 5 mg  5 mg Oral Daily Wilburt Finlay, PA      . aspirin EC tablet 325 mg  325 mg Oral Daily Runell Gess, MD      . atorvastatin (LIPITOR) tablet 20 mg  20 mg Oral Daily Wilburt Finlay, PA   20 mg at 12/10/11 1023  . bivalirudin (ANGIOMAX) 250 MG injection           . cloNIDine HCl (KAPVAY) ER tablet 0.1 mg  0.1 mg Oral BID Kerry Hough, PA   0.1 mg at 12/11/11 0049  . colchicine tablet 0.6 mg  0.6 mg Oral Daily Wilburt Finlay, PA   0.6 mg at 12/10/11 1024  . diclofenac (VOLTAREN) 0.1 % ophthalmic solution 1 drop  1 drop Both Eyes BID Runell Gess, MD   1 drop at 12/10/11 2300  . escitalopram (LEXAPRO) tablet 20 mg  20 mg Oral Daily Wilburt Finlay, PA   20  mg at 12/10/11 1024  . ezetimibe (ZETIA) tablet 10 mg  10 mg Oral Daily Wilburt Finlay, PA   10 mg at 12/10/11 1024  . fentaNYL (SUBLIMAZE) 0.05 MG/ML injection           . gabapentin (NEURONTIN) capsule 100 mg  100 mg Oral QHS Wilburt Finlay, PA   100 mg at 12/10/11 2300  . heparin 2-0.9 UNIT/ML-% infusion           . insulin aspart (novoLOG) injection 0-20 Units  0-20 Units Subcutaneous TID WC Runell Gess, MD   3 Units at 12/09/11 1715  . insulin glargine (LANTUS) injection 53 Units  53 Units Subcutaneous q morning - 10a Eda Paschal [place], [place]   53 Units at 12/09/11 1108  . insulin glargine (LANTUS) injection 56 Units  56 Units Subcutaneous QHS Abran Duke, PHARMD   56 Units at 12/10/11 2158  . isosorbide mononitrate (IMDUR) 24 hr tablet 60 mg  60 mg Oral Daily Wilburt Finlay, PA   60 mg at 12/10/11 1024  . lidocaine (XYLOCAINE) 1 % injection           . lidocaine (  XYLOCAINE) 1 % injection           . lidocaine (XYLOCAINE) 1 % injection           . midazolam (VERSED) 2 MG/2ML injection           . nitroGLYCERIN (NITROSTAT) SL tablet 0.4 mg  0.4 mg Sublingual Q5 min PRN Nada Boozer, NP      . ondansetron [facility]) injection 4 mg  4 mg Intravenous Q6H PRN Runell Gess, MD      . pantoprazole (PROTONIX) EC tablet 40 mg  40 mg Oral Daily Wilburt Finlay, PA   40 mg at 12/09/11 1110  . predniSONE (DELTASONE) tablet 5 mg  5 mg Oral Q breakfast Wilburt Finlay, PA   5 mg at 12/11/11 9562  . tiotropium (SPIRIVA) inhalation capsule 18 mcg  18 mcg Inhalation Daily Wilburt Finlay, PA   18 mcg at 12/10/11 0755  . DISCONTD: 0.9 %  sodium chloride infusion  250 mL Intravenous PRN Wilburt Finlay, PA 10 mL/hr at 12/10/11 1047 250 mL at 12/10/11 1047  . DISCONTD: 0.9 %  sodium chloride infusion   Intravenous Continuous Wilburt Finlay, PA 75 mL/hr at 12/09/11 2022    . DISCONTD: aspirin tablet 325 mg  325 mg Oral Daily Wilburt Finlay, PA   325 mg at 12/10/11 1023  . DISCONTD: dextrose 50 % solution 25 mL  25 mL Intravenous Once  Runell Gess, MD      . DISCONTD: labetalol (NORMODYNE,TRANDATE) injection 10 mg  10 mg Intravenous STAT Wilburt Finlay, PA      . DISCONTD: sodium chloride 0.9 % injection 3 mL  3 mL Intravenous Q12H Wilburt Finlay, PA   3 mL at 12/10/11 1026  . DISCONTD: sodium chloride 0.9 % injection 3 mL  3 mL Intravenous PRN Wilburt Finlay, PA        PE: General appearance: alert, cooperative and no distress Lungs: clear to auscultation bilaterally Heart: regular rate and rhythm and Soft 1/6 sys MM Extremities: No LEE Pulses: 2+ and symmetric Skin: small- mod area of ecchymosis in the left groin.  Small amount in the right.  No femoral bruits.  Both groins are nontender. without hematoma.. Neurologic: Grossly normal  Lab Results:   Basename 12/11/11 0540  WBC 8.7  HGB 11.1*  HCT 33.2*  PLT 179   BMET  Basename 12/11/11 0540 12/10/11 0500 12/09/11 0535  NA 142 144 144  K 4.5 4.6 4.6  CL 108 108 110  CO2 24 28 22   GLUCOSE 53* 51* 70  BUN 21 32* 51*  CREATININE 1.25 1.57* 1.81*  CALCIUM 9.2 9.9 9.4   Studies/Results: PROCEDURE DESCRIPTION:  The patient was brought to the second floor  [facility] lab in the postabsorptive state. He was  premedicated with Valium 5 mg by mouth, IV Versed and fentanyl.Marland Kitchen His right and left groins  Were prepped and shaved in usual sterile fashion. Xylocaine 1% was used  for local anesthesia. 5 French sheaths was inserted into the right and left common  artery using standard Seldinger technique. A 5 French pigtail catheter was used for abdominal aortography. A 5 French short right Judkins catheter was used for selective right and left renal artery angiography. Visipaque dye was used for the entirety of the case. Retrograde aortic pressure was monitored in the case.  HEMODYNAMICS:  AO SYSTOLIC/AO DIASTOLIC: 208/75  ANGIOGRAPHIC RESULTS:  1: Abdominal aorta-50% in-stent restenosis within the right renal artery stent, 50-60% in-stent restenosis  of the  left renal artery stent.  2: Left lower extremity-70% distal left common, proximal left external iliac artery stenosis the proximal left common iliac artery stent was widely patent  3: Right lower extremity-patent though somewhat hypodense right common iliac artery stent  Pullback gradients were performed using a 4 French endhole catheter into 100 mcg of intra-arch of nitroglycerin across the right common iliac artery stent as well as the left. There is a 60 mm gradient noted within the stent on the right and beyond the stent on the left. Based on this it was decided to use an ICAST covered stent for "in-stent restenosis" on the right and to use a nitinol self expanding stent on the left.  Procedure description: The patient received Angiomax bolus and infusion because of the question of all heparin allergy with an ACT documented at 364. A total of 127 cc of contrast was administered to the patient. A 5 French sheaths in both femoral arteries was were changed over an 035 Versicore wire for a 7 French bright tip sheath. A 9 mm x 30 mm long cath that was then deployed at 68 atmospheres within the previously placed right common iliac artery nitinol self expanding stent. A 10 x 60 mm long Cordis Smart nitinol substance that was then carefully positioned and left within the previously placed left common iliac artery stent across the internal/external bifurcation into the left external iliac artery and post dilatation was performed with an 8 mm x 2 cm balloon resulting in reduction of a 67% distal left common iliac artery stenosis to less than 30% residual.  IMPRESSION: Successful I. cast covered stent deployment within the previously placed right common iliac artery stent for protein in-stent restenosis and throat documented by duplex ultrasound and pullback pressure gradient. Successful nitinol self expanding stent and angioplasty of a de novo lesion in the left common iliac artery. Patient had excellent graft  result. There were no hematologic radiograph complications. The patient with blood in stable condition. The sheath will be removed procedure 3 hours her pressure will be held with each run to achieve hemostasis. Facial dehydrated overnight because of his previously document renal deficiency and discharged him and his groins and renal status remained stable. He is already on aspirin and apparently is allergic to Plavix. He will get Followup Doppler's in our office and I will see him back after that.  Runell Gess MD, [facility]  Assessment/Plan  Principal Problem:  *PVD, widespread, recent OP dopplers suggest ISR bilat Iliac stents. Active Problems:  DM  OBSTRUCTIVE SLEEP APNEA  Acute bronchitis, on ABs X 4days  Diastolic CHF, chronic  S/P CABG x 3 '97, PCI '09, cath 4/11- medical Rx  HTN (hypertension)  HLD (hyperlipidemia)  Acute on chronic renal insufficiency, baseline Scr 1.6  Chronic diastolic CHF (congestive heart failure), currently off diuretics, getting IVF- stable  Plan:  Doing well. S/P I-Cast stent in the right common iliac artery for ISRS.  SCr has improved considerably from 3.26 to 1.25.  Follow-up LEA dopplers and appt with DR. Allyson Sabal.  Will restart  Bumex - holding ACE-I until renal function stabilizes.  Need to watch SCr as OP.  PO intake may be lower than it should.  Allergies to lopressor and hydralazine.  BP still high in the 170's.  Amlodipine 5mg  added yesterday - will increase to 10 mg for discharge.     LOS: 4 days    HAGER, BRYAN 12/11/2011 8:34 AM  I have seen & examined the  patient this AM along with Mr. Leron Croak.  I agree with his exam, findings & plan.  Groins have mild bruising, but are non-tender, no hematoma.  Creatinine has improved dramatically.  Provided he is able to be up & ambulate in the halls & his BPs are <158mmHg by this PM, he should be fine for discharge.  Will confer with Dr. Allyson Sabal re ? Adding an additional antiplatelet agent (has Plavix  allergy).  Agree with increased Amlodipine dose -- not many options for BP control.  Continue home insulin Rx.  Continue Statin for HLD.  Marykay Lex, M.D., M.S. [facility] [address] [place], [place]  [address]  [phone] Pager # [phone]  12/11/2011 9:17 AM '

## 2011-12-11 NOTE — Discharge Summary (Signed)
Physician Discharge Summary  Patient ID: TYJAY GALINDO MRN: [id number] DOB/AGE: [birth year] 76 y.o.  Admit date: 12/07/2011 Discharge date: 12/11/2011  Admission Diagnoses:  Peripheral vascular disease.  Discharge Diagnoses:  Principal Problem:  *PVD, widespread, recent OP dopplers suggest ISR bilat Iliac stents. Active Problems:  DM  OBSTRUCTIVE SLEEP APNEA  Acute bronchitis, on ABs X 4days  Diastolic CHF, chronic  S/P CABG x 3 '97, PCI '09, cath 4/11- medical Rx  HTN (hypertension)  HLD (hyperlipidemia)  Acute on chronic renal insufficiency, baseline Scr 1.6  Chronic diastolic CHF (congestive heart failure), currently off diuretics, getting IVF- stable   Discharged Condition: stable  Hospital Course:   Patient is a 78 year old obese Caucasian male with history of coronary artery disease status post coronary artery bypass grafting in 1997 with LIMA to the LAD and SVG to the obtuse marginal branches sequentially. History also includes hypertension, diabetes, hyperlipidemia, COPD, obstructive sleep apnea for which he uses a CPAP, as well as peripheral vascular disease. In both iliac artery stented back in 2010 as well as left renal artery. He does have angiographically documented 60-70% ostial right subclavian artery stenosis with post stenotic dilation. He has mild to moderate right internal carotid artery stenosis. His most recent ABIs show evidence of in-stent restenosis within the proximal iliac stents. Patient has been treated last 4 days for bronchitis with levofloxacin.   The patient presented for hydration and for abdominal aortogram PA restenting of both iliac arteries are in-stent restenosis with iCAST covered stents. Patient complained of claudication, cough, congestion with yellow sputum production as well as his usual level of shortness of breath, orthopnea and right lower extremity edema. He also states that he had a black stool last evening. He denies palpitations, chest  pain, nausea, vomiting, fever, vision changes, abdominal pain, dysuria, hematuria, hematochezia.  Patient admitted for hydration for Abdominal aortogram and iliac stenting. His lisinopril and Bumex were held.  Continued levofloxacin 500 mg by mouth daily the next 3 days. NPO after midnight.  Checked hemoccult stool given dark stool.  Amlodipine added for increased BP as well as clonidine.  He had considerable improvement in SCr with hydration.  Successful I.-cast covered stent deployment within the previously placed right common iliac artery stent for in-stent restenosis.  He was discharged home in stable condition.  Lisinopril DCd and can be addressed as outpt.  See med list below.  Consults: None  Significant Diagnostic Studies: PV angiogram PROCEDURE DESCRIPTION:  The patient was brought to the second floor  [facility] lab in the postabsorptive state. He was  premedicated with Valium 5 mg by mouth, IV Versed and fentanyl.Marland Kitchen His right and left groins  Were prepped and shaved in usual sterile fashion. Xylocaine 1% was used  for local anesthesia. 5 French sheaths was inserted into the right and left common  artery using standard Seldinger technique. A 5 French pigtail catheter was used for abdominal aortography. A 5 French short right Judkins catheter was used for selective right and left renal artery angiography. Visipaque dye was used for the entirety of the case. Retrograde aortic pressure was monitored in the case.  HEMODYNAMICS:  AO SYSTOLIC/AO DIASTOLIC: 208/75  ANGIOGRAPHIC RESULTS:  1: Abdominal aorta-50% in-stent restenosis within the right renal artery stent, 50-60% in-stent restenosis of the left renal artery stent.  2: Left lower extremity-70% distal left common, proximal left external iliac artery stenosis the proximal left common iliac artery stent was widely patent  3: Right lower extremity-patent though somewhat  hypodense right common iliac artery stent  Pullback  gradients were performed using a 4 French endhole catheter into 100 mcg of intra-arch of nitroglycerin across the right common iliac artery stent as well as the left. There is a 60 mm gradient noted within the stent on the right and beyond the stent on the left. Based on this it was decided to use an ICAST covered stent for "in-stent restenosis" on the right and to use a nitinol self expanding stent on the left.  Procedure description: The patient received Angiomax bolus and infusion because of the question of all heparin allergy with an ACT documented at 364. A total of 127 cc of contrast was administered to the patient. A 5 French sheaths in both femoral arteries was were changed over an 035 Versicore wire for a 7 French bright tip sheath. A 9 mm x 30 mm long cath that was then deployed at 68 atmospheres within the previously placed right common iliac artery nitinol self expanding stent. A 10 x 60 mm long Cordis Smart nitinol substance that was then carefully positioned and left within the previously placed left common iliac artery stent across the internal/external bifurcation into the left external iliac artery and post dilatation was performed with an 8 mm x 2 cm balloon resulting in reduction of a 67% distal left common iliac artery stenosis to less than 30% residual.  IMPRESSION: Successful I. cast covered stent deployment within the previously placed right common iliac artery stent for protein in-stent restenosis and throat documented by duplex ultrasound and pullback pressure gradient. Successful nitinol self expanding stent and angioplasty of a de novo lesion in the left common iliac artery. Patient had excellent graft result. There were no hematologic radiograph complications. The patient with blood in stable condition. The sheath will be removed procedure 3 hours her pressure will be held with each run to achieve hemostasis. Facial dehydrated overnight because of his previously document renal  deficiency and discharged him and his groins and renal status remained stable. He is already on aspirin and apparently is allergic to Plavix. He will get Followup Doppler's in our office and I will see him back after that.  Runell Gess MD, [facility]  Treatments: Amlodipine, ASA, clonidine, hydration with NS. I-Cast covered stent deployment within the previously placed right common iliac artery stent.  Discharge Exam: Blood pressure 141/65, pulse 69, temperature 98.6 F (37 C), temperature source Oral, resp. rate 28, height 5\' 8"  (1.727 m), weight 110.542 kg (243 lb 11.2 oz), SpO2 94.00%.   Disposition: 06-Home-Health Care Svc  Discharge Orders    Future Appointments: Provider: Department: Dept Phone: Center:   01/07/2012 10:15 AM Waymon Budge, MD [facility] [phone] None     Future Orders Please Complete By Expires   Diet - low sodium heart healthy      Increase activity slowly      Discharge instructions      Comments:   No lifting more than a gallon of milk or driving for three days.     Medication List  As of 12/11/2011 12:34 PM   STOP taking these medications         lisinopril 20 MG tablet      pantoprazole 20 MG tablet         TAKE these medications         acetaminophen 650 MG CR tablet   Commonly known as: TYLENOL   Take 1,300 mg by mouth 2 (two) times daily.  albuterol (2.5 MG/3ML) 0.083% nebulizer solution   Commonly known as: PROVENTIL   Take 2.5 mg by nebulization every 6 (six) hours as needed. For shortness of breath      albuterol 108 (90 BASE) MCG/ACT inhaler   Commonly known as: PROVENTIL HFA;VENTOLIN HFA   Inhale 2 puffs into the lungs every 4 (four) hours as needed for wheezing or shortness of breath.      allopurinol 300 MG tablet   Commonly known as: ZYLOPRIM   Take 1 tablet by mouth Twice daily.      amLODipine 10 MG tablet   Commonly known as: NORVASC   Take 1 tablet (10 mg total) by mouth daily.      aspirin 325 MG tablet    Take 325 mg by mouth daily.      atorvastatin 20 MG tablet   Commonly known as: LIPITOR   Take 1 tablet by mouth daily.      bumetanide 2 MG tablet   Commonly known as: BUMEX   Take 1.5 tablets by mouth Twice daily.      cloNIDine HCl 0.1 MG Tb12 ER tablet   Commonly known as: KAPVAY   Take 1 tablet (0.1 mg total) by mouth 2 (two) times daily.      colchicine 0.6 MG tablet   Take 0.6 mg by mouth daily.      diclofenac 0.1 % ophthalmic solution   Commonly known as: VOLTAREN   Place 1 drop into both eyes 2 (two) times daily.      escitalopram 10 MG tablet   Commonly known as: LEXAPRO   Take 20 mg by mouth daily.      ezetimibe 10 MG tablet   Commonly known as: ZETIA   Take 10 mg by mouth daily.      folic acid 400 MCG tablet   Commonly known as: FOLVITE   Take 400 mcg by mouth daily.      gabapentin 100 MG tablet   Commonly known as: NEURONTIN   Take 100 mg by mouth at bedtime.      isosorbide mononitrate 30 MG 24 hr tablet   Commonly known as: IMDUR   Take 60 mg by mouth daily.      LANTUS 100 UNIT/ML injection   Generic drug: insulin glargine   Inject 53-56 Units into the skin 2 (two) times daily. 53 units in the morning and 56 units in the evening      multivitamin tablet   Take 0.5 tablets by mouth 2 (two) times daily.      nitroGLYCERIN 0.4 MG SL tablet   Commonly known as: NITROSTAT   Place 0.4 mg under the tongue every 5 (five) minutes as needed. May repeat x 3      NOVOLOG FLEXPEN 100 UNIT/ML injection   Generic drug: insulin aspart   Inject 10-20 Units into the skin 3 (three) times daily. 10 units in the morning,20 units at noon, and 20 in the evening before meals      pantoprazole 40 MG tablet   Commonly known as: PROTONIX   Take 40 mg by mouth daily.      predniSONE 5 MG tablet   Commonly known as: DELTASONE   Take 5 mg by mouth daily.      tiotropium 18 MCG inhalation capsule   Commonly known as: SPIRIVA   Place 18 mcg into inhaler and  inhale daily.           Follow-up Information  Follow up with Runell Gess, MD. (Lower extremity areterial Ultrasound. Our office will call you with the appointment dates and times.)    Contact information:   [address] [place] [address] [phone]          Signed: Wilburt Finlay 12/11/2011, 12:34 PM  I have seen & examined the patient this AM along with Mr. Leron Croak. I agree with his discharge summary.    He was admitted for pre-cath hydration, but initially his renal function appeared worse. His procedure was then postponed until yesterday.  Groins have mild bruising, but are non-tender, no hematoma. Creatinine has improved dramatically.  Provided he is able to be up & ambulate in the halls & his BPs are <151mmHg by this PM, he should be fine for discharge.  Idid confer with Dr. Allyson Sabal re ? Adding an additional antiplatelet agent (has Plavix allergy) -- he feels that the patient should be fine on ASA alone. Agree with increased Amlodipine dose -- not many options for BP control.  Continue home insulin Rx. Continue Statin for HLD.   Marykay Lex, M.D., M.S. [facility] [address] [place], [place]  [address]  [phone] Pager # [phone]  12/11/2011 1:02 PM

## 2011-12-31 ENCOUNTER — Emergency Department (HOSPITAL_COMMUNITY): Payer: BC Managed Care – PPO

## 2011-12-31 ENCOUNTER — Inpatient Hospital Stay (HOSPITAL_COMMUNITY)
Admission: EM | Admit: 2011-12-31 | Discharge: 2012-01-03 | DRG: 127 | Disposition: A | Discharge status: Home / Self Care | Payer: BC Managed Care – PPO | Attending: Internal Medicine | Admitting: Internal Medicine

## 2011-12-31 ENCOUNTER — Encounter (HOSPITAL_COMMUNITY): Payer: Self-pay

## 2011-12-31 DIAGNOSIS — M129 Arthropathy, unspecified: Secondary | ICD-10-CM | POA: Diagnosis present

## 2011-12-31 DIAGNOSIS — R5381 Other malaise: Secondary | ICD-10-CM | POA: Diagnosis present

## 2011-12-31 DIAGNOSIS — Z860100 Personal history of colon polyps, unspecified: Secondary | ICD-10-CM

## 2011-12-31 DIAGNOSIS — I509 Heart failure, unspecified: Secondary | ICD-10-CM | POA: Diagnosis present

## 2011-12-31 DIAGNOSIS — I739 Peripheral vascular disease, unspecified: Secondary | ICD-10-CM | POA: Diagnosis present

## 2011-12-31 DIAGNOSIS — K5900 Constipation, unspecified: Secondary | ICD-10-CM | POA: Diagnosis present

## 2011-12-31 DIAGNOSIS — Z951 Presence of aortocoronary bypass graft: Secondary | ICD-10-CM

## 2011-12-31 DIAGNOSIS — Z87891 Personal history of nicotine dependence: Secondary | ICD-10-CM

## 2011-12-31 DIAGNOSIS — E119 Type 2 diabetes mellitus without complications: Secondary | ICD-10-CM | POA: Diagnosis present

## 2011-12-31 DIAGNOSIS — K219 Gastro-esophageal reflux disease without esophagitis: Secondary | ICD-10-CM | POA: Diagnosis present

## 2011-12-31 DIAGNOSIS — R0602 Shortness of breath: Secondary | ICD-10-CM

## 2011-12-31 DIAGNOSIS — N183 Chronic kidney disease, stage 3 unspecified: Secondary | ICD-10-CM | POA: Diagnosis present

## 2011-12-31 DIAGNOSIS — Z794 Long term (current) use of insulin: Secondary | ICD-10-CM

## 2011-12-31 DIAGNOSIS — K922 Gastrointestinal hemorrhage, unspecified: Secondary | ICD-10-CM | POA: Diagnosis present

## 2011-12-31 DIAGNOSIS — Z79899 Other long term (current) drug therapy: Secondary | ICD-10-CM

## 2011-12-31 DIAGNOSIS — I519 Heart disease, unspecified: Secondary | ICD-10-CM | POA: Diagnosis present

## 2011-12-31 DIAGNOSIS — E669 Obesity, unspecified: Secondary | ICD-10-CM | POA: Diagnosis present

## 2011-12-31 DIAGNOSIS — I2510 Atherosclerotic heart disease of native coronary artery without angina pectoris: Secondary | ICD-10-CM | POA: Diagnosis present

## 2011-12-31 DIAGNOSIS — J449 Chronic obstructive pulmonary disease, unspecified: Secondary | ICD-10-CM | POA: Diagnosis present

## 2011-12-31 DIAGNOSIS — M109 Gout, unspecified: Secondary | ICD-10-CM | POA: Diagnosis present

## 2011-12-31 DIAGNOSIS — I129 Hypertensive chronic kidney disease with stage 1 through stage 4 chronic kidney disease, or unspecified chronic kidney disease: Secondary | ICD-10-CM | POA: Diagnosis present

## 2011-12-31 DIAGNOSIS — I5033 Acute on chronic diastolic (congestive) heart failure: Principal | ICD-10-CM | POA: Diagnosis present

## 2011-12-31 DIAGNOSIS — G4733 Obstructive sleep apnea (adult) (pediatric): Secondary | ICD-10-CM | POA: Diagnosis present

## 2011-12-31 DIAGNOSIS — N289 Disorder of kidney and ureter, unspecified: Secondary | ICD-10-CM | POA: Diagnosis present

## 2011-12-31 DIAGNOSIS — I498 Other specified cardiac arrhythmias: Secondary | ICD-10-CM | POA: Diagnosis present

## 2011-12-31 DIAGNOSIS — Z7982 Long term (current) use of aspirin: Secondary | ICD-10-CM

## 2011-12-31 DIAGNOSIS — I252 Old myocardial infarction: Secondary | ICD-10-CM

## 2011-12-31 DIAGNOSIS — I10 Essential (primary) hypertension: Secondary | ICD-10-CM | POA: Diagnosis present

## 2011-12-31 DIAGNOSIS — J4489 Other specified chronic obstructive pulmonary disease: Secondary | ICD-10-CM | POA: Diagnosis present

## 2011-12-31 DIAGNOSIS — G309 Alzheimer's disease, unspecified: Secondary | ICD-10-CM | POA: Diagnosis present

## 2011-12-31 DIAGNOSIS — F0280 Dementia in other diseases classified elsewhere without behavioral disturbance: Secondary | ICD-10-CM | POA: Diagnosis present

## 2011-12-31 DIAGNOSIS — E785 Hyperlipidemia, unspecified: Secondary | ICD-10-CM | POA: Diagnosis present

## 2011-12-31 HISTORY — DX: Other chronic pain: G89.29

## 2011-12-31 HISTORY — DX: Angina pectoris, unspecified: I20.9

## 2011-12-31 HISTORY — DX: Shortness of breath: R06.02

## 2011-12-31 HISTORY — DX: Unspecified malignant neoplasm of skin, unspecified: C44.90

## 2011-12-31 HISTORY — DX: Type 2 diabetes mellitus without complications: E11.9

## 2011-12-31 HISTORY — DX: Depression, unspecified: F32.A

## 2011-12-31 HISTORY — DX: Major depressive disorder, single episode, unspecified: F32.9

## 2011-12-31 HISTORY — PX: CORONARY ANGIOPLASTY WITH STENT PLACEMENT: SHX49

## 2011-12-31 HISTORY — DX: Low back pain: M54.5

## 2011-12-31 HISTORY — DX: Low back pain, unspecified: M54.50

## 2011-12-31 LAB — PROTIME-INR: Prothrombin Time: 14.2 seconds (ref 11.6–15.2)

## 2011-12-31 LAB — COMPREHENSIVE METABOLIC PANEL
ALT: 18 U/L (ref 0–53)
AST: 23 U/L (ref 0–37)
CO2: 26 mEq/L (ref 19–32)
Chloride: 101 mEq/L (ref 96–112)
Creatinine, Ser: 1.6 mg/dL — ABNORMAL HIGH (ref 0.50–1.35)
GFR calc non Af Amer: 40 mL/min — ABNORMAL LOW (ref >90)
Total Bilirubin: 0.3 mg/dL (ref 0.3–1.2)

## 2011-12-31 LAB — CBC WITH DIFFERENTIAL/PLATELET
Basophils Absolute: 0 10*3/uL (ref 0.0–0.1)
HCT: 30.8 % — ABNORMAL LOW (ref 39.0–52.0)
Hemoglobin: 9.7 g/dL — ABNORMAL LOW (ref 13.0–17.0)
Lymphocytes Relative: 13 % (ref 12–46)
Lymphs Abs: 1.4 10*3/uL (ref 0.7–4.0)
Monocytes Absolute: 1 10*3/uL (ref 0.1–1.0)
Neutro Abs: 8 10*3/uL — ABNORMAL HIGH (ref 1.7–7.7)
RBC: 3.19 MIL/uL — ABNORMAL LOW (ref 4.22–5.81)
RDW: 16.2 % — ABNORMAL HIGH (ref 11.5–15.5)
WBC: 10.9 10*3/uL — ABNORMAL HIGH (ref 4.0–10.5)

## 2011-12-31 MED ORDER — ASPIRIN 325 MG PO TABS
325.0000 mg | ORAL_TABLET | Freq: Every day | ORAL | Status: DC
  Administered 2012-01-01 – 2012-01-03 (×3): 325 mg via ORAL
  Filled 2011-12-31 (×4): qty 1

## 2011-12-31 MED ORDER — KETOROLAC TROMETHAMINE 0.5 % OP SOLN
1.0000 [drp] | Freq: Two times a day (BID) | OPHTHALMIC | Status: DC
  Administered 2011-12-31 – 2012-01-03 (×6): 1 [drp] via OPHTHALMIC
  Filled 2011-12-31: qty 3

## 2011-12-31 MED ORDER — GABAPENTIN 100 MG PO TABS: 100.0000 mg | ORAL_TABLET | Freq: Every day | ORAL | Status: DC

## 2011-12-31 MED ORDER — DICLOFENAC SODIUM 0.1 % OP SOLN
1.0000 [drp] | Freq: Two times a day (BID) | OPHTHALMIC | Status: DC
  Filled 2011-12-31 (×2): qty 2.5

## 2011-12-31 MED ORDER — ONDANSETRON HCL 4 MG/2ML IJ SOLN: 4.0000 mg | Freq: Four times a day (QID) | INTRAMUSCULAR | Status: DC | PRN

## 2011-12-31 MED ORDER — FOLIC ACID 400 MCG PO TABS: 400.0000 ug | ORAL_TABLET | Freq: Every day | ORAL | Status: DC

## 2011-12-31 MED ORDER — EZETIMIBE 10 MG PO TABS
10.0000 mg | ORAL_TABLET | Freq: Every day | ORAL | Status: DC
Start: 2011-12-31 — End: 2012-01-03
  Administered 2012-01-01 – 2012-01-03 (×3): 10 mg via ORAL
  Filled 2011-12-31 (×4): qty 1

## 2011-12-31 MED ORDER — ALBUTEROL SULFATE (5 MG/ML) 0.5% IN NEBU
2.5000 mg | INHALATION_SOLUTION | RESPIRATORY_TRACT | Status: DC | PRN
  Administered 2012-01-02: 2.5 mg via RESPIRATORY_TRACT
  Filled 2011-12-31: qty 0.5

## 2011-12-31 MED ORDER — INSULIN GLARGINE 100 UNIT/ML ~~LOC~~ SOLN
56.0000 [IU] | Freq: Every day | SUBCUTANEOUS | Status: DC
  Administered 2011-12-31 – 2012-01-02 (×3): 56 [IU] via SUBCUTANEOUS

## 2011-12-31 MED ORDER — SODIUM CHLORIDE 0.9 % IJ SOLN
3.0000 mL | Freq: Two times a day (BID) | INTRAMUSCULAR | Status: DC
  Administered 2011-12-31 – 2012-01-03 (×6): 3 mL via INTRAVENOUS

## 2011-12-31 MED ORDER — FOLIC ACID 0.5 MG HALF TAB
0.5000 mg | ORAL_TABLET | Freq: Every day | ORAL | Status: DC
  Administered 2012-01-01 – 2012-01-03 (×3): 0.5 mg via ORAL
  Filled 2011-12-31 (×4): qty 1

## 2011-12-31 MED ORDER — AMLODIPINE BESYLATE 10 MG PO TABS
10.0000 mg | ORAL_TABLET | Freq: Every day | ORAL | Status: DC
  Administered 2011-12-31 – 2012-01-03 (×4): 10 mg via ORAL
  Filled 2011-12-31 (×4): qty 1

## 2011-12-31 MED ORDER — INSULIN ASPART 100 UNIT/ML ~~LOC~~ SOLN
10.0000 [IU] | Freq: Every day | SUBCUTANEOUS | Status: DC
  Administered 2012-01-01 – 2012-01-03 (×3): 10 [IU] via SUBCUTANEOUS

## 2011-12-31 MED ORDER — POTASSIUM CHLORIDE CRYS ER 20 MEQ PO TBCR
20.0000 meq | EXTENDED_RELEASE_TABLET | Freq: Every day | ORAL | Status: DC
  Administered 2011-12-31 – 2012-01-01 (×2): 20 meq via ORAL
  Filled 2011-12-31 (×2): qty 1

## 2011-12-31 MED ORDER — ESCITALOPRAM OXALATE 20 MG PO TABS
20.0000 mg | ORAL_TABLET | Freq: Every day | ORAL | Status: DC
  Administered 2012-01-01 – 2012-01-03 (×3): 20 mg via ORAL
  Filled 2011-12-31 (×4): qty 1

## 2011-12-31 MED ORDER — COLCHICINE 0.6 MG PO TABS
0.6000 mg | ORAL_TABLET | Freq: Every day | ORAL | Status: DC
  Administered 2012-01-01 – 2012-01-03 (×3): 0.6 mg via ORAL
  Filled 2011-12-31 (×4): qty 1

## 2011-12-31 MED ORDER — ATORVASTATIN CALCIUM 20 MG PO TABS
20.0000 mg | ORAL_TABLET | Freq: Every day | ORAL | Status: DC
  Administered 2011-12-31 – 2012-01-03 (×4): 20 mg via ORAL
  Filled 2011-12-31 (×4): qty 1

## 2011-12-31 MED ORDER — ACETAMINOPHEN 325 MG PO TABS: 650.0000 mg | ORAL_TABLET | ORAL | Status: DC | PRN

## 2011-12-31 MED ORDER — ISOSORBIDE MONONITRATE ER 60 MG PO TB24
60.0000 mg | ORAL_TABLET | Freq: Every day | ORAL | Status: DC
  Administered 2012-01-01 – 2012-01-03 (×3): 60 mg via ORAL
  Filled 2011-12-31 (×4): qty 1

## 2011-12-31 MED ORDER — INSULIN GLARGINE 100 UNIT/ML ~~LOC~~ SOLN
53.0000 [IU] | Freq: Every day | SUBCUTANEOUS | Status: DC
  Administered 2012-01-01 – 2012-01-03 (×3): 53 [IU] via SUBCUTANEOUS

## 2011-12-31 MED ORDER — PANTOPRAZOLE SODIUM 40 MG PO TBEC
40.0000 mg | DELAYED_RELEASE_TABLET | Freq: Every day | ORAL | Status: DC
  Administered 2012-01-01 – 2012-01-03 (×3): 40 mg via ORAL
  Filled 2011-12-31 (×3): qty 1

## 2011-12-31 MED ORDER — FUROSEMIDE 10 MG/ML IJ SOLN
40.0000 mg | Freq: Two times a day (BID) | INTRAMUSCULAR | Status: DC
  Administered 2011-12-31 – 2012-01-02 (×4): 40 mg via INTRAVENOUS
  Filled 2011-12-31 (×5): qty 4

## 2011-12-31 MED ORDER — CLONIDINE HCL ER 0.1 MG PO TB12
0.1000 mg | ORAL_TABLET | Freq: Two times a day (BID) | ORAL | Status: DC
  Administered 2011-12-31: 0.1 mg via ORAL
  Filled 2011-12-31 (×3): qty 1

## 2011-12-31 MED ORDER — SODIUM CHLORIDE 0.9 % IV SOLN: 250.0000 mL | INTRAVENOUS | Status: DC | PRN

## 2011-12-31 MED ORDER — PREDNISONE 20 MG PO TABS
60.0000 mg | ORAL_TABLET | Freq: Once | ORAL | Status: AC
  Administered 2011-12-31: 60 mg via ORAL
  Filled 2011-12-31: qty 3

## 2011-12-31 MED ORDER — INSULIN GLARGINE 100 UNIT/ML ~~LOC~~ SOLN: 53.0000 [IU] | Freq: Two times a day (BID) | SUBCUTANEOUS | Status: DC

## 2011-12-31 MED ORDER — SODIUM CHLORIDE 0.9 % IJ SOLN: 3.0000 mL | INTRAMUSCULAR | Status: DC | PRN

## 2011-12-31 MED ORDER — ALBUTEROL SULFATE (5 MG/ML) 0.5% IN NEBU
5.0000 mg | INHALATION_SOLUTION | Freq: Once | RESPIRATORY_TRACT | Status: AC
  Administered 2011-12-31: 5 mg via RESPIRATORY_TRACT
  Filled 2011-12-31: qty 1

## 2011-12-31 MED ORDER — INSULIN ASPART 100 UNIT/ML ~~LOC~~ SOLN: 10.0000 [IU] | Freq: Three times a day (TID) | SUBCUTANEOUS | Status: DC

## 2011-12-31 MED ORDER — INSULIN ASPART 100 UNIT/ML ~~LOC~~ SOLN
20.0000 [IU] | Freq: Two times a day (BID) | SUBCUTANEOUS | Status: DC
  Administered 2012-01-01 – 2012-01-03 (×4): 20 [IU] via SUBCUTANEOUS

## 2011-12-31 MED ORDER — TIOTROPIUM BROMIDE MONOHYDRATE 18 MCG IN CAPS
18.0000 ug | ORAL_CAPSULE | Freq: Every day | RESPIRATORY_TRACT | Status: DC
  Administered 2012-01-01 – 2012-01-03 (×3): 18 ug via RESPIRATORY_TRACT
  Filled 2011-12-31: qty 5

## 2011-12-31 MED ORDER — GABAPENTIN 100 MG PO CAPS
100.0000 mg | ORAL_CAPSULE | Freq: Every day | ORAL | Status: DC
  Administered 2011-12-31 – 2012-01-02 (×3): 100 mg via ORAL
  Filled 2011-12-31 (×4): qty 1

## 2011-12-31 MED ORDER — ALLOPURINOL 300 MG PO TABS
300.0000 mg | ORAL_TABLET | Freq: Every day | ORAL | Status: DC
  Administered 2012-01-01 – 2012-01-03 (×3): 300 mg via ORAL
  Filled 2011-12-31 (×4): qty 1

## 2011-12-31 NOTE — ED Notes (Signed)
Patient transported from X-ray 

## 2011-12-31 NOTE — ED Notes (Signed)
Patient transported to X-ray 

## 2011-12-31 NOTE — ED Notes (Signed)
Cardiology at the bedside.

## 2011-12-31 NOTE — Progress Notes (Addendum)
Disposition note  78 year old male patient presented to the emergency department with 2 days of progressive shortness of breath as well as some intermittent chest pain that was mild and described a sharp. He denies any wheezing but developed orthopnea overnight and has noticed increase in usual lower extremity edema over the past 2 days. Past medical history significant for CAD,COPD as well as prior episodes for CHF likely due to diastolic dysfunction. Last echocardiogram was in 2011 that demonstrated moderate concentric LVH which would be consistent with diastolic dysfunction at least grade 1. He was most recently hospitalized by [facility] cardiology for symptomatic peripheral vascular disease and was discharged 12/11/2011. During that hospitalization he underwent iliac artery stenting 2/2 in-stent restenosis. At time of discharge his preadmission lisinopril and Protonix were discontinued and no new medications were started.  ER chart including vital signs,labs, CXR film and recent treatments reviewed. Attending ER physician felt patient may have CHF exacerbation as well as concomitant COPD exacerbation. He has received nebulizer therapy and one dose of IV steroids. I did review admission EKG which demonstrated ST segment downsloping with T-wave inversion in inferolateral leads which was new compared to last EKG performed July 2013. I spoke with the resident Dr. Christain Sacramento and made him aware of my concerns that this may be a primary cardiology problem related to at least CHF brought about by COPD exacerbation and possibly early cardiac ischemia and requested that he contact [facility] to evaluate the patient and determine if he was more appropriate for cardiology admission.   Junious Silk, ANP [phone]

## 2011-12-31 NOTE — ED Provider Notes (Signed)
I saw and evaluated the patient, reviewed the resident's note and I agree with the findings and plan.   Pt with cough and worsening wheezing, suspect combination or early chf/copd--will admit to medicine  Toy Baker, MD 12/31/11 1424

## 2011-12-31 NOTE — ED Notes (Signed)
Pt reports waking 62 this am w/mid-sternum chest pain, SOB, headache, dizziness, weakness, and nausea. Pt denies abd/back/arm pain, V/D. Pt reports 2 aortic stent placed last month

## 2011-12-31 NOTE — ED Provider Notes (Signed)
History     CSN: [id number]  Arrival date & time 12/31/11  1107   First MD Initiated Contact with Patient 12/31/11 1130      Chief Complaint  Patient presents with  . Chest Pain    (Consider location/radiation/quality/duration/timing/severity/associated sxs/prior treatment) Patient is a 78 y.o. male presenting with shortness of breath and chest pain. The history is provided by the patient and the spouse.  Shortness of Breath  The current episode started 2 days ago. The onset was gradual. The problem occurs frequently. The problem has been gradually worsening. The problem is moderate. Nothing relieves the symptoms. The symptoms are aggravated by activity and a supine position. Associated symptoms include chest pain, orthopnea, cough and shortness of breath. Pertinent negatives include no chest pressure, no fever and no wheezing. The cough is non-productive. Past medical history comments: CHF, COPD, CAD, PVD.  Chest Pain The chest pain began 3 - 5 hours ago. Chest pain occurs intermittently. The chest pain is resolved. The severity of the pain is mild. The quality of the pain is described as sharp. The pain does not radiate. Primary symptoms include shortness of breath, cough and nausea. Pertinent negatives for primary symptoms include no fever, no wheezing, no palpitations, no abdominal pain and no vomiting.  Associated symptoms include lower extremity edema and orthopnea. He tried nothing for the symptoms. Risk factors include lack of exercise, male gender and sedentary lifestyle.  His past medical history is significant for CAD, COPD, CHF, diabetes and PVD. Past medical history comments: CHF, COPD, CAD, PVD     Past Medical History  Diagnosis Date  . Adenomatous colon polyp   . Diverticulosis   . GERD (gastroesophageal reflux disease)   . Hiatal hernia   . Alzheimer disease   . Gout   . Renal insufficiency   . DM (diabetes mellitus)   . Asthma with bronchitis   . COPD (chronic  obstructive pulmonary disease)   . Chronic hypoxemic respiratory failure   . OSA (obstructive sleep apnea)   . CHF (congestive heart failure)   . Blood transfusion   . Arthritis   . Myocardial infarction   . Pneumonia   . Anemia   . GI bleed   . Coronary artery disease   . Hypertension   . Dysrhythmia   . Peripheral vascular disease     PAD    Past Surgical History  Procedure Date  . Coronary artery bypass graft   . Hand surgery   . Laparoscopic cholecystectomy   . Esophagogastroduodenoscopy 05/05/2011    Procedure: ESOPHAGOGASTRODUODENOSCOPY (EGD);  Surgeon: Louis Meckel, MD;  Location: [facility];  Service: Endoscopy;  Laterality: N/A;  . Flexible sigmoidoscopy 05/05/2011    Procedure: FLEXIBLE SIGMOIDOSCOPY;  Surgeon: Louis Meckel, MD;  Location: [facility];  Service: Endoscopy;  Laterality: N/A;  . Coronary angioplasty     Family History  Problem Relation Age of Onset  . Lung cancer Brother   . Diabetes Mother   . Heart disease Sister   . Stomach cancer Maternal Grandfather   . Heart disease Father   . Heart disease Brother   . Arthritis Mother   . ALS Sister   . ALS      neice  . ALS      nephew    History  Substance Use Topics  . Smoking status: Former Games developer  . Smokeless tobacco: Former Neurosurgeon    Quit date: 06/01/1990  . Alcohol Use: No  Review of Systems  Constitutional: Negative for fever and chills.  HENT: Negative.   Respiratory: Positive for cough and shortness of breath. Negative for chest tightness and wheezing.   Cardiovascular: Positive for chest pain, orthopnea and leg swelling. Negative for palpitations.  Gastrointestinal: Positive for nausea. Negative for vomiting, abdominal pain, diarrhea and constipation.  Genitourinary: Negative.   Musculoskeletal: Negative.   Skin: Negative.   Neurological: Negative.   All other systems reviewed and are negative.    Allergies  Cefuroxime; Donepezil; Heparin; Hydralazine;  Hydromorphone; Metoprolol; Morphine; Plavix; Promethazine; Sulfonamide derivatives; Zaroxolyn; and Ertapenem  Home Medications   Current Outpatient Rx  Name Route Sig Dispense Refill  . ACETAMINOPHEN ER 650 MG PO TBCR Oral Take 1,300 mg by mouth 2 (two) times daily.     . ALBUTEROL SULFATE HFA 108 (90 BASE) MCG/ACT IN AERS Inhalation Inhale 2 puffs into the lungs every 4 (four) hours as needed for wheezing or shortness of breath. 3 Inhaler 3  . ALBUTEROL SULFATE (2.5 MG/3ML) 0.083% IN NEBU Nebulization Take 2.5 mg by nebulization every 6 (six) hours as needed. For shortness of breath    . ALLOPURINOL 300 MG PO TABS Oral Take 1 tablet by mouth Twice daily.    Marland Kitchen AMLODIPINE BESYLATE 10 MG PO TABS Oral Take 1 tablet (10 mg total) by mouth daily. 30 tablet 5  . ASPIRIN 325 MG PO TABS Oral Take 325 mg by mouth daily.      . ATORVASTATIN CALCIUM 20 MG PO TABS Oral Take 1 tablet by mouth daily.    . BUMETANIDE 2 MG PO TABS Oral Take 1.5 tablets by mouth Twice daily.     Marland Kitchen CLONIDINE HCL ER 0.1 MG PO TB12 Oral Take 1 tablet (0.1 mg total) by mouth 2 (two) times daily. 60 tablet 5  . COLCHICINE 0.6 MG PO TABS Oral Take 0.6 mg by mouth daily.     Marland Kitchen DICLOFENAC SODIUM 0.1 % OP SOLN Both Eyes Place 1 drop into both eyes 2 (two) times daily.      Marland Kitchen ESCITALOPRAM OXALATE 10 MG PO TABS Oral Take 20 mg by mouth daily.     Marland Kitchen EZETIMIBE 10 MG PO TABS Oral Take 10 mg by mouth daily.      Marland Kitchen FOLIC ACID 400 MCG PO TABS Oral Take 400 mcg by mouth daily.      Marland Kitchen GABAPENTIN 100 MG PO TABS Oral Take 100 mg by mouth at bedtime.      . INSULIN GLARGINE 100 UNIT/ML [place] SOLN Subcutaneous Inject 53-56 Units into the skin 2 (two) times daily. 53 units in the morning and 56 units in the evening    . ISOSORBIDE MONONITRATE ER 30 MG PO TB24 Oral Take 60 mg by mouth daily.     Marland Kitchen ONE-DAILY MULTI VITAMINS PO TABS Oral Take 0.5 tablets by mouth 2 (two) times daily.     Marland Kitchen NITROGLYCERIN 0.4 MG SL SUBL Sublingual Place 0.4 mg under the  tongue every 5 (five) minutes as needed. May repeat x 3 for chest pain    . NOVOLOG FLEXPEN 100 UNIT/ML [place] SOLN Subcutaneous Inject 10-20 Units into the skin 3 (three) times daily. 10 units in the morning,20 units at noon, and 20 in the evening before meals    . PANTOPRAZOLE SODIUM 40 MG PO TBEC Oral Take 40 mg by mouth daily.      Marland Kitchen PREDNISONE 5 MG PO TABS Oral Take 5 mg by mouth daily.      Marland Kitchen  TIOTROPIUM BROMIDE MONOHYDRATE 18 MCG IN CAPS Inhalation Place 18 mcg into inhaler and inhale daily.        BP 150/58  Pulse 62  Temp 98.5 F (36.9 C) (Oral)  Resp 17  SpO2 100%  Physical Exam  Nursing note and vitals reviewed. Constitutional: He is oriented to person, place, and time. He appears well-developed and well-nourished. No distress.  HENT:  Head: Normocephalic and atraumatic.  Eyes: Conjunctivae are normal.  Neck: Neck supple.  Cardiovascular: Normal rate, regular rhythm, normal heart sounds and intact distal pulses.   Pulmonary/Chest: Effort normal and breath sounds normal. He has no wheezes. He has no rales.  Abdominal: Soft. He exhibits distension. There is no tenderness.  Musculoskeletal: Normal range of motion. He exhibits edema (2+ pitting edema to the knees b/l).  Neurological: He is alert and oriented to person, place, and time.  Skin: Skin is warm and dry.    ED Course  Procedures (including critical care time)  Labs Reviewed  CBC WITH DIFFERENTIAL - Abnormal; Notable for the following:    WBC 10.9 (*)     RBC 3.19 (*)     Hemoglobin 9.7 (*)     HCT 30.8 (*)     RDW 16.2 (*)     Neutro Abs 8.0 (*)     All other components within normal limits  COMPREHENSIVE METABOLIC PANEL - Abnormal; Notable for the following:    Glucose, Bld 189 (*)     BUN 31 (*)     Creatinine, Ser 1.60 (*)     Albumin 3.4 (*)     GFR calc non Af Amer 40 (*)     GFR calc Af Amer 47 (*)     All other components within normal limits  PRO B NATRIURETIC PEPTIDE - Abnormal; Notable for the  following:    Pro B Natriuretic peptide (BNP) 992.2 (*)     All other components within normal limits  PROTIME-INR  POCT I-STAT TROPONIN I   Dg Chest 2 View  12/31/2011  *RADIOLOGY REPORT*  Clinical Data: Shortness of breath.  CHF.  Hypertension.  CHEST - 2 VIEW  Comparison: 12/08/2011.  Findings: Stable enlarged cardiac silhouette and post CABG changes. Stable prominence of the pulmonary vasculature and interstitial markings.  No pleural fluid.  Diffuse osteopenia.  IMPRESSION: Stable cardiomegaly, mild pulmonary vascular congestion and chronic interstitial lung disease.  No acute abnormality.  Original Report Authenticated By: Darrol Angel, M.D.     1. COPD exacerbation 2. CHF exacerbation    MDM  78 yo male with PMHx of CHF, COPD, OSA, DM, PVD presents with 2-3 day history of worsening shortness of breath and orthopnea.  Pt has had associated worsening of LE edema.  This morning had one brief episode of sharp chest pain that lasted a few minutes and resolved prior to arrival.  AF, VSS at presentation.  Presentation concerning for COPD and CHF exacerbation.  Will give nebs and prednisone.  CMP consistent with prior.  BNP elevated at 992.  Troponin negative.  CXR with b/l interstitial infiltrates.  No signs of consolidation.  Presentation consistent with COPD and CHF exacerbation.  Cariology consulted and will evaluate the pt in the ED.  Cardiology to admit pt for further management.        Cherre Robins, MD 12/31/11 1626

## 2011-12-31 NOTE — H&P (Signed)
[name] is an 78 y.o. male.   Chief Complaint:  SOB, PND, orthopnea HPI:   Patient is a 78 year old obese Caucasian male with history of coronary artery disease status post coronary artery bypass grafting in 1997 with LIMA to the LAD and SVG to the obtuse marginal branches sequentially. History also includes hypertension, diabetes, hyperlipidemia, COPD, obstructive sleep apnea for which he uses a CPAP, as well as peripheral vascular disease. In both iliac artery stented back in 2010 as well as left renal artery. He does have angiographically documented 60-70% ostial right subclavian artery stenosis with post stenotic dilation. He has mild to moderate right internal carotid artery stenosis. His most recent ABIs show evidence of in-stent restenosis within the proximal iliac stents. Patient has been treated last 4 days for bronchitis with levofloxacin.  He was recently discharged on 7/19 after receiving stents to the iliac arteries for in-stent restenosis.  The patient presented today with increasing SOB, orthopnea, LEE, PND.  He admits to adding salt to his food.  He also complains of CP x2 (sharp, lasting a few minutes), nausea, vomiting, HA, neck pain, cough and constipation.  He denies abd pain, dysuria, hematuria, hematochezia, melena.  He has required increased home O2 to help breathing.    Past Medical History  Diagnosis Date  . Adenomatous colon polyp   . Diverticulosis   . GERD (gastroesophageal reflux disease)   . Hiatal hernia   . Alzheimer disease   . Gout   . Renal insufficiency   . DM (diabetes mellitus)   . Asthma with bronchitis   . COPD (chronic obstructive pulmonary disease)   . Chronic hypoxemic respiratory failure   . OSA (obstructive sleep apnea)   . CHF (congestive heart failure)   . Blood transfusion   . Arthritis   . Myocardial infarction   . Pneumonia   . Anemia   . GI bleed   . Coronary artery disease   . Hypertension   . Dysrhythmia   . Peripheral  vascular disease     PAD    Past Surgical History  Procedure Date  . Coronary artery bypass graft   . Hand surgery   . Laparoscopic cholecystectomy   . Esophagogastroduodenoscopy 05/05/2011    Procedure: ESOPHAGOGASTRODUODENOSCOPY (EGD);  Surgeon: Louis Meckel, MD;  Location: [facility];  Service: Endoscopy;  Laterality: N/A;  . Flexible sigmoidoscopy 05/05/2011    Procedure: FLEXIBLE SIGMOIDOSCOPY;  Surgeon: Louis Meckel, MD;  Location: [facility];  Service: Endoscopy;  Laterality: N/A;  . Coronary angioplasty     Family History  Problem Relation Age of Onset  . Lung cancer Brother   . Diabetes Mother   . Heart disease Sister   . Stomach cancer Maternal Grandfather   . Heart disease Father   . Heart disease Brother   . Arthritis Mother   . ALS Sister   . ALS      neice  . ALS      nephew   Social History:  reports that he has quit smoking. He quit smokeless tobacco use about 21 years ago. He reports that he does not drink alcohol or use illicit drugs.  Allergies:  Allergies  Allergen Reactions  . Cefuroxime Other (See Comments)    REACTION: unkown reaction  . Donepezil Diarrhea  . Heparin Itching  . Hydralazine Other (See Comments)    DO NOT TAKE PER MD  . Hydromorphone Itching  . Metoprolol Other (See Comments)    unknown  .  Morphine Itching    REACTION: itching  . Plavix (Clopidogrel Bisulfate) Other (See Comments)    GI BLEEDING  . Promethazine Other (See Comments)    DO NOT TAKE PER MD  . Sulfonamide Derivatives Itching  . Zaroxolyn (Metolazone) Diarrhea  . Ertapenem Itching and Rash     (Not in a hospital admission)  Results for orders placed during the hospital encounter of 12/31/11 (from the past 48 hour(s))  CBC WITH DIFFERENTIAL     Status: Abnormal   Collection Time   12/31/11 12:17 PM      Component Value Range Comment   WBC 10.9 (*) 4.0 - 10.5 K/uL    RBC 3.19 (*) 4.22 - 5.81 MIL/uL    Hemoglobin 9.7 (*) 13.0 - 17.0 g/dL    HCT  45.4 (*) 09.8 - 52.0 %    MCV 96.6  78.0 - 100.0 fL    MCH 30.4  26.0 - 34.0 pg    MCHC 31.5  30.0 - 36.0 g/dL    RDW 11.9 (*) 14.7 - 15.5 %    Platelets 175  150 - 400 K/uL    Neutrophils Relative 73  43 - 77 %    Neutro Abs 8.0 (*) 1.7 - 7.7 K/uL    Lymphocytes Relative 13  12 - 46 %    Lymphs Abs 1.4  0.7 - 4.0 K/uL    Monocytes Relative 9  3 - 12 %    Monocytes Absolute 1.0  0.1 - 1.0 K/uL    Eosinophils Relative 4  0 - 5 %    Eosinophils Absolute 0.5  0.0 - 0.7 K/uL    Basophils Relative 0  0 - 1 %    Basophils Absolute 0.0  0.0 - 0.1 K/uL   COMPREHENSIVE METABOLIC PANEL     Status: Abnormal   Collection Time   12/31/11 12:17 PM      Component Value Range Comment   Sodium 141  135 - 145 mEq/L    Potassium 4.7  3.5 - 5.1 mEq/L    Chloride 101  96 - 112 mEq/L    CO2 26  19 - 32 mEq/L    Glucose, Bld 189 (*) 70 - 99 mg/dL    BUN 31 (*) 6 - 23 mg/dL    Creatinine, Ser 8.29 (*) 0.50 - 1.35 mg/dL    Calcium 9.5  8.4 - 56.2 mg/dL    Total Protein 6.6  6.0 - 8.3 g/dL    Albumin 3.4 (*) 3.5 - 5.2 g/dL    AST 23  0 - 37 U/L    ALT 18  0 - 53 U/L    Alkaline Phosphatase 80  39 - 117 U/L    Total Bilirubin 0.3  0.3 - 1.2 mg/dL    GFR calc non Af Amer 40 (*) >90 mL/min    GFR calc Af Amer 47 (*) >90 mL/min   PRO B NATRIURETIC PEPTIDE     Status: Abnormal   Collection Time   12/31/11 12:17 PM      Component Value Range Comment   Pro B Natriuretic peptide (BNP) 992.2 (*) 0 - 450 pg/mL   PROTIME-INR     Status: Normal   Collection Time   12/31/11 12:17 PM      Component Value Range Comment   Prothrombin Time 14.2  11.6 - 15.2 seconds    INR 1.08  0.00 - 1.49   POCT I-STAT TROPONIN I     Status: Normal  Collection Time   12/31/11 12:30 PM      Component Value Range Comment   Troponin i, poc 0.03  0.00 - 0.08 ng/mL    Comment 3             Dg Chest 2 View  12/31/2011  *RADIOLOGY REPORT*  Clinical Data: Shortness of breath.  CHF.  Hypertension.  CHEST - 2 VIEW  Comparison:  12/08/2011.  Findings: Stable enlarged cardiac silhouette and post CABG changes. Stable prominence of the pulmonary vasculature and interstitial markings.  No pleural fluid.  Diffuse osteopenia.  IMPRESSION: Stable cardiomegaly, mild pulmonary vascular congestion and chronic interstitial lung disease.  No acute abnormality.  Original Report Authenticated By: Darrol Angel, M.D.    Review of Systems  Constitutional: Negative for fever and diaphoresis.  HENT: Positive for neck pain. Negative for congestion and sore throat.   Respiratory: Positive for cough and shortness of breath.   Cardiovascular: Positive for chest pain, orthopnea, leg swelling and PND.  Gastrointestinal: Positive for nausea, vomiting and constipation. Negative for abdominal pain, diarrhea, blood in stool and melena.  Genitourinary: Negative for dysuria and hematuria.  Neurological: Positive for headaches. Negative for dizziness.    Blood pressure 150/58, pulse 62, temperature 98.5 F (36.9 C), temperature source Oral, resp. rate 17, SpO2 100.00%. Physical Exam  Constitutional: He is oriented to person, place, and time. No distress.       Obese, deconditioned male  HENT:  Head: Normocephalic and atraumatic.  Mouth/Throat: Oropharynx is clear and moist. No oropharyngeal exudate.  Eyes: EOM are normal. Pupils are equal, round, and reactive to light. No scleral icterus.  Neck: Normal range of motion. Neck supple.  Cardiovascular: Normal rate, S1 normal and S2 normal.  An irregularly irregular rhythm present.  Murmur heard.  Systolic murmur is present with a grade of 1/6  Pulses:      Carotid pulses are on the right side with bruit, and on the left side with bruit.      Radial pulses are 2+ on the right side, and 2+ on the left side.       Dorsalis pedis pulses are 2+ on the right side, and 2+ on the left side.       Posterior tibial pulses are 0 on the right side, and 0 on the left side.  Respiratory: Effort normal. He  has rales.  GI: Bowel sounds are normal. He exhibits distension. There is no tenderness.  Musculoskeletal: He exhibits edema.       2+ LEE   Lymphadenopathy:    He has no cervical adenopathy.  Neurological: He is alert and oriented to person, place, and time.  Skin: Skin is warm and dry.  Psychiatric: He has a normal mood and affect.     Assessment/Plan Patient Active Hospital Problem List: Acute on chronic diastolic heart failure (12/31/2011) DM (05/31/2008) OBSTRUCTIVE SLEEP APNEA (01/23/2010) PVD, widespread, recent OP dopplers suggest ISR bilat Iliac stents. (12/07/2011) S/P CABG x 3 '97, PCI '09, cath 4/11- medical Rx (12/07/2011) HTN (hypertension) (12/07/2011) HLD (hyperlipidemia) (12/07/2011) Acute on chronic renal insufficiency, baseline Scr 1.6 (12/08/2011)  Plan:  Admit to tele for IV diuresis.  Low sodium diet.  Strict I/O's and daily weight.  2D echo.     Britt Theard 12/31/2011, 3:54 PM

## 2011-12-31 NOTE — H&P (Signed)
Pt. Seen and examined. Agree with the NP/PA-C note as written. Well known to our practice. History of CAD, COPD, RH Failure, OSA, PAD s/p recent intervention as well as CKD and ARF s/p short term dialysis in the past for contrast nephropathy. He was recently discharged and has been noticing weight gain as well as constipation at home. He did increase his diuretics, however the weight gain seemed to outpace the ability of the diuretics to work. Plan admission for IV diuresis, medication adjustment and continued antibiotics for acute on chronic systolic congestive heart failure (with a predominant right heart component). Agree with re-check of 2D echo - last in the hospital was 2011, however, EF was not reported in that study.   Chrystie Nose, MD, [facility] Attending Cardiologist [facility]

## 2012-01-01 LAB — GLUCOSE, CAPILLARY
Glucose-Capillary: 294 mg/dL — ABNORMAL HIGH (ref 70–99)
Glucose-Capillary: 145 mg/dL — ABNORMAL HIGH (ref 70–99)
Glucose-Capillary: 170 mg/dL — ABNORMAL HIGH (ref 70–99)

## 2012-01-01 LAB — BASIC METABOLIC PANEL
CO2: 25 mEq/L (ref 19–32)
Calcium: 9.8 mg/dL (ref 8.4–10.5)
Creatinine, Ser: 1.59 mg/dL — ABNORMAL HIGH (ref 0.50–1.35)
GFR calc non Af Amer: 41 mL/min — ABNORMAL LOW (ref >90)
Glucose, Bld: 225 mg/dL — ABNORMAL HIGH (ref 70–99)

## 2012-01-01 MED ORDER — LISINOPRIL 5 MG PO TABS
5.0000 mg | ORAL_TABLET | Freq: Every day | ORAL | Status: DC
  Filled 2012-01-01: qty 1

## 2012-01-01 MED ORDER — LISINOPRIL 5 MG PO TABS
5.0000 mg | ORAL_TABLET | Freq: Every day | ORAL | Status: DC
  Administered 2012-01-02 – 2012-01-03 (×2): 5 mg via ORAL
  Filled 2012-01-01 (×2): qty 1

## 2012-01-01 MED ORDER — LOSARTAN POTASSIUM 25 MG PO TABS
25.0000 mg | ORAL_TABLET | Freq: Every day | ORAL | Status: DC
  Filled 2012-01-01: qty 1

## 2012-01-01 MED ORDER — CLONIDINE HCL 0.1 MG/24HR TD PTWK
0.1000 mg | MEDICATED_PATCH | TRANSDERMAL | Status: DC
  Administered 2012-01-01: 0.1 mg via TRANSDERMAL
  Filled 2012-01-01: qty 1

## 2012-01-01 NOTE — Progress Notes (Signed)
Subjective: Feels better no chest pain  Objective: Vital signs in last 24 hours: Temp:  [97.9 F (36.6 C)-98.5 F (36.9 C)] 98.3 F (36.8 C) (08/09 0618) Pulse Rate:  [59-73] 59  (08/09 0618) Resp:  [17-20] 18  (08/09 0618) BP: (137-151)/(34-67) 137/34 mmHg (08/09 0618) SpO2:  [96 %-100 %] 97 % (08/09 0837) Weight:  [111.222 kg (245 lb 3.2 oz)-112.3 kg (247 lb 9.2 oz)] 111.222 kg (245 lb 3.2 oz) (08/09 0618) Weight change:  Last BM Date: 12/31/11 Intake/Output from previous day: -965 08/08 0701 - 08/09 0700 In: 360 [P.O.:360] Out: 1325 [Urine:1325] Intake/Output this shift: Total I/O In: 240 [P.O.:240] Out: -   PE: General:Alert & oriented, feels better Heart:S1S2, RRR Lungs:diminished throughout,no wheezes Abd:+ BS, soft, non tender Ext:tr edema Neuro:alert and oriented   Lab Results:  Basename 12/31/11 1217  WBC 10.9*  HGB 9.7*  HCT 30.8*  PLT 175   BMET  Basename 01/01/12 0638 12/31/11 1217  NA 140 141  K 5.4* 4.7  CL 101 101  CO2 25 26  GLUCOSE 225* 189*  BUN 38* 31*  CREATININE 1.59* 1.60*  CALCIUM 9.8 9.5   No results found for this basename: TROPONINI:2,CK,MB:2 in the last 72 hours  Lab Results  Component Value Date   CHOL 149 11/21/2010   HDL 31* 11/21/2010   LDLCALC 47 11/21/2010   TRIG 356* 11/21/2010   CHOLHDL 4.8 11/21/2010   Lab Results  Component Value Date   HGBA1C 7.4* 05/07/2011     Lab Results  Component Value Date   TSH 1.948 11/21/2010    Hepatic Function Panel  Basename 12/31/11 1217  PROT 6.6  ALBUMIN 3.4*  AST 23  ALT 18  ALKPHOS 80  BILITOT 0.3  BILIDIR --  IBILI --   No results found for this basename: CHOL in the last 72 hours No results found for this basename: PROTIME in the last 72 hours    EKG: Orders placed during the hospital encounter of 12/31/11  . EKG 12-LEAD  . EKG 12-LEAD    Studies/Results: Dg Chest 2 View  12/31/2011  *RADIOLOGY REPORT*  Clinical Data: Shortness of breath.  CHF.   Hypertension.  CHEST - 2 VIEW  Comparison: 12/08/2011.  Findings: Stable enlarged cardiac silhouette and post CABG changes. Stable prominence of the pulmonary vasculature and interstitial markings.  No pleural fluid.  Diffuse osteopenia.  IMPRESSION: Stable cardiomegaly, mild pulmonary vascular congestion and chronic interstitial lung disease.  No acute abnormality.  Original Report Authenticated By: Darrol Angel, M.D.    Medications: I have reviewed the patient's current medications. Scheduled Meds:   . albuterol  5 mg Nebulization Once  . allopurinol  300 mg Oral Daily  . amLODipine  10 mg Oral Daily  . aspirin  325 mg Oral Daily  . atorvastatin  20 mg Oral Daily  . cloNIDine HCl  0.1 mg Oral BID  . colchicine  0.6 mg Oral Daily  . escitalopram  20 mg Oral Daily  . ezetimibe  10 mg Oral Daily  . folic acid  0.5 mg Oral Daily  . furosemide  40 mg Intravenous BID  . gabapentin  100 mg Oral QHS  . insulin aspart  10 Units Subcutaneous Q breakfast  . insulin aspart  20 Units Subcutaneous BID AC  . insulin glargine  53 Units Subcutaneous Daily  . insulin glargine  56 Units Subcutaneous QHS  . isosorbide mononitrate  60 mg Oral Daily  . ketorolac  1 drop  Both Eyes BID  . pantoprazole  40 mg Oral Daily  . potassium chloride  20 mEq Oral Daily  . predniSONE  60 mg Oral Once  . sodium chloride  3 mL Intravenous Q12H  . tiotropium  18 mcg Inhalation Daily  . DISCONTD: diclofenac  1 drop Both Eyes BID  . DISCONTD: folic acid  400 mcg Oral Daily  . DISCONTD: gabapentin  100 mg Oral QHS  . DISCONTD: insulin aspart  10-20 Units Subcutaneous TID  . DISCONTD: insulin glargine  53-56 Units Subcutaneous BID   Continuous Infusions:  PRN Meds:.sodium chloride, acetaminophen, albuterol, ondansetron (ZOFRAN) IV, sodium chloride  Assessment/Plan: Principal Problem:  *Acute on chronic diastolic heart failure Active Problems:  DM  OBSTRUCTIVE SLEEP APNEA  PVD, widespread, recent OP dopplers  suggest ISR bilat Iliac stents.  S/P CABG x 3 '97, PCI '09, cath 4/11- medical Rx  HTN (hypertension)  HLD (hyperlipidemia)  Acute on chronic renal insufficiency, baseline Scr 1.6  PLAN:  -965 ,  Wt 111.22 today down form 112.3 yesterday. On Lasix 40 mg IV BID Cr. 1.59 K+ 5.4   Received K+ today but will d/c for now K+ continues to rise.  Echo Ordered, last one I find in the system is 10/2010 with grade 2 diastolic dysfunction and EF 65-70%.   Ambulate with rehab.  LOS: 1 day   INGOLD,LAURA R 01/01/2012, 10:10 AM  I have seen & examined the patient this afternoon after Ms. Annie Paras, NP.  I agree with her findings, exam & recommendations. He is definitely feeling better.  No further CP & breathing improved. F/u Echo for LV Fxn - systolic & diastolic.   Continue IV Lasix today - consider changing to PO tomorrow.  Need to adjust antiHTN meds - not on BB due to bradycardia. (Metoprolol listed as allergy but reaction not listed, will need to investigate); Clonidine continued.  As Cr is stable, will restart Lisinopril (he was apparently on ACE-i prior to [facility] cath - not restarted).  On Amlodipine & Imdur & ASA.  D/c K+ replacement  Did not mention constipation -- will need bowel regimen  On Statin for HLD  Lantus + mealtime NovoLog insulin for DM.  PPI for GI prophylaxis. Is ambulating - but may want to consider SQ Heparin for DVT prophylaxis.  Marykay Lex, M.D., M.S. [facility] [address] [place], [place]  [address]  [phone] Pager # [phone]  01/01/2012 1:22 PM

## 2012-01-01 NOTE — Progress Notes (Signed)
Page MD to start PT on CPAP and heart diet/ w carb mod. MD give verbal order.

## 2012-01-01 NOTE — Progress Notes (Signed)
Pt 1600 BS 145 page MD about give 20u novlog ( pt home dose 22U) MD stated to give since PT eat all of dinner

## 2012-01-01 NOTE — Progress Notes (Signed)
  Echocardiogram 2D Echocardiogram has been performed.  [name] 01/01/2012, 12:00 PM

## 2012-01-02 DIAGNOSIS — E669 Obesity, unspecified: Secondary | ICD-10-CM | POA: Diagnosis present

## 2012-01-02 LAB — CBC
Platelets: 201 10*3/uL (ref 150–400)
RBC: 3.35 MIL/uL — ABNORMAL LOW (ref 4.22–5.81)
WBC: 11.5 10*3/uL — ABNORMAL HIGH (ref 4.0–10.5)

## 2012-01-02 LAB — GLUCOSE, CAPILLARY
Glucose-Capillary: 155 mg/dL — ABNORMAL HIGH (ref 70–99)
Glucose-Capillary: 67 mg/dL — ABNORMAL LOW (ref 70–99)
Glucose-Capillary: 95 mg/dL (ref 70–99)
Glucose-Capillary: 110 mg/dL — ABNORMAL HIGH (ref 70–99)
Glucose-Capillary: 98 mg/dL (ref 70–99)

## 2012-01-02 LAB — PRO B NATRIURETIC PEPTIDE
Pro B Natriuretic peptide (BNP): 1007 pg/mL — ABNORMAL HIGH (ref 0–450)
Pro B Natriuretic peptide (BNP): 945.2 pg/mL — ABNORMAL HIGH (ref 0–450)

## 2012-01-02 LAB — BASIC METABOLIC PANEL
CO2: 30 mEq/L (ref 19–32)
Calcium: 9.4 mg/dL (ref 8.4–10.5)
GFR calc Af Amer: 47 mL/min — ABNORMAL LOW (ref >90)
GFR calc non Af Amer: 41 mL/min — ABNORMAL LOW (ref >90)
Sodium: 144 mEq/L (ref 135–145)

## 2012-01-02 MED ORDER — FUROSEMIDE 40 MG PO TABS
40.0000 mg | ORAL_TABLET | Freq: Two times a day (BID) | ORAL | Status: DC
  Administered 2012-01-02 – 2012-01-03 (×2): 40 mg via ORAL
  Filled 2012-01-02 (×4): qty 1

## 2012-01-02 NOTE — Progress Notes (Signed)
hHypoglycemic Event  CBG: 66  Treatment: 15 GM carbohydrate snack  Symptoms: None  Follow-up CBG: [id number] CBG Result: 95  Possible Reasons for Event: Unknown  Comments/MD notified:Harding   Raneisha Bress, Brantley Stage  Remember to initiate Hypoglycemia Order Set & complete

## 2012-01-02 NOTE — Progress Notes (Signed)
Subjective:  Less SOB  Objective:  Vital Signs in the last 24 hours: Temp:  [97.6 F (36.4 C)-98.1 F (36.7 C)] 98.1 F (36.7 C) (08/10 1010) Pulse Rate:  [56-78] 78  (08/10 1010) Resp:  [18-21] 18  (08/10 1010) BP: (152-163)/(70-76) 163/76 mmHg (08/10 1010) SpO2:  [94 %-100 %] 96 % (08/10 1010) Weight:  [111.3 kg (245 lb 6 oz)] 111.3 kg (245 lb 6 oz) (08/10 0452)  Intake/Output from previous day:  Intake/Output Summary (Last 24 hours) at 01/02/12 1121 Last data filed at 01/02/12 1000  Gross per 24 hour  Intake    762 ml  Output   2175 ml  Net  -1413 ml    Physical Exam: General appearance: alert, cooperative, no distress and morbidly obese Lungs: clear to auscultation bilaterally Heart: regular rhythm and 2/6 systolic murmur ABD: protuberant, mild midline rectus aneursym; NABS, Non-tender. Extr: no C/C/E    Rate: 50-60; 78 om MD exam  Rhythm: Sinus brady  Lab Results:  Basename 01/02/12 0550 12/31/11 1217  WBC 11.5* 10.9*  HGB 10.2* 9.7*  PLT 201 175    Basename 01/02/12 0550 01/01/12 0638  NA 144 140  K 3.9 5.4*  CL 103 101  CO2 30 25  GLUCOSE 77 225*  BUN 44* 38*  CREATININE 1.59* 1.59*   No results found for this basename: TROPONINI:2,CK,MB:2 in the last 72 hours Hepatic Function Panel  Basename 12/31/11 1217  PROT 6.6  ALBUMIN 3.4*  AST 23  ALT 18  ALKPHOS 80  BILITOT 0.3  BILIDIR --  IBILI --   No results found for this basename: CHOL in the last 72 hours  Basename 12/31/11 1217  INR 1.08    Imaging: Imaging results have been reviewed  Cardiac Studies:  Assessment/Plan:   Principal Problem:  *Acute on chronic diastolic heart failure Active Problems:  PVD, widespread, recent OP dopplers suggest ISR bilat Iliac stents.  S/P CABG x 3 '97, PCI '09, cath 4/11- medical Rx  Acute on chronic renal insufficiency, baseline Scr 1.6  DM  OBSTRUCTIVE SLEEP APNEA  GI BLEED, 12/12- gastric AVM ablated  HTN (hypertension)  HLD  (hyperlipidemia)  Obesity  Plan- Dr Herbie Baltimore to see. Ace to start back this am.  Corine Shelter PA-C 01/02/2012, 11:21 AM  I have seen & examined the patient this afternoon after Corine Shelter, [place]. I agree with his findings & exam as noted above. He is definitely feeling better. No further CP & breathing improved.  Is ambulating in the halls without difficulty.  Prelim look @ Echo from yesterday - normal systolic function with & pseudonormal (grade II) diastolic dysfunction. Switch to PO Lasix today  ACE-I not given yesterday due to elevated K+. To start today.  BP still too high.  Not on BB due to bradycardia, but if rates continue to pick-up, may consider Bystolic as OP (less HR effect). D/c K+ replacement  Did not mention constipation -- will need bowel regimen  On Statin for HLD  Lantus + mealtime NovoLog insulin for DM. On nighttime CPAP - slept well last PM PPI for GI prophylaxis.  Is ambulating - but may want to consider SQ Enoxaparin for DVT prophylaxis.  Marykay Lex, M.D., M.S. [facility] [address] [place], [place]  [address]  [phone] Pager # [phone]  01/02/2012 11:47 AM

## 2012-01-02 NOTE — Progress Notes (Signed)
Hypoglycemic Event  CBG: 63  Treatment: 15 GM carbohydrate snack  Symptoms: None  Follow-up CBG: Time:1145 CBG Result:66  Possible Reasons for Event: Unknown  Comments/MD notified:Harding    [name], Brantley Stage  Remember to initiate Hypoglycemia Order Set & complete

## 2012-01-02 NOTE — Progress Notes (Addendum)
ANTICOAGULATION CONSULT NOTE - Initial Consult  Pharmacy Consult for Lovenox Indication: VTE prophylaxis  Allergies  Allergen Reactions  . Donepezil Diarrhea  . Heparin Itching and Rash  . Hydralazine Other (See Comments)    DO NOT TAKE PER MD  . Hydromorphone Itching  . Morphine Itching  . Plavix (Clopidogrel Bisulfate) Other (See Comments)    GI BLEEDING; "so bad I had to take blood; dr took me off it"  . Promethazine Other (See Comments)    DO NOT TAKE PER MD  . Sulfonamide Derivatives Itching  . Zaroxolyn (Metolazone) Diarrhea  . Cefuroxime Other (See Comments)    REACTION: unkown reaction  . Metoprolol Other (See Comments)    unknown  . Ertapenem Itching and Rash    12/31/2011 pt does not recall allergy or severity    Patient Measurements: Height: 5\' 8"  (172.7 cm) Weight: 245 lb 6 oz (111.3 kg) (scale b) IBW/kg (Calculated) : 68.4    Vital Signs: Temp: 98.1 F (36.7 C) (08/10 1010) Temp src: Oral (08/10 1010) BP: 163/76 mmHg (08/10 1010) Pulse Rate: 78  (08/10 1010)  Labs:  Basename 01/02/12 0550 01/01/12 0638 12/31/11 1217  HGB 10.2* -- 9.7*  HCT 32.2* -- 30.8*  PLT 201 -- 175  APTT -- -- --  LABPROT -- -- 14.2  INR -- -- 1.08  HEPARINUNFRC -- -- --  CREATININE 1.59* 1.59* 1.60*  CKTOTAL -- -- --  CKMB -- -- --  TROPONINI -- -- --    Estimated Creatinine Clearance: 47.9 ml/min (by C-G formula based on Cr of 1.59).   Medical History: Past Medical History  Diagnosis Date  . Adenomatous colon polyp   . Diverticulosis   . GERD (gastroesophageal reflux disease)   . Hiatal hernia   . Alzheimer disease   . Gout     "hands; backbone; elbows; shoulders"  . Asthma with bronchitis   . COPD (chronic obstructive pulmonary disease)   . Chronic hypoxemic respiratory failure   . OSA (obstructive sleep apnea)   . CHF (congestive heart failure)   . Blood transfusion     "I've had about 6 pints" (12/31/2011)  . Pneumonia   . Anemia   . GI bleed   .  Coronary artery disease   . Hypertension   . Peripheral vascular disease     PAD  . Dysrhythmia     "skips"  . Anginal pain   . Myocardial infarction     "they said I had 2 light ones"  . Shortness of breath 12/31/2011    "all the time"  . Type II diabetes mellitus   . Arthritis     "plenty"  . Chronic lower back pain   . Renal insufficiency     "went on dialysis probably 3 times"  . Skin cancer     "forehead; arms"  . Depression     Medications:  Prescriptions prior to admission  Medication Sig Dispense Refill  . acetaminophen (TYLENOL) 650 MG CR tablet Take 1,300 mg by mouth 2 (two) times daily.       Marland Kitchen albuterol (PROAIR HFA) 108 (90 BASE) MCG/ACT inhaler Inhale 2 puffs into the lungs every 4 (four) hours as needed for wheezing or shortness of breath.  3 Inhaler  3  . albuterol (PROVENTIL) (2.5 MG/3ML) 0.083% nebulizer solution Take 2.5 mg by nebulization every 6 (six) hours as needed. For shortness of breath      . allopurinol (ZYLOPRIM) 300 MG tablet Take 1 tablet by  mouth Twice daily.      Marland Kitchen amLODipine (NORVASC) 10 MG tablet Take 1 tablet (10 mg total) by mouth daily.  30 tablet  5  . aspirin 325 MG tablet Take 325 mg by mouth daily.        Marland Kitchen atorvastatin (LIPITOR) 20 MG tablet Take 1 tablet by mouth daily.      . bumetanide (BUMEX) 2 MG tablet Take 1.5 tablets by mouth Twice daily.       . cloNIDine HCl (KAPVAY) 0.1 MG TB12 ER tablet Take 1 tablet (0.1 mg total) by mouth 2 (two) times daily.  60 tablet  5  . colchicine 0.6 MG tablet Take 0.6 mg by mouth daily.       . diclofenac (VOLTAREN) 0.1 % ophthalmic solution Place 1 drop into both eyes 2 (two) times daily.        Marland Kitchen escitalopram (LEXAPRO) 10 MG tablet Take 20 mg by mouth daily.       Marland Kitchen ezetimibe (ZETIA) 10 MG tablet Take 10 mg by mouth daily.        . folic acid (FOLVITE) 400 MCG tablet Take 400 mcg by mouth daily.        Marland Kitchen gabapentin (NEURONTIN) 100 MG tablet Take 100 mg by mouth at bedtime.        . insulin  glargine (LANTUS) 100 UNIT/ML injection Inject 53-56 Units into the skin 2 (two) times daily. 53 units in the morning and 56 units in the evening      . isosorbide mononitrate (IMDUR) 30 MG 24 hr tablet Take 60 mg by mouth daily.       . Multiple Vitamin (MULTIVITAMIN) tablet Take 0.5 tablets by mouth 2 (two) times daily.       . nitroGLYCERIN (NITROSTAT) 0.4 MG SL tablet Place 0.4 mg under the tongue every 5 (five) minutes as needed. May repeat x 3 for chest pain      . NOVOLOG FLEXPEN 100 UNIT/ML injection Inject 10-20 Units into the skin 3 (three) times daily. 10 units in the morning,20 units at noon, and 20 in the evening before meals      . pantoprazole (PROTONIX) 40 MG tablet Take 40 mg by mouth daily.        . predniSONE (DELTASONE) 5 MG tablet Take 5 mg by mouth daily.        Marland Kitchen tiotropium (SPIRIVA) 18 MCG inhalation capsule Place 18 mcg into inhaler and inhale daily.          Assessment: [name] is a 78 year old man admitted for CHF exacerbation to start on Lovenox for VTE prophylaxis.  Patient weighs 111kg.  His creatinine clearance is calculated to be 48 mL/min.  Goal of Therapy:  VTE prevention  Plan:  Lovenox 55mg  sq Daily (0.5mg /kg/day) CBC every 3 days.  Mickeal Skinner 01/02/2012,1:25 PM  Addendum:  Patient reports heparin allergy.  Paged Dr. Herbie Baltimore who cancelled Lovenox order.  Celedonio Miyamoto, PharmD, BCPS Clinical Pharmacist Pager [phone]

## 2012-01-02 NOTE — Progress Notes (Signed)
Bs 63 at 1111 gave one OJ BS at 66 at 1145 gave on apple J bs 95 at 1225

## 2012-01-03 DIAGNOSIS — I519 Heart disease, unspecified: Secondary | ICD-10-CM | POA: Diagnosis present

## 2012-01-03 LAB — BASIC METABOLIC PANEL
Calcium: 8.9 mg/dL (ref 8.4–10.5)
Creatinine, Ser: 1.68 mg/dL — ABNORMAL HIGH (ref 0.50–1.35)
GFR calc non Af Amer: 38 mL/min — ABNORMAL LOW (ref >90)
Glucose, Bld: 111 mg/dL — ABNORMAL HIGH (ref 70–99)
Sodium: 139 mEq/L (ref 135–145)

## 2012-01-03 LAB — GLUCOSE, CAPILLARY: Glucose-Capillary: 123 mg/dL — ABNORMAL HIGH (ref 70–99)

## 2012-01-03 MED ORDER — LISINOPRIL 5 MG PO TABS: 5.0000 mg | ORAL_TABLET | Freq: Every day | ORAL | Status: DC

## 2012-01-03 MED ORDER — BUMETANIDE 2 MG PO TABS: 2.0000 mg | ORAL_TABLET | Freq: Two times a day (BID) | ORAL | Status: DC

## 2012-01-03 NOTE — Discharge Summary (Signed)
Patient ID: [name],  MRN: [id number], DOB/AGE: Dec 18, 1933 78 y.o.  Admit date: 12/31/2011 Discharge date: 01/03/2012  Primary Care Provider:  Primary Cardiologist: Dr Allyson Sabal  Discharge Diagnoses  Principal Problem:  *Acute on chronic diastolic heart failure, discharge wgt 109.8kg  Active Problems:  PVD, widespread, ISR bilat Iliac stents, re stented 12/11/11  S/P CABG x 3 '97, PCI '09, cath 4/11- medical Rx  Acute on chronic renal insufficiency, baseline Scr 1.6  Diastolic dysfunction, - grade 2 by echo 01/01/12  DM, type 2 IDDM  OSA- on c-pap  GI BLEED, 12/12- gastric AVM ablated  HTN (hypertension)  HLD (hyperlipidemia)  Obesity  Chronic renal insufficiency, stage III (moderate) - Baseline creatinine roughly 1.6- discharge SCr 1.68    Procedures: 2D echo   Hospital Course: 78 y/o with a history of CAD, S/P CABG '97, cath 4/11- medical Rx. He has widespread PVD as described. Recently an OP doppler showed ISR of previously placed iliac stents. He had re stenting on 12/11/11. His ACE was held post procedure secondary to concern over worsening of his chronic renal insufficiency. The pt did well till 12/31/11 when he was admitted with increasing SOB. He admits to being non compliant with low sodium diet (he had some pizza). His admission weight was 111kg. He was admitted to telemetry and started on IV lasix. Echocardiogram showed good LVF with grade 2 diastolic dysfunction. At discharge his SCr is 1.68. His weight is 109kg. Dr Herbie Baltimore has resumed his ACE. He will follow up in one week as an OP. He'll need a follow up BMP. His K= was 5.6 on admission and for now will hold off on K+ supplement.   Discharge Vitals:  Blood pressure 163/55, pulse 64, temperature 98.1 F (36.7 C), temperature source Oral, resp. rate 18, height 5\' 8"  (1.727 m), weight 109.861 kg (242 lb 3.2 oz), SpO2 90.00%.  Physical Exam:  General appearance: alert, cooperative, no distress and morbidly obese; pleasant  mood and affect  Lungs: clear to auscultation bilaterally, but poor overall air movement due to COPD  Heart: regular rhythm and 2/6 systolic murmur  ABD: protuberant, mild midline diastasis recti, NABS, Non-tender.  Extr: no C/C/E  Labs: Results for orders placed during the hospital encounter of 12/31/11 (from the past 48 hour(s))  GLUCOSE, CAPILLARY     Status: Abnormal   Collection Time   01/01/12  4:15 PM      Component Value Range Comment   Glucose-Capillary 145 (*) 70 - 99 mg/dL    Comment 1 Notify RN      Comment 2 Documented in Chart     GLUCOSE, CAPILLARY     Status: Abnormal   Collection Time   01/01/12  9:16 PM      Component Value Range Comment   Glucose-Capillary 170 (*) 70 - 99 mg/dL   BASIC METABOLIC PANEL     Status: Abnormal   Collection Time   01/02/12  5:50 AM      Component Value Range Comment   Sodium 144  135 - 145 mEq/L    Potassium 3.9  3.5 - 5.1 mEq/L    Chloride 103  96 - 112 mEq/L    CO2 30  19 - 32 mEq/L    Glucose, Bld 77  70 - 99 mg/dL    BUN 44 (*) 6 - 23 mg/dL    Creatinine, Ser 4.13 (*) 0.50 - 1.35 mg/dL    Calcium 9.4  8.4 - 24.4 mg/dL  GFR calc non Af Amer 41 (*) >90 mL/min    GFR calc Af Amer 47 (*) >90 mL/min   CBC     Status: Abnormal   Collection Time   01/02/12  5:50 AM      Component Value Range Comment   WBC 11.5 (*) 4.0 - 10.5 K/uL    RBC 3.35 (*) 4.22 - 5.81 MIL/uL    Hemoglobin 10.2 (*) 13.0 - 17.0 g/dL    HCT 16.1 (*) 09.6 - 52.0 %    MCV 96.1  78.0 - 100.0 fL    MCH 30.4  26.0 - 34.0 pg    MCHC 31.7  30.0 - 36.0 g/dL    RDW 04.5 (*) 40.9 - 15.5 %    Platelets 201  150 - 400 K/uL   PRO B NATRIURETIC PEPTIDE     Status: Abnormal   Collection Time   01/02/12  5:50 AM      Component Value Range Comment   Pro B Natriuretic peptide (BNP) 1007.0 (*) 0 - 450 pg/mL   GLUCOSE, CAPILLARY     Status: Abnormal   Collection Time   01/02/12  6:39 AM      Component Value Range Comment   Glucose-Capillary 67 (*) 70 - 99 mg/dL   GLUCOSE,  CAPILLARY     Status: Abnormal   Collection Time   01/02/12  7:28 AM      Component Value Range Comment   Glucose-Capillary 155 (*) 70 - 99 mg/dL   GLUCOSE, CAPILLARY     Status: Abnormal   Collection Time   01/02/12 11:11 AM      Component Value Range Comment   Glucose-Capillary 63 (*) 70 - 99 mg/dL    Comment 1 Documented in Chart      Comment 2 Notify RN     PRO B NATRIURETIC PEPTIDE     Status: Abnormal   Collection Time   01/02/12 11:30 AM      Component Value Range Comment   Pro B Natriuretic peptide (BNP) 945.2 (*) 0 - 450 pg/mL   GLUCOSE, CAPILLARY     Status: Abnormal   Collection Time   01/02/12 11:45 AM      Component Value Range Comment   Glucose-Capillary 66 (*) 70 - 99 mg/dL    Comment 1 Documented in Chart      Comment 2 Notify RN     GLUCOSE, CAPILLARY     Status: Normal   Collection Time   01/02/12 12:23 PM      Component Value Range Comment   Glucose-Capillary 95  70 - 99 mg/dL   GLUCOSE, CAPILLARY     Status: Abnormal   Collection Time   01/02/12  4:26 PM      Component Value Range Comment   Glucose-Capillary 110 (*) 70 - 99 mg/dL    Comment 1 Documented in Chart      Comment 2 Notify RN     GLUCOSE, CAPILLARY     Status: Normal   Collection Time   01/02/12  9:22 PM      Component Value Range Comment   Glucose-Capillary 98  70 - 99 mg/dL   GLUCOSE, CAPILLARY     Status: Abnormal   Collection Time   01/03/12  3:08 AM      Component Value Range Comment   Glucose-Capillary 123 (*) 70 - 99 mg/dL   BASIC METABOLIC PANEL     Status: Abnormal   Collection Time  01/03/12  5:05 AM      Component Value Range Comment   Sodium 139  135 - 145 mEq/L    Potassium 3.8  3.5 - 5.1 mEq/L    Chloride 100  96 - 112 mEq/L    CO2 29  19 - 32 mEq/L    Glucose, Bld 111 (*) 70 - 99 mg/dL    BUN 40 (*) 6 - 23 mg/dL    Creatinine, Ser 7.82 (*) 0.50 - 1.35 mg/dL    Calcium 8.9  8.4 - 95.6 mg/dL    GFR calc non Af Amer 38 (*) >90 mL/min    GFR calc Af Amer 44 (*) >90 mL/min     GLUCOSE, CAPILLARY     Status: Abnormal   Collection Time   01/03/12  6:30 AM      Component Value Range Comment   Glucose-Capillary 181 (*) 70 - 99 mg/dL     Disposition:  Follow-up Information    Follow up with Abelino Derrick, PA. (office will call you)    Contact information:   [address] [place] [address] [phone]          Discharge Medications:  Medication List  As of 01/03/2012 11:20 AM   TAKE these medications         acetaminophen 650 MG CR tablet   Commonly known as: TYLENOL   Take 1,300 mg by mouth 2 (two) times daily.      albuterol (2.5 MG/3ML) 0.083% nebulizer solution   Commonly known as: PROVENTIL   Take 2.5 mg by nebulization every 6 (six) hours as needed. For shortness of breath      albuterol 108 (90 BASE) MCG/ACT inhaler   Commonly known as: PROVENTIL HFA;VENTOLIN HFA   Inhale 2 puffs into the lungs every 4 (four) hours as needed for wheezing or shortness of breath.      allopurinol 300 MG tablet   Commonly known as: ZYLOPRIM   Take 1 tablet by mouth Twice daily.      amLODipine 10 MG tablet   Commonly known as: NORVASC   Take 1 tablet (10 mg total) by mouth daily.      aspirin 325 MG tablet   Take 325 mg by mouth daily.      atorvastatin 20 MG tablet   Commonly known as: LIPITOR   Take 1 tablet by mouth daily.      bumetanide 2 MG tablet   Commonly known as: BUMEX   Take 1 tablet (2 mg total) by mouth 2 (two) times daily.      cloNIDine HCl 0.1 MG Tb12 ER tablet   Commonly known as: KAPVAY   Take 1 tablet (0.1 mg total) by mouth 2 (two) times daily.      colchicine 0.6 MG tablet   Take 0.6 mg by mouth daily.      diclofenac 0.1 % ophthalmic solution   Commonly known as: VOLTAREN   Place 1 drop into both eyes 2 (two) times daily.      escitalopram 10 MG tablet   Commonly known as: LEXAPRO   Take 20 mg by mouth daily.      ezetimibe 10 MG tablet   Commonly known as: ZETIA   Take 10 mg by mouth  daily.      folic acid 400 MCG tablet   Commonly known as: FOLVITE   Take 400 mcg by mouth daily.      gabapentin 100 MG tablet  Commonly known as: NEURONTIN   Take 100 mg by mouth at bedtime.      isosorbide mononitrate 30 MG 24 hr tablet   Commonly known as: IMDUR   Take 60 mg by mouth daily.      LANTUS 100 UNIT/ML injection   Generic drug: insulin glargine   Inject 53-56 Units into the skin 2 (two) times daily. 53 units in the morning and 56 units in the evening      lisinopril 5 MG tablet   Commonly known as: PRINIVIL,ZESTRIL   Take 1 tablet (5 mg total) by mouth daily.      multivitamin tablet   Take 0.5 tablets by mouth 2 (two) times daily.      nitroGLYCERIN 0.4 MG SL tablet   Commonly known as: NITROSTAT   Place 0.4 mg under the tongue every 5 (five) minutes as needed. May repeat x 3 for chest pain      NOVOLOG FLEXPEN 100 UNIT/ML injection   Generic drug: insulin aspart   Inject 10-20 Units into the skin 3 (three) times daily. 10 units in the morning,20 units at noon, and 20 in the evening before meals      pantoprazole 40 MG tablet   Commonly known as: PROTONIX   Take 40 mg by mouth daily.      predniSONE 5 MG tablet   Commonly known as: DELTASONE   Take 5 mg by mouth daily.      tiotropium 18 MCG inhalation capsule   Commonly known as: SPIRIVA   Place 18 mcg into inhaler and inhale daily.           Duration of Discharge Encounter: Greater than 30 minutes including physician time.  Jolene Provost PA-C 01/03/2012 11:20 AM  I personally saw & examined Mr. Keasling this AM.  I agree with Mr. Wynelle Link summary.  Admitted for Acute on Chronic CHF exacerbation - confirmed by echo.    He is definitely feeling better. No further CP & breathing improved. Is ambulating in the halls without difficulty.  Did well after switching to PO Lasix today  BP still not medically controlled but back on ACE inhibitor may be of a drip at 10 mg post discharge in the  outpatient setting.  Can probably be to start K+ replacement as baseline potassium 3.8.  On Statin for HLD  Lantus + mealtime NovoLog insulin for DM. 2 referral to the well controlled  On nighttime CPAP - slept well last PM  Overall is symptomatically improved doing well on oral medications anticipate discharge later today this had been provided he is able to walk around without oxygen de-saturation or other unforeseen problem.   Marykay Lex, M.D., M.S. [facility] [address] [place], [place]  [address]  [phone] Pager # [phone]  01/03/2012 12:57 PM

## 2012-01-03 NOTE — Progress Notes (Signed)
PT d/c to home. DC instructions and medication reviewed with PT and daughter. Both state understanding. All questions answered

## 2012-01-03 NOTE — Progress Notes (Signed)
78 year old obese Caucasian gentleman with history of coronary disease with CABG in 1997 (LIMA-LAD, SVG-OM-OM), hypertension, COPD, OSA on CPAP, hyperlipidemia and diabetes of PAD status post bilateral iliac artery stenting as well as left renal artery stenting in 2010. As well right subclavian artery stenosis. He was recently discharged status post repeat performance for intervention, he was admitted on 74 with signs and is consistent with acute on chronic diastolic heart failure after increased salt intake with his diet and having been off his ACE inhibitor due to recent renal insufficiency the time of his peripheral vascular procedure.   Subjective:  He exhibited no longer short of breath and in doing well any problems. Objective:  Vital Signs in the last 24 hours: Temp:  [96.3 F (35.7 C)-98.1 F (36.7 C)] 98.1 F (36.7 C) (08/11 0923) Pulse Rate:  [64-78] 64  (08/11 0923) Resp:  [18-20] 18  (08/11 0923) BP: (127-163)/(49-79) 163/55 mmHg (08/11 0923) SpO2:  [90 %-97 %] 90 % (08/11 0923) Weight:  [109.861 kg (242 lb 3.2 oz)] 109.861 kg (242 lb 3.2 oz) (08/11 0526)  Intake/Output from previous day:  Intake/Output Summary (Last 24 hours) at 01/03/12 1004 Last data filed at 01/03/12 4098  Gross per 24 hour  Intake   1290 ml  Output   2550 ml  Net  -1260 ml    Physical Exam: General appearance: alert, cooperative, no distress and morbidly obese; pleasant mood and affect Lungs: clear to auscultation bilaterally, but poor overall air movement due to COPD Heart: regular rhythm and 2/6 systolic murmur ABD: protuberant, mild midline diastasis recti,  NABS, Non-tender. Extr: no C/C/E   Rate:70s, NSR  Lab Results:  Basename 01/02/12 0550 12/31/11 1217  WBC 11.5* 10.9*  HGB 10.2* 9.7*  PLT 201 175    Basename 01/03/12 0505 01/02/12 0550  NA 139 144  K 3.8 3.9  CL 100 103  CO2 29 30  GLUCOSE 111* 77  BUN 40* 44*  CREATININE 1.68* 1.59*   No results found for this basename:  TROPONINI:2,CK,MB:2 in the last 72 hours Hepatic Function Panel  Basename 12/31/11 1217  PROT 6.6  ALBUMIN 3.4*  AST 23  ALT 18  ALKPHOS 80  BILITOT 0.3  BILIDIR --  IBILI --   No results found for this basename: CHOL in the last 72 hours  Basename 12/31/11 1217  INR 1.08    Imaging: Imaging results have been reviewed  Echo 8/9: Left ventricle: The cavity size was normal. Wall thickness was (upper) normal. Systolic function was vigorous. The estimated ejection fraction was in the range of 65% to 70%. Wall motion was normal; there were no regional wall motion abnormalities. Features are consistent with a pseudonormal left ventricular filling pattern, with concomitant abnormal relaxation and increased filling pressure (grade 2 diastolic dysfunction). Doppler parameters are consistent with elevated mean left atrial filling pressure. - Ventricular septum: Septal motion showed paradox.  - Mitral valve: Calcified annulus. Mildly thickened leaflets.  - Left atrium: The atrium was mildly dilated.  Length of Stay: 3  Assessment/Plan:   Principal Problem:  *Acute on chronic diastolic heart failure Active Problems:  HTN (hypertension)  Diastolic dysfunction, left ventricle - grade 2 (pseudo-normal)  OBSTRUCTIVE SLEEP APNEA  PVD, widespread, recent OP dopplers suggest ISR bilat Iliac stents.  S/P CABG x 3 '97, PCI '09, cath 4/11- medical Rx  Acute on chronic renal insufficiency, baseline Scr 1.6  Obesity  Chronic renal insufficiency, stage III (moderate) - Baseline creatinine roughly 1.6  HLD (hyperlipidemia)  DM   He is definitely feeling better. No further CP & breathing improved.  Is ambulating in the halls without difficulty.  Did well after switching to PO Lasix today  BP still not medically controlled but back on ACE inhibitor may be of a drip at 10 mg post discharge in the outpatient setting. As are no longer on IV Lasix can probably be to start K+ replacement as baseline  potassium 3.8. Did not mention constipation  On Statin for HLD  Lantus + mealtime NovoLog insulin for DM. 2 referral to the well controlled On nighttime CPAP - slept well last PM PPI for GI prophylaxis.   Overall is symptomatically improved doing well on oral medications anticipate discharge later today this had been provided he is able to walk around without oxygen de-saturation or other unforeseen problem.    Marykay Lex, M.D., M.S. [facility] [address] [place], [place]  [address]  [phone] Pager # [phone]  01/03/2012 10:04 AM

## 2012-01-04 LAB — GLUCOSE, CAPILLARY

## 2012-01-05 NOTE — ED Provider Notes (Signed)
I saw and evaluated the patient, reviewed the resident's note and I agree with the findings and plan.  Toy Baker, MD 01/05/12 [phone]

## 2012-01-07 ENCOUNTER — Encounter: Payer: Self-pay | Admitting: Internal Medicine

## 2012-01-07 ENCOUNTER — Encounter: Payer: Self-pay | Admitting: Pulmonary Disease

## 2012-01-07 ENCOUNTER — Ambulatory Visit (INDEPENDENT_AMBULATORY_CARE_PROVIDER_SITE_OTHER): Payer: BC Managed Care – PPO | Admitting: Internal Medicine

## 2012-01-07 VITALS — BP 136/50 | HR 58 | Ht 69.0 in | Wt 249.4 lb

## 2012-01-07 DIAGNOSIS — I519 Heart disease, unspecified: Secondary | ICD-10-CM

## 2012-01-07 DIAGNOSIS — G4733 Obstructive sleep apnea (adult) (pediatric): Secondary | ICD-10-CM

## 2012-01-07 NOTE — Progress Notes (Signed)
Subjective:    Patient ID: [name], male    DOB: 1933/10/08, 78 y.o.   MRN: [id number]  HPI 10/06/10- 84 yoM former smoker followed for sleep apnea, bronchitis, complicated by CAD with hx acute and chronic respiratory failure, diverticular bleed with anemia. Gout. Wife here. Last here July 07, 2010 . A CT had suggested MAIC with bronchitis. He had mentioned palpitations last time, but no recent concern.  Gout has flared. On maintenance prednisone 5 mg/day for gout. Occasionally some mild chest congestion but Dulera 200 has controlled him. He would like to have a rescue inhaler.  He continues to use CPAP 13/ SMS all night every night, with O2 for sleep at 3L/M.   04/09/11- 10/06/10- 77 yoM former smoker followed for sleep apnea, bronchitis, complicated by CAD with hx acute and chronic respiratory failure, diverticular bleed with anemia. Gout. Wife here. They describe increased cough, wheeze, shortness of breath for several months with little change from day to day or with weather. He has portable oxygen which he doesn't use, just adding oxygen to his CPAP at night. Cough productive of some light yellow sputum with no blood no chest pain. He did have a cold a few weeks ago at his primary physician and given antibiotic. Using his rescue inhaler 2 or 3 times per week. Nebulizer has been a big help, used at least once daily. He is taking prednisone 5 mg daily for gout. He like to Spiriva but it cost too much. Continues CPAP 13+ oxygen with good compliance and control. Chest x-ray: 11/20/2010-CE, hyperinflation, no active process. A ventilation perfusion lung scan on 11/20/2010 showed air trapping with no PE.  10/08/11-  77 yoM former smoker followed for sleep apnea, bronchitis, complicated by CAD with hx acute and chronic respiratory failure, diverticular bleed with anemia. Gout. ? Bronchitis flare up; increased cough-slight productive yellow in color, SOB and wheezing. Chronic cough with  little phlegm with scant yellow no blood. Still on Spiriva but complains it is expensive. Remains on maintenance prednisone 5 mg daily for gout diabetes prevents increasing maintenance prednisone and medication cost is a problem for alternatives. Uses CPAP every night with O2 approx 8-9 hours  01/07/12- 77 yoM former smoker followed for sleep apnea, bronchitis, complicated by CAD/ CABG/ PAD/ Gr 2 diastolic dysf/ CHF with hx acute and chronic respiratory failure, diverticular bleed with anemia. Gout. Chronic renal insufficiency.  Wife here.  Patient states was in the hospital 8/8-8/11 for sob/ CHF. Iliac re-stenting in July.  Patient states doing better now. c/o wheezing and cough with yellow mucus.  Denies sob, chest pain ,chest tightness. Continues prednisone 5 mg daily. CXR 01/03/12 IMPRESSION:  Stable cardiomegaly, mild pulmonary vascular congestion and chronic  interstitial lung disease. No acute abnormality.  Original Report Authenticated By: Darrol Angel, M.D.   Review of Systems-see HPI Constitutional:   No-   weight loss, night sweats, fevers, chills, fatigue, lassitude. HEENT:   No-  headaches, difficulty swallowing, tooth/dental problems, sore throat,       No-  sneezing, itching, ear ache, nasal congestion, post nasal drip,  CV:  No-   chest pain, orthopnea, PND, swelling in lower extremities, anasarca,  dizziness, palpitations Resp: +   shortness of breath with exertion or at rest.              +   productive cough,  No non-productive cough,  No- coughing up of blood.            +  change in color of mucus.  No- wheezing.   Skin: No-   rash or lesions. GI:  No-   heartburn, indigestion, abdominal pain, nausea, vomiting, e GU:  MS:  No-   joint pain or swelling.   Neuro-     nothing unusual Psych:  No- change in mood or affect. No depression or anxiety.  No memory loss.   Objective:   Physical Exam BP 136/50  Pulse 58  Ht 5\' 9"  (1.753 m)  Wt 249 lb 6.4 oz (113.127 kg)  BMI  36.83 kg/m2  SpO2 97% General- Alert, Oriented, Affect-appropriate, Distress- none acute; obese Skin- rash-none, lesions- none, excoriation- none Lymphadenopathy- none Head- atraumatic            Eyes- Gross vision intact, PERRLA, conjunctivae clear secretions            Ears- Hearing, canals-normal            Nose- Clear, no-Septal dev, mucus, polyps, erosion, perforation             Throat- Mallampati II , mucosa clear , drainage- none, tonsils- atrophic Neck- flexible , trachea midline, no stridor , thyroid nl, carotid no bruit Chest - symmetrical excursion , unlabored           Heart/CV- RRR , no murmur , no gallop  , no rub, nl s1 s2                           - JVD+ 1 cm , edema+ trace, stasis changes- none, varices- none           Lung-  clear, unlabored, wheeze- none, cough+ with deep breath , dullness-none, rub- none           Chest wall-  Abd-  Br/ Gen/ Rectal- Not done, not indicated Extrem- cyanosis- none, clubbing, none, atrophy- none, strength- nl Neuro- grossly intact to observation

## 2012-01-07 NOTE — Patient Instructions (Addendum)
Continue present treatment  Please call as needed 

## 2012-01-14 NOTE — Assessment & Plan Note (Signed)
Continues good control and compliance with CPAP 13, O2 2 L for sleep/[employer]

## 2012-01-14 NOTE — Assessment & Plan Note (Signed)
Better control since hospitalized mid August 2013. Potential for fluid overload contribute to dyspnea.

## 2012-03-03 ENCOUNTER — Encounter (HOSPITAL_COMMUNITY): Payer: Self-pay | Admitting: General Practice

## 2012-03-03 ENCOUNTER — Inpatient Hospital Stay (HOSPITAL_COMMUNITY): Payer: BC Managed Care – PPO

## 2012-03-03 ENCOUNTER — Inpatient Hospital Stay (HOSPITAL_COMMUNITY)
Admission: AD | Admit: 2012-03-03 | Discharge: 2012-03-06 | DRG: 552 | Disposition: A | Discharge status: Home / Self Care | Payer: BC Managed Care – PPO | Source: Ambulatory Visit | Attending: Internal Medicine | Admitting: Internal Medicine

## 2012-03-03 ENCOUNTER — Telehealth: Payer: Self-pay

## 2012-03-03 DIAGNOSIS — J209 Acute bronchitis, unspecified: Secondary | ICD-10-CM

## 2012-03-03 DIAGNOSIS — I509 Heart failure, unspecified: Secondary | ICD-10-CM | POA: Diagnosis present

## 2012-03-03 DIAGNOSIS — F3289 Other specified depressive episodes: Secondary | ICD-10-CM | POA: Diagnosis present

## 2012-03-03 DIAGNOSIS — Z9861 Coronary angioplasty status: Secondary | ICD-10-CM

## 2012-03-03 DIAGNOSIS — I519 Heart disease, unspecified: Secondary | ICD-10-CM | POA: Diagnosis present

## 2012-03-03 DIAGNOSIS — M25569 Pain in unspecified knee: Secondary | ICD-10-CM | POA: Diagnosis present

## 2012-03-03 DIAGNOSIS — Y92009 Unspecified place in unspecified non-institutional (private) residence as the place of occurrence of the external cause: Secondary | ICD-10-CM

## 2012-03-03 DIAGNOSIS — J4489 Other specified chronic obstructive pulmonary disease: Secondary | ICD-10-CM | POA: Diagnosis present

## 2012-03-03 DIAGNOSIS — J449 Chronic obstructive pulmonary disease, unspecified: Secondary | ICD-10-CM | POA: Diagnosis present

## 2012-03-03 DIAGNOSIS — M545 Low back pain, unspecified: Secondary | ICD-10-CM | POA: Diagnosis present

## 2012-03-03 DIAGNOSIS — I2510 Atherosclerotic heart disease of native coronary artery without angina pectoris: Secondary | ICD-10-CM | POA: Diagnosis present

## 2012-03-03 DIAGNOSIS — I5033 Acute on chronic diastolic (congestive) heart failure: Secondary | ICD-10-CM

## 2012-03-03 DIAGNOSIS — Z79899 Other long term (current) drug therapy: Secondary | ICD-10-CM

## 2012-03-03 DIAGNOSIS — I10 Essential (primary) hypertension: Secondary | ICD-10-CM | POA: Diagnosis present

## 2012-03-03 DIAGNOSIS — Z888 Allergy status to other drugs, medicaments and biological substances status: Secondary | ICD-10-CM

## 2012-03-03 DIAGNOSIS — N170 Acute kidney failure with tubular necrosis: Secondary | ICD-10-CM | POA: Diagnosis present

## 2012-03-03 DIAGNOSIS — K922 Gastrointestinal hemorrhage, unspecified: Secondary | ICD-10-CM | POA: Diagnosis present

## 2012-03-03 DIAGNOSIS — K921 Melena: Secondary | ICD-10-CM | POA: Diagnosis present

## 2012-03-03 DIAGNOSIS — Z860100 Personal history of colon polyps, unspecified: Secondary | ICD-10-CM

## 2012-03-03 DIAGNOSIS — T504X5A Adverse effect of drugs affecting uric acid metabolism, initial encounter: Secondary | ICD-10-CM | POA: Diagnosis present

## 2012-03-03 DIAGNOSIS — Z85828 Personal history of other malignant neoplasm of skin: Secondary | ICD-10-CM

## 2012-03-03 DIAGNOSIS — Z882 Allergy status to sulfonamides status: Secondary | ICD-10-CM

## 2012-03-03 DIAGNOSIS — E669 Obesity, unspecified: Secondary | ICD-10-CM | POA: Diagnosis present

## 2012-03-03 DIAGNOSIS — Z7982 Long term (current) use of aspirin: Secondary | ICD-10-CM

## 2012-03-03 DIAGNOSIS — N2889 Other specified disorders of kidney and ureter: Secondary | ICD-10-CM | POA: Diagnosis present

## 2012-03-03 DIAGNOSIS — Z9089 Acquired absence of other organs: Secondary | ICD-10-CM

## 2012-03-03 DIAGNOSIS — I252 Old myocardial infarction: Secondary | ICD-10-CM

## 2012-03-03 DIAGNOSIS — Z22322 Carrier or suspected carrier of Methicillin resistant Staphylococcus aureus: Secondary | ICD-10-CM

## 2012-03-03 DIAGNOSIS — E785 Hyperlipidemia, unspecified: Secondary | ICD-10-CM

## 2012-03-03 DIAGNOSIS — G309 Alzheimer's disease, unspecified: Secondary | ICD-10-CM | POA: Diagnosis present

## 2012-03-03 DIAGNOSIS — J9610 Chronic respiratory failure, unspecified whether with hypoxia or hypercapnia: Secondary | ICD-10-CM | POA: Diagnosis present

## 2012-03-03 DIAGNOSIS — I739 Peripheral vascular disease, unspecified: Secondary | ICD-10-CM | POA: Diagnosis present

## 2012-03-03 DIAGNOSIS — Z885 Allergy status to narcotic agent status: Secondary | ICD-10-CM

## 2012-03-03 DIAGNOSIS — T465X5A Adverse effect of other antihypertensive drugs, initial encounter: Secondary | ICD-10-CM | POA: Diagnosis present

## 2012-03-03 DIAGNOSIS — IMO0002 Reserved for concepts with insufficient information to code with codable children: Secondary | ICD-10-CM

## 2012-03-03 DIAGNOSIS — Z87891 Personal history of nicotine dependence: Secondary | ICD-10-CM

## 2012-03-03 DIAGNOSIS — E875 Hyperkalemia: Secondary | ICD-10-CM | POA: Diagnosis present

## 2012-03-03 DIAGNOSIS — N179 Acute kidney failure, unspecified: Secondary | ICD-10-CM | POA: Diagnosis present

## 2012-03-03 DIAGNOSIS — F0280 Dementia in other diseases classified elsewhere without behavioral disturbance: Secondary | ICD-10-CM | POA: Diagnosis present

## 2012-03-03 DIAGNOSIS — F329 Major depressive disorder, single episode, unspecified: Secondary | ICD-10-CM | POA: Diagnosis present

## 2012-03-03 DIAGNOSIS — K31811 Angiodysplasia of stomach and duodenum with bleeding: Principal | ICD-10-CM

## 2012-03-03 DIAGNOSIS — N183 Chronic kidney disease, stage 3 unspecified: Secondary | ICD-10-CM | POA: Diagnosis present

## 2012-03-03 DIAGNOSIS — G8929 Other chronic pain: Secondary | ICD-10-CM | POA: Diagnosis present

## 2012-03-03 DIAGNOSIS — D62 Acute posthemorrhagic anemia: Secondary | ICD-10-CM | POA: Diagnosis present

## 2012-03-03 DIAGNOSIS — Z794 Long term (current) use of insulin: Secondary | ICD-10-CM

## 2012-03-03 DIAGNOSIS — G4733 Obstructive sleep apnea (adult) (pediatric): Secondary | ICD-10-CM | POA: Diagnosis present

## 2012-03-03 DIAGNOSIS — K219 Gastro-esophageal reflux disease without esophagitis: Secondary | ICD-10-CM | POA: Diagnosis present

## 2012-03-03 DIAGNOSIS — Z951 Presence of aortocoronary bypass graft: Secondary | ICD-10-CM

## 2012-03-03 DIAGNOSIS — T502X5A Adverse effect of carbonic-anhydrase inhibitors, benzothiadiazides and other diuretics, initial encounter: Secondary | ICD-10-CM | POA: Diagnosis present

## 2012-03-03 DIAGNOSIS — E119 Type 2 diabetes mellitus without complications: Secondary | ICD-10-CM | POA: Diagnosis present

## 2012-03-03 DIAGNOSIS — M109 Gout, unspecified: Secondary | ICD-10-CM | POA: Diagnosis present

## 2012-03-03 DIAGNOSIS — N189 Chronic kidney disease, unspecified: Secondary | ICD-10-CM

## 2012-03-03 DIAGNOSIS — M129 Arthropathy, unspecified: Secondary | ICD-10-CM | POA: Diagnosis present

## 2012-03-03 DIAGNOSIS — I129 Hypertensive chronic kidney disease with stage 1 through stage 4 chronic kidney disease, or unspecified chronic kidney disease: Secondary | ICD-10-CM | POA: Diagnosis present

## 2012-03-03 LAB — CBC
Hemoglobin: 8.1 g/dL — ABNORMAL LOW (ref 13.0–17.0)
MCH: 28.3 pg (ref 26.0–34.0)
Platelets: 238 10*3/uL (ref 150–400)
RBC: 2.86 MIL/uL — ABNORMAL LOW (ref 4.22–5.81)
WBC: 8.2 10*3/uL (ref 4.0–10.5)

## 2012-03-03 LAB — COMPREHENSIVE METABOLIC PANEL
ALT: 15 U/L (ref 0–53)
AST: 22 U/L (ref 0–37)
Alkaline Phosphatase: 58 U/L (ref 39–117)
CO2: 22 mEq/L (ref 19–32)
Calcium: 9.7 mg/dL (ref 8.4–10.5)
Chloride: 97 mEq/L (ref 96–112)
GFR calc Af Amer: 16 mL/min — ABNORMAL LOW (ref >90)
GFR calc non Af Amer: 14 mL/min — ABNORMAL LOW (ref >90)
Glucose, Bld: 191 mg/dL — ABNORMAL HIGH (ref 70–99)
Potassium: 5 mEq/L (ref 3.5–5.1)
Sodium: 134 mEq/L — ABNORMAL LOW (ref 135–145)
Total Bilirubin: 0.2 mg/dL — ABNORMAL LOW (ref 0.3–1.2)

## 2012-03-03 LAB — TROPONIN I: Troponin I: <0.3 ng/mL (ref <0.30)

## 2012-03-03 LAB — GLUCOSE, CAPILLARY: Glucose-Capillary: 179 mg/dL — ABNORMAL HIGH (ref 70–99)

## 2012-03-03 MED ORDER — INSULIN GLARGINE 100 UNIT/ML ~~LOC~~ SOLN: 53.0000 [IU] | Freq: Two times a day (BID) | SUBCUTANEOUS | Status: DC

## 2012-03-03 MED ORDER — DICLOFENAC SODIUM 0.1 % OP SOLN
1.0000 [drp] | Freq: Two times a day (BID) | OPHTHALMIC | Status: DC
  Administered 2012-03-05: 1 [drp] via OPHTHALMIC
  Filled 2012-03-03 (×3): qty 2.5

## 2012-03-03 MED ORDER — INSULIN GLARGINE 100 UNIT/ML ~~LOC~~ SOLN
56.0000 [IU] | Freq: Every day | SUBCUTANEOUS | Status: DC
  Administered 2012-03-03: 56 [IU] via SUBCUTANEOUS

## 2012-03-03 MED ORDER — MUPIROCIN 2 % EX OINT
1.0000 "application " | TOPICAL_OINTMENT | Freq: Two times a day (BID) | CUTANEOUS | Status: DC
  Administered 2012-03-04 – 2012-03-06 (×5): 1 via NASAL
  Filled 2012-03-03 (×2): qty 22

## 2012-03-03 MED ORDER — INSULIN GLARGINE 100 UNIT/ML ~~LOC~~ SOLN: 53.0000 [IU] | Freq: Every day | SUBCUTANEOUS | Status: DC

## 2012-03-03 MED ORDER — SODIUM CHLORIDE 0.9 % IV SOLN: INTRAVENOUS | Status: DC

## 2012-03-03 MED ORDER — PANTOPRAZOLE SODIUM 40 MG IV SOLR
40.0000 mg | Freq: Two times a day (BID) | INTRAVENOUS | Status: DC
  Administered 2012-03-03 – 2012-03-05 (×4): 40 mg via INTRAVENOUS
  Filled 2012-03-03 (×5): qty 40

## 2012-03-03 MED ORDER — ALBUTEROL SULFATE HFA 108 (90 BASE) MCG/ACT IN AERS: 2.0000 | INHALATION_SPRAY | RESPIRATORY_TRACT | Status: DC | PRN

## 2012-03-03 MED ORDER — AMLODIPINE BESYLATE 10 MG PO TABS
10.0000 mg | ORAL_TABLET | Freq: Every day | ORAL | Status: DC
  Administered 2012-03-03 – 2012-03-06 (×4): 10 mg via ORAL
  Filled 2012-03-03 (×4): qty 1

## 2012-03-03 MED ORDER — TIOTROPIUM BROMIDE MONOHYDRATE 18 MCG IN CAPS
18.0000 ug | ORAL_CAPSULE | Freq: Every day | RESPIRATORY_TRACT | Status: DC
  Administered 2012-03-05 – 2012-03-06 (×2): 18 ug via RESPIRATORY_TRACT
  Filled 2012-03-03: qty 5

## 2012-03-03 MED ORDER — ONDANSETRON HCL 4 MG PO TABS
4.0000 mg | ORAL_TABLET | Freq: Four times a day (QID) | ORAL | Status: DC | PRN
  Administered 2012-03-05: 4 mg via ORAL
  Filled 2012-03-03: qty 1

## 2012-03-03 MED ORDER — ACETAMINOPHEN 650 MG RE SUPP: 650.0000 mg | Freq: Four times a day (QID) | RECTAL | Status: DC | PRN

## 2012-03-03 MED ORDER — ONDANSETRON HCL 4 MG/2ML IJ SOLN
4.0000 mg | Freq: Four times a day (QID) | INTRAMUSCULAR | Status: DC | PRN
  Filled 2012-03-03: qty 2

## 2012-03-03 MED ORDER — CHLORHEXIDINE GLUCONATE CLOTH 2 % EX PADS
6.0000 | MEDICATED_PAD | Freq: Every day | CUTANEOUS | Status: DC
  Administered 2012-03-04 – 2012-03-06 (×3): 6 via TOPICAL

## 2012-03-03 MED ORDER — CLONIDINE HCL ER 0.1 MG PO TB12
0.1000 mg | ORAL_TABLET | Freq: Two times a day (BID) | ORAL | Status: DC
  Administered 2012-03-03 – 2012-03-06 (×6): 0.1 mg via ORAL
  Filled 2012-03-03 (×8): qty 1

## 2012-03-03 MED ORDER — LEVALBUTEROL HCL 0.63 MG/3ML IN NEBU
0.6300 mg | INHALATION_SOLUTION | Freq: Four times a day (QID) | RESPIRATORY_TRACT | Status: DC
  Administered 2012-03-04 – 2012-03-06 (×7): 0.63 mg via RESPIRATORY_TRACT
  Filled 2012-03-03 (×11): qty 3

## 2012-03-03 MED ORDER — ISOSORBIDE MONONITRATE ER 60 MG PO TB24
60.0000 mg | ORAL_TABLET | Freq: Every day | ORAL | Status: DC
  Administered 2012-03-04 – 2012-03-06 (×3): 60 mg via ORAL
  Filled 2012-03-03 (×3): qty 1

## 2012-03-03 MED ORDER — SODIUM CHLORIDE 0.9 % IJ SOLN
3.0000 mL | Freq: Two times a day (BID) | INTRAMUSCULAR | Status: DC
  Administered 2012-03-04: 3 mL via INTRAVENOUS
  Administered 2012-03-05: 10 mL via INTRAVENOUS
  Administered 2012-03-05 – 2012-03-06 (×2): 3 mL via INTRAVENOUS

## 2012-03-03 MED ORDER — ACETAMINOPHEN 325 MG PO TABS
650.0000 mg | ORAL_TABLET | Freq: Four times a day (QID) | ORAL | Status: DC | PRN
  Filled 2012-03-03: qty 2

## 2012-03-03 MED ORDER — INSULIN ASPART 100 UNIT/ML ~~LOC~~ SOLN
0.0000 [IU] | Freq: Three times a day (TID) | SUBCUTANEOUS | Status: DC
  Administered 2012-03-05 – 2012-03-06 (×2): 3 [IU] via SUBCUTANEOUS
  Administered 2012-03-06: 5 [IU] via SUBCUTANEOUS

## 2012-03-03 NOTE — Telephone Encounter (Signed)
Dr. Hennie Duos called and states that the pts Hgb is 7, BP 124/40, and he has heme positive stools. Dr. Hennie Duos thinks he needs to be hospitalized and wanted to know how Dr. Marina Goodell would handle that. Dr. Hennie Duos is going to have the pt go to the ER and be admitted by the hospitalist and call us for a GI consult. Dr. Marina Goodell aware.

## 2012-03-03 NOTE — Progress Notes (Signed)
Patient came positive  for MRSA PCR

## 2012-03-03 NOTE — H&P (Addendum)
[facility] History and Physical  [name] XLK:[id number] DOB: [birth year] DOA: 03/03/2012  Referring physician: Dr. Hennie Duos   PCP: Kaleen Mask, MD   Chief Complaint: Shortness of breath, melena HPI:  78 year old male with a former smoker, with a history of sleep apnea, diabetes, history of chronic anemia, extensive workup by Dr. Marina Goodell, revealing gastric AVM, diverticulosis, small bone erosions, apparently with a hemoglobin of 11, 8-10 weeks ago, who was sent to the hospital for further evaluation of his anemia as well as shortness of breath. The patient was noted to have a hemoglobin of 8.4 , 3 weeks ago. Dr. Jeannetta Nap started him on oral iron. He was asked to return in about 3 weeks for a followup. His hemoglobin is lower. The patient states that he has been dizzy and lightheaded for the last 3 weeks. An extremely short of breath. He has had some nonproductive cough as well as wheezing, dyspnea on exertion, denies any chest pain fever chills rigors  He takes aspirin on a daily basis, he also takes prednisone on a daily basis, , for his history of gout. He denies using NSAIDs.      Review of Systems: negative for the following   Constitutional: Chills, Fever, Unexplained weight loss, Unexplained recent fatigue, Change in appetite  Eyes: Blurred vision, redness in eyes, pain in eyes  Ears, Nose, Mouth, Throat: Unexplained decreased hearing, sore throat swollen glands  Cardiovascular: Chest pain, irregular heartbeat, frequent dizziness upon standing up, shortness of breath lying down  Respiratory: Cough, wheezing, shortness of breath   Gastrointestinal: Moderate to severe abdominal pain, diarrhea, blood in stool  Genitourinary: Blood in urine, difficulty urinating. Women only, change in menstrual period, irregular menstrual periods, excessively heavy bleeding   Musculoskeletal: Arthritis, abnormal weakness of muscles   Breasts: New breast lumps, nipple discharge or  irritation   Neurological: Dizziness, numbing/tingling, loss of balance when walking   Psychiatric: Depression, mood swings, significant and unexplained memory loss  Endocrine: Excessively hot or cold, unexplained weight loss or gain   Hematologic/Lymphatic: Swollen glands, easy bruising   Allergic/Immunologic: Hives, runny nose, sneezing      Past Medical History  Diagnosis Date  . Adenomatous colon polyp   . Diverticulosis   . GERD (gastroesophageal reflux disease)   . Hiatal hernia   . Alzheimer disease   . Gout     "hands; backbone; elbows; shoulders"  . Asthma with bronchitis   . COPD (chronic obstructive pulmonary disease)   . Chronic hypoxemic respiratory failure   . OSA (obstructive sleep apnea)   . CHF (congestive heart failure)   . Blood transfusion     "I've had about 6 pints" (12/31/2011)  . Pneumonia   . Anemia   . GI bleed   . Coronary artery disease   . Hypertension   . Peripheral vascular disease     PAD  . Dysrhythmia     "skips"  . Anginal pain   . Myocardial infarction     "they said I had 2 light ones"  . Shortness of breath 12/31/2011    "all the time"  . Type II diabetes mellitus   . Arthritis     "plenty"  . Chronic lower back pain   . Renal insufficiency     "went on dialysis probably 3 times"  . Skin cancer     "forehead; arms"  . Depression      Past Surgical History  Procedure Date  . Hand surgery 1970's    "  chain saw accident; had to reattach fingers on left hand"  . Esophagogastroduodenoscopy 05/05/2011    Procedure: ESOPHAGOGASTRODUODENOSCOPY (EGD);  Surgeon: Louis Meckel, MD;  Location: [facility];  Service: Endoscopy;  Laterality: N/A;  . Flexible sigmoidoscopy 05/05/2011    Procedure: FLEXIBLE SIGMOIDOSCOPY;  Surgeon: Louis Meckel, MD;  Location: [facility];  Service: Endoscopy;  Laterality: N/A;  . Laparoscopic cholecystectomy ~ 2001  . Excisional hemorrhoidectomy 1950's  . Cataract extraction w/ intraocular lens   implant, bilateral   . Coronary artery bypass graft 1997    CABG X2  . Iliac artery stent 11/2011    left/H&P  . Renal artery stent 2010    left/H&P  . Iliac artery stent 11/2011    right  . Coronary angioplasty with stent placement 12/31/2011    "I have 15 stents; heart, kidney, aorta, legs"  . Colonoscopy w/ biopsies and polypectomy     "have had probably 5 cut out" (12/31/2011)     Family History   Problem  Relation  Age of Onset   .  Lung cancer  Brother    .  Diabetes  Mother    .  Heart disease  Sister    .  Stomach cancer  Maternal Grandfather    .  Heart disease  Father    .  Heart disease  Brother    .  Arthritis  Mother    .  ALS  Sister    .  ALS        neice    .  ALS        nephew   Social History: reports that he has quit smoking. He quit smokeless tobacco use about 21 years ago. He reports that he does not drink alcohol or use illicit drugs.   Allergies  Allergen Reactions  . Donepezil Diarrhea  . Heparin Itching and Rash  . Hydralazine Other (See Comments)    DO NOT TAKE PER MD  . Hydromorphone Itching  . Morphine Itching  . Plavix (Clopidogrel Bisulfate) Other (See Comments)    GI BLEEDING; "so bad I had to take blood; dr took me off it"  . Promethazine Other (See Comments)    DO NOT TAKE PER MD  . Sulfonamide Derivatives Itching  . Zaroxolyn (Metolazone) Diarrhea  . Cefuroxime Other (See Comments)    REACTION: unkown reaction  . Metoprolol Other (See Comments)    unknown  . Ertapenem Itching and Rash    12/31/2011 pt does not recall allergy or severity    Family History  Problem Relation Age of Onset  . Lung cancer Brother   . Diabetes Mother   . Heart disease Sister   . Stomach cancer Maternal Grandfather   . Heart disease Father   . Heart disease Brother   . Arthritis Mother   . ALS Sister   . ALS      neice  . ALS      nephew     Prior to Admission medications   Medication Sig Start Date End Date Taking? Authorizing Provider    acetaminophen (TYLENOL) 650 MG CR tablet Take 1,300 mg by mouth 2 (two) times daily.    Yes Historical Provider, MD  albuterol (PROAIR HFA) 108 (90 BASE) MCG/ACT inhaler Inhale 2 puffs into the lungs every 4 (four) hours as needed for wheezing or shortness of breath. 10/08/11 10/07/12 Yes Clinton D Young, MD  albuterol (PROVENTIL) (2.5 MG/3ML) 0.083% nebulizer solution Take 2.5 mg by nebulization  every 6 (six) hours as needed. For shortness of breath   Yes Historical Provider, MD  allopurinol (ZYLOPRIM) 300 MG tablet Take 300 mg by mouth 2 (two) times daily.   Yes Historical Provider, MD  aspirin 325 MG tablet Take 325 mg by mouth daily.     Yes Historical Provider, MD  atorvastatin (LIPITOR) 20 MG tablet Take 20 mg by mouth at bedtime.   Yes Historical Provider, MD  bumetanide (BUMEX) 2 MG tablet Take 1 tablet (2 mg total) by mouth 2 (two) times daily. 01/03/12  Yes Abelino Derrick, PA  cloNIDine HCl (KAPVAY) 0.1 MG TB12 ER tablet Take 1 tablet (0.1 mg total) by mouth 2 (two) times daily. 12/11/11  Yes Wilburt Finlay, PA  colchicine 0.6 MG tablet Take 0.6 mg by mouth daily.    Yes Historical Provider, MD  escitalopram (LEXAPRO) 10 MG tablet Take 20 mg by mouth daily.    Yes Historical Provider, MD  ezetimibe (ZETIA) 10 MG tablet Take 10 mg by mouth daily.     Yes Historical Provider, MD  folic acid (FOLVITE) 400 MCG tablet Take 400 mcg by mouth daily.     Yes Historical Provider, MD  gabapentin (NEURONTIN) 100 MG tablet Take 100 mg by mouth at bedtime.     Yes Historical Provider, MD  insulin aspart (NOVOLOG) 100 UNIT/ML injection Inject 10-20 Units into the skin 3 (three) times daily before meals. 10 units in the morning, 20 units at noon and 20 units in the evening   Yes Historical Provider, MD  insulin glargine (LANTUS) 100 UNIT/ML injection Inject 57 Units into the skin 2 (two) times daily.    Yes Historical Provider, MD  isosorbide mononitrate (IMDUR) 30 MG 24 hr tablet Take 60 mg by mouth daily.   03/09/11  Yes Historical Provider, MD  lisinopril (PRINIVIL,ZESTRIL) 5 MG tablet Take 1 tablet (5 mg total) by mouth daily. 01/03/12 01/02/13 Yes Abelino Derrick, PA  metolazone (ZAROXOLYN) 5 MG tablet Take 5 mg by mouth every other day. 30 minutes before Bumex dose   Yes Historical Provider, MD  Multiple Vitamin (MULTIVITAMIN WITH MINERALS) TABS Take 0.5 tablets by mouth 2 (two) times daily.   Yes Historical Provider, MD  nitroGLYCERIN (NITROSTAT) 0.4 MG SL tablet Place 0.4 mg under the tongue every 5 (five) minutes as needed. May repeat x 3 for chest pain   Yes Historical Provider, MD  pantoprazole (PROTONIX) 40 MG tablet Take 40 mg by mouth daily.     Yes Historical Provider, MD  predniSONE (DELTASONE) 5 MG tablet Take 5 mg by mouth daily.     Yes Historical Provider, MD  tiotropium (SPIRIVA) 18 MCG inhalation capsule Place 18 mcg into inhaler and inhale daily.   04/21/11 04/20/12 Yes Clinton D Young, MD  amLODipine (NORVASC) 10 MG tablet Take 1 tablet (10 mg total) by mouth daily. 12/11/11 12/10/12  Wilburt Finlay, PA     Physical Exam: Filed Vitals:   03/03/12 1529 03/03/12 1600 03/03/12 1603  BP: 134/46 126/50   Pulse: 62 62   Temp: 97.9 F (36.6 C)  97.8 F (36.6 C)  TempSrc: Oral  Oral  Resp: 17 16   Height: 5\' 8"  (1.727 m)    Weight: 110.2 kg (242 lb 15.2 oz)    SpO2: 100% 99%     General- Alert, Oriented, Affect-appropriate, Distress- none acute; obese  Skin- rash-none, lesions- none, excoriation- none  Lymphadenopathy- none  Head- atraumatic  Eyes- Gross vision intact, PERRLA, conjunctivae  clear secretions  Ears- Hearing, canals-normal  Nose- Clear, no-Septal dev, mucus, polyps, erosion, perforation  Throat- Mallampati II , mucosa clear , drainage- none, tonsils- atrophic  Neck- flexible , trachea midline, no stridor , thyroid nl, carotid no bruit  Chest - symmetrical excursion , unlabored  Heart/CV- RRR , no murmur , no gallop , no rub, nl s1 s2  - JVD+ 1 cm , edema+ trace,  stasis changes- none, varices- none  Lung- clear, unlabored, wheeze- none, cough+ with deep breath , dullness-none, rub- none  Chest wall-  Abd-  Br/ Gen/ Rectal- Not done, not indicated  Extrem- cyanosis- none, clubbing, none, atrophy- none, strength- nl  Neuro- grossly intact exam, cranial nerves 2-12 grossly intact       Labs on Admission:    Basic Metabolic Panel: No results found for this basename: NA:5,K:5,CL:5,CO2:5,GLUCOSE:5,BUN:5,CREATININE:5,CALCIUM:5,MG:5,PHOS:5 in the last 168 hours Liver Function Tests: No results found for this basename: AST:5,ALT:5,ALKPHOS:5,BILITOT:5,PROT:5,ALBUMIN:5 in the last 168 hours No results found for this basename: LIPASE:5,AMYLASE:5 in the last 168 hours No results found for this basename: AMMONIA:5 in the last 168 hours CBC: No results found for this basename: WBC:5,NEUTROABS:5,HGB:5,HCT:5,MCV:5,PLT:5 in the last 168 hours Cardiac Enzymes: No results found for this basename: CKTOTAL:5,CKMB:5,CKMBINDEX:5,TROPONINI:5 in the last 168 hours  BNP (last 3 results)  Basename 01/02/12 1130 01/02/12 0550 12/31/11 1217  PROBNP 945.2* 1007.0* 992.2*      CBG: No results found for this basename: GLUCAP:5 in the last 168 hours  Radiological Exams on Admission: No results found.  EKG: Independently reviewed. Stable Assessment/Plan Principal Problem:  *Melena Active Problems:  DM  OSA- on c-pap  GI BLEED, 12/12- gastric AVM ablated  S/P CABG x 3 '97, PCI '09, cath 4/11- medical Rx  HTN (hypertension)  Obesity   1. Anemia previous hemoglobins have ranged from 7.4 to 6.9 previous patient has an extensive GI workup including colonoscopy upper endoscopy capsule endoscopy, his last workup was in December of 2012 that revealed a nonbleeding gastric AVM. Patient continues to take aspirin on a daily basis. Hemoglobin earlier this year was around 11.3. GI has been notified. Follow serial CBC, we'll transfuse 1 unit of packed red blood cells  the patient is symptomatic 2. Shortness of breath history of COPD, obstructive sleep apnea, congestive heart failure, last 2-D echo shows an EF of 65-70% done in August of 2013. Will obtain EKG, cardiac enzymes, transfuse one unit of packed red blood cells, ches t x-ray, hold Bumex until patient's creatinine is resulted 3. Chronic kidney disease, stage III, baseline creatinine of 1.6 by number labs pending at this time 4. Diabetes start the patient on sliding scale insulin 5. Sleep apnea continue the patient on CPAP at bedtime 6. Gout hold off on prednisone orally, 7. Coronary artery disease,S/P CABG x 3 '97, PCI '09, cath 4/11, hold aspirin, continue imdur 8. Hypertension hold off on ACE inhibitor, Bumex for now  Code Status:   full Family Communication: bedside Disposition Plan: admit   Time spent: 70 mins   [facility] Pager [phone]  If 7PM-7AM, please contact night-coverage [email or web address] Password [email or web address] 03/03/2012, 4:07 PM

## 2012-03-03 NOTE — Consult Note (Signed)
Pt seen and examined.  He has had a subacute GI bleed.  Based on extensive prior workup I suspect he has chronic, intermittent bleeding from AVMs.  Recommend EGD/enteroscopy - scheduled for am. Transfuse to Hg 8-9.

## 2012-03-03 NOTE — Consult Note (Signed)
Referring Provider: No ref. provider found Primary Care Physician:  Kaleen Mask, MD Primary Gastroenterologist:  Dr. Marina Goodell  Reason for Consultation:  Anemia, heme positive  HPI: [name] is a 78 y.o. male with multiple medical problems who was sent to the ED by his PCP when he was found to have a Hgb of 7.3 grams and heme positive stools earlier today.  Hgb two months ago was 10.2 grams.  Says that the last few weeks he has been more fatigued and SOB.  Saw PCP a couple of weeks ago and Hgb was in the 8 gram range and iron studies were low.  Patient was placed on iron 325 mg daily.  Stools have been quite dark recently, even for a couple of weeks prior to the iron supplements.  Takes daily ASA 325 mg.  Pantoprazole 40 mg daily at home.  No NSAID's.  Was not taking iron supplements at home until recently.  Occasional nausea, but no vomiting.  No appetite recently.  Complains of abdominal pain in left side of abdomen, which has been present for a few months at least; says that he wonders if it is the artery stents causing the pain.    Previous evaluations for anemia and Hemoccult-positive stool. Hospitalized in December of 2009 with probable diverticular bleed. Extensive GI workup including colonoscopy, upper endoscopy, and capsule endoscopy (04/2008). Aside from diverticular disease, no significant abnormalities. Diminutive erosion and erythema in the small bowel noted. He does have chronic renal insufficiency. Hospitalized in December with acute bronchitis and fatigue. Anemic on admission with a hemoglobin of 7.9. Subsequently drifted to 6.7. He had been having some intermittent hemorrhoidal bleeding. No melena or hematochezia. He underwent upper endoscopy and flexible sigmoidoscopy with Dr. Arlyce Dice. Upper endoscopy revealed one nonbleeding gastric AVM which was ablated. Normal left colon.  Saw Dr. Marina Goodell in 05/2011 for follow-up, which was the last time he was seen by GI.  Hemoglobin at that  time was 11.3 grams.  Recommendations were to continue iron therapy and have periodic CBC with PCP with transfusion if necessary.  Was directed to return to GI for subacute or acute GI bleeding.   Past Medical History  Diagnosis Date  . Adenomatous colon polyp   . Diverticulosis   . GERD (gastroesophageal reflux disease)   . Hiatal hernia   . Alzheimer disease   . Gout     "hands; backbone; elbows; shoulders"  . Asthma with bronchitis   . COPD (chronic obstructive pulmonary disease)   . Chronic hypoxemic respiratory failure   . OSA (obstructive sleep apnea)   . CHF (congestive heart failure)   . Blood transfusion     "I've had about 6 pints" (12/31/2011)  . Pneumonia   . Anemia   . GI bleed   . Coronary artery disease   . Hypertension   . Peripheral vascular disease     PAD  . Dysrhythmia     "skips"  . Anginal pain   . Myocardial infarction     "they said I had 2 light ones"  . Shortness of breath 12/31/2011    "all the time"  . Type II diabetes mellitus   . Arthritis     "plenty"  . Chronic lower back pain   . Renal insufficiency     "went on dialysis probably 3 times"  . Skin cancer     "forehead; arms"  . Depression     Past Surgical History  Procedure Date  . Hand surgery  1970's    "chain saw accident; had to reattach fingers on left hand"  . Esophagogastroduodenoscopy 05/05/2011    Procedure: ESOPHAGOGASTRODUODENOSCOPY (EGD);  Surgeon: Louis Meckel, MD;  Location: [facility];  Service: Endoscopy;  Laterality: N/A;  . Flexible sigmoidoscopy 05/05/2011    Procedure: FLEXIBLE SIGMOIDOSCOPY;  Surgeon: Louis Meckel, MD;  Location: [facility];  Service: Endoscopy;  Laterality: N/A;  . Laparoscopic cholecystectomy ~ 2001  . Excisional hemorrhoidectomy 1950's  . Cataract extraction w/ intraocular lens  implant, bilateral   . Coronary artery bypass graft 1997    CABG X2  . Iliac artery stent 11/2011    left/H&P  . Renal artery stent 2010    left/H&P  .  Iliac artery stent 11/2011    right  . Coronary angioplasty with stent placement 12/31/2011    "I have 15 stents; heart, kidney, aorta, legs"  . Colonoscopy w/ biopsies and polypectomy     "have had probably 5 cut out" (12/31/2011)    Prior to Admission medications   Medication Sig Start Date End Date Taking? Authorizing Provider  acetaminophen (TYLENOL) 650 MG CR tablet Take 1,300 mg by mouth 2 (two) times daily.     Historical Provider, MD  albuterol (PROAIR HFA) 108 (90 BASE) MCG/ACT inhaler Inhale 2 puffs into the lungs every 4 (four) hours as needed for wheezing or shortness of breath. 10/08/11 10/07/12  Waymon Budge, MD  albuterol (PROVENTIL) (2.5 MG/3ML) 0.083% nebulizer solution Take 2.5 mg by nebulization every 6 (six) hours as needed. For shortness of breath    Historical Provider, MD  allopurinol (ZYLOPRIM) 300 MG tablet Take 1 tablet by mouth Twice daily. 02/17/11   Historical Provider, MD  amLODipine (NORVASC) 10 MG tablet Take 1 tablet (10 mg total) by mouth daily. 12/11/11 12/10/12  Wilburt Finlay, PA  aspirin 325 MG tablet Take 325 mg by mouth daily.      Historical Provider, MD  atorvastatin (LIPITOR) 20 MG tablet Take 1 tablet by mouth daily. 02/24/11   Historical Provider, MD  bumetanide (BUMEX) 2 MG tablet Take 1 tablet (2 mg total) by mouth 2 (two) times daily. 01/03/12   Abelino Derrick, PA  cloNIDine HCl (KAPVAY) 0.1 MG TB12 ER tablet Take 1 tablet (0.1 mg total) by mouth 2 (two) times daily. 12/11/11   Wilburt Finlay, PA  colchicine 0.6 MG tablet Take 0.6 mg by mouth daily.     Historical Provider, MD  diclofenac (VOLTAREN) 0.1 % ophthalmic solution Place 1 drop into both eyes 2 (two) times daily.      Historical Provider, MD  escitalopram (LEXAPRO) 10 MG tablet Take 20 mg by mouth daily.     Historical Provider, MD  ezetimibe (ZETIA) 10 MG tablet Take 10 mg by mouth daily.      Historical Provider, MD  folic acid (FOLVITE) 400 MCG tablet Take 400 mcg by mouth daily.      Historical  Provider, MD  gabapentin (NEURONTIN) 100 MG tablet Take 100 mg by mouth at bedtime.      Historical Provider, MD  insulin glargine (LANTUS) 100 UNIT/ML injection Inject 53-56 Units into the skin 2 (two) times daily. 53 units in the morning and 56 units in the evening    Historical Provider, MD  isosorbide mononitrate (IMDUR) 30 MG 24 hr tablet Take 60 mg by mouth daily.  03/09/11   Historical Provider, MD  lisinopril (PRINIVIL,ZESTRIL) 5 MG tablet Take 1 tablet (5 mg total) by mouth daily. 01/03/12 01/02/13  Abelino Derrick, PA  nitroGLYCERIN (NITROSTAT) 0.4 MG SL tablet Place 0.4 mg under the tongue every 5 (five) minutes as needed. May repeat x 3 for chest pain    Historical Provider, MD  NOVOLOG FLEXPEN 100 UNIT/ML injection Inject 10-20 Units into the skin 3 (three) times daily. 10 units in the morning,20 units at noon, and 20 in the evening before meals 02/24/11   Historical Provider, MD  pantoprazole (PROTONIX) 40 MG tablet Take 40 mg by mouth daily.      Historical Provider, MD  predniSONE (DELTASONE) 5 MG tablet Take 5 mg by mouth daily.      Historical Provider, MD  tiotropium (SPIRIVA) 18 MCG inhalation capsule Place 18 mcg into inhaler and inhale daily.   04/21/11 04/20/12  Waymon Budge, MD    No current facility-administered medications for this encounter.    Allergies as of 03/03/2012 - Review Complete 03/03/2012  Allergen Reaction Noted  . Donepezil Diarrhea 05/02/2011  . Heparin Itching and Rash   . Hydralazine Other (See Comments) 05/02/2011  . Hydromorphone Itching 05/02/2011  . Morphine Itching   . Plavix (clopidogrel bisulfate) Other (See Comments) 05/02/2011  . Promethazine Other (See Comments) 05/02/2011  . Sulfonamide derivatives Itching 05/22/2008  . Zaroxolyn (metolazone) Diarrhea 05/02/2011  . Cefuroxime Other (See Comments)   . Metoprolol Other (See Comments)   . Ertapenem Itching and Rash 05/02/2011    Family History  Problem Relation Age of Onset  . Lung  cancer Brother   . Diabetes Mother   . Heart disease Sister   . Stomach cancer Maternal Grandfather   . Heart disease Father   . Heart disease Brother   . Arthritis Mother   . ALS Sister   . ALS      neice  . ALS      nephew    History   Social History  . Marital Status: Married    Spouse Name: N/A    Number of Children: 5  . Years of Education: N/A   Occupational History  . retired    Social History Main Topics  . Smoking status: Former Smoker -- 2.5 packs/day for 45 years    Types: Cigarettes    Quit date: 05/25/1990  . Smokeless tobacco: Former Neurosurgeon    Types: Chew    Quit date: 06/01/1990  . Alcohol Use: Yes     12/31/2011 "used to drink; last alcohol has been 30 years I imagine"  . Drug Use: No  . Sexually Active: Not Currently   Other Topics Concern  . Not on file   Social History Narrative   Daily caffeine use    Review of Systems: Ten point ROS is O/W negative except as mentioned in HPI.  Physical Exam: Vital signs in last 24 hours: Temp:  [97.9 F (36.6 C)] 97.9 F (36.6 C) (10/10 1529) Pulse Rate:  [62] 62  (10/10 1529) Resp:  [17] 17  (10/10 1529) BP: (134)/(46) 134/46 mmHg (10/10 1529) SpO2:  [100 %] 100 % (10/10 1529) Weight:  [242 lb 15.2 oz (110.2 kg)] 242 lb 15.2 oz (110.2 kg) (10/10 1529)   General:   Alert, Well-developed, well-nourished, pleasant and cooperative in NAD. Head:  Normocephalic and atraumatic. Eyes:  Sclera clear, no icterus.  Conjunctiva pink. Ears:  Normal auditory acuity. Mouth:  No deformity or lesions.   Lungs:  Decreased breath sounds B/L.  Some wheezing noted. Heart:  Regular rate and rhythm; murmur noted. Abdomen:  Soft, non-distended.  BS present.  Mild LLQ TTP.  Some bruising on abdomen from insulin injections. Rectal:  Deferred.  Heme positive at PCP.  Msk:  Symmetrical without gross deformities. Pulses:  Normal pulses noted. Extremities:  Without clubbing or edema. Neurologic:  Alert and  oriented x4;   grossly normal neurologically. Skin:  Intact without significant lesions or rashes. Psych:  Alert and cooperative. Normal mood and affect.  IMPRESSION:  -Acute on chronic anemia, likely worsened by GI blood loss -GIB:  Likely upper source with recent melenic stools. -History of gastric AVM with ablation -CKD -PVD/CAD -CHF -COPD  PLAN: -Transfuse to Hgb of 9 grams with cardiac history. -EGD with enteroscopy 10/11.  Check PT/INR in AM. -BID PPI for now as it has been ordered. -Ok to have regular diet tonight.   Chandy Tarman D.  03/03/2012, 3:44 PM  Pager number [phone]

## 2012-03-03 NOTE — Progress Notes (Signed)
Pt arrived to floor with daughter.  Alert and oriented.  Denies pain.  No orders at this time.  Dr. Malachi Bonds notified of pt's arrival.  VSS.  Oriented to unit, verbalized understanding.

## 2012-03-04 ENCOUNTER — Encounter (HOSPITAL_COMMUNITY)
Admission: AD | Disposition: A | Discharge status: Home / Self Care | Payer: Self-pay | Source: Ambulatory Visit | Attending: Internal Medicine

## 2012-03-04 ENCOUNTER — Inpatient Hospital Stay (HOSPITAL_COMMUNITY): Payer: BC Managed Care – PPO

## 2012-03-04 ENCOUNTER — Encounter (HOSPITAL_COMMUNITY): Payer: Self-pay | Admitting: *Deleted

## 2012-03-04 ENCOUNTER — Telehealth: Payer: Self-pay

## 2012-03-04 DIAGNOSIS — N179 Acute kidney failure, unspecified: Secondary | ICD-10-CM

## 2012-03-04 DIAGNOSIS — D62 Acute posthemorrhagic anemia: Secondary | ICD-10-CM

## 2012-03-04 DIAGNOSIS — K922 Gastrointestinal hemorrhage, unspecified: Secondary | ICD-10-CM

## 2012-03-04 DIAGNOSIS — K31811 Angiodysplasia of stomach and duodenum with bleeding: Secondary | ICD-10-CM

## 2012-03-04 HISTORY — PX: ENTEROSCOPY: SHX5533

## 2012-03-04 LAB — URINALYSIS, ROUTINE W REFLEX MICROSCOPIC
Bilirubin Urine: NEGATIVE
Glucose, UA: NEGATIVE mg/dL
Hgb urine dipstick: NEGATIVE
Specific Gravity, Urine: 1.015 (ref 1.005–1.030)
pH: 5 (ref 5.0–8.0)

## 2012-03-04 LAB — CBC
HCT: 32 % — ABNORMAL LOW (ref 39.0–52.0)
MCV: 90.7 fL (ref 78.0–100.0)
RBC: 3.53 MIL/uL — ABNORMAL LOW (ref 4.22–5.81)
RDW: 19.1 % — ABNORMAL HIGH (ref 11.5–15.5)
WBC: 10.3 10*3/uL (ref 4.0–10.5)

## 2012-03-04 LAB — GLUCOSE, CAPILLARY
Glucose-Capillary: 109 mg/dL — ABNORMAL HIGH (ref 70–99)
Glucose-Capillary: 117 mg/dL — ABNORMAL HIGH (ref 70–99)

## 2012-03-04 LAB — OSMOLALITY, URINE: Osmolality, Ur: 397 mOsm/kg (ref 390–1090)

## 2012-03-04 LAB — HEMOGLOBIN A1C: Hgb A1c MFr Bld: 6.4 % — ABNORMAL HIGH (ref <5.7)

## 2012-03-04 LAB — CREATININE, URINE, RANDOM: Creatinine, Urine: 117.86 mg/dL

## 2012-03-04 LAB — COMPREHENSIVE METABOLIC PANEL
Albumin: 4.1 g/dL (ref 3.5–5.2)
Alkaline Phosphatase: 61 U/L (ref 39–117)
BUN: 151 mg/dL — ABNORMAL HIGH (ref 6–23)
Potassium: 4.7 mEq/L (ref 3.5–5.1)
Total Protein: 7.2 g/dL (ref 6.0–8.3)

## 2012-03-04 LAB — TYPE AND SCREEN: Unit division: 0

## 2012-03-04 LAB — TROPONIN I: Troponin I: <0.3 ng/mL (ref <0.30)

## 2012-03-04 LAB — OSMOLALITY: Osmolality: 347 mOsm/kg — ABNORMAL HIGH (ref 275–300)

## 2012-03-04 LAB — PROTIME-INR: Prothrombin Time: 14.3 seconds (ref 11.6–15.2)

## 2012-03-04 SURGERY — ENTEROSCOPY
Anesthesia: Moderate Sedation

## 2012-03-04 MED ORDER — FENTANYL CITRATE 0.05 MG/ML IJ SOLN
INTRAMUSCULAR | Status: DC | PRN
  Administered 2012-03-04: 25 ug via INTRAVENOUS

## 2012-03-04 MED ORDER — FENTANYL CITRATE 0.05 MG/ML IJ SOLN
INTRAMUSCULAR | Status: AC
  Filled 2012-03-04: qty 2

## 2012-03-04 MED ORDER — MIDAZOLAM HCL 5 MG/ML IJ SOLN
INTRAMUSCULAR | Status: AC
  Filled 2012-03-04: qty 2

## 2012-03-04 MED ORDER — MIDAZOLAM HCL 10 MG/2ML IJ SOLN
INTRAMUSCULAR | Status: DC | PRN
  Administered 2012-03-04: 2 mg via INTRAVENOUS

## 2012-03-04 MED ORDER — INSULIN GLARGINE 100 UNIT/ML ~~LOC~~ SOLN
25.0000 [IU] | Freq: Every day | SUBCUTANEOUS | Status: DC
  Administered 2012-03-04 – 2012-03-05 (×2): 25 [IU] via SUBCUTANEOUS

## 2012-03-04 MED ORDER — TRAMADOL HCL 50 MG PO TABS
50.0000 mg | ORAL_TABLET | Freq: Once | ORAL | Status: AC
  Administered 2012-03-04: 50 mg via ORAL
  Filled 2012-03-04: qty 1

## 2012-03-04 NOTE — Op Note (Addendum)
[facility] [address] [place], [address]   ENDOSCOPY PROCEDURE REPORT  PATIENT: [name], [name].  MR#: [id number] BIRTHDATE: [birth year] , 78  yrs. old GENDER: Male ENDOSCOPIST: Louis Meckel, MD REFERRED BY: PROCEDURE DATE:  03/04/2012 PROCEDURE:  EGD w/ control of bleeding and EGD w/ ablation ASA CLASS:     Class III INDICATIONS:  iron deficiency anemia. MEDICATIONS: These medications were titrated to patient response per physician's verbal order, Versed 3 mg IV, and Fentanyl 25 mcg IV TOPICAL ANESTHETIC: Cetacaine Spray  DESCRIPTION OF PROCEDURE: After the risks benefits and alternatives of the procedure were thoroughly explained, informed consent was obtained.  The EC-[id number] ([id number]) endoscope was introduced through the mouth and advanced to the third portion of the duodenum. Without limitations.  The instrument was slowly withdrawn as the mucosa was fully examined.      STOMACH: Bleeding angioectasia were found in the gastric fundus. 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  there is a small amount of fresh red blood. The area was fulgurated utilizing the argon plasma coagulator. Retroflexed views revealed no abnormalities.    The remainder of the exam was entirely normal. The scope was then withdrawn from the patient and the procedure completed.  COMPLICATIONS: There were no complications. ENDOSCOPIC IMPRESSION: Bleeding angioectasia in the gastric fundus - status post treatment with argon plasma coagulator  RECOMMENDATIONS: 1.  continue PPI therapy 2.  followup endoscopy with APC in 2-3 weeks c REPEAT EXAM:  eSigned:  Louis Meckel, MD 03/04/2012 11:31 AM Revised: 03/04/2012 11:31 AM  NF:AOZH Marina Goodell, MD and Waymon Budge, MD

## 2012-03-04 NOTE — Telephone Encounter (Signed)
Left message for pt to call back  °

## 2012-03-04 NOTE — Telephone Encounter (Signed)
Message copied by Chrystie Nose on Fri Mar 04, 2012  3:49 PM ------      Message from: Hilarie Fredrickson      Created: Fri Mar 04, 2012  3:15 PM      Regarding: RE: EGD with APC       I Am not sure that he needs that. Have him see me in the office about one month after discharge. He needs to go home on oral iron therapy 2 or 3 times daily. Also, have him get a CBC a few days before his office visit. Thanks      ----- Message -----         From: Lily Lovings, RN         Sent: 03/04/2012   2:49 PM           To: Hilarie Fredrickson, MD      Subject: EGD with APC                                             Dr. Calvert Cantor did an EGD with APC on the pt today at [facility]. He wrote for pt to have repeat EGD with APC in 2-3 weeks. Do you want him scheduled for the week of Nov 11th when you are Hospital doc?            Thanks,      [employer]

## 2012-03-04 NOTE — Progress Notes (Signed)
Patient has already been transported to endo for his enteroscopy.  INR this AM was 1.13.  Hgb yesterday was 8.1 grams and CBC is still pending for this morning.  I do not believe he received an PRBC's since Hgb returned higher than expected last evening.

## 2012-03-04 NOTE — Progress Notes (Signed)
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 The area was cauterized and fulgurated utilizing the argon plasma coagulator.  Recommendations #1 patient can be discharged today #2 followup endoscopy with APC in 2-3 weeks #3 discharged on PPI therapy  #4 okay to continue ASA

## 2012-03-04 NOTE — Progress Notes (Signed)
Paged Junious Silk, NP regarding pt lantus.  Pt came back from ENDO at ~1150 and had 1000 lantus due.  Pt CBG upon returning was 106, no SSI administered.  Also, pt only ate 15% of lunch tray.  Orders were received to not give 1000 lantus and the 2200 dose was reduced to 25 units.  Will continue to monitor.  Salomon Mast, RN

## 2012-03-04 NOTE — Progress Notes (Signed)
Pt transported to ENDO with RN, IV, and monitor.

## 2012-03-04 NOTE — H&P (View-Only) (Signed)
Patient has already been transported to endo for his enteroscopy.  INR this AM was 1.13.  Hgb yesterday was 8.1 grams and CBC is still pending for this morning.  I do not believe he received an PRBC's since Hgb returned higher than expected last evening. 

## 2012-03-04 NOTE — Progress Notes (Signed)
Pt transferred to 5529 by bed, no O2, no IV, no tele.  Salomon Mast, RN

## 2012-03-04 NOTE — Progress Notes (Signed)
[facility] Progress Note [place] TEAM 1 - Stepdown/ICU TEAM   [name]:[id number] DOB: 05-31-1933 DOA: 03/03/2012 PCP: Kaleen Mask, MD  Brief narrative: 78 year old male patient with multiple medical problems including and gastric AVMs diverticulosis and and small bowel erosions. Known baseline hemoglobin back in August 2013 around 11. Patient apparently had developed progressive shortness of breath with melena and a repeat hemoglobin prior to admission was 8.4. Patient apparently started on iron supplementation 3 weeks previous. Patient endorsed dizziness and lightheadedness for 3 weeks with significant increasing shortness of breath. He endorsed a  nonproductive cough with wheezing, dyspnea on exertion but denied chest pain or other constitutional type symptoms. In reviewing his medications it is noted he was taking aspirin and prednisone and he is also on colchicine. Patient was admitted to the step down unit for further monitoring and treatment. Please note that some lab work was pending at time of admission including patient's electrolyte panel.  Assessment/Plan: Principal Problem:  *GI BLEED (12/12- gastric AVM ablated)/ Melena *No apparent further melena since admission *Previous abdominal pain has *Appreciate gastroenterology assistance-EGD revealed bleeding angio ectasia in gastric fundus s/p argon gas coagulation * Recommend follow up EGD in 2-3 weeks *Was on aspirin and prednisone prior to admission as well as protonix *Continue IV Protonix every 12 hours while here  Active Problems:  Acute posthemorrhagic anemia *Hemoglobin 8.1 at admission and has increased to 10 after one unit of packed red blood cells *Continue to monitor CBC every 12 hours * Suspect renal failure and low epo levels contributing   Acute renal failure on Chronic renal insufficiency, stage III (moderate) - Baseline creatinine1.6 *After admission patient's electrolyte panel was  resulted and showed significant worsening of renal function with a BUN of 150 where previously had been 40 and a creatinine of 3.86. Repeat electrolyte panel this morning demonstrates similar findings BUN 151 and creatinine 3.47 *Patient was on ACE inhibitors diuretics as well as colchicine prior to admission all which could contribute to medication related ATN *FENa calculated at 0.67% which is suggestive of prerenal etiology. Urine sodium is 32.  *Nephrology has been consulted *We will check a renal ultrasound-last was checked 2011 and showed no medical renal disease *Patient also has a history of renal artery stenting *Clinically appears to be dehydrated so we'll check orthostatic vital signs   Diabetes Mellitus Controlled *Hemoglobin A1c 6.4 *Continue Lantus and sliding scale insulin-at home was on very high BID dosing- CBG's here have been in the 100's while NPO so will dc AM dose and decrease PM dose from 57 to 25 and follow   PVD, widespread, ISR bilat Iliac stents, re stented 12/11/11 *No apparent claudication symptoms prior to admission   Diastolic dysfunction, - grade 2 by echo 01/01/12 *Appears to be compensated *Was on Bumex, ACE inhibitor, and Zaroxolyn at home   OSA- on c-pap *Stable   S/P CABG x 3 '97, PCI '09, cath 4/11- medical Rx *Cardiac isoenzymes have been negative x4 since admission *No indications to check an EKG at time of admission *Continue Imdur *Aspirin on hold secondary to acute GI bleeding   HTN (hypertension) *On ACE inhibitor and Norvasc prior to admission-continue *Blood pressure controlled   Obesity   DVT prophylaxis: SCDs Code Status: Full Family Communication: Spoke with patient Disposition Plan: Transfer to floor  Consultants: Nephrology Gastroenterology  Procedures: EGD and colonoscopy for 03/04/2012 by Dr. Randa Evens  Antibiotics: None  HPI/Subjective:    Objective: Blood pressure 137/69, pulse 82, temperature 98  F (36.7 C),  temperature source Oral, resp. rate 21, height 5\' 8"  (1.727 m), weight 114.2 kg (251 lb 12.3 oz), SpO2 98.00%.  Intake/Output Summary (Last 24 hours) at 03/04/12 1048 Last data filed at 03/04/12 0900  Gross per 24 hour  Intake    790 ml  Output   1000 ml  Net   -210 ml     Exam: General: No acute respiratory distress Lungs: Clear to auscultation bilaterally without wheezes or crackles, 2 L nasal cannula oxygen Cardiovascular: Regular rate and rhythm without murmur gallop or rub normal S1 and S2, chronic bilateral lower extremity edema 1+ Abdomen: Nontender, nondistended, soft, bowel sounds positive, no rebound, no ascites, no appreciable mass Musculoskeletal: No significant cyanosis, clubbing of bilateral lower extremities Neurological: Alert and oriented x3, very jocular, patient sitting in wheelchair so gait and ambulation and strength not directly assessed, exam appears to be nonfocal  Data Reviewed: Basic Metabolic Panel:  Lab 03/04/12 1610 03/03/12 1809  NA 140 134*  K 4.7 5.0  CL 100 97  CO2 24 22  GLUCOSE 110* 191*  BUN 151* 150*  CREATININE 3.47* 3.86*  CALCIUM 9.8 9.7  MG -- --  PHOS -- --   Liver Function Tests:  Lab 03/04/12 0904 03/03/12 1809  AST 20 22  ALT 15 15  ALKPHOS 61 58  BILITOT 0.3 0.2*  PROT 7.2 6.9  ALBUMIN 4.1 3.9   No results found for this basename: LIPASE:5,AMYLASE:5 in the last 168 hours No results found for this basename: AMMONIA:5 in the last 168 hours CBC:  Lab 03/04/12 0904 03/03/12 1809  WBC 10.3 8.2  NEUTROABS -- --  HGB 10.0* 8.1*  HCT 32.0* 25.7*  MCV 90.7 89.9  PLT 236 238   Cardiac Enzymes:  Lab 03/04/12 0909 03/04/12 0340 03/03/12 2128 03/03/12 1800  CKTOTAL -- -- -- --  CKMB -- -- -- --  CKMBINDEX -- -- -- --  TROPONINI <0.30 <0.30 <0.30 <0.30   BNP (last 3 results)  Basename 03/03/12 2128 01/02/12 1130 01/02/12 0550  PROBNP 509.1* 945.2* 1007.0*   CBG:  Lab 03/04/12 0846 03/03/12 2225  GLUCAP 109* 179*      Recent Results (from the past 240 hour(s))  MRSA PCR SCREENING     Status: Abnormal   Collection Time   03/03/12  3:33 PM      Component Value Range Status Comment   MRSA by PCR POSITIVE (*) NEGATIVE Final      Studies:  Recent x-ray studies have been reviewed in detail by the Attending Physician  Scheduled Meds:  Reviewed in detail by the Attending Physician   Junious Silk, ANP [facility] Office  [phone] Pager [phone]  On-Call/Text Page:      [email or web address]      password [email or web address]  If 7PM-7AM, please contact night-coverage [email or web address] Password [email or web address] 03/04/2012, 10:48 AM   LOS: 1 day   I have examined the patient and reviewed the chart. I agree with the above note which I have modified.   Calvert Cantor, MD [phone]

## 2012-03-04 NOTE — Consult Note (Addendum)
[facility] Consult Note [facility]  Date: 03/04/2012  Patient name: [name] Medical record number: [id number] Date of birth: [birth year] Age: 78 y.o. Gender: male PCP: Kaleen Mask, MD  Medical Service: DR Gwendalyn Ege physician: Junious Silk    Chief Complaint: Melena, weakness, and SOB  History of Present Illness: [name] is a 78 yo male with a PMH of CKD stage 3, previous GI bleeds, Gastric AVMs, GERD, diverticulosis, COPD, CAD with multiple stent placements, PVD, HTN, anemia (baseline 11), MI, and DM2.  He states he had been weak and dizzy for the past three weeks and was found to have a hemoglobin of 7.3 as well as melena yesterday by his PCP. He was admitted for further work-up. GI did an upper endoscopy this morning and found a gastric angiectasia and treated it. He states he is still feeling weak and dizzy. Denies headache, changes in vision, fever, chills, abd pain, CP, SOB, edema, dysuria, and changes in bowel habits.  No hypotension, no nephrotoxins but was on an ACE inhibitor  We are consulted for an admission Cr of 3.86 yesterday. From previous records pts baseline Cr. Between 1.7-3 over the last year. Pt cr. Is 3.47 today. Pts FENa is .67% Of note the patient was on ACE prior to admission and has had previous renal artery stenting. UA was normal. Renal Ultrasound pending.    Meds: Prior to Admission medications   Medication Sig Start Date End Date Taking? Authorizing Provider  acetaminophen (TYLENOL) 650 MG CR tablet Take 1,300 mg by mouth 2 (two) times daily.    Yes Historical Provider, MD  albuterol (PROAIR HFA) 108 (90 BASE) MCG/ACT inhaler Inhale 2 puffs into the lungs every 4 (four) hours as needed for wheezing or shortness of breath. 10/08/11 10/07/12 Yes Clinton D Young, MD  albuterol (PROVENTIL) (2.5 MG/3ML) 0.083% nebulizer solution Take 2.5 mg by nebulization every 6 (six) hours as needed. For shortness of  breath   Yes Historical Provider, MD  allopurinol (ZYLOPRIM) 300 MG tablet Take 300 mg by mouth 2 (two) times daily.   Yes Historical Provider, MD  aspirin 325 MG tablet Take 325 mg by mouth daily.     Yes Historical Provider, MD  atorvastatin (LIPITOR) 20 MG tablet Take 20 mg by mouth at bedtime.   Yes Historical Provider, MD  bumetanide (BUMEX) 2 MG tablet Take 1 tablet (2 mg total) by mouth 2 (two) times daily. 01/03/12  Yes Abelino Derrick, PA  cloNIDine HCl (KAPVAY) 0.1 MG TB12 ER tablet Take 1 tablet (0.1 mg total) by mouth 2 (two) times daily. 12/11/11  Yes Wilburt Finlay, PA  colchicine 0.6 MG tablet Take 0.6 mg by mouth daily.    Yes Historical Provider, MD  escitalopram (LEXAPRO) 10 MG tablet Take 20 mg by mouth daily.    Yes Historical Provider, MD  ezetimibe (ZETIA) 10 MG tablet Take 10 mg by mouth daily.     Yes Historical Provider, MD  folic acid (FOLVITE) 400 MCG tablet Take 400 mcg by mouth daily.     Yes Historical Provider, MD  gabapentin (NEURONTIN) 100 MG tablet Take 100 mg by mouth at bedtime.     Yes Historical Provider, MD  insulin aspart (NOVOLOG) 100 UNIT/ML injection Inject 10-20 Units into the skin 3 (three) times daily before meals. 10 units in the morning, 20 units at noon and 20 units in the evening   Yes Historical Provider, MD  insulin glargine (LANTUS) 100  UNIT/ML injection Inject 57 Units into the skin 2 (two) times daily.    Yes Historical Provider, MD  isosorbide mononitrate (IMDUR) 30 MG 24 hr tablet Take 60 mg by mouth daily.  03/09/11  Yes Historical Provider, MD  lisinopril (PRINIVIL,ZESTRIL) 5 MG tablet Take 1 tablet (5 mg total) by mouth daily. 01/03/12 01/02/13 Yes Abelino Derrick, PA  metolazone (ZAROXOLYN) 5 MG tablet Take 5 mg by mouth every other day. 30 minutes before Bumex dose   Yes Historical Provider, MD  Multiple Vitamin (MULTIVITAMIN WITH MINERALS) TABS Take 0.5 tablets by mouth 2 (two) times daily.   Yes Historical Provider, MD  nitroGLYCERIN (NITROSTAT)  0.4 MG SL tablet Place 0.4 mg under the tongue every 5 (five) minutes as needed. May repeat x 3 for chest pain   Yes Historical Provider, MD  pantoprazole (PROTONIX) 40 MG tablet Take 40 mg by mouth daily.     Yes Historical Provider, MD  predniSONE (DELTASONE) 5 MG tablet Take 5 mg by mouth daily.     Yes Historical Provider, MD  tiotropium (SPIRIVA) 18 MCG inhalation capsule Place 18 mcg into inhaler and inhale daily.   04/21/11 04/20/12 Yes Clinton D Young, MD  amLODipine (NORVASC) 10 MG tablet Take 1 tablet (10 mg total) by mouth daily. 12/11/11 12/10/12  Wilburt Finlay, PA   Current facility-administered medications:acetaminophen (TYLENOL) suppository 650 mg, 650 mg, Rectal, Q6H PRN, Richarda Overlie, MD;  acetaminophen (TYLENOL) tablet 650 mg, 650 mg, Oral, Q6H PRN, Richarda Overlie, MD;  albuterol (PROVENTIL HFA;VENTOLIN HFA) 108 (90 BASE) MCG/ACT inhaler 2 puff, 2 puff, Inhalation, Q4H PRN, Richarda Overlie, MD;  amLODipine (NORVASC) tablet 10 mg, 10 mg, Oral, Daily, Richarda Overlie, MD, 10 mg at 03/04/12 1237 Chlorhexidine Gluconate Cloth 2 % PADS 6 each, 6 each, Topical, Q0600, Richarda Overlie, MD, 6 each at 03/04/12 0629;  cloNIDine HCl (KAPVAY) ER tablet 0.1 mg, 0.1 mg, Oral, BID, Richarda Overlie, MD, 0.1 mg at 03/03/12 2226;  diclofenac (VOLTAREN) 0.1 % ophthalmic solution 1 drop, 1 drop, Both Eyes, BID, Nayana Abrol, MD;  insulin aspart (novoLOG) injection 0-15 Units, 0-15 Units, Subcutaneous, TID WC, Nayana Abrol, MD insulin glargine (LANTUS) injection 53 Units, 53 Units, Subcutaneous, Daily, Richarda Overlie, MD;  insulin glargine (LANTUS) injection 56 Units, 56 Units, Subcutaneous, QHS, Richarda Overlie, MD, 56 Units at 03/03/12 2222;  isosorbide mononitrate (IMDUR) 24 hr tablet 60 mg, 60 mg, Oral, Daily, Richarda Overlie, MD, 60 mg at 03/04/12 1238;  levalbuterol (XOPENEX) nebulizer solution 0.63 mg, 0.63 mg, Nebulization, Q6H, Richarda Overlie, MD mupirocin ointment (BACTROBAN) 2 % 1 application, 1 application, Nasal, BID, Richarda Overlie, MD;  ondansetron (ZOFRAN) injection 4 mg, 4 mg, Intravenous, Q6H PRN, Richarda Overlie, MD;  ondansetron (ZOFRAN) tablet 4 mg, 4 mg, Oral, Q6H PRN, Richarda Overlie, MD;  pantoprazole (PROTONIX) injection 40 mg, 40 mg, Intravenous, Q12H, Richarda Overlie, MD, 40 mg at 03/04/12 1240;  sodium chloride 0.9 % injection 3 mL, 3 mL, Intravenous, Q12H, Richarda Overlie, MD tiotropium (SPIRIVA) inhalation capsule 18 mcg, 18 mcg, Inhalation, Daily, Richarda Overlie, MD;  DISCONTD: 0.9 %  sodium chloride infusion, , Intravenous, Continuous, Jessica D. Zehr, PA;  DISCONTD: fentaNYL (SUBLIMAZE) injection, , , PRN, Louis Meckel, MD, 25 mcg at 03/04/12 1015;  DISCONTD: insulin glargine (LANTUS) injection 53-56 Units, 53-56 Units, Subcutaneous, BID, Richarda Overlie, MD DISCONTD: midazolam (VERSED) injection, , , PRN, Louis Meckel, MD, 2 mg at 03/04/12 1015  Allergies: Donepezil; Heparin; Hydralazine; Hydromorphone; Morphine; Plavix; Promethazine; Sulfonamide derivatives; Zaroxolyn;  Cefuroxime; Metoprolol; and Ertapenem Past Medical History  Diagnosis Date  . Adenomatous colon polyp   . Diverticulosis   . GERD (gastroesophageal reflux disease)   . Hiatal hernia   . Alzheimer disease   . Gout     "hands; backbone; elbows; shoulders"  . Asthma with bronchitis   . COPD (chronic obstructive pulmonary disease)   . Chronic hypoxemic respiratory failure   . OSA (obstructive sleep apnea)   . CHF (congestive heart failure)   . Blood transfusion     "I've had about 6 pints" (12/31/2011)  . Pneumonia   . Anemia   . GI bleed   . Coronary artery disease   . Hypertension   . Peripheral vascular disease     PAD  . Dysrhythmia     "skips"  . Anginal pain   . Myocardial infarction     "they said I had 2 light ones"  . Shortness of breath 12/31/2011    "all the time"  . Type II diabetes mellitus   . Arthritis     "plenty"  . Chronic lower back pain   . Renal insufficiency     "went on dialysis probably 3 times"  . Skin  cancer     "forehead; arms"  . Depression    Past Surgical History  Procedure Date  . Hand surgery 1970's    "chain saw accident; had to reattach fingers on left hand"  . Esophagogastroduodenoscopy 05/05/2011    Procedure: ESOPHAGOGASTRODUODENOSCOPY (EGD);  Surgeon: Louis Meckel, MD;  Location: [facility];  Service: Endoscopy;  Laterality: N/A;  . Flexible sigmoidoscopy 05/05/2011    Procedure: FLEXIBLE SIGMOIDOSCOPY;  Surgeon: Louis Meckel, MD;  Location: [facility];  Service: Endoscopy;  Laterality: N/A;  . Laparoscopic cholecystectomy ~ 2001  . Excisional hemorrhoidectomy 1950's  . Cataract extraction w/ intraocular lens  implant, bilateral   . Coronary artery bypass graft 1997    CABG X2  . Iliac artery stent 11/2011    left/H&P  . Renal artery stent 2010    left/H&P  . Iliac artery stent 11/2011    right  . Coronary angioplasty with stent placement 12/31/2011    "I have 15 stents; heart, kidney, aorta, legs"  . Colonoscopy w/ biopsies and polypectomy     "have had probably 5 cut out" (12/31/2011)   Family History  Problem Relation Age of Onset  . Lung cancer Brother   . Diabetes Mother   . Heart disease Sister   . Stomach cancer Maternal Grandfather   . Heart disease Father   . Heart disease Brother   . Arthritis Mother   . ALS Sister   . ALS      neice  . ALS      nephew   History   Social History  . Marital Status: Married    Spouse Name: N/A    Number of Children: 5  . Years of Education: N/A   Occupational History  . retired    Social History Main Topics  . Smoking status: Former Smoker -- 2.5 packs/day for 45 years    Types: Cigarettes    Quit date: 05/25/1990  . Smokeless tobacco: Former Neurosurgeon    Types: Chew    Quit date: 06/01/1990  . Alcohol Use: Yes     12/31/2011 "used to drink; last alcohol has been 30 years I imagine"  . Drug Use: No  . Sexually Active: Not Currently   Other Topics Concern  . Not  on file   Social History Narrative    Daily caffeine use    Review of Systems: Positive for dizziness and weakness. Denies headache, changes in vision, fever, chills, abd pain, CP, SOB, edema, dysuria, and changes in bowel habits.   Physical Exam: Blood pressure 132/44, pulse 62, temperature 97.9 F (36.6 C), temperature source Oral, resp. rate 20, height 5\' 8"  (1.727 m), weight 114.2 kg (251 lb 12.3 oz), SpO2 95.00%. BP 132/44  Pulse 62  Temp 97.9 F (36.6 C) (Oral)  Resp 20  Ht 5\' 8"  (1.727 m)  Wt 114.2 kg (251 lb 12.3 oz)  BMI 38.28 kg/m2  SpO2 95% General appearance: alert, cooperative, appears stated age and no distress Head: Normocephalic, without obvious abnormality, atraumatic Eyes: conjunctiva clear, PERRL Neck: no adenopathy, supple, symmetrical, trachea midline and thyroid not enlarged, symmetric, no tenderness/mass/nodules Lungs: diminished breath sounds bilaterally few exp wheezes Heart: RRR with 1-2/6 systolic murmur Abdomen: Soft, nondistended, bowel sounds normal, TTP in epigastric region Extremities: extremities normal, atraumatic, no cyanosis or edema Pulses: 2+ and symmetric Skin: Skin color, texture, turgor normal. No rashes or lesions Neurologic: Grossly normal  Lab results: Basic Metabolic Panel:  Lab 03/04/12 1610 03/03/12 1809  NA 140 134*  K 4.7 5.0  CL 100 97  CO2 24 22  GLUCOSE 110* 191*  BUN 151* 150*  CREATININE 3.47* 3.86*  CALCIUM 9.8 9.7  ALB -- --  PHOS -- --   Liver Function Tests:  Lab 03/04/12 0904 03/03/12 1809  AST 20 22  ALT 15 15  ALKPHOS 61 58  BILITOT 0.3 0.2*  PROT 7.2 6.9  ALBUMIN 4.1 3.9   No results found for this basename: LIPASE:3,AMYLASE:3 in the last 168 hours No results found for this basename: AMMONIA:3 in the last 168 hours INR: @resultsinr3 @ CBC:  Lab 03/04/12 0904 03/03/12 1809  WBC 10.3 8.2  NEUTROABS -- --  HGB 10.0* 8.1*  HCT 32.0* 25.7*  MCV 90.7 89.9  PLT 236 238   Blood Culture    Component Value Date/Time   SDES  URINE, CATHETERIZED 05/02/2011 1049   SPECREQUEST Normal 05/02/2011 1049   CULT  Value: STAPHYLOCOCCUS SPECIES (COAGULASE NEGATIVE) Note: RIFAMPIN AND GENTAMICIN SHOULD NOT BE USED AS SINGLE DRUGS FOR TREATMENT OF STAPH INFECTIONS. 05/02/2011 1049   REPTSTATUS 05/04/2011 FINAL 05/02/2011 1049    Cardiac Enzymes:  Lab 03/04/12 0909 03/04/12 0340 03/03/12 2128 03/03/12 1800  CKTOTAL -- -- -- --  CKMB -- -- -- --  CKMBINDEX -- -- -- --  TROPONINI <0.30 <0.30 <0.30 <0.30   CBG:  Lab 03/04/12 1220 03/04/12 0846 03/03/12 2225  GLUCAP 106* 109* 179*   Iron Studies: No results found for this basename: IRON,TIBC,TRANSFERRIN,FERRITIN in the last 72 hours  Micro Results: Recent Results (from the past 240 hour(s))  MRSA PCR SCREENING     Status: Abnormal   Collection Time   03/03/12  3:33 PM      Component Value Range Status Comment   MRSA by PCR POSITIVE (*) NEGATIVE Final    Studies/Results: Dg Chest 2 View  03/03/2012  *RADIOLOGY REPORT*  Clinical Data: Shortness of breath  CHEST - 2 VIEW  Comparison: 12/31/2011  Findings: The heart is again enlarged in size.  Postsurgical changes are again seen.  The lungs are clear bilaterally.  Mild interstitial changes are again noted.  IMPRESSION: No acute abnormality noted.  No significant change from prior exam.   Original Report Authenticated By: Phillips Odor, M.D.  Other results: EKG:  Assessment & Plan by Problem: Mr. Zorn is a 78 yo male with a PMH of CKD stage 3, previous GI bleeds, Gastric AVMs, GERD, diverticulosis, COPD, CAD with multiple stent placements, PVD, HTN (1992), anemia (baseline 11), MI, and DM2 (2007).   1. Acute Kidney Injury on CKD stage 3- possibly medication related with ACE or poor PO intake and GI bleed. Pts baseline Cr. Have run between 1.7-3 over the last year. Cr is currently trending down. UA WNL, Renal ultrasound pending. Continue to monitor.  - Renal labs am  This is a Psychologist, occupational Note.  The care of the  patient was discussed with Dr. Briant Cedar and the assessment and plan was formulated with their assistance.  Please see their note for official documentation of the patient encounter.   Signed: V. [place], [place], PA-S2 03/04/2012, 12:58 PM  Pt seen and examined and note reviewed.  Acute on CKD due to GI bleed on ACE.  Scr trending down and UO good.  UA nl.  Suspect renal fx will cont to improve.  BUN so high related to gi bleed.  Hold ace and get daily labs

## 2012-03-04 NOTE — Progress Notes (Signed)
[name] is a 78 y.o. male patient who transferred  From 2600 awake, alert  & orientated  X 3, Full Code, VSS - Blood pressure 171/54, pulse 71, temperature 97.6 F (36.4 C), temperature source Oral, resp. rate 20, height 5\' 8"  (1.727 m), weight 114.2 kg (251 lb 12.3 oz), SpO2 95.00%.,, no c/o shortness of breath, no c/o chest pain, no distress noted. No tele   IV site WDL: hand left, condition patent and no redness with a transparent dsg that's clean dry and intact.  Allergies:   Allergies  Allergen Reactions  . Donepezil Diarrhea  . Heparin Itching and Rash  . Hydralazine Other (See Comments)    DO NOT TAKE PER MD  . Hydromorphone Itching  . Morphine Itching  . Plavix (Clopidogrel Bisulfate) Other (See Comments)    GI BLEEDING; "so bad I had to take blood; dr took me off it"  . Promethazine Other (See Comments)    DO NOT TAKE PER MD  . Sulfonamide Derivatives Itching  . Zaroxolyn (Metolazone) Diarrhea  . Cefuroxime Other (See Comments)    REACTION: unkown reaction  . Metoprolol Other (See Comments)    unknown  . Ertapenem Itching and Rash    12/31/2011 pt does not recall allergy or severity     Past Medical History  Diagnosis Date  . Adenomatous colon polyp   . Diverticulosis   . GERD (gastroesophageal reflux disease)   . Hiatal hernia   . Alzheimer disease   . Gout     "hands; backbone; elbows; shoulders"  . Asthma with bronchitis   . COPD (chronic obstructive pulmonary disease)   . Chronic hypoxemic respiratory failure   . OSA (obstructive sleep apnea)   . CHF (congestive heart failure)   . Blood transfusion     "I've had about 6 pints" (12/31/2011)  . Pneumonia   . Anemia   . GI bleed   . Coronary artery disease   . Hypertension   . Peripheral vascular disease     PAD  . Dysrhythmia     "skips"  . Anginal pain   . Myocardial infarction     "they said I had 2 light ones"  . Shortness of breath 12/31/2011    "all the time"  . Type II diabetes mellitus     . Arthritis     "plenty"  . Chronic lower back pain   . Renal insufficiency     "went on dialysis probably 3 times"  . Skin cancer     "forehead; arms"  . Depression     Pt orientation to unit, room and routine. SR up x 2, fall risk assessment complete with Patient and family verbalizing understanding of risks associated with falls. Pt verbalizes an understanding of how to use the call bell and to call for help before getting out of bed.  Skin, clean-dry- intact without evidence of bruising, or skin tears.   No evidence of skin break down noted on exam.     Will cont to monitor and assist as needed.  Cindra Eves, RN 03/04/2012 6:28 PM

## 2012-03-04 NOTE — Interval H&P Note (Signed)
History and Physical Interval Note:  03/04/2012 10:19 AM  [name]  has presented today for surgery, with the diagnosis of GIB; history of AVM's  The various methods of treatment have been discussed with the patient and family. After consideration of risks, benefits and other options for treatment, the patient has consented to  Procedure(s) (LRB) with comments: ENTEROSCOPY (N/A) - jessica/leone as a surgical intervention .  The patient's history has been reviewed, patient examined, no change in status, stable for surgery.  I have reviewed the patient's chart and labs.  Questions were answered to the patient's satisfaction.    The recent H&P (dated *03/04/12**) was reviewed, the patient was examined and there is no change in the patients condition since that H&P was completed.   [name]  03/04/2012, 10:19 AM    [name]

## 2012-03-05 DIAGNOSIS — E875 Hyperkalemia: Secondary | ICD-10-CM | POA: Diagnosis present

## 2012-03-05 DIAGNOSIS — I519 Heart disease, unspecified: Secondary | ICD-10-CM

## 2012-03-05 LAB — CBC
HCT: 30.3 % — ABNORMAL LOW (ref 39.0–52.0)
Hemoglobin: 9.4 g/dL — ABNORMAL LOW (ref 13.0–17.0)
MCHC: 31 g/dL (ref 30.0–36.0)
MCHC: 31.5 g/dL (ref 30.0–36.0)
Platelets: 235 10*3/uL (ref 150–400)
RDW: 19.4 % — ABNORMAL HIGH (ref 11.5–15.5)
RDW: 19.3 % — ABNORMAL HIGH (ref 11.5–15.5)
WBC: 13 10*3/uL — ABNORMAL HIGH (ref 4.0–10.5)

## 2012-03-05 LAB — TROPONIN I
Troponin I: <0.3 ng/mL (ref <0.30)
Troponin I: <0.3 ng/mL (ref <0.30)
Troponin I: <0.3 ng/mL (ref <0.30)

## 2012-03-05 LAB — GLUCOSE, CAPILLARY
Glucose-Capillary: 106 mg/dL — ABNORMAL HIGH (ref 70–99)
Glucose-Capillary: 115 mg/dL — ABNORMAL HIGH (ref 70–99)
Glucose-Capillary: 196 mg/dL — ABNORMAL HIGH (ref 70–99)

## 2012-03-05 LAB — RENAL FUNCTION PANEL
Albumin: 3.9 g/dL (ref 3.5–5.2)
BUN: 146 mg/dL — ABNORMAL HIGH (ref 6–23)
Chloride: 106 mEq/L (ref 96–112)
GFR calc Af Amer: 19 mL/min — ABNORMAL LOW (ref >90)
GFR calc non Af Amer: 17 mL/min — ABNORMAL LOW (ref >90)
Potassium: 5.2 mEq/L — ABNORMAL HIGH (ref 3.5–5.1)

## 2012-03-05 MED ORDER — PANTOPRAZOLE SODIUM 40 MG PO TBEC
40.0000 mg | DELAYED_RELEASE_TABLET | Freq: Every day | ORAL | Status: DC
  Administered 2012-03-05 – 2012-03-06 (×2): 40 mg via ORAL
  Filled 2012-03-05: qty 1

## 2012-03-05 MED ORDER — ONDANSETRON HCL 4 MG/2ML IJ SOLN
4.0000 mg | Freq: Once | INTRAMUSCULAR | Status: AC
  Administered 2012-03-05: 4 mg via INTRAVENOUS

## 2012-03-05 MED ORDER — SODIUM POLYSTYRENE SULFONATE 15 GM/60ML PO SUSP
15.0000 g | Freq: Once | ORAL | Status: AC
  Administered 2012-03-05: 15 g via ORAL
  Filled 2012-03-05: qty 60

## 2012-03-05 NOTE — Progress Notes (Signed)
S: Appetite poor.  Worn out fron BM this AM O:BP 147/51  Pulse 80  Temp 99.3 F (37.4 C) (Oral)  Resp 20  Ht 5\' 8"  (1.727 m)  Wt 114.2 kg (251 lb 12.3 oz)  BMI 38.28 kg/m2  SpO2 94%  Intake/Output Summary (Last 24 hours) at 03/05/12 0700 Last data filed at 03/04/12 1900  Gross per 24 hour  Intake    250 ml  Output      0 ml  Net    250 ml   Weight change:  ZOX:WRUEA and alert CVS:rrr 2/6 systolic murmur Resp:decreased BS throughout.  No wheezing Abd:+ BS NT ND Ext: no edema NEURO:CNI OX3  ? Mild asterixis      . amLODipine  10 mg Oral Daily  . Chlorhexidine Gluconate Cloth  6 each Topical Q0600  . cloNIDine HCl  0.1 mg Oral BID  . diclofenac  1 drop Both Eyes BID  . insulin aspart  0-15 Units Subcutaneous TID WC  . insulin glargine  25 Units Subcutaneous QHS  . isosorbide mononitrate  60 mg Oral Daily  . levalbuterol  0.63 mg Nebulization Q6H  . mupirocin ointment  1 application Nasal BID  . pantoprazole (PROTONIX) IV  40 mg Intravenous Q12H  . sodium chloride  3 mL Intravenous Q12H  . tiotropium  18 mcg Inhalation Daily  . traMADol  50 mg Oral Once  . DISCONTD: insulin glargine  53 Units Subcutaneous Daily  . DISCONTD: insulin glargine  56 Units Subcutaneous QHS   Dg Chest 2 View  03/03/2012  *RADIOLOGY REPORT*  Clinical Data: Shortness of breath  CHEST - 2 VIEW  Comparison: 12/31/2011  Findings: The heart is again enlarged in size.  Postsurgical changes are again seen.  The lungs are clear bilaterally.  Mild interstitial changes are again noted.  IMPRESSION: No acute abnormality noted.  No significant change from prior exam.   Original Report Authenticated By: Phillips Odor, M.D.    US Renal  03/04/2012  *RADIOLOGY REPORT*  Clinical Data:  Acute renal failure on chronic renal insufficiency. Hypertension. Diabetes.  RENAL/URINARY TRACT ULTRASOUND COMPLETE  Comparison:  01/11/2010  Findings:  Right Kidney:  Measures 10.8 cm length.  Mildly increased renal  parenchymal echogenicity.  No evidence of renal masses.  No evidence of hydronephrosis.  Left Kidney:  Measures 11.1 cm length.  Mildly increased renal parenchymal echogenicity.  No evidence of renal masses.  No evidence of hydronephrosis.  A small subcapsular cyst noted in the lower pole measuring 2.0 cm in maximum diameter.  Bladder:  Appears normal for degree of bladder distention.  IMPRESSION:  1.  Mildly increased renal parenchymal echogenicity, consistent with medical renal disease. 2.  No evidence of hydronephrosis.   Original Report Authenticated By: Danae Orleans, M.D.    BMET    Component Value Date/Time   NA 142 03/05/2012 0457   K 5.2* 03/05/2012 0457   CL 106 03/05/2012 0457   CO2 21 03/05/2012 0457   GLUCOSE 97 03/05/2012 0457   BUN 146* 03/05/2012 0457   CREATININE 3.34* 03/05/2012 0457   CALCIUM 10.0 03/05/2012 0457   CALCIUM 7.9* 08/09/2008 0350   GFRNONAA 17* 03/05/2012 0457   GFRAA 19* 03/05/2012 0457   CBC    Component Value Date/Time   WBC 11.5* 03/04/2012 2340   RBC 3.32* 03/04/2012 2340   HGB 9.4* 03/04/2012 2340   HCT 30.3* 03/04/2012 2340   PLT 232 03/04/2012 2340   MCV 91.3 03/04/2012 2340  MCH 28.3 03/04/2012 2340   MCHC 31.0 03/04/2012 2340   RDW 19.3* 03/04/2012 2340   LYMPHSABS 1.4 12/31/2011 1217   MONOABS 1.0 12/31/2011 1217   EOSABS 0.5 12/31/2011 1217   BASOSABS 0.0 12/31/2011 1217     Assessment: 1. Acute on CKD3, improving 2. GI bleed from gastric AVM 3. DM 4. COPD 5. Mild hyperkalemia Plan: 1.  Renal fx should cont to improve 2. 15 gm kayexalate 3. His poor appetite is from the high BUN which is predominantly result of GI bleed   Walton Digilio T

## 2012-03-05 NOTE — Progress Notes (Signed)
[facility] Progress Note  Subjective: Appetite poor, tired, nauseated, had dark bowel movements last night  Objective:  Vital signs in last 24 hours: Temp:  [97.6 F (36.4 C)-99.3 F (37.4 C)] 99.3 F (37.4 C) (10/12 0536) Pulse Rate:  [62-91] 80  (10/12 0536) Resp:  [11-27] 20  (10/12 0536) BP: (116-171)/(39-67) 147/51 mmHg (10/12 0536) SpO2:  [90 %-100 %] 95 % (10/12 0925) Last BM Date: 03/04/12 General:   Alert, well-developed, elderly white male in NAD Heart:  Regular rate and rhythm; no murmurs Abdomen:  Soft, mild epigastric and LUQ tenderness and nondistended. Normal bowel sounds, without guarding, and without rebound.   Extremities:  Without edema. Neurologic:  Alert and  oriented x4;  grossly normal neurologically. Psych:  Alert and cooperative. Normal mood and affect.  Intake/Output from previous day: 10/11 0701 - 10/12 0700 In: 250 [P.O.:200; I.V.:40; IV Piggyback:10] Out: -  Intake/Output this shift:    Lab Results:  Basename 03/04/12 2340 03/04/12 0904 03/03/12 1809  WBC 11.5* 10.3 8.2  HGB 9.4* 10.0* 8.1*  HCT 30.3* 32.0* 25.7*  PLT 232 236 238   BMET  Basename 03/05/12 0457 03/04/12 0904 03/03/12 1809  NA 142 140 134*  K 5.2* 4.7 5.0  CL 106 100 97  CO2 21 24 22   GLUCOSE 97 110* 191*  BUN 146* 151* 150*  CREATININE 3.34* 3.47* 3.86*  CALCIUM 10.0 9.8 9.7   LFT  Basename 03/05/12 0457 03/04/12 0904  PROT -- 7.2  ALBUMIN 3.9 --  AST -- 20  ALT -- 15  ALKPHOS -- 61  BILITOT -- 0.3  BILIDIR -- --  IBILI -- --   PT/INR  Basename 03/04/12 0340  LABPROT 14.3  INR 1.13   Studies/Results: Dg Chest 2 View  03/03/2012  *RADIOLOGY REPORT*  Clinical Data: Shortness of breath  CHEST - 2 VIEW  Comparison: 12/31/2011  Findings: The heart is again enlarged in size.  Postsurgical changes are again seen.  The lungs are clear bilaterally.  Mild interstitial changes are again noted.  IMPRESSION: No acute abnormality noted.  No significant  change from prior exam.   Original Report Authenticated By: Phillips Odor, M.D.    US Renal  03/04/2012  *RADIOLOGY REPORT*  Clinical Data:  Acute renal failure on chronic renal insufficiency. Hypertension. Diabetes.  RENAL/URINARY TRACT ULTRASOUND COMPLETE  Comparison:  01/11/2010  Findings:  Right Kidney:  Measures 10.8 cm length.  Mildly increased renal parenchymal echogenicity.  No evidence of renal masses.  No evidence of hydronephrosis.  Left Kidney:  Measures 11.1 cm length.  Mildly increased renal parenchymal echogenicity.  No evidence of renal masses.  No evidence of hydronephrosis.  A small subcapsular cyst noted in the lower pole measuring 2.0 cm in maximum diameter.  Bladder:  Appears normal for degree of bladder distention.  IMPRESSION:  1.  Mildly increased renal parenchymal echogenicity, consistent with medical renal disease. 2.  No evidence of hydronephrosis.   Original Report Authenticated By: Danae Orleans, M.D.    Assessment / Recommendations:  1. 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 Hb appears stable. Zofran for nausea and continue clear liquids for now.     followup office visit with Dr. Yancey Flemings 3-4 weeks      discharge on PPI and Fe replacement therapy when he has improved      OK to continue ASA as indicated   Principal Problem:  *GI BLEED, 12/12- gastric AVM ablated Active Problems:  DM  OSA- on c-pap  PVD, widespread, ISR bilat Iliac stents, re stented 12/11/11  S/P CABG x 3 '97, PCI '09, cath 4/11- medical Rx  HTN (hypertension)  Obesity  Chronic renal insufficiency, stage III (moderate) - Baseline creatinine roughly 1.6  Diastolic dysfunction, - grade 2 by echo 01/01/12  Melena  Acute posthemorrhagic anemia  Acute renal failure  Gastric and duodenal angiodysplasia with hemorrhage   LOS: 2 days   Judie Petit T. Russella Dar MD [facility] 03/05/2012, 9:47 AM

## 2012-03-05 NOTE — Progress Notes (Signed)
[facility] Progress Note [place] TEAM 1 - Stepdown/ICU TEAM   [name] JYN:[id number] DOB: April 10, 1934 DOA: 03/03/2012 PCP: Kaleen Mask, MD  Brief narrative: 78 year old male patient with multiple medical problems including and gastric AVMs diverticulosis and and small bowel erosions. Known baseline hemoglobin back in August 2013 around 11. Patient apparently had developed progressive shortness of breath with melena and a repeat hemoglobin prior to admission was 8.4. Patient apparently started on iron supplementation 3 weeks previous. Patient endorsed dizziness and lightheadedness for 3 weeks with significant increasing shortness of breath. He endorsed a  nonproductive cough with wheezing, dyspnea on exertion but denied chest pain or other constitutional type symptoms. In reviewing his medications it is noted he was taking aspirin and prednisone and he is also on colchicine. Patient was admitted to the step down unit for further monitoring and treatment. Please note that some lab work was pending at time of admission including patient's electrolyte panel.  Assessment/Plan: Principal Problem: GI BLEED (12/12- gastric AVM ablated)/ Melena -No apparent further melena since admission -Appreciate gastroenterology assistance-EGD revealed bleeding angio ectasia in gastric fundus s/p argon gas coagulation - Recommend follow up EGD in 2-3 weeks -Was on aspirin and prednisone prior to admission as well as protonix  Active Problems:  Acute posthemorrhagic anemia -Hemoglobin 8.1 at admission and has increased to 10 after one unit of packed red blood cells -Continue to monitor CBC every 12 hours - Suspect renal failure and low epo levels contributing  Hyperkalemia: -Kayexalate given a basic metabolic panel in the morning. -This most likely secondary to upper GI bleed and acute renal failure. His creatinine continues to improve.   Acute renal failure on Chronic renal  insufficiency, stage III (moderate) - Baseline creatinine1.6 -BUN of 150 where previously had been 40 and a creatinine of 3.86. Repeat electrolyte panel this morning demonstrates similar findings BUN 151 and creatinine 3.47, most likely secondary to upper GI bleed, as a BUN to creatinine ratio is greater than 40-1. -FENa calculated at 0.67% which is suggestive of prerenal etiology. Urine sodium is 32.  -And continues to improve.   Diabetes Mellitus Controlled -Hemoglobin A1c 6.4 -Continue Lantus and sliding scale insulin-at home was on very high BID dosing- CBG's here have been in the 100's while NPO so will dc AM dose and decrease PM dose from 57 to 25 and follow   PVD, widespread, ISR bilat Iliac stents, re stented 12/11/11 -No apparent claudication symptoms prior to admission   Diastolic dysfunction, - grade 2 by echo 01/01/12 -Appears to be compensated *Was on Bumex, ACE inhibitor, and Zaroxolyn at home   OSA- on c-pap *Stable     HTN (hypertension) -ACE inhibitors and blood pressure medication remain on hold except for him to her.  DVT prophylaxis: SCDs Code Status: Full Family Communication: Spoke with patient Disposition Plan: Transfer to floor  Consultants: Nephrology Gastroenterology  Procedures: EGD and colonoscopy for 03/04/2012 by Dr. Randa Evens  Antibiotics: None  HPI/Subjective:  nauseated  Objective: Blood pressure 133/49, pulse 80, temperature 99.3 F (37.4 C), temperature source Oral, resp. rate 20, height 5\' 8"  (1.727 m), weight 114.2 kg (251 lb 12.3 oz), SpO2 95.00%.  Intake/Output Summary (Last 24 hours) at 03/05/12 1019 Last data filed at 03/04/12 1900  Gross per 24 hour  Intake    210 ml  Output      0 ml  Net    210 ml     Exam: General: No acute respiratory distress Lungs: Clear to auscultation  bilaterally without wheezes or crackles, 2 L nasal cannula oxygen Cardiovascular: Regular rate and rhythm without murmur gallop or rub normal S1 and  S2, chronic bilateral lower extremity edema 1+ Abdomen: Nontender, nondistended, soft, bowel sounds positive, no rebound, no ascites, no appreciable mass Musculoskeletal: No significant cyanosis, clubbing of bilateral lower extremities   Data Reviewed: Basic Metabolic Panel:  Lab 03/05/12 7829 03/04/12 0904 03/03/12 1809  NA 142 140 134*  K 5.2* 4.7 5.0  CL 106 100 97  CO2 21 24 22   GLUCOSE 97 110* 191*  BUN 146* 151* 150*  CREATININE 3.34* 3.47* 3.86*  CALCIUM 10.0 9.8 9.7  MG -- -- --  PHOS 5.0* -- --   Liver Function Tests:  Lab 03/05/12 0457 03/04/12 0904 03/03/12 1809  AST -- 20 22  ALT -- 15 15  ALKPHOS -- 61 58  BILITOT -- 0.3 0.2*  PROT -- 7.2 6.9  ALBUMIN 3.9 4.1 3.9   No results found for this basename: LIPASE:5,AMYLASE:5 in the last 168 hours No results found for this basename: AMMONIA:5 in the last 168 hours CBC:  Lab 03/05/12 0939 03/04/12 2340 03/04/12 0904 03/03/12 1809  WBC 13.0* 11.5* 10.3 8.2  NEUTROABS -- -- -- --  HGB 9.4* 9.4* 10.0* 8.1*  HCT 30.8* 30.3* 32.0* 25.7*  MCV 92.2 91.3 90.7 89.9  PLT 235 232 236 238   Cardiac Enzymes:  Lab 03/05/12 0456 03/04/12 2340 03/04/12 1645 03/04/12 0909 03/04/12 0340  CKTOTAL -- -- -- -- --  CKMB -- -- -- -- --  CKMBINDEX -- -- -- -- --  TROPONINI <0.30 <0.30 <0.30 <0.30 <0.30   BNP (last 3 results)  Basename 03/03/12 2128 01/02/12 1130 01/02/12 0550  PROBNP 509.1* 945.2* 1007.0*   CBG:  Lab 03/05/12 0816 03/05/12 0054 03/04/12 1746 03/04/12 1220 03/04/12 0846  GLUCAP 115* 106* 117* 106* 109*    Recent Results (from the past 240 hour(s))  MRSA PCR SCREENING     Status: Abnormal   Collection Time   03/03/12  3:33 PM      Component Value Range Status Comment   MRSA by PCR POSITIVE (*) NEGATIVE Final      Studies:  Recent x-ray studies have been reviewed in detail by the Attending Physician  Scheduled Meds:  Reviewed in detail by the Attending Physician    If 7PM-7AM, please contact  night-coverage [email or web address] Password [email or web address] 03/05/2012, 10:19 AM   LOS: 2 days

## 2012-03-05 NOTE — Plan of Care (Signed)
Placed patient on CPAP at 13cm with oxygen at 3lpm.Will continue to monitor.

## 2012-03-06 DIAGNOSIS — E119 Type 2 diabetes mellitus without complications: Secondary | ICD-10-CM

## 2012-03-06 DIAGNOSIS — N183 Chronic kidney disease, stage 3 unspecified: Secondary | ICD-10-CM

## 2012-03-06 DIAGNOSIS — N179 Acute kidney failure, unspecified: Secondary | ICD-10-CM

## 2012-03-06 LAB — RENAL FUNCTION PANEL
BUN: 142 mg/dL — ABNORMAL HIGH (ref 6–23)
CO2: 23 mEq/L (ref 19–32)
Chloride: 104 mEq/L (ref 96–112)
Creatinine, Ser: 3.38 mg/dL — ABNORMAL HIGH (ref 0.50–1.35)
GFR calc non Af Amer: 16 mL/min — ABNORMAL LOW (ref >90)
Potassium: 5 mEq/L (ref 3.5–5.1)

## 2012-03-06 LAB — CBC
Hemoglobin: 9.2 g/dL — ABNORMAL LOW (ref 13.0–17.0)
MCV: 92.8 fL (ref 78.0–100.0)
Platelets: 212 10*3/uL (ref 150–400)
RBC: 3.32 MIL/uL — ABNORMAL LOW (ref 4.22–5.81)
WBC: 10.8 10*3/uL — ABNORMAL HIGH (ref 4.0–10.5)

## 2012-03-06 LAB — TROPONIN I
Troponin I: <0.3 ng/mL (ref <0.30)
Troponin I: <0.3 ng/mL (ref <0.30)

## 2012-03-06 LAB — GLUCOSE, CAPILLARY: Glucose-Capillary: 154 mg/dL — ABNORMAL HIGH (ref 70–99)

## 2012-03-06 MED ORDER — LEVALBUTEROL HCL 0.63 MG/3ML IN NEBU
0.6300 mg | INHALATION_SOLUTION | Freq: Three times a day (TID) | RESPIRATORY_TRACT | Status: DC
  Administered 2012-03-06: 0.63 mg via RESPIRATORY_TRACT
  Filled 2012-03-06 (×4): qty 3

## 2012-03-06 MED ORDER — LISINOPRIL 5 MG PO TABS: 5.0000 mg | ORAL_TABLET | Freq: Every day | ORAL | Status: DC

## 2012-03-06 NOTE — Progress Notes (Signed)
S: Feels better.  Has not been getting up much.  Says he was having problems ambulating PTA due to chronic Rt knee pain O:BP 146/125  Pulse 70  Temp 98.9 F (37.2 C) (Axillary)  Resp 18  Ht 5\' 8"  (1.727 m)  Wt 114.2 kg (251 lb 12.3 oz)  BMI 38.28 kg/m2  SpO2 97%  Intake/Output Summary (Last 24 hours) at 03/06/12 0927 Last data filed at 03/06/12 0700  Gross per 24 hour  Intake    480 ml  Output    800 ml  Net   -320 ml   Weight change:  ZOX:WRUEA and alert CVS:rrr 2/6 systolic murmur Resp:decreased BS throughout.  No wheezing Abd:+ BS NT ND Ext: no edema  Rt knee-no effusion, no to minimal pain with flexion and extension NEURO:CNI OX3  ? Mild asterixis      . amLODipine  10 mg Oral Daily  . Chlorhexidine Gluconate Cloth  6 each Topical Q0600  . cloNIDine HCl  0.1 mg Oral BID  . diclofenac  1 drop Both Eyes BID  . insulin aspart  0-15 Units Subcutaneous TID WC  . insulin glargine  25 Units Subcutaneous QHS  . isosorbide mononitrate  60 mg Oral Daily  . levalbuterol  0.63 mg Nebulization TID  . mupirocin ointment  1 application Nasal BID  . ondansetron  4 mg Intravenous Once  . pantoprazole  40 mg Oral Q1200  . sodium chloride  3 mL Intravenous Q12H  . tiotropium  18 mcg Inhalation Daily  . DISCONTD: levalbuterol  0.63 mg Nebulization Q6H  . DISCONTD: pantoprazole (PROTONIX) IV  40 mg Intravenous Q12H   US Renal  03/04/2012  *RADIOLOGY REPORT*  Clinical Data:  Acute renal failure on chronic renal insufficiency. Hypertension. Diabetes.  RENAL/URINARY TRACT ULTRASOUND COMPLETE  Comparison:  01/11/2010  Findings:  Right Kidney:  Measures 10.8 cm length.  Mildly increased renal parenchymal echogenicity.  No evidence of renal masses.  No evidence of hydronephrosis.  Left Kidney:  Measures 11.1 cm length.  Mildly increased renal parenchymal echogenicity.  No evidence of renal masses.  No evidence of hydronephrosis.  A small subcapsular cyst noted in the lower pole measuring 2.0  cm in maximum diameter.  Bladder:  Appears normal for degree of bladder distention.  IMPRESSION:  1.  Mildly increased renal parenchymal echogenicity, consistent with medical renal disease. 2.  No evidence of hydronephrosis.   Original Report Authenticated By: Danae Orleans, M.D.    BMET    Component Value Date/Time   NA 138 03/06/2012 0251   K 5.0 03/06/2012 0251   CL 104 03/06/2012 0251   CO2 23 03/06/2012 0251   GLUCOSE 170* 03/06/2012 0251   BUN 142* 03/06/2012 0251   CREATININE 3.38* 03/06/2012 0251   CALCIUM 9.7 03/06/2012 0251   CALCIUM 7.9* 08/09/2008 0350   GFRNONAA 16* 03/06/2012 0251   GFRAA 19* 03/06/2012 0251   CBC    Component Value Date/Time   WBC 11.8* 03/05/2012 1915   RBC 3.09* 03/05/2012 1915   HGB 9.0* 03/05/2012 1915   HCT 28.6* 03/05/2012 1915   PLT 201 03/05/2012 1915   MCV 92.6 03/05/2012 1915   MCH 29.1 03/05/2012 1915   MCHC 31.5 03/05/2012 1915   RDW 19.3* 03/05/2012 1915   LYMPHSABS 1.4 12/31/2011 1217   MONOABS 1.0 12/31/2011 1217   EOSABS 0.5 12/31/2011 1217   BASOSABS 0.0 12/31/2011 1217     Assessment: 1. Acute on CKD3, improving 2. GI bleed from gastric  AVM 3. DM 4. COPD 5. Mild hyperkalemia, improved Plan: 1.  Renal fx stable but he needs to get up and ambulate before DC. 2. He needs to resume FU with Dr Caryn Section at DC 3. Recheck labs in am if still here  Amayah Staheli T

## 2012-03-06 NOTE — Progress Notes (Signed)
Pt. discharge to floor,verbalized understanding of discharged instruction,medication,restriction,diet and follow up appointment.Baseline Vitals sign stable,Pt comfortable,no sign and symptom of distress. 

## 2012-03-06 NOTE — Progress Notes (Signed)
Patient ID: [name], male   DOB: Sep 10, 1933, 78 y.o.   MRN: [id number]  [facility] Progress Note  Subjective: In good spirits-says feeling better-no abdominal pain today,and able to eat solid food No melena. HGB pending  Objective:  Vital signs in last 24 hours: Temp:  [98.4 F (36.9 C)-98.9 F (37.2 C)] 98.9 F (37.2 C) (10/13 0700) Pulse Rate:  [70-79] 70  (10/13 0700) Resp:  [18] 18  (10/13 0700) BP: (111-146)/(46-125) 146/125 mmHg (10/13 0918) SpO2:  [91 %-97 %] 97 % (10/13 0943) Last BM Date: 03/04/12 General:   Alert,  Well-developed, elderly in NAD Heart:  Regular rate and rhythm; systolic murmur Pulm; clear Abdomen:  Soft, nontender and nondistended. Normal bowel sounds, without guarding Extremities:  Without edema. Neurologic:  Alert and  oriented x4;  grossly normal neurologically. Psych:  Alert and cooperative. Normal mood and affect.  Intake/Output from previous day: 10/12 0701 - 10/13 0700 In: 720 [P.O.:720] Out: 1300 [Urine:1300] Intake/Output this shift:    Lab Results:  Basename 03/05/12 1915 03/05/12 0939 03/04/12 2340  WBC 11.8* 13.0* 11.5*  HGB 9.0* 9.4* 9.4*  HCT 28.6* 30.8* 30.3*  PLT 201 235 232   BMET  Basename 03/06/12 0251 03/05/12 0457 03/04/12 0904  NA 138 142 140  K 5.0 5.2* 4.7  CL 104 106 100  CO2 23 21 24   GLUCOSE 170* 97 110*  BUN 142* 146* 151*  CREATININE 3.38* 3.34* 3.47*  CALCIUM 9.7 10.0 9.8   LFT  Basename 03/06/12 0251 03/04/12 0904  PROT -- 7.2  ALBUMIN 3.5 --  AST -- 20  ALT -- 15  ALKPHOS -- 61  BILITOT -- 0.3  BILIDIR -- --  IBILI -- --   PT/INR  Basename 03/04/12 0340  LABPROT 14.3  INR 1.13    Assessment / Plan: 78 yo male with GAVE syndrome -s/p APC of stomach on 10/11 - better today, abdominal discomfort resolved, no active bleeding  Will need to resume his ASA Check Hgb today Continue protonix once daily Oral FE for home Outpatient follow up with Dr. 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 I agree with the extender's note, impression and recommendations. Stable post APC therapy of GAVE.   Venita Lick. Russella Dar MD [facility]

## 2012-03-06 NOTE — Discharge Summary (Signed)
Physician Discharge Summary  [name] UYQ:[id number] DOB: 28-Aug-1933 DOA: 03/03/2012  PCP: Kaleen Mask, MD  Admit date: 03/03/2012 Discharge date: 03/06/2012  Recommendations for Outpatient Follow-up:  1. Follow up with his primary care doctor in 2 weeks here will check a CBC and a basic metabolic panel to followup on his hemoglobin and creatinine  2. Is to followup with Dr. Marina Goodell as an outpatient 3-4 weeks.  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, PCI '09, cath 4/11- medical Rx  HTN (hypertension)  Obesity  Chronic renal insufficiency, stage III (moderate) - Baseline creatinine roughly 1.6  Diastolic dysfunction, - grade 2 by echo 01/01/12  Melena  Acute posthemorrhagic anemia  Acute renal failure  Gastric and duodenal angiodysplasia with hemorrhage  Hyperkalemia   Discharge Condition: Stable  Diet recommendation: Modified diet  Filed Weights   03/03/12 1529 03/04/12 0357  Weight: 110.2 kg (242 lb 15.2 oz) 114.2 kg (251 lb 12.3 oz)    History of present illness:  78 year old male with a former smoker, with a history of sleep apnea, diabetes, history of chronic anemia, extensive workup by Dr. Marina Goodell, revealing gastric AVM, diverticulosis, small bone erosions, apparently with a hemoglobin of 11, 8-10 weeks ago, who was sent to the hospital for further evaluation of his anemia as well as shortness of breath. The patient was noted to have a hemoglobin of 8.4 , 3 weeks ago. Dr. Jeannetta Nap started him on oral iron. He was asked to return in about 3 weeks for a followup. His hemoglobin is lower. The patient states that he has been dizzy and lightheaded for the last 3 weeks. An extremely short of breath. He has had some nonproductive cough as well as wheezing, dyspnea on exertion, denies any chest pain fever chills  rigors   Hospital Course:  GI BLEED (12/12- gastric AVM ablated)/ Melena  -Appreciate gastroenterology assistance-EGD revealed bleeding angio ectasia in gastric fundus s/p argon gas coagulation  -Recommend follow up EGD in 2-3 weeks  -Was on aspirin and prednisone prior to admission as well as protonix , okay to resume per GI.  Acute posthemorrhagic anemia  -Hemoglobin 8.1 at admission and has increased to 10 after one unit of packed red blood cells  - Suspect renal failure and low epo levels contributing   Acute renal failure on Chronic renal insufficiency, stage III (moderate) - Baseline creatinine1.6  -After admission patient's electrolyte panel was resulted and showed significant worsening of renal function with a BUN of 150 where previously had been 40 and a creatinine of 3.86. Repeat electrolyte shows a creatinine is trending down slowly. After hydration. -Patient was on ACE inhibitors diuretics as well as colchicine prior to admission all which could contribute to medication related ATN. They were held and resume in 1 week. -Nephrology has been consulted. -We will check a renal ultrasound-last was checked 2011 and showed no medical renal disease. -Patient also has a history of renal artery stenting.  Diabetes Mellitus Controlled  -no changes made continue home meds.  PVD, widespread, ISR bilat Iliac stents, re stented 12/11/11  -No apparent claudication symptoms prior to admission.  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, PCI '09, cath 4/11- medical Rx  -Cardiac isoenzymes have been negative x4 since admission  -No indications to check an EKG at time of  admission  -Continue Imdur    Procedures:  EGD revealed bleeding angio ectasia in gastric fundus s/p argon gas coagulation   Consultations:  GI  Discharge Exam: Filed Vitals:   03/06/12 0700 03/06/12 0918 03/06/12 0940 03/06/12 0943  BP: 123/46 146/125    Pulse: 70     Temp: 98.9 F (37.2 C)     TempSrc:  Axillary     Resp: 18     Height:      Weight:      SpO2: 97%  97% 97%    General: Awake alert and oriented x3 Cardiovascular: Irregular rate and rhythm Respiratory: Good air movement clear to auscultation  Discharge Instructions      Discharge Orders    Future Appointments: Provider: Department: Dept Phone: Center:   07/11/2012 9:30 AM Waymon Budge, MD [facility] [phone] None     Future Orders Please Complete By Expires   Diet - low sodium heart healthy      Increase activity slowly          Medication List     As of 03/06/2012 12:38 PM    TAKE these medications         acetaminophen 650 MG CR tablet   Commonly known as: TYLENOL   Take 1,300 mg by mouth 2 (two) times daily.      albuterol (2.5 MG/3ML) 0.083% nebulizer solution   Commonly known as: PROVENTIL   Take 2.5 mg by nebulization every 6 (six) hours as needed. For shortness of breath      albuterol 108 (90 BASE) MCG/ACT inhaler   Commonly known as: PROVENTIL HFA;VENTOLIN HFA   Inhale 2 puffs into the lungs every 4 (four) hours as needed for wheezing or shortness of breath.      allopurinol 300 MG tablet   Commonly known as: ZYLOPRIM   Take 300 mg by mouth 2 (two) times daily.      amLODipine 10 MG tablet   Commonly known as: NORVASC   Take 1 tablet (10 mg total) by mouth daily.      aspirin 325 MG tablet   Take 325 mg by mouth daily.      atorvastatin 20 MG tablet   Commonly known as: LIPITOR   Take 20 mg by mouth at bedtime.      bumetanide 2 MG tablet   Commonly known as: BUMEX   Take 1 tablet (2 mg total) by mouth 2 (two) times daily.      cloNIDine HCl 0.1 MG Tb12 ER tablet   Commonly known as: KAPVAY   Take 1 tablet (0.1 mg total) by mouth 2 (two) times daily.      colchicine 0.6 MG tablet   Take 0.6 mg by mouth daily.      escitalopram 10 MG tablet   Commonly known as: LEXAPRO   Take 20 mg by mouth daily.      ezetimibe 10 MG tablet   Commonly known as: ZETIA    Take 10 mg by mouth daily.      folic acid 400 MCG tablet   Commonly known as: FOLVITE   Take 400 mcg by mouth daily.      gabapentin 100 MG tablet   Commonly known as: NEURONTIN   Take 100 mg by mouth at bedtime.      insulin aspart 100 UNIT/ML injection   Commonly known as: novoLOG   Inject 10-20 Units into the skin 3 (three) times daily before meals. 10 units  in the morning, 20 units at noon and 20 units in the evening      isosorbide mononitrate 30 MG 24 hr tablet   Commonly known as: IMDUR   Take 60 mg by mouth daily.      LANTUS 100 UNIT/ML injection   Generic drug: insulin glargine   Inject 57 Units into the skin 2 (two) times daily.      lisinopril 5 MG tablet   Commonly known as: PRINIVIL,ZESTRIL   Take 1 tablet (5 mg total) by mouth daily.   Start taking on: 03/11/2012      metolazone 5 MG tablet   Commonly known as: ZAROXOLYN   Take 5 mg by mouth every other day. 30 minutes before Bumex dose      multivitamin with minerals Tabs   Take 0.5 tablets by mouth 2 (two) times daily.      nitroGLYCERIN 0.4 MG SL tablet   Commonly known as: NITROSTAT   Place 0.4 mg under the tongue every 5 (five) minutes as needed. May repeat x 3 for chest pain      pantoprazole 40 MG tablet   Commonly known as: PROTONIX   Take 40 mg by mouth daily.      predniSONE 5 MG tablet   Commonly known as: DELTASONE   Take 5 mg by mouth daily.      tiotropium 18 MCG inhalation capsule   Commonly known as: SPIRIVA   Place 18 mcg into inhaler and inhale daily.        Follow-up Information    Follow up with Kaleen Mask, MD. In 2 weeks.   Contact information:   [address] [place] [address] [phone]       Follow up with Yancey Flemings, MD. In 3 weeks. (hospital follow up)    Contact information:   [address] [place] [address] [phone]           The results of significant diagnostics from this hospitalization  (including imaging, microbiology, ancillary and laboratory) are listed below for reference.    Significant Diagnostic Studies: Dg Chest 2 View  03/03/2012  *RADIOLOGY REPORT*  Clinical Data: Shortness of breath  CHEST - 2 VIEW  Comparison: 12/31/2011  Findings: The heart is again enlarged in size.  Postsurgical changes are again seen.  The lungs are clear bilaterally.  Mild interstitial changes are again noted.  IMPRESSION: No acute abnormality noted.  No significant change from prior exam.   Original Report Authenticated By: Phillips Odor, M.D.    US Renal  03/04/2012  *RADIOLOGY REPORT*  Clinical Data:  Acute renal failure on chronic renal insufficiency. Hypertension. Diabetes.  RENAL/URINARY TRACT ULTRASOUND COMPLETE  Comparison:  01/11/2010  Findings:  Right Kidney:  Measures 10.8 cm length.  Mildly increased renal parenchymal echogenicity.  No evidence of renal masses.  No evidence of hydronephrosis.  Left Kidney:  Measures 11.1 cm length.  Mildly increased renal parenchymal echogenicity.  No evidence of renal masses.  No evidence of hydronephrosis.  A small subcapsular cyst noted in the lower pole measuring 2.0 cm in maximum diameter.  Bladder:  Appears normal for degree of bladder distention.  IMPRESSION:  1.  Mildly increased renal parenchymal echogenicity, consistent with medical renal disease. 2.  No evidence of hydronephrosis.   Original Report Authenticated By: Danae Orleans, M.D.     Microbiology: Recent Results (from the past 240 hour(s))  MRSA PCR SCREENING  Status: Abnormal   Collection Time   03/03/12  3:33 PM      Component Value Range Status Comment   MRSA by PCR POSITIVE (*) NEGATIVE Final      Labs: Basic Metabolic Panel:  Lab 03/06/12 1610 03/05/12 0457 03/04/12 0904 03/03/12 1809  NA 138 142 140 134*  K 5.0 5.2* 4.7 5.0  CL 104 106 100 97  CO2 23 21 24 22   GLUCOSE 170* 97 110* 191*  BUN 142* 146* 151* 150*  CREATININE 3.38* 3.34* 3.47* 3.86*  CALCIUM 9.7  10.0 9.8 9.7  MG -- -- -- --  PHOS 5.0* 5.0* -- --   Liver Function Tests:  Lab 03/06/12 0251 03/05/12 0457 03/04/12 0904 03/03/12 1809  AST -- -- 20 22  ALT -- -- 15 15  ALKPHOS -- -- 61 58  BILITOT -- -- 0.3 0.2*  PROT -- -- 7.2 6.9  ALBUMIN 3.5 3.9 4.1 3.9   No results found for this basename: LIPASE:5,AMYLASE:5 in the last 168 hours No results found for this basename: AMMONIA:5 in the last 168 hours CBC:  Lab 03/06/12 0850 03/05/12 1915 03/05/12 0939 03/04/12 2340 03/04/12 0904  WBC 10.8* 11.8* 13.0* 11.5* 10.3  NEUTROABS -- -- -- -- --  HGB 9.2* 9.0* 9.4* 9.4* 10.0*  HCT 30.8* 28.6* 30.8* 30.3* 32.0*  MCV 92.8 92.6 92.2 91.3 90.7  PLT 212 201 235 232 236   Cardiac Enzymes:  Lab 03/06/12 0850 03/06/12 0251 03/05/12 2317 03/05/12 1545 03/05/12 0939  CKTOTAL -- -- -- -- --  CKMB -- -- -- -- --  CKMBINDEX -- -- -- -- --  TROPONINI <0.30 <0.30 <0.30 <0.30 <0.30   BNP: BNP (last 3 results)  Basename 03/03/12 2128 01/02/12 1130 01/02/12 0550  PROBNP 509.1* 945.2* 1007.0*   CBG:  Lab 03/06/12 1211 03/06/12 0756 03/05/12 2235 03/05/12 1704 03/05/12 1203  GLUCAP 233* 154* 196* 170* 148*    Time coordinating discharge: *30 minutes  Signed:  Marinda Elk  [facility] 03/06/2012, 12:38 PM

## 2012-03-07 ENCOUNTER — Encounter (HOSPITAL_COMMUNITY): Payer: Self-pay | Admitting: Gastroenterology

## 2012-03-07 ENCOUNTER — Telehealth: Payer: Self-pay

## 2012-03-07 NOTE — Telephone Encounter (Signed)
Error

## 2012-03-07 NOTE — Telephone Encounter (Signed)
Spoke with pts wife and she is aware. Pt scheduled to see Dr. Marina Goodell 03/30/12@10 :45am.

## 2012-03-14 ENCOUNTER — Telehealth: Payer: Self-pay | Admitting: Internal Medicine

## 2012-03-14 NOTE — Telephone Encounter (Signed)
Ok to transiently hold. However, he's been on iron for some time now. Not clear why he would all of a sudden have problems with it.Marland KitchenMarland Kitchen

## 2012-03-14 NOTE — Telephone Encounter (Signed)
Spoke with husband and let him know Dr. Lamar Sprinkles recommendations. Pt will hold iron for a few days and try and resume taking it.

## 2012-03-14 NOTE — Telephone Encounter (Signed)
Pts wife states that the pt had nausea and vomiting over the weekend, she thinks the iron pills were causing it. He took his last dose Sunday afternoon. Pts wife wants to know if they can stay off of the iron for a few days and perhaps start taking it back at 1 pill per day and see how he feels. Dr. Marina Goodell please advise.

## 2012-03-17 ENCOUNTER — Telehealth: Payer: Self-pay | Admitting: Internal Medicine

## 2012-03-17 ENCOUNTER — Other Ambulatory Visit: Payer: Self-pay | Admitting: Internal Medicine

## 2012-03-17 DIAGNOSIS — D649 Anemia, unspecified: Secondary | ICD-10-CM

## 2012-03-17 NOTE — Telephone Encounter (Signed)
Pt wanted to know if pts PCP can draw the CBC prior to his OV with Dr. Marina Goodell. Called PCP office and faxed orders to Dr. Hennie Duos office. Pt to have CBC drawn with Dr. Hennie Duos the week of 03/21/12.

## 2012-03-30 ENCOUNTER — Ambulatory Visit (INDEPENDENT_AMBULATORY_CARE_PROVIDER_SITE_OTHER): Payer: BC Managed Care – PPO | Admitting: Internal Medicine

## 2012-03-30 ENCOUNTER — Other Ambulatory Visit (INDEPENDENT_AMBULATORY_CARE_PROVIDER_SITE_OTHER): Payer: BC Managed Care – PPO

## 2012-03-30 ENCOUNTER — Encounter: Payer: Self-pay | Admitting: Internal Medicine

## 2012-03-30 VITALS — BP 90/20 | HR 72 | Ht 67.0 in | Wt 236.0 lb

## 2012-03-30 DIAGNOSIS — Q2733 Arteriovenous malformation of digestive system vessel: Secondary | ICD-10-CM

## 2012-03-30 DIAGNOSIS — K31819 Angiodysplasia of stomach and duodenum without bleeding: Secondary | ICD-10-CM

## 2012-03-30 DIAGNOSIS — D649 Anemia, unspecified: Secondary | ICD-10-CM

## 2012-03-30 DIAGNOSIS — N189 Chronic kidney disease, unspecified: Secondary | ICD-10-CM

## 2012-03-30 LAB — CBC WITH DIFFERENTIAL/PLATELET
Basophils Absolute: 0 10*3/uL (ref 0.0–0.1)
Eosinophils Relative: 3.3 % (ref 0.0–5.0)
HCT: 28.2 % — ABNORMAL LOW (ref 39.0–52.0)
Lymphocytes Relative: 9.3 % — ABNORMAL LOW (ref 12.0–46.0)
Lymphs Abs: 0.9 10*3/uL (ref 0.7–4.0)
Monocytes Relative: 6.2 % (ref 3.0–12.0)
Neutrophils Relative %: 80.8 % — ABNORMAL HIGH (ref 43.0–77.0)
Platelets: 234 10*3/uL (ref 150.0–400.0)
RDW: 20.3 % — ABNORMAL HIGH (ref 11.5–14.6)
WBC: 10.1 10*3/uL (ref 4.5–10.5)

## 2012-03-30 LAB — IBC PANEL
Iron: 27 ug/dL — ABNORMAL LOW (ref 42–165)
Saturation Ratios: 7.2 % — ABNORMAL LOW (ref 20.0–50.0)
Transferrin: 266.1 mg/dL (ref 212.0–360.0)

## 2012-03-30 LAB — FERRITIN: Ferritin: 11.6 ng/mL — ABNORMAL LOW (ref 22.0–322.0)

## 2012-03-30 LAB — VITAMIN B12: Vitamin B-12: 640 pg/mL (ref 211–911)

## 2012-03-30 NOTE — Patient Instructions (Addendum)
We have scheduled an appointment with Dr. Gaylyn Rong at [facility] on 04-11-12 at 10:30am.  This is located directly in front of the main entrance of [facility].    Your physician has requested that you go to the basement for lab work before leaving today

## 2012-03-30 NOTE — Progress Notes (Signed)
HISTORY OF PRESENT ILLNESS:  [name] is a 78 y.o. male with MULTIPLE SIGNIFICANT medical problems as outlined below. He has had previous evaluations for chronic anemia and intermittent Hemoccult-positive stool. He was hospitalized in December 2009 with probable diverticular bleed. GI evaluations have included colonoscopy, upper endoscopy, and capsule endoscopy. He has been admitted to the hospital on several occasions with symptomatic anemia without overt GI bleeding. He has undergone upper endoscopy on 2 occasions with Dr. Arlyce Dice, December 2012 and October 2013. The dictation he was said to have gastric AVMs which were ablated. More recently said to possibly have GAVE. I have reviewed those photographs. Posttransfusion his symptoms improved. He continues with chronic complaints of dizziness, shortness of breath, and weakness he has had for many months. He has also been having some intermittent abdominal discomfort which she relates to iron therapy. He did report some minor rectal bleeding this morning. He has significant renal insufficiency. He is accompanied today by his daughter. Admission hemoglobin on October 10 was 8.4. Discharge hemoglobin October 13 was 10.8. Hemoglobin in Dr. Jeannetta Nap office October 31 was 9.8. Normal MCV. GFR 17  REVIEW OF SYSTEMS:  All non-GI ROS negative except for weakness, shortness of breath, ankle edema, leg pain, insomnia, arthritis, depression  Past Medical History  Diagnosis Date  . Adenomatous colon polyp   . Diverticulosis   . GERD (gastroesophageal reflux disease)   . Hiatal hernia   . Alzheimer disease   . Gout     "hands; backbone; elbows; shoulders"  . Asthma with bronchitis   . COPD (chronic obstructive pulmonary disease)   . Chronic hypoxemic respiratory failure   . OSA (obstructive sleep apnea)   . CHF (congestive heart failure)   . Blood transfusion     "I've had about 6 pints" (12/31/2011)  . Pneumonia   . Anemia   . GI bleed   . Coronary  artery disease   . Hypertension   . Peripheral vascular disease     PAD  . Dysrhythmia     "skips"  . Anginal pain   . Myocardial infarction     "they said I had 2 light ones"  . Shortness of breath 12/31/2011    "all the time"  . Type II diabetes mellitus   . Arthritis     "plenty"  . Chronic lower back pain   . Renal insufficiency     "went on dialysis probably 3 times"  . Skin cancer     "forehead; arms"  . Depression   . Diverticulosis   . Colon polyps     adenomatous and hyperplastic  . GERD (gastroesophageal reflux disease)   . Hiatal hernia     Past Surgical History  Procedure Date  . Hand surgery 1970's    "chain saw accident; had to reattach fingers on left hand"  . Esophagogastroduodenoscopy 05/05/2011    Procedure: ESOPHAGOGASTRODUODENOSCOPY (EGD);  Surgeon: Louis Meckel, MD;  Location: [facility];  Service: Endoscopy;  Laterality: N/A;  . Flexible sigmoidoscopy 05/05/2011    Procedure: FLEXIBLE SIGMOIDOSCOPY;  Surgeon: Louis Meckel, MD;  Location: [facility] ENDOSCOPY;  Service: Endoscopy;  Laterality: N/A;  . Laparoscopic cholecystectomy ~ 2001  . Excisional hemorrhoidectomy 1950's  . Cataract extraction w/ intraocular lens  implant, bilateral   . Coronary artery bypass graft 1997    CABG X2  . Iliac artery stent 11/2011    left/H&P  . Renal artery stent 2010    left/H&P  . Iliac artery stent  11/2011    right  . Coronary angioplasty with stent placement 12/31/2011    "I have 15 stents; heart, kidney, aorta, legs"  . Colonoscopy w/ biopsies and polypectomy     "have had probably 5 cut out" (12/31/2011)  . Enteroscopy 03/04/2012    Procedure: ENTEROSCOPY;  Surgeon: Louis Meckel, MD;  Location: [facility] END[facility]COPY;  Service: Endoscopy;  Laterality: N/A;  jessica/leone    Social History EBAN WEICK  reports that he quit smoking about 21 years ago. His smoking use included Cigarettes. He has a 112.5 pack-year smoking history. He quit smokeless tobacco use about  21 years ago. His smokeless tobacco use included Chew. He reports that he does not drink alcohol or use illicit drugs.  family history includes ALS in his sister and unspecified family members; Arthritis in his mother; Diabetes in his mother; Heart disease in his brother, father, and sister; Lung cancer in his brother; and Stomach cancer in his maternal grandfather.  There is no history of Colon cancer.  Allergies  Allergen Reactions  . Donepezil Diarrhea  . Heparin Itching and Rash  . Hydralazine Other (See Comments)    DO NOT TAKE PER MD  . Hydromorphone Itching  . Morphine Itching  . Plavix (Clopidogrel Bisulfate) Other (See Comments)    GI BLEEDING; "so bad I had to take blood; dr took me off it"  . Promethazine Other (See Comments)    DO NOT TAKE PER MD  . Sulfonamide Derivatives Itching  . Zaroxolyn (Metolazone) Diarrhea  . Cefuroxime Other (See Comments)    REACTION: unkown reaction  . Metoprolol Other (See Comments)    unknown  . Ertapenem Itching and Rash    12/31/2011 pt does not recall allergy or severity       PHYSICAL EXAMINATION: Vital signs: BP 90/20  Pulse 72  Ht 5\' 7"  (1.702 m)  Wt 236 lb (107.049 kg)  BMI 36.96 kg/m2  Constitutional: chronically ill, obese, unhealthy-appearing, no acute distress Psychiatric: alert and oriented x3, cooperative Eyes: extraocular movements intact, anicteric, conjunctiva pink Mouth: oral pharynx moist, no lesions Neck: supple no lymphadenopathy Cardiovascular: heart regular rate and rhythm Lungs: clear to auscultation bilaterally, distant breath sounds Abdomen: soft,obese, nontender, nondistended, no obvious ascites, no peritoneal signs, normal bowel sounds, no organomegaly Rectal:no external abnormalities. Yellow/brown trace Hemoccult-positive stool Extremities: 1-2+ lower extremity edema bilaterally. Chronic stasis changes Skin: no lesions on visible extremities Neuro: No focal deficits.   ASSESSMENT:  #33. 78 year old  with MULTIPLE SIGNIFICANT medical problems who presents today post hospitalization for presumed GI bleed secondary to gastric AVMs or GAVE. Patient has had multiple prior GI evaluations as outlined. I suspect that the patient's ANEMIA IS MULTIFACTORIAL. He certainly may have GI contributions. I question the diagnosis of GAVE given the endoscopic appearance and location. In any event, I suspect that a significant portion of his anemia that recurs is secondary to chronic disease. Aside from iron replacement (if he is iron deficient) and endoscopy for truly acute or subacute bleeding, no other real recommendations from a GI standpoint. See below. He may need to have regular blood counts checked with his primary provider. There may be a certain threshold for which periodic empiric transfusion is recommended to avoid symptomatic anemia and hospitalization.   PLAN:  #1. CBC today #2. Anemia panel with iron studies #3. Hematology evaluation as his anemia is almost certainly multifactorial #4. Resume general medical care with Dr. Jeannetta Nap. PCP to address all non-GI complaints.

## 2012-03-31 ENCOUNTER — Telehealth: Payer: Self-pay | Admitting: Oncology

## 2012-03-31 ENCOUNTER — Observation Stay (HOSPITAL_COMMUNITY)
Admission: EM | Admit: 2012-03-31 | Discharge: 2012-04-04 | Disposition: A | Discharge status: Home / Self Care | Payer: BC Managed Care – PPO | Attending: Family Medicine | Admitting: Family Medicine

## 2012-03-31 ENCOUNTER — Encounter (HOSPITAL_COMMUNITY): Payer: Self-pay | Admitting: Emergency Medicine

## 2012-03-31 DIAGNOSIS — I5033 Acute on chronic diastolic (congestive) heart failure: Secondary | ICD-10-CM

## 2012-03-31 DIAGNOSIS — N289 Disorder of kidney and ureter, unspecified: Secondary | ICD-10-CM | POA: Insufficient documentation

## 2012-03-31 DIAGNOSIS — I519 Heart disease, unspecified: Secondary | ICD-10-CM

## 2012-03-31 DIAGNOSIS — K922 Gastrointestinal hemorrhage, unspecified: Secondary | ICD-10-CM

## 2012-03-31 DIAGNOSIS — I10 Essential (primary) hypertension: Secondary | ICD-10-CM | POA: Diagnosis present

## 2012-03-31 DIAGNOSIS — R112 Nausea with vomiting, unspecified: Secondary | ICD-10-CM | POA: Insufficient documentation

## 2012-03-31 DIAGNOSIS — N19 Unspecified kidney failure: Secondary | ICD-10-CM

## 2012-03-31 DIAGNOSIS — I129 Hypertensive chronic kidney disease with stage 1 through stage 4 chronic kidney disease, or unspecified chronic kidney disease: Secondary | ICD-10-CM | POA: Insufficient documentation

## 2012-03-31 DIAGNOSIS — D62 Acute posthemorrhagic anemia: Secondary | ICD-10-CM | POA: Insufficient documentation

## 2012-03-31 DIAGNOSIS — J209 Acute bronchitis, unspecified: Secondary | ICD-10-CM

## 2012-03-31 DIAGNOSIS — I739 Peripheral vascular disease, unspecified: Secondary | ICD-10-CM

## 2012-03-31 DIAGNOSIS — Z794 Long term (current) use of insulin: Secondary | ICD-10-CM | POA: Insufficient documentation

## 2012-03-31 DIAGNOSIS — I2510 Atherosclerotic heart disease of native coronary artery without angina pectoris: Secondary | ICD-10-CM | POA: Insufficient documentation

## 2012-03-31 DIAGNOSIS — Z79899 Other long term (current) drug therapy: Secondary | ICD-10-CM | POA: Insufficient documentation

## 2012-03-31 DIAGNOSIS — E860 Dehydration: Secondary | ICD-10-CM

## 2012-03-31 DIAGNOSIS — R079 Chest pain, unspecified: Principal | ICD-10-CM | POA: Insufficient documentation

## 2012-03-31 DIAGNOSIS — K31811 Angiodysplasia of stomach and duodenum with bleeding: Secondary | ICD-10-CM

## 2012-03-31 DIAGNOSIS — I5032 Chronic diastolic (congestive) heart failure: Secondary | ICD-10-CM | POA: Diagnosis present

## 2012-03-31 DIAGNOSIS — K219 Gastro-esophageal reflux disease without esophagitis: Secondary | ICD-10-CM | POA: Insufficient documentation

## 2012-03-31 DIAGNOSIS — E785 Hyperlipidemia, unspecified: Secondary | ICD-10-CM | POA: Insufficient documentation

## 2012-03-31 DIAGNOSIS — E119 Type 2 diabetes mellitus without complications: Secondary | ICD-10-CM | POA: Diagnosis present

## 2012-03-31 DIAGNOSIS — E669 Obesity, unspecified: Secondary | ICD-10-CM | POA: Insufficient documentation

## 2012-03-31 DIAGNOSIS — I509 Heart failure, unspecified: Secondary | ICD-10-CM | POA: Insufficient documentation

## 2012-03-31 DIAGNOSIS — G4733 Obstructive sleep apnea (adult) (pediatric): Secondary | ICD-10-CM

## 2012-03-31 DIAGNOSIS — E875 Hyperkalemia: Secondary | ICD-10-CM

## 2012-03-31 DIAGNOSIS — N189 Chronic kidney disease, unspecified: Secondary | ICD-10-CM | POA: Insufficient documentation

## 2012-03-31 DIAGNOSIS — K921 Melena: Secondary | ICD-10-CM

## 2012-03-31 DIAGNOSIS — N179 Acute kidney failure, unspecified: Secondary | ICD-10-CM

## 2012-03-31 DIAGNOSIS — Z951 Presence of aortocoronary bypass graft: Secondary | ICD-10-CM

## 2012-03-31 LAB — CBC WITH DIFFERENTIAL/PLATELET
Basophils Relative: 0 % (ref 0–1)
Eosinophils Absolute: 0.7 10*3/uL (ref 0.0–0.7)
MCH: 27.3 pg (ref 26.0–34.0)
MCHC: 31.4 g/dL (ref 30.0–36.0)
Neutro Abs: 8.8 10*3/uL — ABNORMAL HIGH (ref 1.7–7.7)
Neutrophils Relative %: 73 % (ref 43–77)
Platelets: 223 10*3/uL (ref 150–400)
RBC: 3.26 MIL/uL — ABNORMAL LOW (ref 4.22–5.81)

## 2012-03-31 LAB — POCT I-STAT TROPONIN I

## 2012-03-31 MED ORDER — SODIUM CHLORIDE 0.9 % IV BOLUS (SEPSIS)
1000.0000 mL | Freq: Once | INTRAVENOUS | Status: AC
  Administered 2012-04-01: 1000 mL via INTRAVENOUS

## 2012-03-31 NOTE — Telephone Encounter (Signed)
C/D 03/31/12 for appt 04/11/12

## 2012-03-31 NOTE — ED Provider Notes (Signed)
History     CSN: [id number]  Arrival date & time 03/31/12  2245   First MD Initiated Contact with Patient 03/31/12 2324      Chief Complaint  Patient presents with  . Nausea  . Emesis  . Diarrhea    (Consider location/radiation/quality/duration/timing/severity/associated sxs/prior treatment) Patient is a 78 y.o. male presenting with vomiting and diarrhea. The history is provided by the patient.  Emesis  Associated symptoms include diarrhea. Pertinent negatives include no abdominal pain, no chills, no cough, no fever and no headaches.  Diarrhea The primary symptoms include vomiting and diarrhea. Primary symptoms do not include fever, abdominal pain or rash.  The illness does not include chills or back pain.  pt c/o 8-10 episodes of nvd today. Emesis clear to sl yellowish, not bloody. Diarrhea watery, not bloody. Denies abd pain. Has noted burning pain in chest on and off today. Non radiating.  No associated sob or diaphoresis. Lasts several minutes. Hx cad. Pt unclear if similar to prior cardiac cp. Also notes hx gerd. Denies known ill contacts or bad food ingestion. No recent abx use. w above symptoms, denies specific exacerbating or alleviating factors. Episodic. No fever or chills. No gu c/o. Denies abd distension. States only prior abd surgery is remote hx cholecystectomy.     Past Medical History  Diagnosis Date  . Adenomatous colon polyp   . Diverticulosis   . GERD (gastroesophageal reflux disease)   . Hiatal hernia   . Alzheimer disease   . Gout     "hands; backbone; elbows; shoulders"  . Asthma with bronchitis   . COPD (chronic obstructive pulmonary disease)   . Chronic hypoxemic respiratory failure   . OSA (obstructive sleep apnea)   . CHF (congestive heart failure)   . Blood transfusion     "I've had about 6 pints" (12/31/2011)  . Pneumonia   . Anemia   . GI bleed   . Coronary artery disease   . Hypertension   . Peripheral vascular disease     PAD  .  Dysrhythmia     "skips"  . Anginal pain   . Myocardial infarction     "they said I had 2 light ones"  . Shortness of breath 12/31/2011    "all the time"  . Type II diabetes mellitus   . Arthritis     "plenty"  . Chronic lower back pain   . Renal insufficiency     "went on dialysis probably 3 times"  . Skin cancer     "forehead; arms"  . Depression   . Diverticulosis   . Colon polyps     adenomatous and hyperplastic  . GERD (gastroesophageal reflux disease)   . Hiatal hernia     Past Surgical History  Procedure Date  . Hand surgery 1970's    "chain saw accident; had to reattach fingers on left hand"  . Esophagogastroduodenoscopy 05/05/2011    Procedure: ESOPHAGOGASTRODUODENOSCOPY (EGD);  Surgeon: Louis Meckel, MD;  Location: [facility];  Service: Endoscopy;  Laterality: N/A;  . Flexible sigmoidoscopy 05/05/2011    Procedure: FLEXIBLE SIGMOIDOSCOPY;  Surgeon: Louis Meckel, MD;  Location: [facility];  Service: Endoscopy;  Laterality: N/A;  . Laparoscopic cholecystectomy ~ 2001  . Excisional hemorrhoidectomy 1950's  . Cataract extraction w/ intraocular lens  implant, bilateral   . Coronary artery bypass graft 1997    CABG X2  . Iliac artery stent 11/2011    left/H&P  . Renal artery stent 2010  left/H&P  . Iliac artery stent 11/2011    right  . Coronary angioplasty with stent placement 12/31/2011    "I have 15 stents; heart, kidney, aorta, legs"  . Colonoscopy w/ biopsies and polypectomy     "have had probably 5 cut out" (12/31/2011)  . Enteroscopy 03/04/2012    Procedure: ENTEROSCOPY;  Surgeon: Louis Meckel, MD;  Location: [facility] ENDOSCOPY;  Service: Endoscopy;  Laterality: N/A;  jessica/leone    Family History  Problem Relation Age of Onset  . Lung cancer Brother   . Diabetes Mother   . Heart disease Sister   . Stomach cancer Maternal Grandfather   . Heart disease Father   . Heart disease Brother   . Arthritis Mother   . ALS Sister   . ALS      neice  .  ALS      nephew  . Colon cancer Neg Hx     History  Substance Use Topics  . Smoking status: Former Smoker -- 2.5 packs/day for 45 years    Types: Cigarettes    Quit date: 05/25/1990  . Smokeless tobacco: Former Neurosurgeon    Types: Chew    Quit date: 06/01/1990  . Alcohol Use: No     Comment: 12/31/2011 "used to drink; last alcohol has been 30 years I imagine"      Review of Systems  Constitutional: Negative for fever and chills.  HENT: Negative for neck pain.   Eyes: Negative for redness.  Respiratory: Negative for cough and shortness of breath.   Cardiovascular: Positive for chest pain. Negative for palpitations and leg swelling.  Gastrointestinal: Positive for vomiting and diarrhea. Negative for abdominal pain.  Genitourinary: Negative for flank pain.  Musculoskeletal: Negative for back pain.  Skin: Negative for rash.  Neurological: Negative for headaches.  Hematological: Does not bruise/bleed easily.  Psychiatric/Behavioral: Negative for confusion.    Allergies  Donepezil; Heparin; Hydralazine; Hydromorphone; Morphine; Plavix; Promethazine; Sulfonamide derivatives; Zaroxolyn; Cefuroxime; Metoprolol; and Ertapenem  Home Medications   Current Outpatient Rx  Name  Route  Sig  Dispense  Refill  . ACETAMINOPHEN ER 650 MG PO TBCR   Oral   Take 1,300 mg by mouth 2 (two) times daily.          . ALBUTEROL SULFATE HFA 108 (90 BASE) MCG/ACT IN AERS   Inhalation   Inhale 2 puffs into the lungs every 4 (four) hours as needed for wheezing or shortness of breath.   3 Inhaler   3   . ALBUTEROL SULFATE (2.5 MG/3ML) 0.083% IN NEBU   Nebulization   Take 2.5 mg by nebulization every 6 (six) hours as needed. For shortness of breath         . ALLOPURINOL 300 MG PO TABS   Oral   Take 300 mg by mouth 2 (two) times daily.         Marland Kitchen AMLODIPINE BESYLATE 10 MG PO TABS   Oral   Take 1 tablet (10 mg total) by mouth daily.   30 tablet   5   . ASPIRIN 325 MG PO TABS   Oral   Take  325 mg by mouth daily.           . ATORVASTATIN CALCIUM 20 MG PO TABS   Oral   Take 20 mg by mouth at bedtime.         . BUMETANIDE 2 MG PO TABS   Oral   Take 1 tablet (2 mg total) by mouth 2 (  two) times daily.         Marland Kitchen CLONIDINE HCL ER 0.1 MG PO TB12   Oral   Take 1 tablet (0.1 mg total) by mouth 2 (two) times daily.   60 tablet   5   . COLCHICINE 0.6 MG PO TABS   Oral   Take 0.6 mg by mouth daily.          Marland Kitchen ESCITALOPRAM OXALATE 10 MG PO TABS   Oral   Take 20 mg by mouth daily.          Marland Kitchen EZETIMIBE 10 MG PO TABS   Oral   Take 10 mg by mouth daily.           Marland Kitchen FOLIC ACID 400 MCG PO TABS   Oral   Take 400 mcg by mouth daily.           Marland Kitchen GABAPENTIN 100 MG PO TABS   Oral   Take 100 mg by mouth at bedtime.           . INSULIN ASPART 100 UNIT/ML [place] SOLN   Subcutaneous   Inject 10-20 Units into the skin 3 (three) times daily before meals. 10 units in the morning, 20 units at noon and 20 units in the evening         . INSULIN GLARGINE 100 UNIT/ML [place] SOLN   Subcutaneous   Inject 57 Units into the skin 2 (two) times daily.          . ISOSORBIDE MONONITRATE ER 30 MG PO TB24   Oral   Take 60 mg by mouth daily.          Marland Kitchen LISINOPRIL 5 MG PO TABS   Oral   Take 1 tablet (5 mg total) by mouth daily.   30 tablet   11   . METOLAZONE 5 MG PO TABS   Oral   Take 5 mg by mouth every other day. 30 minutes before Bumex dose         . ADULT MULTIVITAMIN W/MINERALS CH   Oral   Take 0.5 tablets by mouth 2 (two) times daily.         Marland Kitchen NITROGLYCERIN 0.4 MG SL SUBL   Sublingual   Place 0.4 mg under the tongue every 5 (five) minutes as needed. May repeat x 3 for chest pain         . PANTOPRAZOLE SODIUM 40 MG PO TBEC   Oral   Take 40 mg by mouth daily.           Marland Kitchen PREDNISONE 5 MG PO TABS   Oral   Take 5 mg by mouth daily.           Marland Kitchen TIOTROPIUM BROMIDE MONOHYDRATE 18 MCG IN CAPS   Inhalation   Place 18 mcg into inhaler and inhale daily.               BP 101/45  Pulse 90  Temp 98.1 F (36.7 C) (Oral)  Resp 18  SpO2 97%  Physical Exam  Nursing note and vitals reviewed. Constitutional: He is oriented to person, place, and time. He appears well-developed and well-nourished. No distress.  HENT:  Head: Atraumatic.  Mouth/Throat: Oropharynx is clear and moist.  Eyes: Pupils are equal, round, and reactive to light.  Neck: Neck supple. No tracheal deviation present.  Cardiovascular: Normal rate, regular rhythm, normal heart sounds and intact distal pulses.  Exam reveals no gallop and no friction rub.  No murmur heard. Pulmonary/Chest: Effort normal and breath sounds normal. No accessory muscle usage. No respiratory distress. He exhibits no tenderness.  Abdominal: Soft. Bowel sounds are normal. He exhibits no distension and no mass. There is no tenderness. There is no rebound and no guarding.  Genitourinary:       No cva tenderness  Musculoskeletal: Normal range of motion. He exhibits no edema and no tenderness.  Neurological: He is alert and oriented to person, place, and time.  Skin: Skin is warm and dry.  Psychiatric: He has a normal mood and affect.    ED Course  Procedures (including critical care time)  Results for orders placed during the hospital encounter of 03/31/12  CBC WITH DIFFERENTIAL      Component Value Range   WBC 12.0 (*) 4.0 - 10.5 K/uL   RBC 3.26 (*) 4.22 - 5.81 MIL/uL   Hemoglobin 8.9 (*) 13.0 - 17.0 g/dL   HCT 16.1 (*) 09.6 - 04.5 %   MCV 86.8  78.0 - 100.0 fL   MCH 27.3  26.0 - 34.0 pg   MCHC 31.4  30.0 - 36.0 g/dL   RDW 40.9 (*) 81.1 - 91.4 %   Platelets 223  150 - 400 K/uL   Neutrophils Relative 73  43 - 77 %   Neutro Abs 8.8 (*) 1.7 - 7.7 K/uL   Lymphocytes Relative 13  12 - 46 %   Lymphs Abs 1.6  0.7 - 4.0 K/uL   Monocytes Relative 8  3 - 12 %   Monocytes Absolute 1.0  0.1 - 1.0 K/uL   Eosinophils Relative 6 (*) 0 - 5 %   Eosinophils Absolute 0.7  0.0 - 0.7 K/uL   Basophils  Relative 0  0 - 1 %   Basophils Absolute 0.0  0.0 - 0.1 K/uL  COMPREHENSIVE METABOLIC PANEL      Component Value Range   Sodium 136  135 - 145 mEq/L   Potassium 4.6  3.5 - 5.1 mEq/L   Chloride 99  96 - 112 mEq/L   CO2 21  19 - 32 mEq/L   Glucose, Bld 110 (*) 70 - 99 mg/dL   BUN 782 (*) 6 - 23 mg/dL   Creatinine, Ser 9.56 (*) 0.50 - 1.35 mg/dL   Calcium 9.3  8.4 - 21.3 mg/dL   Total Protein 6.9  6.0 - 8.3 g/dL   Albumin 3.9  3.5 - 5.2 g/dL   AST 23  0 - 37 U/L   ALT 16  0 - 53 U/L   Alkaline Phosphatase 57  39 - 117 U/L   Total Bilirubin 0.2 (*) 0.3 - 1.2 mg/dL   GFR calc non Af Amer 12 (*) >90 mL/min   GFR calc Af Amer 14 (*) >90 mL/min  TROPONIN I      Component Value Range   Troponin I <0.30  <0.30 ng/mL  POCT I-STAT TROPONIN I      Component Value Range   Troponin i, poc 0.09 (*) 0.00 - 0.08 ng/mL   Comment NOTIFIED PHYSICIAN     Comment 3           LIPASE, BLOOD      Component Value Range   Lipase 127 (*) 11 - 59 U/L   Dg Chest 2 View  03/03/2012  *RADIOLOGY REPORT*  Clinical Data: Shortness of breath  CHEST - 2 VIEW  Comparison: 12/31/2011  Findings: The heart is again enlarged in size.  Postsurgical changes are again  seen.  The lungs are clear bilaterally.  Mild interstitial changes are again noted.  IMPRESSION: No acute abnormality noted.  No significant change from prior exam.   Original Report Authenticated By: Phillips Odor, M.D.    US Renal  03/04/2012  *RADIOLOGY REPORT*  Clinical Data:  Acute renal failure on chronic renal insufficiency. Hypertension. Diabetes.  RENAL/URINARY TRACT ULTRASOUND COMPLETE  Comparison:  01/11/2010  Findings:  Right Kidney:  Measures 10.8 cm length.  Mildly increased renal parenchymal echogenicity.  No evidence of renal masses.  No evidence of hydronephrosis.  Left Kidney:  Measures 11.1 cm length.  Mildly increased renal parenchymal echogenicity.  No evidence of renal masses.  No evidence of hydronephrosis.  A small subcapsular cyst  noted in the lower pole measuring 2.0 cm in maximum diameter.  Bladder:  Appears normal for degree of bladder distention.  IMPRESSION:  1.  Mildly increased renal parenchymal echogenicity, consistent with medical renal disease. 2.  No evidence of hydronephrosis.   Original Report Authenticated By: Danae Orleans, M.D.        MDM  Iv ns bolus. Labs.  Reviewed nursing notes and prior charts for additional history.    Date: 04/01/2012  Rate: 82  Rhythm: normal sinus rhythm  QRS Axis: normal  Intervals: normal  ST/T Wave abnormalities: nonspecific ST/T changes  Conduction Disutrbances:nonspecific intraventricular conduction delay  Narrative Interpretation:   Old EKG Reviewed: changes noted   Recheck no chest pain or discomfort.   From labs, hx chronic renal insuff, although cr increased today above baseline. ?dehydration/volume depletion.  Initial trop in mainlab normal.  Given nvd, ?dehydration, bump in cr, and atypical cp will admit for hydration, serial cardiac markers.  Discussed w triad, they indicate tele, team 10, obs.          Suzi Roots, MD 04/01/12 [phone]

## 2012-03-31 NOTE — ED Notes (Signed)
Patient from home called EMS with nausea, vomiting, diarrhea and burning in the chest.  4mg  of Zofran given in route.  Patient denies chest pain at this time.   Hx. A. Fib, COPD, Emphysema. Patient has absent pedal pulses because of clotting disorder.

## 2012-04-01 ENCOUNTER — Observation Stay (HOSPITAL_COMMUNITY): Payer: BC Managed Care – PPO

## 2012-04-01 ENCOUNTER — Encounter (HOSPITAL_COMMUNITY): Payer: Self-pay | Admitting: *Deleted

## 2012-04-01 DIAGNOSIS — D62 Acute posthemorrhagic anemia: Secondary | ICD-10-CM

## 2012-04-01 DIAGNOSIS — R079 Chest pain, unspecified: Secondary | ICD-10-CM

## 2012-04-01 DIAGNOSIS — I509 Heart failure, unspecified: Secondary | ICD-10-CM

## 2012-04-01 DIAGNOSIS — I5032 Chronic diastolic (congestive) heart failure: Secondary | ICD-10-CM

## 2012-04-01 DIAGNOSIS — R6889 Other general symptoms and signs: Secondary | ICD-10-CM

## 2012-04-01 DIAGNOSIS — N289 Disorder of kidney and ureter, unspecified: Secondary | ICD-10-CM

## 2012-04-01 DIAGNOSIS — R112 Nausea with vomiting, unspecified: Secondary | ICD-10-CM | POA: Diagnosis present

## 2012-04-01 DIAGNOSIS — I2510 Atherosclerotic heart disease of native coronary artery without angina pectoris: Secondary | ICD-10-CM | POA: Diagnosis present

## 2012-04-01 DIAGNOSIS — R197 Diarrhea, unspecified: Secondary | ICD-10-CM

## 2012-04-01 DIAGNOSIS — N189 Chronic kidney disease, unspecified: Secondary | ICD-10-CM

## 2012-04-01 LAB — URINALYSIS, MICROSCOPIC ONLY
Bilirubin Urine: NEGATIVE
Glucose, UA: NEGATIVE mg/dL
Hgb urine dipstick: NEGATIVE
Ketones, ur: NEGATIVE mg/dL
Leukocytes, UA: NEGATIVE
pH: 5 (ref 5.0–8.0)

## 2012-04-01 LAB — CBC
HCT: 26 % — ABNORMAL LOW (ref 39.0–52.0)
Hemoglobin: 8.3 g/dL — ABNORMAL LOW (ref 13.0–17.0)
MCH: 28.1 pg (ref 26.0–34.0)
MCHC: 31.9 g/dL (ref 30.0–36.0)
RBC: 2.95 MIL/uL — ABNORMAL LOW (ref 4.22–5.81)

## 2012-04-01 LAB — COMPREHENSIVE METABOLIC PANEL
ALT: 16 U/L (ref 0–53)
AST: 23 U/L (ref 0–37)
Albumin: 3.9 g/dL (ref 3.5–5.2)
Alkaline Phosphatase: 57 U/L (ref 39–117)
Chloride: 99 mEq/L (ref 96–112)
Potassium: 4.6 mEq/L (ref 3.5–5.1)
Sodium: 136 mEq/L (ref 135–145)
Total Protein: 6.9 g/dL (ref 6.0–8.3)

## 2012-04-01 LAB — GLUCOSE, CAPILLARY
Glucose-Capillary: 92 mg/dL (ref 70–99)
Glucose-Capillary: 79 mg/dL (ref 70–99)
Glucose-Capillary: 123 mg/dL — ABNORMAL HIGH (ref 70–99)

## 2012-04-01 LAB — BASIC METABOLIC PANEL
BUN: 128 mg/dL — ABNORMAL HIGH (ref 6–23)
CO2: 22 mEq/L (ref 19–32)
Chloride: 100 mEq/L (ref 96–112)
GFR calc non Af Amer: 12 mL/min — ABNORMAL LOW (ref >90)
Glucose, Bld: 110 mg/dL — ABNORMAL HIGH (ref 70–99)
Potassium: 4.4 mEq/L (ref 3.5–5.1)
Sodium: 135 mEq/L (ref 135–145)

## 2012-04-01 LAB — HEMOGLOBIN A1C: Hgb A1c MFr Bld: 6.2 % — ABNORMAL HIGH (ref <5.7)

## 2012-04-01 LAB — TROPONIN I
Troponin I: <0.3 ng/mL (ref <0.30)
Troponin I: <0.3 ng/mL (ref <0.30)
Troponin I: <0.3 ng/mL (ref <0.30)

## 2012-04-01 LAB — CK TOTAL AND CKMB (NOT AT ARMC)
Relative Index: 2.7 — ABNORMAL HIGH (ref 0.0–2.5)
Total CK: 227 U/L (ref 7–232)

## 2012-04-01 LAB — MRSA PCR SCREENING: MRSA by PCR: NEGATIVE

## 2012-04-01 MED ORDER — ONDANSETRON HCL 4 MG/2ML IJ SOLN
4.0000 mg | Freq: Once | INTRAMUSCULAR | Status: AC
  Administered 2012-04-01: 4 mg via INTRAVENOUS
  Filled 2012-04-01: qty 2

## 2012-04-01 MED ORDER — ACETAMINOPHEN 650 MG RE SUPP: 650.0000 mg | Freq: Four times a day (QID) | RECTAL | Status: DC | PRN

## 2012-04-01 MED ORDER — ASPIRIN 325 MG PO TABS
325.0000 mg | ORAL_TABLET | Freq: Every day | ORAL | Status: DC
  Administered 2012-04-01 – 2012-04-04 (×4): 325 mg via ORAL
  Filled 2012-04-01 (×4): qty 1

## 2012-04-01 MED ORDER — AMLODIPINE BESYLATE 10 MG PO TABS
10.0000 mg | ORAL_TABLET | Freq: Every day | ORAL | Status: DC
  Administered 2012-04-01 – 2012-04-04 (×4): 10 mg via ORAL
  Filled 2012-04-01 (×4): qty 1

## 2012-04-01 MED ORDER — ALBUTEROL SULFATE HFA 108 (90 BASE) MCG/ACT IN AERS: 2.0000 | INHALATION_SPRAY | RESPIRATORY_TRACT | Status: DC | PRN

## 2012-04-01 MED ORDER — ADULT MULTIVITAMIN W/MINERALS CH
0.5000 | ORAL_TABLET | Freq: Two times a day (BID) | ORAL | Status: DC
  Filled 2012-04-01 (×2): qty 1

## 2012-04-01 MED ORDER — ONDANSETRON HCL 4 MG/2ML IJ SOLN: 4.0000 mg | Freq: Three times a day (TID) | INTRAMUSCULAR | Status: DC | PRN

## 2012-04-01 MED ORDER — ESCITALOPRAM OXALATE 20 MG PO TABS
20.0000 mg | ORAL_TABLET | Freq: Every day | ORAL | Status: DC
  Administered 2012-04-01 – 2012-04-04 (×4): 20 mg via ORAL
  Filled 2012-04-01 (×4): qty 1

## 2012-04-01 MED ORDER — FOLIC ACID 0.5 MG HALF TAB
400.0000 ug | ORAL_TABLET | Freq: Every day | ORAL | Status: DC
  Administered 2012-04-01 – 2012-04-04 (×4): 0.5 mg via ORAL
  Filled 2012-04-01 (×4): qty 1

## 2012-04-01 MED ORDER — INSULIN GLARGINE 100 UNIT/ML ~~LOC~~ SOLN
57.0000 [IU] | Freq: Two times a day (BID) | SUBCUTANEOUS | Status: DC
  Administered 2012-04-01 (×3): 57 [IU] via SUBCUTANEOUS

## 2012-04-01 MED ORDER — EZETIMIBE 10 MG PO TABS
10.0000 mg | ORAL_TABLET | Freq: Every day | ORAL | Status: DC
  Administered 2012-04-01 – 2012-04-04 (×4): 10 mg via ORAL
  Filled 2012-04-01 (×4): qty 1

## 2012-04-01 MED ORDER — ALLOPURINOL 300 MG PO TABS
300.0000 mg | ORAL_TABLET | Freq: Two times a day (BID) | ORAL | Status: DC
  Administered 2012-04-01 – 2012-04-02 (×3): 300 mg via ORAL
  Filled 2012-04-01 (×4): qty 1

## 2012-04-01 MED ORDER — SODIUM CHLORIDE 0.9 % IV SOLN
INTRAVENOUS | Status: DC
  Administered 2012-04-01 – 2012-04-03 (×4): via INTRAVENOUS

## 2012-04-01 MED ORDER — CLONIDINE HCL ER 0.1 MG PO TB12
0.1000 mg | ORAL_TABLET | Freq: Two times a day (BID) | ORAL | Status: DC
  Administered 2012-04-01 – 2012-04-04 (×7): 0.1 mg via ORAL
  Filled 2012-04-01 (×8): qty 1

## 2012-04-01 MED ORDER — TIOTROPIUM BROMIDE MONOHYDRATE 18 MCG IN CAPS
18.0000 ug | ORAL_CAPSULE | Freq: Every day | RESPIRATORY_TRACT | Status: DC
  Administered 2012-04-01 – 2012-04-03 (×3): 18 ug via RESPIRATORY_TRACT
  Filled 2012-04-01 (×2): qty 5

## 2012-04-01 MED ORDER — SODIUM CHLORIDE 0.9 % IJ SOLN
3.0000 mL | Freq: Two times a day (BID) | INTRAMUSCULAR | Status: DC
  Administered 2012-04-01 – 2012-04-03 (×5): 3 mL via INTRAVENOUS

## 2012-04-01 MED ORDER — ONDANSETRON HCL 4 MG PO TABS: 4.0000 mg | ORAL_TABLET | Freq: Four times a day (QID) | ORAL | Status: DC | PRN

## 2012-04-01 MED ORDER — ZOLPIDEM TARTRATE 5 MG PO TABS: 5.0000 mg | ORAL_TABLET | Freq: Every evening | ORAL | Status: DC | PRN

## 2012-04-01 MED ORDER — ISOSORBIDE MONONITRATE ER 60 MG PO TB24
60.0000 mg | ORAL_TABLET | Freq: Every day | ORAL | Status: DC
  Administered 2012-04-01 – 2012-04-04 (×4): 60 mg via ORAL
  Filled 2012-04-01 (×4): qty 1

## 2012-04-01 MED ORDER — SODIUM CHLORIDE 0.9 % IV SOLN
INTRAVENOUS | Status: AC
  Administered 2012-04-01: 02:00:00 via INTRAVENOUS

## 2012-04-01 MED ORDER — ONDANSETRON HCL 4 MG/2ML IJ SOLN: 4.0000 mg | Freq: Four times a day (QID) | INTRAMUSCULAR | Status: DC | PRN

## 2012-04-01 MED ORDER — GABAPENTIN 100 MG PO CAPS
100.0000 mg | ORAL_CAPSULE | Freq: Every day | ORAL | Status: DC
  Administered 2012-04-01 – 2012-04-03 (×3): 100 mg via ORAL
  Filled 2012-04-01 (×4): qty 1

## 2012-04-01 MED ORDER — ADULT MULTIVITAMIN W/MINERALS CH
1.0000 | ORAL_TABLET | Freq: Every day | ORAL | Status: DC
  Administered 2012-04-01 – 2012-04-04 (×4): 1 via ORAL
  Filled 2012-04-01 (×4): qty 1

## 2012-04-01 MED ORDER — ALBUTEROL SULFATE (5 MG/ML) 0.5% IN NEBU: 2.5000 mg | INHALATION_SOLUTION | Freq: Four times a day (QID) | RESPIRATORY_TRACT | Status: DC | PRN

## 2012-04-01 MED ORDER — ALUM & MAG HYDROXIDE-SIMETH 200-200-20 MG/5ML PO SUSP: 30.0000 mL | Freq: Four times a day (QID) | ORAL | Status: DC | PRN

## 2012-04-01 MED ORDER — ATORVASTATIN CALCIUM 20 MG PO TABS
20.0000 mg | ORAL_TABLET | Freq: Every day | ORAL | Status: DC
  Administered 2012-04-01 – 2012-04-03 (×3): 20 mg via ORAL
  Filled 2012-04-01 (×4): qty 1

## 2012-04-01 MED ORDER — NITROGLYCERIN 0.4 MG SL SUBL: 0.4000 mg | SUBLINGUAL_TABLET | SUBLINGUAL | Status: DC | PRN

## 2012-04-01 MED ORDER — INSULIN ASPART 100 UNIT/ML ~~LOC~~ SOLN
0.0000 [IU] | SUBCUTANEOUS | Status: DC
  Administered 2012-04-01 – 2012-04-02 (×2): 1 [IU] via SUBCUTANEOUS
  Administered 2012-04-02: 21:00:00 via SUBCUTANEOUS

## 2012-04-01 MED ORDER — PREDNISONE 5 MG PO TABS
5.0000 mg | ORAL_TABLET | Freq: Every day | ORAL | Status: DC
  Administered 2012-04-01 – 2012-04-04 (×4): 5 mg via ORAL
  Filled 2012-04-01 (×6): qty 1

## 2012-04-01 MED ORDER — PANTOPRAZOLE SODIUM 40 MG IV SOLR
40.0000 mg | INTRAVENOUS | Status: DC
  Administered 2012-04-01 – 2012-04-03 (×3): 40 mg via INTRAVENOUS
  Filled 2012-04-01 (×3): qty 40

## 2012-04-01 MED ORDER — ACETAMINOPHEN 325 MG PO TABS: 650.0000 mg | ORAL_TABLET | Freq: Four times a day (QID) | ORAL | Status: DC | PRN

## 2012-04-01 MED ORDER — COLCHICINE 0.6 MG PO TABS
0.6000 mg | ORAL_TABLET | Freq: Every day | ORAL | Status: DC
  Administered 2012-04-01 – 2012-04-02 (×2): 0.6 mg via ORAL
  Filled 2012-04-01 (×2): qty 1

## 2012-04-01 NOTE — ED Notes (Signed)
Patient returned from Radiology. 

## 2012-04-01 NOTE — H&P (Signed)
[facility] History and Physical  [name] ZOX:[id number] DOB: 04-03-1934 DOA: 03/31/2012  Referring physician:  PCP: Kaleen Mask, MD  Specialists:   Chief Complaint: Nausea, Vomiting and Diarrhea  HPI: [name] is a 78 y.o. male with multiple medical problems including CAD,PVD, DM2, who presents to the ED with complaints of nausea, vomiting and diarrhea which has gotten worse over the past 24 hours along with complaints of chest pain and chest tightness intermittently for the past 3 days.   He reports having nausea and vomiting and diarrhea for the past 3 week since his GI procedure ( where he underwent cauterization for a GI bleed and also had transfusions).  In the ED, he was found to have ST segment  depression and  Negative cardia enzymes.  He was referred for medical admission.    Review of Systems: The patient denies anorexia, fever, weight loss, vision loss, decreased hearing, hoarseness,  syncope,  peripheral edema, balance deficits, hemoptysis, abdominal pain, melena, hematochezia, severe indigestion/heartburn, hematuria, incontinence, genital sores, suspicious skin lesions, transient blindness, difficulty walking, depression, unusual weight change, abnormal bleeding, enlarged lymph nodes, angioedema, and breast masses.    Past Medical History  Diagnosis Date  . Adenomatous colon polyp   . Diverticulosis   . GERD (gastroesophageal reflux disease)   . Hiatal hernia   . Alzheimer disease   . Gout     "hands; backbone; elbows; shoulders"  . Asthma with bronchitis   . COPD (chronic obstructive pulmonary disease)   . Chronic hypoxemic respiratory failure   . OSA (obstructive sleep apnea)   . CHF (congestive heart failure)   . Blood transfusion     "I've had about 6 pints" (12/31/2011)  . Pneumonia   . Anemia   . GI bleed   . Coronary artery disease   . Hypertension   . Peripheral vascular disease     PAD  . Dysrhythmia     "skips"  . Anginal  pain   . Myocardial infarction     "they said I had 2 light ones"  . Shortness of breath 12/31/2011    "all the time"  . Type II diabetes mellitus   . Arthritis     "plenty"  . Chronic lower back pain   . Renal insufficiency     "went on dialysis probably 3 times"  . Skin cancer     "forehead; arms"  . Depression   . Diverticulosis   . Colon polyps     adenomatous and hyperplastic  . GERD (gastroesophageal reflux disease)   . Hiatal hernia    Past Surgical History  Procedure Date  . Hand surgery 1970's    "chain saw accident; had to reattach fingers on left hand"  . Esophagogastroduodenoscopy 05/05/2011    Procedure: ESOPHAGOGASTRODUODENOSCOPY (EGD);  Surgeon: Louis Meckel, MD;  Location: [facility];  Service: Endoscopy;  Laterality: N/A;  . Flexible sigmoidoscopy 05/05/2011    Procedure: FLEXIBLE SIGMOIDOSCOPY;  Surgeon: Louis Meckel, MD;  Location: [facility];  Service: Endoscopy;  Laterality: N/A;  . Laparoscopic cholecystectomy ~ 2001  . Excisional hemorrhoidectomy 1950's  . Cataract extraction w/ intraocular lens  implant, bilateral   . Coronary artery bypass graft 1997    CABG X2  . Iliac artery stent 11/2011    left/H&P  . Renal artery stent 2010    left/H&P  . Iliac artery stent 11/2011    right  . Coronary angioplasty with stent placement 12/31/2011    "I  have 15 stents; heart, kidney, aorta, legs"  . Colonoscopy w/ biopsies and polypectomy     "have had probably 5 cut out" (12/31/2011)  . Enteroscopy 03/04/2012    Procedure: ENTEROSCOPY;  Surgeon: Louis Meckel, MD;  Location: [facility] ENDOSCOPY;  Service: Endoscopy;  Laterality: N/A;  jessica/leone    Medications:  HOME MEDS: Prior to Admission medications   Medication Sig Start Date End Date Taking? Authorizing Provider  acetaminophen (TYLENOL) 650 MG CR tablet Take 1,300 mg by mouth 2 (two) times daily.    Yes Historical Provider, MD  albuterol (PROAIR HFA) 108 (90 BASE) MCG/ACT inhaler Inhale 2 puffs  into the lungs every 4 (four) hours as needed for wheezing or shortness of breath. 10/08/11 10/07/12 Yes Clinton D Young, MD  albuterol (PROVENTIL) (2.5 MG/3ML) 0.083% nebulizer solution Take 2.5 mg by nebulization every 6 (six) hours as needed. For shortness of breath   Yes Historical Provider, MD  allopurinol (ZYLOPRIM) 300 MG tablet Take 300 mg by mouth 2 (two) times daily.   Yes Historical Provider, MD  amLODipine (NORVASC) 10 MG tablet Take 1 tablet (10 mg total) by mouth daily. 12/11/11 12/10/12 Yes Wilburt Finlay, PA  aspirin 325 MG tablet Take 325 mg by mouth daily.     Yes Historical Provider, MD  atorvastatin (LIPITOR) 20 MG tablet Take 20 mg by mouth at bedtime.   Yes Historical Provider, MD  bumetanide (BUMEX) 2 MG tablet Take 1 tablet (2 mg total) by mouth 2 (two) times daily. 01/03/12  Yes Abelino Derrick, PA  cloNIDine HCl (KAPVAY) 0.1 MG TB12 ER tablet Take 1 tablet (0.1 mg total) by mouth 2 (two) times daily. 12/11/11  Yes Wilburt Finlay, PA  colchicine 0.6 MG tablet Take 0.6 mg by mouth daily.    Yes Historical Provider, MD  escitalopram (LEXAPRO) 10 MG tablet Take 20 mg by mouth daily.    Yes Historical Provider, MD  ezetimibe (ZETIA) 10 MG tablet Take 10 mg by mouth daily.     Yes Historical Provider, MD  folic acid (FOLVITE) 400 MCG tablet Take 400 mcg by mouth daily.     Yes Historical Provider, MD  gabapentin (NEURONTIN) 100 MG tablet Take 100 mg by mouth at bedtime.     Yes Historical Provider, MD  insulin aspart (NOVOLOG) 100 UNIT/ML injection Inject 10-20 Units into the skin 3 (three) times daily before meals. 10 units in the morning, 20 units at noon and 20 units in the evening   Yes Historical Provider, MD  insulin glargine (LANTUS) 100 UNIT/ML injection Inject 57 Units into the skin 2 (two) times daily.    Yes Historical Provider, MD  isosorbide mononitrate (IMDUR) 30 MG 24 hr tablet Take 60 mg by mouth daily.  03/09/11  Yes Historical Provider, MD  lisinopril (PRINIVIL,ZESTRIL) 5 MG  tablet Take 1 tablet (5 mg total) by mouth daily. 03/11/12 03/11/13 Yes Marinda Elk, MD  metolazone (ZAROXOLYN) 5 MG tablet Take 5 mg by mouth every other day. 30 minutes before Bumex dose   Yes Historical Provider, MD  Multiple Vitamin (MULTIVITAMIN WITH MINERALS) TABS Take 0.5 tablets by mouth 2 (two) times daily.   Yes Historical Provider, MD  nitroGLYCERIN (NITROSTAT) 0.4 MG SL tablet Place 0.4 mg under the tongue every 5 (five) minutes as needed. May repeat x 3 for chest pain   Yes Historical Provider, MD  pantoprazole (PROTONIX) 40 MG tablet Take 40 mg by mouth daily.     Yes Historical Provider, MD  predniSONE (DELTASONE) 5 MG tablet Take 5 mg by mouth daily.     Yes Historical Provider, MD  tiotropium (SPIRIVA) 18 MCG inhalation capsule Place 18 mcg into inhaler and inhale daily.   04/21/11 04/20/12 Yes Waymon Budge, MD    Allergies:  Allergies  Allergen Reactions  . Donepezil Diarrhea  . Heparin Itching and Rash  . Hydralazine Other (See Comments)    DO NOT TAKE PER MD  . Hydromorphone Itching  . Morphine Itching  . Plavix (Clopidogrel Bisulfate) Other (See Comments)    GI BLEEDING; "so bad I had to take blood; dr took me off it"  . Promethazine Other (See Comments)    DO NOT TAKE PER MD  . Sulfonamide Derivatives Itching  . Zaroxolyn (Metolazone) Diarrhea  . Cefuroxime Other (See Comments)    REACTION: unkown reaction  . Metoprolol Other (See Comments)    unknown  . Ertapenem Itching and Rash    12/31/2011 pt does not recall allergy or severity     Social History:  reports that he quit smoking about 21 years ago. His smoking use included Cigarettes. He has a 112.5 pack-year smoking history. He quit smokeless tobacco use about 21 years ago. His smokeless tobacco use included Chew. He reports that he does not drink alcohol or use illicit drugs.   Family History  Problem Relation Age of Onset  . Lung cancer Brother   . Diabetes Mother   . Heart disease Sister     . Stomach cancer Maternal Grandfather   . Heart disease Father   . Heart disease Brother   . Arthritis Mother   . ALS Sister   . ALS      neice  . ALS      nephew  . Colon cancer Neg Hx     Physical Exam:  GEN:  Pleasant 78 year old Obese Caucasian Male examined  and in no acute distress; cooperative with exam Filed Vitals:   04/01/12 0030 04/01/12 0045 04/01/12 0115 04/01/12 0230  BP: 118/39 123/43 125/49 132/49  Pulse: 58 72 76 74  Temp:      TempSrc:      Resp: 20 16 16 20   SpO2: 98% 98% 95% 99%   Blood pressure 132/49, pulse 74, temperature 98.1 F (36.7 C), temperature source Oral, resp. rate 20, SpO2 99.00%. PSYCH: He is alert and oriented x4; does not appear anxious does not appear depressed; affect is normal HEENT: Normocephalic and Atraumatic, Mucous membranes pink; PERRLA; EOM intact; Fundi:  Benign;  No scleral icterus, Nares: Patent, Oropharynx: Clear, Neck:  FROM, no cervical lymphadenopathy nor thyromegaly or carotid bruit; no JVD; Breasts:: Not examined CHEST WALL: No tenderness CHEST: Normal respiration, clear to auscultation bilaterally HEART: Regular rate and rhythm; no murmurs rubs or gallops BACK: No kyphosis or scoliosis; no CVA tenderness ABDOMEN: Positive Bowel Sounds, Obese, soft non-tender; no masses, no organomegaly, no pannus; no intertriginous candida. Rectal Exam: Not done EXTREMITIES: No bone or joint deformity; age-appropriate arthropathy of the hands and knees; no cyanosis, clubbing or edema; no ulcerations. Genitalia: not examined PULSES: 2+ and symmetric SKIN: Normal hydration no rash or ulceration CNS: Cranial nerves 2-12 grossly intact no focal neurologic deficit     Labs on Admission:  Basic Metabolic Panel:  Lab 03/31/12 1610  NA 136  K 4.6  CL 99  CO2 21  GLUCOSE 110*  BUN 131*  CREATININE 4.35*  CALCIUM 9.3  MG --  PHOS --   Liver Function  Tests:  Lab 03/31/12 2303  AST 23  ALT 16  ALKPHOS 57  BILITOT 0.2*  PROT  6.9  ALBUMIN 3.9    Lab 03/31/12 2341  LIPASE 127*  AMYLASE --   No results found for this basename: AMMONIA:5 in the last 168 hours CBC:  Lab 03/31/12 2303 03/30/12 1145  WBC 12.0* 10.1  NEUTROABS 8.8* 8.1*  HGB 8.9* 8.9 Repeated and verified X2.*  HCT 28.3* 28.2*  MCV 86.8 89.0  PLT 223 234.0   Cardiac Enzymes:  Lab 03/31/12 2341  CKTOTAL --  CKMB --  CKMBINDEX --  TROPONINI <0.30    BNP (last 3 results)  Basename 03/03/12 2128 01/02/12 1130 01/02/12 0550  PROBNP 509.1* 945.2* 1007.0*   CBG: No results found for this basename: GLUCAP:5 in the last 168 hours  Radiological Exams on Admission: Dg Chest 2 View  04/01/2012  *RADIOLOGY REPORT*  Clinical Data: Chest pain and burning; nausea and diarrhea.  CHEST - 2 VIEW  Comparison: Chest radiograph performed 03/03/2012  Findings: The lungs are well-aerated.  Mild vascular congestion is noted.  There is no evidence of focal opacification, pleural effusion or pneumothorax.  Mild bilateral pleural thickening is again noted.  The heart is mildly enlarged; the patient is status post median sternotomy.  No acute osseous abnormalities are seen.  Focal density overlying the lower thoracic spine likely reflects a bone island.  IMPRESSION: Mild vascular congestion and mild cardiomegaly noted; lungs remain grossly clear.   Original Report Authenticated By: Tonia Ghent, M.D.     WRU:EAVWU Rhythm with Nonspecific changes  And ST depression in the Inferior Leads     Assessment: Principal Problem:  *Chest pain Active Problems:  Nausea and vomiting  DM  PVD, widespread, ISR bilat Iliac stents, re stented 12/11/11  HTN (hypertension)  HLD (hyperlipidemia)  Acute on chronic renal insufficiency, baseline Scr 1.6  Obesity  Acute posthemorrhagic anemia  CAD (coronary artery disease)  Chronic diastolic CHF (congestive heart failure)   Plan:            Admit to Telemetry Bed Enteric Contact Precautions Send Stool for C+S, and  C.Diff Anti-Emetics PRN Clear liquids Cardiac Enzymes Gentle IVFs, and Monitor Bun/Cr Hold Ace inhibitor, and Metformin Rx due to Increased BUN/CR Monitor Lipase levels SSI coverage Prn Reconcile Home Medications DVT Prophylaxis with SCDs    Code Status:  FULLCODE Family Communication: Wife and Son at Bedside Disposition Plan:    Return to Home  Time spent: 54 Minutes   Ron Parker [facility] Pager [phone]       If 7PM-7AM, please contact night-coverage [email or web address] Password [email or web address] 04/01/2012, 3:25 AM

## 2012-04-01 NOTE — ED Notes (Signed)
Patient transported to X-ray 

## 2012-04-01 NOTE — Progress Notes (Signed)
Subjective: Patient admitted with nausea vomiting and diarrhea, he was found to be in acute renal failure. ACE inhibitor and metformin were held. Patient was started on fluids. Currently he denies any pain. Filed Vitals:   04/01/12 1438  BP: 119/40  Pulse: 73  Temp: 98 F (36.7 C)  Resp: 18    Chest: Clear Bilaterally Heart : S1S2 RRR Abdomen: Soft, nontender Ext : No edema Neuro: Alert, oriented x 3  A/P Acute renal failure Will continue with IV fluids, will check the BMP in the morning  Nausea vomiting Continue antiemetics  Diabetes mellitus Continue since insulin     Meredeth Ide [facility] [phone]

## 2012-04-01 NOTE — Progress Notes (Signed)
INITIAL ADULT NUTRITION ASSESSMENT Date: 04/01/2012   Time: 12:49 PM Reason for Assessment: MST (Malnutrition Screening Tool)   INTERVENTION: 1. RD to monitor PO intake   DOCUMENTATION CODES Per approved criteria  -Obesity Unspecified    ASSESSMENT: Male 78 y.o.  Dx: Chest pain  Hx:  Past Medical History  Diagnosis Date  . Adenomatous colon polyp   . Diverticulosis   . GERD (gastroesophageal reflux disease)   . Hiatal hernia   . Alzheimer disease   . Gout     "hands; backbone; elbows; shoulders"  . Asthma with bronchitis   . COPD (chronic obstructive pulmonary disease)   . Chronic hypoxemic respiratory failure   . OSA (obstructive sleep apnea)   . CHF (congestive heart failure)   . Blood transfusion     "I've had about 6 pints" (12/31/2011)  . Pneumonia   . Anemia   . GI bleed   . Coronary artery disease   . Hypertension   . Peripheral vascular disease     PAD  . Dysrhythmia     "skips"  . Anginal pain   . Myocardial infarction     "they said I had 2 light ones"  . Shortness of breath 12/31/2011    "all the time"  . Type II diabetes mellitus   . Arthritis     "plenty"  . Chronic lower back pain   . Renal insufficiency     "went on dialysis probably 3 times"  . Skin cancer     "forehead; arms"  . Depression   . Diverticulosis   . Colon polyps     adenomatous and hyperplastic  . GERD (gastroesophageal reflux disease)   . Hiatal hernia     Past Surgical History  Procedure Date  . Hand surgery 1970's    "chain saw accident; had to reattach fingers on left hand"  . Esophagogastroduodenoscopy 05/05/2011    Procedure: ESOPHAGOGASTRODUODENOSCOPY (EGD);  Surgeon: Louis Meckel, MD;  Location: [facility];  Service: Endoscopy;  Laterality: N/A;  . Flexible sigmoidoscopy 05/05/2011    Procedure: FLEXIBLE SIGMOIDOSCOPY;  Surgeon: Louis Meckel, MD;  Location: [facility];  Service: Endoscopy;  Laterality: N/A;  . Laparoscopic cholecystectomy ~ 2001  .  Excisional hemorrhoidectomy 1950's  . Cataract extraction w/ intraocular lens  implant, bilateral   . Coronary artery bypass graft 1997    CABG X2  . Iliac artery stent 11/2011    left/H&P  . Renal artery stent 2010    left/H&P  . Iliac artery stent 11/2011    right  . Coronary angioplasty with stent placement 12/31/2011    "I have 15 stents; heart, kidney, aorta, legs"  . Colonoscopy w/ biopsies and polypectomy     "have had probably 5 cut out" (12/31/2011)  . Enteroscopy 03/04/2012    Procedure: ENTEROSCOPY;  Surgeon: Louis Meckel, MD;  Location: [facility] ENDOSCOPY;  Service: Endoscopy;  Laterality: N/A;  jessica/leone    Related Meds:     . sodium chloride   Intravenous STAT  . allopurinol  300 mg Oral BID  . amLODipine  10 mg Oral Daily  . aspirin  325 mg Oral Daily  . atorvastatin  20 mg Oral QHS  . cloNIDine HCl  0.1 mg Oral BID  . colchicine  0.6 mg Oral Daily  . escitalopram  20 mg Oral Daily  . ezetimibe  10 mg Oral Daily  . folic acid  400 mcg Oral Daily  . gabapentin  100 mg Oral  QHS  . insulin aspart  0-9 Units Subcutaneous Q4H  . insulin glargine  57 Units Subcutaneous BID  . isosorbide mononitrate  60 mg Oral Daily  . multivitamin with minerals  1 tablet Oral Daily  . [COMPLETED] ondansetron (ZOFRAN) IV  4 mg Intravenous Once  . pantoprazole (PROTONIX) IV  40 mg Intravenous Q24H  . predniSONE  5 mg Oral Q breakfast  . [COMPLETED] sodium chloride  1,000 mL Intravenous Once  . sodium chloride  3 mL Intravenous Q12H  . tiotropium  18 mcg Inhalation Daily  . [DISCONTINUED] multivitamin with minerals  1 tablet Oral BID     Ht: 5\' 7"  (170.2 cm)  Wt: 231 lb 14.8 oz (105.2 kg) (b scale)  Ideal Wt: 67.3 kg  % Ideal Wt: 156%  Usual Wt:  Wt Readings from Last 5 Encounters:  04/01/12 231 lb 14.8 oz (105.2 kg)  03/30/12 236 lb (107.049 kg)  03/04/12 251 lb 12.3 oz (114.2 kg)  03/04/12 251 lb 12.3 oz (114.2 kg)  01/07/12 249 lb 6.4 oz (113.127 kg)    % Usual Wt:  92%  Body mass index is 36.32 kg/(m^2). pt is obese class 2 per current BMI   Food/Nutrition Related Hx: Pt reports increase nausea, decreased intake and weight loss PTA  Labs:  CMP     Component Value Date/Time   NA 135 04/01/2012 0317   K 4.4 04/01/2012 0317   CL 100 04/01/2012 0317   CO2 22 04/01/2012 0317   GLUCOSE 110* 04/01/2012 0317   BUN 128* 04/01/2012 0317   CREATININE 4.25* 04/01/2012 0317   CALCIUM 8.7 04/01/2012 0317   CALCIUM 7.9* 08/09/2008 0350   PROT 6.9 03/31/2012 2303   ALBUMIN 3.9 03/31/2012 2303   AST 23 03/31/2012 2303   ALT 16 03/31/2012 2303   ALKPHOS 57 03/31/2012 2303   BILITOT 0.2* 03/31/2012 2303   GFRNONAA 12* 04/01/2012 0317   GFRAA 14* 04/01/2012 0317      Intake/Output Summary (Last 24 hours) at 04/01/12 1253 Last data filed at 04/01/12 1140  Gross per 24 hour  Intake    713 ml  Output    825 ml  Net   -112 ml     Diet Order: Clear Liquid  Supplements/Tube Feeding: none   IVF:    sodium chloride Last Rate: 50 mL/hr at 04/01/12 0451    Estimated Nutritional Needs:   Kcal: 2000-2200 Protein: 70-80 gm  Fluid: 2-2.2 L   Pt admitted with N/V/D. Pt states that this problems started when he had a recent "bleed" and had GI procedure.  When asked about weight loss he reports he was trying to lose weight, family states he has not had a good appetite. Eating smaller portions.  Also thinks that the nausea may have started when one of his medications was recently increased.  Weight hx shows 20 lbs (~8% body weight) weight loss in the past one month. Pt with CHF, question if some weight loss r/t fluids.  Encouraged pt to continue to monitor weight loss. Some weight loss would be beneficial for this pt, but recommended not to lose more then 2 lbs per week. RD will monitor pt intake for appropriateness and add nutrition supplements as needed.   NUTRITION DIAGNOSIS: -Inadequate oral intake (NI-2.1).  Status: Ongoing  RELATED TO: poor appetite, N/V  AS  EVIDENCE BY: weight loss   MONITORING/EVALUATION(Goals): Goal: PO intake to meet >/=90% estimated nutrition needs  Monitor: PO intake, weight, labs, i/o's  EDUCATION  NEEDS: -No education needs identified at this time    Clarene Duke RD, LDN Pager [phone] After Hours pager [phone]  04/01/2012, 12:49 PM

## 2012-04-01 NOTE — Progress Notes (Signed)
Utilization review completed.  

## 2012-04-02 DIAGNOSIS — M109 Gout, unspecified: Secondary | ICD-10-CM

## 2012-04-02 DIAGNOSIS — I2510 Atherosclerotic heart disease of native coronary artery without angina pectoris: Secondary | ICD-10-CM

## 2012-04-02 LAB — GLUCOSE, CAPILLARY
Glucose-Capillary: 111 mg/dL — ABNORMAL HIGH (ref 70–99)
Glucose-Capillary: 135 mg/dL — ABNORMAL HIGH (ref 70–99)
Glucose-Capillary: 142 mg/dL — ABNORMAL HIGH (ref 70–99)
Glucose-Capillary: 148 mg/dL — ABNORMAL HIGH (ref 70–99)
Glucose-Capillary: 86 mg/dL (ref 70–99)

## 2012-04-02 LAB — BASIC METABOLIC PANEL
BUN: 106 mg/dL — ABNORMAL HIGH (ref 6–23)
Calcium: 9.2 mg/dL (ref 8.4–10.5)
Chloride: 103 mEq/L (ref 96–112)
Creatinine, Ser: 3.05 mg/dL — ABNORMAL HIGH (ref 0.50–1.35)
GFR calc Af Amer: 21 mL/min — ABNORMAL LOW (ref >90)

## 2012-04-02 LAB — CBC
HCT: 26.9 % — ABNORMAL LOW (ref 39.0–52.0)
MCHC: 31.2 g/dL (ref 30.0–36.0)
Platelets: 208 10*3/uL (ref 150–400)
RDW: 17.8 % — ABNORMAL HIGH (ref 11.5–15.5)

## 2012-04-02 MED ORDER — GLUCOSE 40 % PO GEL
ORAL | Status: AC
  Administered 2012-04-02: 37.5 g
  Filled 2012-04-02: qty 1

## 2012-04-02 MED ORDER — COLCHICINE 0.6 MG PO TABS
0.6000 mg | ORAL_TABLET | ORAL | Status: DC
Start: 2012-04-04 — End: 2012-04-04
  Administered 2012-04-04: 0.6 mg via ORAL
  Filled 2012-04-02: qty 1

## 2012-04-02 MED ORDER — INSULIN GLARGINE 100 UNIT/ML ~~LOC~~ SOLN
30.0000 [IU] | Freq: Two times a day (BID) | SUBCUTANEOUS | Status: DC
  Administered 2012-04-02 – 2012-04-04 (×5): 30 [IU] via SUBCUTANEOUS

## 2012-04-02 MED ORDER — DEXTROSE 50 % IV SOLN: 25.0000 mL | Freq: Once | INTRAVENOUS | Status: AC | PRN

## 2012-04-02 MED ORDER — ALLOPURINOL 300 MG PO TABS
300.0000 mg | ORAL_TABLET | Freq: Every day | ORAL | Status: DC
  Administered 2012-04-03 – 2012-04-04 (×2): 300 mg via ORAL
  Filled 2012-04-02 (×2): qty 1

## 2012-04-02 MED ORDER — GLUCOSE 40 % PO GEL: 1.0000 | ORAL | Status: DC | PRN

## 2012-04-02 MED ORDER — DEXTROSE 50 % IV SOLN
INTRAVENOUS | Status: AC
  Administered 2012-04-02: 05:00:00
  Filled 2012-04-02: qty 50

## 2012-04-02 NOTE — Progress Notes (Signed)
Hypoglycemic Event  CBG: 44  Treatment: 15 GM gel @0512   Symptoms: Sweaty  Follow-up CBG: Time:0521 CBG Result:57  Possible Reasons for Event: Unknown  Comments/MD notified:    Benay Pike

## 2012-04-02 NOTE — Progress Notes (Signed)
Hypoglycemic Event  CBG: 57  Treatment: D50 IV 25 mL  Symptoms: None  Follow-up CBG: Time:0535 CBG Result:148  Possible Reasons for Event: Unknown  Comments/MD notified:    Benay Pike

## 2012-04-02 NOTE — Progress Notes (Addendum)
Subjective: Patient seen, very drowsy this morning, his blood sugar dropped to 41 this morning as patient is taking Lantus 57 units twice a day. He denies any diarrhea stool for C. difficile pending. Patient's renal function shows improvement with taking down to 3.05 and BUN is 106.  Objective: Vital signs in last 24 hours: Temp:  [97.4 F (36.3 C)-98 F (36.7 C)] 97.8 F (36.6 C) (11/09 0543) Pulse Rate:  [65-73] 65  (11/09 0543) Resp:  [18-19] 19  (11/09 0543) BP: (110-123)/(36-61) 110/36 mmHg (11/09 0543) SpO2:  [97 %-99 %] 97 % (11/09 0543) Weight:  [105 kg (231 lb 7.7 oz)] 105 kg (231 lb 7.7 oz) (11/09 0543) Weight change: -0.2 kg (-7.1 oz) Last BM Date: 03/31/12  Consults: None  Procedures: None  Intake/Output from previous day: 11/08 0701 - 11/09 0700 In: 1903 [P.O.:1900; I.V.:3] Out: 1550 [Urine:1550] Total I/O In: 360 [P.O.:360] Out: 350 [Urine:350]   Physical Exam: Head: Normocephalic, atraumatic.  Eyes: No signs of jaundice, EOMI Nose: Mucous membranes dry.  Neck: supple,No deformities, masses, or tenderness noted. Lungs: Normal respiratory effort. B/L Clear to auscultation, no crackles or wheezes.  Heart: Regular RR. S1 and S2 normal  Abdomen: BS normoactive. Soft, Nondistended, non-tender.  Extremities: No pretibial edema, no erythema   Lab Results: Basic Metabolic Panel:  Basename 04/02/12 0520 04/01/12 0317  NA 139 135  K 4.1 4.4  CL 103 100  CO2 24 22  GLUCOSE 41* 110*  BUN 106* 128*  CREATININE 3.05* 4.25*  CALCIUM 9.2 8.7  MG -- --  PHOS -- --   Liver Function Tests:  [facility] 03/31/12 2303  AST 23  ALT 16  ALKPHOS 57  BILITOT 0.2*  PROT 6.9  ALBUMIN 3.9    Basename 03/31/12 2341  LIPASE 127*  AMYLASE --   No results found for this basename: AMMONIA:2 in the last 72 hours CBC:  Basename 04/02/12 0520 04/01/12 0317 03/31/12 2303 03/30/12 1145  WBC 8.6 8.2 -- --  NEUTROABS -- -- 8.8* 8.1*  HGB 8.4* 8.3* -- --  HCT 26.9*  26.0* -- --  MCV 87.9 88.1 -- --  PLT 208 193 -- --   Cardiac Enzymes:  Basename 04/01/12 1517 04/01/12 1000 04/01/12 0317  CKTOTAL -- -- 227  CKMB -- -- 6.2*  CKMBINDEX -- -- --  TROPONINI <0.30 <0.30 <0.30     Basename 04/02/12 0535 04/02/12 0521 04/02/12 0506 04/02/12 0003 04/01/12 1955 04/01/12 1622  GLUCAP 148* 57* 44* 86 111* 123*   Hemoglobin A1C:  Basename 04/01/12 0317  HGBA1C 6.2*   Fasting Lipid Panel: No results found for this basename: CHOL,HDL,LDLCALC,TRIG,CHOLHDL,LDLDIRECT in the last 72 hours Thyroid Function Tests: No results found for this basename: TSH,T4TOTAL,FREET4,T3FREE,THYROIDAB in the last 72 hours Anemia Panel:  Basename 03/30/12 1145  VITAMINB12 640  FOLATE >24.8  FERRITIN 11.6*  TIBC --  IRON 27*  RETICCTPCT --   Urinalysis:  Basename 04/01/12 0214  COLORURINE YELLOW  LABSPEC 1.014  PHURINE 5.0  GLUCOSEU NEGATIVE  HGBUR NEGATIVE  BILIRUBINUR NEGATIVE  KETONESUR NEGATIVE  PROTEINUR NEGATIVE  UROBILINOGEN 0.2  NITRITE NEGATIVE  LEUKOCYTESUR NEGATIVE    Recent Results (from the past 240 hour(s))  MRSA PCR SCREENING     Status: Normal   Collection Time   04/01/12  4:41 AM      Component Value Range Status Comment   MRSA by PCR NEGATIVE  NEGATIVE Final     Studies/Results: Dg Chest 2 View  04/01/2012  *RADIOLOGY REPORT*  Clinical Data: Chest pain and burning; nausea and diarrhea.  CHEST - 2 VIEW  Comparison: Chest radiograph performed 03/03/2012  Findings: The lungs are well-aerated.  Mild vascular congestion is noted.  There is no evidence of focal opacification, pleural effusion or pneumothorax.  Mild bilateral pleural thickening is again noted.  The heart is mildly enlarged; the patient is status post median sternotomy.  No acute osseous abnormalities are seen.  Focal density overlying the lower thoracic spine likely reflects a bone island.  IMPRESSION: Mild vascular congestion and mild cardiomegaly noted; lungs remain grossly  clear.   Original Report Authenticated By: Tonia Ghent, M.D.     Medications: Scheduled Meds:   . [COMPLETED] sodium chloride   Intravenous STAT  . allopurinol  300 mg Oral BID  . amLODipine  10 mg Oral Daily  . aspirin  325 mg Oral Daily  . atorvastatin  20 mg Oral QHS  . cloNIDine HCl  0.1 mg Oral BID  . colchicine  0.6 mg Oral Daily  . [COMPLETED] dextrose      . [COMPLETED] dextrose      . escitalopram  20 mg Oral Daily  . ezetimibe  10 mg Oral Daily  . folic acid  400 mcg Oral Daily  . gabapentin  100 mg Oral QHS  . insulin aspart  0-9 Units Subcutaneous Q4H  . insulin glargine  30 Units Subcutaneous BID  . isosorbide mononitrate  60 mg Oral Daily  . multivitamin with minerals  1 tablet Oral Daily  . pantoprazole (PROTONIX) IV  40 mg Intravenous Q24H  . predniSONE  5 mg Oral Q breakfast  . sodium chloride  3 mL Intravenous Q12H  . tiotropium  18 mcg Inhalation Daily  . [DISCONTINUED] insulin glargine  57 Units Subcutaneous BID   Continuous Infusions:   . sodium chloride 75 mL/hr at 04/01/12 1714   PRN Meds:.acetaminophen, acetaminophen, albuterol, albuterol, alum & mag hydroxide-simeth, dextrose, dextrose, nitroGLYCERIN, ondansetron (ZOFRAN) IV, ondansetron, zolpidem    Principal Problem:  *Chest pain Active Problems:  DM  PVD, widespread, ISR bilat Iliac stents, re stented 12/11/11  HTN (hypertension)  HLD (hyperlipidemia)  Acute on chronic renal insufficiency, baseline Scr 1.6  Obesity  Acute posthemorrhagic anemia  Nausea and vomiting  CAD (coronary artery disease)  Chronic diastolic CHF (congestive heart failure)  Chest pain Patient's cardiac enzymes are negative, chest pain has resolved but he does have mild cough. Chest x-ray shows mild vascular congestion. His 2-D echo shows grade 2 diastolic dysfunction with EF 55-60%.  Nausea vomiting Resolved at this time, we'll continue when necessary antiemetics  Diarrhea Resolved, C. difficile is  pending  Acute renal failure Patient's creatinine and BUN are improving with gentle IV fluids. We'll have to be careful with IV hydration as patient has grade 2 diastolic dysfunction. We'll check the patient's labs in the morning  Diabetes mellitus Patient was hypoglycemic in the morning, he is currently on liquid diet. Weight change the Lantus to 30 units twice a day along with sliding scale insulin  Gout Patient has been taking by mouth prednisone for gout for long time  Hypertension Patient will continue amlodipine clonidine BP is controlled  DVT prophylaxis SCDs    LOS: 2 days  Assessment/Plan: [facility] Pager: [phone] 04/02/2012, 9:00 AM

## 2012-04-03 ENCOUNTER — Observation Stay (HOSPITAL_COMMUNITY): Payer: BC Managed Care – PPO

## 2012-04-03 DIAGNOSIS — E860 Dehydration: Secondary | ICD-10-CM

## 2012-04-03 LAB — GLUCOSE, CAPILLARY
Glucose-Capillary: 153 mg/dL — ABNORMAL HIGH (ref 70–99)
Glucose-Capillary: 121 mg/dL — ABNORMAL HIGH (ref 70–99)
Glucose-Capillary: 91 mg/dL (ref 70–99)
Glucose-Capillary: 195 mg/dL — ABNORMAL HIGH (ref 70–99)
Glucose-Capillary: 142 mg/dL — ABNORMAL HIGH (ref 70–99)

## 2012-04-03 LAB — BASIC METABOLIC PANEL
Calcium: 9.3 mg/dL (ref 8.4–10.5)
Calcium: 9.3 mg/dL (ref 8.4–10.5)
Chloride: 109 mEq/L (ref 96–112)
Creatinine, Ser: 1.93 mg/dL — ABNORMAL HIGH (ref 0.50–1.35)
GFR calc Af Amer: 34 mL/min — ABNORMAL LOW (ref >90)
GFR calc Af Amer: 37 mL/min — ABNORMAL LOW (ref >90)
GFR calc non Af Amer: 29 mL/min — ABNORMAL LOW (ref >90)
Glucose, Bld: 83 mg/dL (ref 70–99)
Potassium: 4.6 mEq/L (ref 3.5–5.1)
Sodium: 143 mEq/L (ref 135–145)
Sodium: 143 mEq/L (ref 135–145)

## 2012-04-03 LAB — CBC
MCH: 27 pg (ref 26.0–34.0)
Platelets: 190 10*3/uL (ref 150–400)
RBC: 2.96 MIL/uL — ABNORMAL LOW (ref 4.22–5.81)

## 2012-04-03 MED ORDER — DEXTROSE 50 % IV SOLN
INTRAVENOUS | Status: AC
  Administered 2012-04-03: 1
  Filled 2012-04-03: qty 50

## 2012-04-03 MED ORDER — PANTOPRAZOLE SODIUM 40 MG PO TBEC
40.0000 mg | DELAYED_RELEASE_TABLET | Freq: Every day | ORAL | Status: DC
  Administered 2012-04-04: 40 mg via ORAL
  Filled 2012-04-03: qty 1

## 2012-04-03 MED ORDER — DEXTROSE 50 % IV SOLN: 50.0000 mL | Freq: Once | INTRAVENOUS | Status: AC | PRN

## 2012-04-03 MED ORDER — INSULIN ASPART 100 UNIT/ML ~~LOC~~ SOLN
0.0000 [IU] | Freq: Three times a day (TID) | SUBCUTANEOUS | Status: DC
  Administered 2012-04-03: 2 [IU] via SUBCUTANEOUS
  Administered 2012-04-03: 1 [IU] via SUBCUTANEOUS

## 2012-04-03 NOTE — Progress Notes (Signed)
Subjective: Patient seen, very drowsy this morning, his blood sugar dropped to 41 this morning as patient is taking Lantus 57 units twice a day. He denies any diarrhea stool for C. difficile pending. Patient's renal function shows improvement with taking down to 2.10and BUN is 79. No bowel movement [facility]  Objective: Vital signs in last 24 hours: Temp:  [97.3 F (36.3 C)-98 F (36.7 C)] 97.8 F (36.6 C) (11/10 0540) Pulse Rate:  [64-72] 66  (11/10 0540) Resp:  [19-20] 20  (11/10 0540) BP: (134-140)/(41-61) 138/56 mmHg (11/10 0945) SpO2:  [96 %-100 %] 98 % (11/10 0850) Weight:  [104.2 kg (229 lb 11.5 oz)] 104.2 kg (229 lb 11.5 oz) (11/10 0540) Weight change: -0.8 kg (-1 lb 12.2 oz) Last BM Date: 03/31/12  Consults: None  Procedures: None  Intake/Output from previous day: 11/09 0701 - 11/10 0700 In: 1740 [P.O.:840; I.V.:900] Out: 1725 [Urine:1725] Total I/O In: 360 [P.O.:360] Out: 675 [Urine:675]   Physical Exam: Head: Normocephalic, atraumatic.  Eyes: No signs of jaundice, EOMI Nose: Mucous membranes dry.  Neck: supple,No deformities, masses, or tenderness noted. Lungs: Normal respiratory effort. B/L Clear to auscultation, no crackles or wheezes.  Heart: Regular RR. S1 and S2 normal  Abdomen: BS normoactive. Soft, distended, non-tender.  Extremities: No pretibial edema, no erythema   Lab Results: Basic Metabolic Panel:  Basename 04/03/12 0610 04/02/12 0520  NA 143 139  K 4.6 4.1  CL 109 103  CO2 27 24  GLUCOSE 83 41*  BUN 79* 106*  CREATININE 2.10* 3.05*  CALCIUM 9.3 9.2  MG -- --  PHOS -- --   Liver Function Tests:  [facility] 03/31/12 2303  AST 23  ALT 16  ALKPHOS 57  BILITOT 0.2*  PROT 6.9  ALBUMIN 3.9    Basename 03/31/12 2341  LIPASE 127*  AMYLASE --   No results found for this basename: AMMONIA:2 in the last 72 hours CBC:  Basename 04/03/12 0610 04/02/12 0520 03/31/12 2303  WBC 7.9 8.6 --  NEUTROABS -- -- 8.8*    HGB 8.0* 8.4* --  HCT 26.3* 26.9* --  MCV 88.9 87.9 --  PLT 190 208 --   Cardiac Enzymes:  Basename 04/01/12 1517 04/01/12 1000 04/01/12 0317  CKTOTAL -- -- 227  CKMB -- -- 6.2*  CKMBINDEX -- -- --  TROPONINI <0.30 <0.30 <0.30     Basename 04/03/12 0405 04/03/12 0258 04/03/12 0244 04/03/12 0009 04/02/12 2044 04/02/12 1717  GLUCAP 121* 153* 32* 107* 142* 135*   Hemoglobin A1C:  Basename 04/01/12 0317  HGBA1C 6.2*   Fasting Lipid Panel: No results found for this basename: CHOL,HDL,LDLCALC,TRIG,CHOLHDL,LDLDIRECT in the last 72 hours Thyroid Function Tests: No results found for this basename: TSH,T4TOTAL,FREET4,T3FREE,THYROIDAB in the last 72 hours Anemia Panel: No results found for this basename: VITAMINB12,FOLATE,FERRITIN,TIBC,IRON,RETICCTPCT in the last 72 hours Urinalysis:  Basename 04/01/12 0214  COLORURINE YELLOW  LABSPEC 1.014  PHURINE 5.0  GLUCOSEU NEGATIVE  HGBUR NEGATIVE  BILIRUBINUR NEGATIVE  KETONESUR NEGATIVE  PROTEINUR NEGATIVE  UROBILINOGEN 0.2  NITRITE NEGATIVE  LEUKOCYTESUR NEGATIVE    Recent Results (from the past 240 hour(s))  MRSA PCR SCREENING     Status: Normal   Collection Time   04/01/12  4:41 AM      Component Value Range Status Comment   MRSA by PCR NEGATIVE  NEGATIVE Final     Studies/Results: No results found.  Medications: Scheduled Meds:    . allopurinol  300 mg Oral Daily  .  amLODipine  10 mg Oral Daily  . aspirin  325 mg Oral Daily  . atorvastatin  20 mg Oral QHS  . cloNIDine HCl  0.1 mg Oral BID  . colchicine  0.6 mg Oral QODAY  . [COMPLETED] dextrose      . escitalopram  20 mg Oral Daily  . ezetimibe  10 mg Oral Daily  . folic acid  400 mcg Oral Daily  . gabapentin  100 mg Oral QHS  . insulin aspart  0-9 Units Subcutaneous Q4H  . insulin glargine  30 Units Subcutaneous BID  . isosorbide mononitrate  60 mg Oral Daily  . multivitamin with minerals  1 tablet Oral Daily  . pantoprazole (PROTONIX) IV  40 mg  Intravenous Q24H  . predniSONE  5 mg Oral Q breakfast  . sodium chloride  3 mL Intravenous Q12H  . tiotropium  18 mcg Inhalation Daily  . [DISCONTINUED] allopurinol  300 mg Oral BID  . [DISCONTINUED] colchicine  0.6 mg Oral Daily   Continuous Infusions:    . sodium chloride 75 mL/hr at 04/03/12 0245   PRN Meds:.acetaminophen, acetaminophen, albuterol, albuterol, alum & mag hydroxide-simeth, dextrose, [EXPIRED] dextrose, dextrose, nitroGLYCERIN, ondansetron (ZOFRAN) IV, ondansetron, zolpidem    Principal Problem:  *Chest pain Active Problems:  DM  PVD, widespread, ISR bilat Iliac stents, re stented 12/11/11  HTN (hypertension)  HLD (hyperlipidemia)  Acute on chronic renal insufficiency, baseline Scr 1.6  Obesity  Acute posthemorrhagic anemia  Nausea and vomiting  CAD (coronary artery disease)  Chronic diastolic CHF (congestive heart failure)  Chest pain Patient's cardiac enzymes are negative, chest pain has resolved but he does have mild cough. Chest x-ray shows mild vascular congestion. His 2-D echo shows grade 2 diastolic dysfunction with EF 55-60%.  Nausea vomiting Resolved at this time, we'll continue when necessary antiemetics  Diarrhea Resolved, C. difficile is pending.the abdomen is distended so obtain x-ray of the abdomen.  Acute renal failure Patient's creatinine and BUN are improving with gentle IV fluids. We'll have to be careful with IV hydration as patient has grade 2 diastolic dysfunction. We'll check the patient's labs in the morning  Diabetes mellitus Patient was hypoglycemic in the morning, he is currently on liquid diet. We will change the Lantus to 30 units twice a day along with sliding scale insulin  Gout Patient has been taking by mouth prednisone for gout for long time  Hypertension Patient will continue amlodipine clonidine BP is controlled  DVT prophylaxis SCDs    LOS: 3 days  Assessment/Plan: [facility] Pager:  [phone] 04/03/2012, 10:32 AM

## 2012-04-03 NOTE — Plan of Care (Signed)
Problem: Phase I Progression Outcomes Goal: EF % per last Echo/documented,Core Reminder form on chart Outcome: Completed/Met Date Met:  04/03/12 12/2011 EF 60 - 65 %

## 2012-04-03 NOTE — Progress Notes (Signed)
Hypoglycemic Event  CBG:32  Treatment: D50 IV 50 mL  Symptoms: Shaky,   Follow-up CBG: Time: 0258 CBG Result:158  Possible Reasons for Event: Unknown  Comments/MD notified: Pt  Also asked for a 15 CHO snack.    [name]

## 2012-04-04 DIAGNOSIS — N189 Chronic kidney disease, unspecified: Secondary | ICD-10-CM

## 2012-04-04 DIAGNOSIS — N289 Disorder of kidney and ureter, unspecified: Secondary | ICD-10-CM

## 2012-04-04 LAB — BASIC METABOLIC PANEL
Calcium: 9.9 mg/dL (ref 8.4–10.5)
GFR calc Af Amer: 45 mL/min — ABNORMAL LOW (ref >90)
GFR calc non Af Amer: 38 mL/min — ABNORMAL LOW (ref >90)
Potassium: 4.6 mEq/L (ref 3.5–5.1)
Sodium: 144 mEq/L (ref 135–145)

## 2012-04-04 MED ORDER — FUROSEMIDE 40 MG PO TABS: 40.0000 mg | ORAL_TABLET | Freq: Every day | ORAL | Status: DC

## 2012-04-04 NOTE — Progress Notes (Signed)
Iv removed from patient's left hand. Catheter intact

## 2012-04-04 NOTE — Progress Notes (Signed)
IV d/c'd.  Tele d/c'd.  Pt d/c'd to home.  Home meds and d/c instructions have been discussed with pt.  Pt denies any questions or concerns at this time.  Pt leaving unit via wheelchair and appears in no acute distress. Kaushik Maul RN 

## 2012-04-04 NOTE — Discharge Summary (Signed)
Physician Discharge Summary  [name] ZOX:[id number] DOB: 1934/01/10 DOA: 03/31/2012  PCP: Kaleen Mask, MD  Admit date: 03/31/2012 Discharge date: 04/04/2012  Time spent: 35 minutes  Recommendations for Outpatient Follow-up:  1. Follow up cardiology in 4 weeks 2. Follow up PCP in one week to check blood work  Discharge Diagnoses:  Principal Problem:  *Chest pain Active Problems:  DM  PVD, widespread, ISR bilat Iliac stents, re stented 12/11/11  HTN (hypertension)  HLD (hyperlipidemia)  Acute on chronic renal insufficiency, baseline Scr 1.6  Obesity  Acute posthemorrhagic anemia  Nausea and vomiting  CAD (coronary artery disease)  Chronic diastolic CHF (congestive heart failure)   Discharge Condition: Stable  Diet recommendation: Low salt diet  Filed Weights   04/02/12 0543 04/03/12 0540 04/04/12 0509  Weight: 105 kg (231 lb 7.7 oz) 104.2 kg (229 lb 11.5 oz) 103.8 kg (228 lb 13.4 oz)    History of present illness:  [name] is a 78 y.o. male with multiple medical problems including CAD,PVD, DM2, who presents to the ED with complaints of nausea, vomiting and diarrhea which has gotten worse over the past 24 hours along with complaints of chest pain and chest tightness intermittently for the past 3 days. He reports having nausea and vomiting and diarrhea for the past 3 week since his GI procedure ( where he underwent cauterization for a GI bleed and also had transfusions). In the ED, he was found to have ST segment depression and Negative cardia enzymes. He was referred for medical admission.   Hospital Course:  Nausea vomiting Nausea vomiting resolved, patient did not have any more vomiting or diarrhea in the hospital, most likely viral gastroenteritis which resolved. Patient has been started on carbon modified diet and he is tolerating it well.  Acute renal failure Patient came an acute on chronic renal failure, he has been on Bumex 2 mg twice a day  along with Zaroxolyn 5 mg every other day which were stopped in the hospital. Patient was started on IV fluids and his creatinine and BUN improved. On the day of discharge the BUN is 56 and the creatinine is 1.66. I'm going to discontinue Bumex and Zaroxolyn due to renal failure and start the patient on Lasix 40 mg by mouth daily for his diastolic heart failure. He would require a repeat BMP in one week and follow up with his cardiologist in 4 weeks  Chronic diastolic heart failure Currently is compensated, did not have any shortness of breath despite giving IV fluids in the hospital his oxygen saturation has been more than 95%. Patient has been walking in the hallways without getting short of breath. At this time ago to cut down his diuretics as they're causing worsening of the renal failure and going to start him on Lasix 40 mg by mouth daily  Diabetes mellitus Patient was n.p.o. the hospital, so his Lantus was cut down to 30 units twice a day as he did have some episodes of hypoglycemia. At this time patient is back to regular diet and will resume his home regimen of Lantus. Patient says that he is very diligent in checking his blood sugar at home. He will follow up with his primary care provider to make adjustments in Lantus if needed  Anemia Patient is a history of JP in the past, his hemoglobin remained stable in the hospital. He'll follow up with his GI consultant as needed as outpatient  Chest pain Patient also complained of chest pain at  the time of admission. His cardiac enzymes x3 were negative. Patient is chest pain-free at this time. Most likely due to GERD and nausea vomiting which has resolved at this time.    Procedures:  none Consultations:   None  Discharge Exam: Filed Vitals:   04/03/12 0945 04/03/12 1348 04/03/12 2128 04/04/12 0509  BP: 138/56 151/55 152/49 158/62  Pulse:  72 74 77  Temp:  97.6 F (36.4 C) 98.4 F (36.9 C) 97.9 F (36.6 C)  TempSrc:  Oral Oral Oral    Resp:  19 20 20   Height:      Weight:    103.8 kg (228 lb 13.4 oz)  SpO2:  95% 97% 96%    General: appears in no acute distress Cardiovascular:  S1-S2 regular Respiratory: clear to auscultation bilaterally extremities: No edema  Abdomen: Soft nontender mild distention no organomegaly  Discharge Instructions  Discharge Orders    Future Appointments: Provider: Department: Dept Phone: Center:   04/11/2012 10:30 AM [facility] [phone] None   04/11/2012 10:45 AM Dava Najjar [facility] [phone] None   04/11/2012 11:00 AM Exie Parody, MD [facility] [phone] None   07/11/2012 9:30 AM Waymon Budge, MD [facility] [phone] None     Future Orders Please Complete By Expires   Diet - low sodium heart healthy      Increase activity slowly          Medication List     As of 04/04/2012  9:44 AM    STOP taking these medications         bumetanide 2 MG tablet   Commonly known as: BUMEX      metolazone 5 MG tablet   Commonly known as: ZAROXOLYN      TAKE these medications         acetaminophen 650 MG CR tablet   Commonly known as: TYLENOL   Take 1,300 mg by mouth 2 (two) times daily.      albuterol (2.5 MG/3ML) 0.083% nebulizer solution   Commonly known as: PROVENTIL   Take 2.5 mg by nebulization every 6 (six) hours as needed. For shortness of breath      albuterol 108 (90 BASE) MCG/ACT inhaler   Commonly known as: PROVENTIL HFA;VENTOLIN HFA   Inhale 2 puffs into the lungs every 4 (four) hours as needed for wheezing or shortness of breath.      allopurinol 300 MG tablet   Commonly known as: ZYLOPRIM   Take 300 mg by mouth 2 (two) times daily.      amLODipine 10 MG tablet   Commonly known as: NORVASC   Take 1 tablet (10 mg total) by mouth daily.      aspirin 325 MG tablet   Take 325 mg by mouth daily.       atorvastatin 20 MG tablet   Commonly known as: LIPITOR   Take 20 mg by mouth at bedtime.      cloNIDine HCl 0.1 MG Tb12 ER tablet   Commonly known as: KAPVAY   Take 1 tablet (0.1 mg total) by mouth 2 (two) times daily.      colchicine 0.6 MG tablet   Take 0.6 mg by mouth daily.      ezetimibe 10 MG tablet   Commonly known as: ZETIA   Take 10 mg by mouth daily.      folic acid 400  MCG tablet   Commonly known as: FOLVITE   Take 400 mcg by mouth daily.      furosemide 40 MG tablet   Commonly known as: LASIX   Take 1 tablet (40 mg total) by mouth daily.      gabapentin 100 MG tablet   Commonly known as: NEURONTIN   Take 100 mg by mouth at bedtime.      insulin aspart 100 UNIT/ML injection   Commonly known as: novoLOG   Inject 10-20 Units into the skin 3 (three) times daily before meals. 10 units in the morning, 20 units at noon and 20 units in the evening      isosorbide mononitrate 30 MG 24 hr tablet   Commonly known as: IMDUR   Take 60 mg by mouth daily.      LANTUS 100 UNIT/ML injection   Generic drug: insulin glargine   Inject 57 Units into the skin 2 (two) times daily.      lisinopril 5 MG tablet   Commonly known as: PRINIVIL,ZESTRIL   Take 1 tablet (5 mg total) by mouth daily.      multivitamin with minerals Tabs   Take 0.5 tablets by mouth 2 (two) times daily.      nitroGLYCERIN 0.4 MG SL tablet   Commonly known as: NITROSTAT   Place 0.4 mg under the tongue every 5 (five) minutes as needed. May repeat x 3 for chest pain      pantoprazole 40 MG tablet   Commonly known as: PROTONIX   Take 40 mg by mouth daily.      predniSONE 5 MG tablet   Commonly known as: DELTASONE   Take 5 mg by mouth daily.      tiotropium 18 MCG inhalation capsule   Commonly known as: SPIRIVA   Place 18 mcg into inhaler and inhale daily.      ASK your doctor about these medications         escitalopram 10 MG tablet   Commonly known as: LEXAPRO   Take 20 mg by mouth daily.             Follow-up Information    Follow up with HARDING,DAVID W, MD. In 4 weeks.   Contact information:   [address], [address] [place] [address] [phone]       Follow up with Kaleen Mask, MD. In 1 week.   Contact information:   [address] [place] [address] [phone]           The results of significant diagnostics from this hospitalization (including imaging, microbiology, ancillary and laboratory) are listed below for reference.    Significant Diagnostic Studies: Dg Chest 2 View  04/01/2012  *RADIOLOGY REPORT*  Clinical Data: Chest pain and burning; nausea and diarrhea.  CHEST - 2 VIEW  Comparison: Chest radiograph performed 03/03/2012  Findings: The lungs are well-aerated.  Mild vascular congestion is noted.  There is no evidence of focal opacification, pleural effusion or pneumothorax.  Mild bilateral pleural thickening is again noted.  The heart is mildly enlarged; the patient is status post median sternotomy.  No acute osseous abnormalities are seen.  Focal density overlying the lower thoracic spine likely reflects a bone island.  IMPRESSION: Mild vascular congestion and mild cardiomegaly noted; lungs remain grossly clear.   Original Report Authenticated By: Tonia Ghent, M.D.    Dg Abd 2 Views  04/03/2012  *RADIOLOGY REPORT*  Clinical Data: Abdominal distension.  ABDOMEN - 2 VIEW  Comparison: 08/04/2008  Findings: Exam demonstrates air filled nondilated loops of large and small bowel with a few scattered nonspecific air-fluid levels. There are surgical clips of the right upper quadrant and single surgical clip over the midline pelvis.  Bilateral iliac artery stents are present.  Lung bases are unremarkable.  There are degenerate changes of the spine and hips.  IMPRESSION: Nonspecific, nonobstructive bowel gas pattern.   Original Report Authenticated By: Elberta Fortis, M.D.     Microbiology: Recent Results  (from the past 240 hour(s))  MRSA PCR SCREENING     Status: Normal   Collection Time   04/01/12  4:41 AM      Component Value Range Status Comment   MRSA by PCR NEGATIVE  NEGATIVE Final      Labs: Basic Metabolic Panel:  Lab 04/04/12 2130 04/03/12 0900 04/03/12 0610 04/02/12 0520 04/01/12 0317  NA 144 143 143 139 135  K 4.6 4.6 4.6 4.1 4.4  CL 109 109 109 103 100  CO2 27 23 27 24 22   GLUCOSE 48* 75 83 41* 110*  BUN 56* 75* 79* 106* 128*  CREATININE 1.66* 1.93* 2.10* 3.05* 4.25*  CALCIUM 9.9 9.3 9.3 9.2 8.7  MG -- -- -- -- --  PHOS -- -- -- -- --   Liver Function Tests:  Lab 03/31/12 2303  AST 23  ALT 16  ALKPHOS 57  BILITOT 0.2*  PROT 6.9  ALBUMIN 3.9    Lab 03/31/12 2341  LIPASE 127*  AMYLASE --   No results found for this basename: AMMONIA:5 in the last 168 hours CBC:  Lab 04/03/12 0610 04/02/12 0520 04/01/12 0317 03/31/12 2303 03/30/12 1145  WBC 7.9 8.6 8.2 12.0* 10.1  NEUTROABS -- -- -- 8.8* 8.1*  HGB 8.0* 8.4* 8.3* 8.9* 8.9 Repeated and verified X2.*  HCT 26.3* 26.9* 26.0* 28.3* 28.2*  MCV 88.9 87.9 88.1 86.8 89.0  PLT 190 208 193 223 234.0   Cardiac Enzymes:  Lab 04/01/12 1517 04/01/12 1000 04/01/12 0317 03/31/12 2341  CKTOTAL -- -- 227 --  CKMB -- -- 6.2* --  CKMBINDEX -- -- -- --  TROPONINI <0.30 <0.30 <0.30 <0.30   BNP: BNP (last 3 results)  Basename 03/03/12 2128 01/02/12 1130 01/02/12 0550  PROBNP 509.1* 945.2* 1007.0*   CBG:  Lab 04/03/12 2114 04/03/12 1559 04/03/12 0852 04/03/12 0405 04/03/12 0258  GLUCAP 142* 195* 91 121* 153*       Signed:  Kathlyne Loud S  [facility] 04/04/2012, 9:44 AM

## 2012-04-11 ENCOUNTER — Telehealth: Payer: Self-pay | Admitting: Oncology

## 2012-04-11 ENCOUNTER — Ambulatory Visit: Payer: BC Managed Care – PPO | Admitting: Oncology

## 2012-04-11 ENCOUNTER — Other Ambulatory Visit: Payer: BC Managed Care – PPO | Admitting: Lab

## 2012-04-11 ENCOUNTER — Ambulatory Visit: Payer: BC Managed Care – PPO

## 2012-04-11 NOTE — Progress Notes (Signed)
Patient called to cancel visit due to fever, SOB.  He will present to PCP.  Reschedule prn.

## 2012-04-11 NOTE — Telephone Encounter (Signed)
Per 11/18 pof pt not feeling well today and will call back to r/s appt. New pt - info given to Tiffany.

## 2012-04-12 ENCOUNTER — Telehealth: Payer: Self-pay | Admitting: *Deleted

## 2012-04-12 ENCOUNTER — Telehealth: Payer: Self-pay

## 2012-04-12 NOTE — Telephone Encounter (Signed)
Call received from Tammy reporting this "mutual patient had a hgb check today that = 7.5.  Dr. Jeannetta Nap would like Dr. Gaylyn Rong or the on call provider to know that in October the hgb = 9.8 and patient needs transfusion.  He is s.o.b. "  Noted patient cancelled new patient visit on yesterday.  This nurse informed Tammy that this office cannot transfuse and to try Medical Day or Short Stay at [facility] or [facility].  Tammy asked what provider to list patient under.  Instructed to place under Dr. Jeannetta Nap.  Tammy will notify Dr. Jeannetta Nap.  Call Ended.  Patient also may go to ER if needed.

## 2012-04-12 NOTE — Telephone Encounter (Signed)
Left a message with Dr. Lodema Pilot scheduler regarding rescheduling pt's cancelled appointment due to illness.

## 2012-04-12 NOTE — Telephone Encounter (Signed)
Got a call from Tammy at Dr Levon Hedger office. She said Pt was to have an appt at Dr Lodema Pilot office [facility]) on Mon 11-18/ He cancelled it due to having a fever and not feeling well. He went to Dr. Hennie Duos office instead.  Tammy said Dr. Marina Goodell wanted the pt to see Dr. Gaylyn Rong.  Verlon Au called Dr. Lodema Pilot office today 11-19 and left a message for them to call back so we could make another appointment for the pt to see Dr. Gaylyn Rong.  Tammy faxed to [place] the labs that showed the HGB result from appt on 11-18.  We are waiting for Dr. Lodema Pilot office to call us back with appt for this pt.

## 2012-04-13 ENCOUNTER — Encounter (HOSPITAL_COMMUNITY): Payer: Self-pay | Admitting: Internal Medicine

## 2012-04-13 ENCOUNTER — Encounter: Payer: Self-pay | Admitting: Internal Medicine

## 2012-04-13 ENCOUNTER — Observation Stay (HOSPITAL_COMMUNITY)
Admission: AD | Admit: 2012-04-13 | Discharge: 2012-04-15 | Disposition: A | Discharge status: Home / Self Care | Payer: BC Managed Care – PPO | Source: Ambulatory Visit | Attending: Internal Medicine | Admitting: Internal Medicine

## 2012-04-13 ENCOUNTER — Telehealth: Payer: Self-pay | Admitting: Oncology

## 2012-04-13 ENCOUNTER — Telehealth: Payer: Self-pay

## 2012-04-13 ENCOUNTER — Observation Stay (HOSPITAL_COMMUNITY): Payer: BC Managed Care – PPO

## 2012-04-13 DIAGNOSIS — I739 Peripheral vascular disease, unspecified: Secondary | ICD-10-CM | POA: Insufficient documentation

## 2012-04-13 DIAGNOSIS — J471 Bronchiectasis with (acute) exacerbation: Secondary | ICD-10-CM | POA: Insufficient documentation

## 2012-04-13 DIAGNOSIS — I10 Essential (primary) hypertension: Secondary | ICD-10-CM | POA: Diagnosis present

## 2012-04-13 DIAGNOSIS — N183 Chronic kidney disease, stage 3 unspecified: Secondary | ICD-10-CM

## 2012-04-13 DIAGNOSIS — I519 Heart disease, unspecified: Secondary | ICD-10-CM | POA: Diagnosis present

## 2012-04-13 DIAGNOSIS — K922 Gastrointestinal hemorrhage, unspecified: Secondary | ICD-10-CM | POA: Diagnosis present

## 2012-04-13 DIAGNOSIS — Z794 Long term (current) use of insulin: Secondary | ICD-10-CM | POA: Insufficient documentation

## 2012-04-13 DIAGNOSIS — G4733 Obstructive sleep apnea (adult) (pediatric): Secondary | ICD-10-CM | POA: Insufficient documentation

## 2012-04-13 DIAGNOSIS — I2510 Atherosclerotic heart disease of native coronary artery without angina pectoris: Secondary | ICD-10-CM

## 2012-04-13 DIAGNOSIS — J209 Acute bronchitis, unspecified: Secondary | ICD-10-CM

## 2012-04-13 DIAGNOSIS — R079 Chest pain, unspecified: Secondary | ICD-10-CM

## 2012-04-13 DIAGNOSIS — F0280 Dementia in other diseases classified elsewhere without behavioral disturbance: Secondary | ICD-10-CM | POA: Insufficient documentation

## 2012-04-13 DIAGNOSIS — Z951 Presence of aortocoronary bypass graft: Secondary | ICD-10-CM

## 2012-04-13 DIAGNOSIS — Z791 Long term (current) use of non-steroidal anti-inflammatories (NSAID): Secondary | ICD-10-CM | POA: Insufficient documentation

## 2012-04-13 DIAGNOSIS — Z7902 Long term (current) use of antithrombotics/antiplatelets: Secondary | ICD-10-CM | POA: Insufficient documentation

## 2012-04-13 DIAGNOSIS — M109 Gout, unspecified: Secondary | ICD-10-CM | POA: Diagnosis present

## 2012-04-13 DIAGNOSIS — E669 Obesity, unspecified: Secondary | ICD-10-CM | POA: Diagnosis present

## 2012-04-13 DIAGNOSIS — G309 Alzheimer's disease, unspecified: Secondary | ICD-10-CM | POA: Insufficient documentation

## 2012-04-13 DIAGNOSIS — E119 Type 2 diabetes mellitus without complications: Secondary | ICD-10-CM | POA: Insufficient documentation

## 2012-04-13 DIAGNOSIS — K31811 Angiodysplasia of stomach and duodenum with bleeding: Secondary | ICD-10-CM

## 2012-04-13 DIAGNOSIS — R0602 Shortness of breath: Secondary | ICD-10-CM | POA: Diagnosis present

## 2012-04-13 DIAGNOSIS — I5033 Acute on chronic diastolic (congestive) heart failure: Secondary | ICD-10-CM

## 2012-04-13 DIAGNOSIS — Z79899 Other long term (current) drug therapy: Secondary | ICD-10-CM | POA: Insufficient documentation

## 2012-04-13 DIAGNOSIS — D631 Anemia in chronic kidney disease: Principal | ICD-10-CM | POA: Insufficient documentation

## 2012-04-13 DIAGNOSIS — I509 Heart failure, unspecified: Secondary | ICD-10-CM | POA: Insufficient documentation

## 2012-04-13 DIAGNOSIS — D649 Anemia, unspecified: Secondary | ICD-10-CM

## 2012-04-13 DIAGNOSIS — I129 Hypertensive chronic kidney disease with stage 1 through stage 4 chronic kidney disease, or unspecified chronic kidney disease: Secondary | ICD-10-CM | POA: Insufficient documentation

## 2012-04-13 DIAGNOSIS — Z9861 Coronary angioplasty status: Secondary | ICD-10-CM | POA: Insufficient documentation

## 2012-04-13 LAB — CBC
HCT: 24.5 % — ABNORMAL LOW (ref 39.0–52.0)
Hemoglobin: 7.4 g/dL — ABNORMAL LOW (ref 13.0–17.0)
MCH: 26.7 pg (ref 26.0–34.0)
MCHC: 30.2 g/dL (ref 30.0–36.0)
MCV: 88.4 fL (ref 78.0–100.0)

## 2012-04-13 LAB — COMPREHENSIVE METABOLIC PANEL
ALT: 13 U/L (ref 0–53)
CO2: 24 mEq/L (ref 19–32)
Calcium: 8.9 mg/dL (ref 8.4–10.5)
Creatinine, Ser: 1.51 mg/dL — ABNORMAL HIGH (ref 0.50–1.35)
GFR calc Af Amer: 50 mL/min — ABNORMAL LOW (ref >90)
GFR calc non Af Amer: 43 mL/min — ABNORMAL LOW (ref >90)
Glucose, Bld: 200 mg/dL — ABNORMAL HIGH (ref 70–99)
Total Bilirubin: 0.3 mg/dL (ref 0.3–1.2)

## 2012-04-13 LAB — GLUCOSE, CAPILLARY
Glucose-Capillary: 208 mg/dL — ABNORMAL HIGH (ref 70–99)
Glucose-Capillary: 129 mg/dL — ABNORMAL HIGH (ref 70–99)

## 2012-04-13 LAB — PRO B NATRIURETIC PEPTIDE: Pro B Natriuretic peptide (BNP): 1184 pg/mL — ABNORMAL HIGH (ref 0–450)

## 2012-04-13 LAB — TROPONIN I: Troponin I: <0.3 ng/mL (ref <0.30)

## 2012-04-13 MED ORDER — FUROSEMIDE 40 MG PO TABS
40.0000 mg | ORAL_TABLET | Freq: Every day | ORAL | Status: DC
  Filled 2012-04-13 (×2): qty 1

## 2012-04-13 MED ORDER — ONDANSETRON HCL 4 MG/2ML IJ SOLN: 4.0000 mg | Freq: Four times a day (QID) | INTRAMUSCULAR | Status: DC | PRN

## 2012-04-13 MED ORDER — FOLIC ACID 1 MG PO TABS
1.0000 mg | ORAL_TABLET | Freq: Every day | ORAL | Status: DC
  Administered 2012-04-14 – 2012-04-15 (×2): 1 mg via ORAL
  Filled 2012-04-13 (×2): qty 1

## 2012-04-13 MED ORDER — GABAPENTIN 100 MG PO TABS: 100.0000 mg | ORAL_TABLET | Freq: Every day | ORAL | Status: DC

## 2012-04-13 MED ORDER — ALBUTEROL SULFATE HFA 108 (90 BASE) MCG/ACT IN AERS
2.0000 | INHALATION_SPRAY | RESPIRATORY_TRACT | Status: DC | PRN
  Administered 2012-04-14: 2 via RESPIRATORY_TRACT
  Filled 2012-04-13: qty 6.7

## 2012-04-13 MED ORDER — PANTOPRAZOLE SODIUM 40 MG PO TBEC
40.0000 mg | DELAYED_RELEASE_TABLET | Freq: Every day | ORAL | Status: DC
  Administered 2012-04-14 – 2012-04-15 (×2): 40 mg via ORAL
  Filled 2012-04-13 (×2): qty 1

## 2012-04-13 MED ORDER — ADULT MULTIVITAMIN W/MINERALS CH
1.0000 | ORAL_TABLET | Freq: Every day | ORAL | Status: DC
  Administered 2012-04-14 – 2012-04-15 (×2): 1 via ORAL
  Filled 2012-04-13 (×2): qty 1

## 2012-04-13 MED ORDER — FOLIC ACID 400 MCG PO TABS: 400.0000 ug | ORAL_TABLET | Freq: Every day | ORAL | Status: DC

## 2012-04-13 MED ORDER — POLYETHYLENE GLYCOL 3350 17 G PO PACK
17.0000 g | PACK | Freq: Every day | ORAL | Status: DC | PRN
  Filled 2012-04-13: qty 1

## 2012-04-13 MED ORDER — GABAPENTIN 100 MG PO CAPS
100.0000 mg | ORAL_CAPSULE | Freq: Every day | ORAL | Status: DC
  Administered 2012-04-13 – 2012-04-14 (×2): 100 mg via ORAL
  Filled 2012-04-13 (×3): qty 1

## 2012-04-13 MED ORDER — ALLOPURINOL 300 MG PO TABS
300.0000 mg | ORAL_TABLET | Freq: Two times a day (BID) | ORAL | Status: DC
  Administered 2012-04-13 – 2012-04-15 (×4): 300 mg via ORAL
  Filled 2012-04-13 (×5): qty 1

## 2012-04-13 MED ORDER — AMLODIPINE BESYLATE 10 MG PO TABS
10.0000 mg | ORAL_TABLET | Freq: Every day | ORAL | Status: DC
  Administered 2012-04-14 – 2012-04-15 (×2): 10 mg via ORAL
  Filled 2012-04-13 (×2): qty 1

## 2012-04-13 MED ORDER — ATORVASTATIN CALCIUM 20 MG PO TABS
20.0000 mg | ORAL_TABLET | Freq: Every day | ORAL | Status: DC
  Administered 2012-04-13 – 2012-04-14 (×2): 20 mg via ORAL
  Filled 2012-04-13 (×3): qty 1

## 2012-04-13 MED ORDER — FUROSEMIDE 10 MG/ML IJ SOLN
20.0000 mg | Freq: Once | INTRAMUSCULAR | Status: AC
  Administered 2012-04-13: 20 mg via INTRAVENOUS
  Filled 2012-04-13: qty 2

## 2012-04-13 MED ORDER — COLCHICINE 0.6 MG PO TABS
0.6000 mg | ORAL_TABLET | Freq: Every day | ORAL | Status: DC
  Administered 2012-04-14 – 2012-04-15 (×2): 0.6 mg via ORAL
  Filled 2012-04-13 (×2): qty 1

## 2012-04-13 MED ORDER — PREDNISONE 5 MG PO TABS
5.0000 mg | ORAL_TABLET | Freq: Every day | ORAL | Status: DC
  Administered 2012-04-14: 5 mg via ORAL
  Filled 2012-04-13: qty 1

## 2012-04-13 MED ORDER — ALBUTEROL SULFATE (5 MG/ML) 0.5% IN NEBU
2.5000 mg | INHALATION_SOLUTION | Freq: Three times a day (TID) | RESPIRATORY_TRACT | Status: DC
  Administered 2012-04-13 – 2012-04-15 (×6): 2.5 mg via RESPIRATORY_TRACT
  Filled 2012-04-13 (×5): qty 0.5

## 2012-04-13 MED ORDER — INSULIN ASPART 100 UNIT/ML ~~LOC~~ SOLN
10.0000 [IU] | Freq: Every day | SUBCUTANEOUS | Status: DC
  Administered 2012-04-14 – 2012-04-15 (×2): 10 [IU] via SUBCUTANEOUS

## 2012-04-13 MED ORDER — ALBUTEROL SULFATE (5 MG/ML) 0.5% IN NEBU: 2.5000 mg | INHALATION_SOLUTION | RESPIRATORY_TRACT | Status: DC | PRN

## 2012-04-13 MED ORDER — ONDANSETRON HCL 4 MG PO TABS: 4.0000 mg | ORAL_TABLET | Freq: Four times a day (QID) | ORAL | Status: DC | PRN

## 2012-04-13 MED ORDER — ACETAMINOPHEN ER 650 MG PO TBCR: 1300.0000 mg | EXTENDED_RELEASE_TABLET | Freq: Two times a day (BID) | ORAL | Status: DC

## 2012-04-13 MED ORDER — CLONIDINE HCL ER 0.1 MG PO TB12
0.1000 mg | ORAL_TABLET | Freq: Two times a day (BID) | ORAL | Status: DC
  Administered 2012-04-13 – 2012-04-15 (×4): 0.1 mg via ORAL
  Filled 2012-04-13 (×5): qty 1

## 2012-04-13 MED ORDER — ESCITALOPRAM OXALATE 20 MG PO TABS
20.0000 mg | ORAL_TABLET | Freq: Every day | ORAL | Status: DC
  Administered 2012-04-14 – 2012-04-15 (×2): 20 mg via ORAL
  Filled 2012-04-13 (×2): qty 1

## 2012-04-13 MED ORDER — SODIUM CHLORIDE 0.9 % IJ SOLN
3.0000 mL | Freq: Two times a day (BID) | INTRAMUSCULAR | Status: DC
  Administered 2012-04-14 (×2): 3 mL via INTRAVENOUS

## 2012-04-13 MED ORDER — INSULIN GLARGINE 100 UNIT/ML ~~LOC~~ SOLN
30.0000 [IU] | Freq: Two times a day (BID) | SUBCUTANEOUS | Status: DC
  Administered 2012-04-13 – 2012-04-15 (×4): 30 [IU] via SUBCUTANEOUS

## 2012-04-13 MED ORDER — ISOSORBIDE MONONITRATE ER 60 MG PO TB24
60.0000 mg | ORAL_TABLET | Freq: Every day | ORAL | Status: DC
  Administered 2012-04-14 – 2012-04-15 (×2): 60 mg via ORAL
  Filled 2012-04-13 (×3): qty 1

## 2012-04-13 MED ORDER — ACETAMINOPHEN ER 650 MG PO TBCR
1300.0000 mg | EXTENDED_RELEASE_TABLET | Freq: Two times a day (BID) | ORAL | Status: DC
Start: 2012-04-13 — End: 2012-04-13

## 2012-04-13 MED ORDER — INSULIN ASPART 100 UNIT/ML ~~LOC~~ SOLN
20.0000 [IU] | Freq: Two times a day (BID) | SUBCUTANEOUS | Status: DC
  Administered 2012-04-14: 20 [IU] via SUBCUTANEOUS

## 2012-04-13 MED ORDER — NITROGLYCERIN 0.4 MG SL SUBL: 0.4000 mg | SUBLINGUAL_TABLET | SUBLINGUAL | Status: DC | PRN

## 2012-04-13 MED ORDER — ASPIRIN 325 MG PO TABS
325.0000 mg | ORAL_TABLET | Freq: Every day | ORAL | Status: DC
  Administered 2012-04-14 – 2012-04-15 (×2): 325 mg via ORAL
  Filled 2012-04-13 (×3): qty 1

## 2012-04-13 MED ORDER — EZETIMIBE 10 MG PO TABS
10.0000 mg | ORAL_TABLET | Freq: Every day | ORAL | Status: DC
  Administered 2012-04-14 – 2012-04-15 (×2): 10 mg via ORAL
  Filled 2012-04-13 (×2): qty 1

## 2012-04-13 MED ORDER — ADULT MULTIVITAMIN W/MINERALS CH: 0.5000 | ORAL_TABLET | Freq: Two times a day (BID) | ORAL | Status: DC

## 2012-04-13 MED ORDER — TIOTROPIUM BROMIDE MONOHYDRATE 18 MCG IN CAPS
18.0000 ug | ORAL_CAPSULE | Freq: Every day | RESPIRATORY_TRACT | Status: DC
  Administered 2012-04-14: 18 ug via RESPIRATORY_TRACT
  Filled 2012-04-13: qty 5

## 2012-04-13 NOTE — Progress Notes (Signed)
Patient arrived as a direct admit from the md office via w/c.  Patient a&o x4, denies pain or sob.  Patient oriented to unit and unit routine.   Skin w/d without evidence of skin breakdown other then bruises on bil arms.  Continue to monitor.

## 2012-04-13 NOTE — Telephone Encounter (Signed)
Left 2nd message with Dr. Lodema Pilot scheduler regarding rescheduling pt's missed appointment.  I relayed that his hgb was 7.5 and we were anxious to get him an appointment

## 2012-04-13 NOTE — Telephone Encounter (Signed)
Error

## 2012-04-13 NOTE — H&P (Addendum)
[facility] History and Physical  [name] EAV:[id number] DOB: 07-May-1934 DOA: 04/13/2012  Referring physician: Dr. Jeannetta Nap PCP: Kaleen Mask, MD   Chief Complaint: shortness of breath, fatigue  HPI:  The patient is a 78yo male with history of CAD s/p CABG and mutliple stents, PVD s/p multiple stents, COPD, AVM with recurrent GIB with recent EGD 1 month ago, stage III renal failure requiring intermittent dialysis in the past who presents with shortness of breath, fatigue and worsening anemia.  He has not felt well since August when he had iliac stenting/angioplaty.  In October, he was hospitalized for GIB.   He had an upper endoscopy for GIB by Dr. Arlyce Dice and had several AVMs cauterized.  He was started on iron pills, but was not able to tolerate them because of nausea.  He was rehospitalized two weeks ago with nausea, vomiting, diarrhea, dehydration, and AKI, which improved with hydration.  His iron was stopped and he has not had such severe symptoms since that time, however, he continues to have dry heaves at home in the morning which improve over the course of the day.    On 11/18, he had bloodwork done by his PCP which demonstrated worsening anemia and when the clinic staff spoke to him, he endorsed feeling more [place] of breath recently (symptomatic anemia) and they recommended transfusion.  He is being directly admitted for blood transfusion.  He has been having daily bowel movements that have not have blood or melena.  He has been taking 3L of oxygen at home because of increasing shortness of breath and he has been having intermittent chest pains, left chest, moderate severity associated with his SOB and nausea.  He states his is chronic, however, his SOB has been worse and he has had increased lower extremity edema and fatigue this week also.  Chest pains are associated with palpitations.     Per family, he had a low grade fever two days ago with sore throat and mild left ear  pain.    Review of Systems:   Denies fevers, chills, today, but had fever two days ago.  Has poor hearing and vision chronically.  + sore throat and left ear pain.  Chest pain as above.  He has chronic cough productive of thick sputum which is mildly improved since stopping lisinopril a few days ago.  Daily nausea and heaves.  Denies diarrhea.  Has not had a BM today.  Denies dysuria, lymphadenopathy, hematemesis or hemoptysis, rash, focal weakness or numbness, depression, anxiety, polyuria.    Past Medical History  Diagnosis Date  . Adenomatous colon polyp   . Diverticulosis   . GERD (gastroesophageal reflux disease)   . Hiatal hernia   . Alzheimer disease   . Gout     "hands; backbone; elbows; shoulders"  . Asthma with bronchitis   . COPD (chronic obstructive pulmonary disease)   . Chronic hypoxemic respiratory failure   . OSA (obstructive sleep apnea)     CPAP  . CHF (congestive heart failure)     grade 2 diastolic dysfunction, EF 65-70% on ECHO 12/2011  . Blood transfusion     "I've had about 6 pints" (12/31/2011)  . Pneumonia     intermittent  . Anemia   . GI bleed     GI Dr. Marina Goodell,   . Coronary artery disease   . Hypertension   . Peripheral vascular disease     PAD  . Dysrhythmia     "skips"  . Anginal  pain   . Myocardial infarction     "they said I had 2 light ones"  . Shortness of breath 12/31/2011    "all the time"  . Type II diabetes mellitus   . Arthritis     "plenty"  . Chronic lower back pain   . Renal insufficiency     "went on dialysis probably 3 times" Last 04/2011  . Skin cancer     "forehead; arms"  . Depression   . Diverticulosis   . Colon polyps     adenomatous and hyperplastic  . GERD (gastroesophageal reflux disease)   . Hiatal hernia    Past Surgical History  Procedure Date  . Hand surgery 1990s    "chain saw accident; had to reattach fingers on left hand"  . Esophagogastroduodenoscopy 05/05/2011    Procedure: ESOPHAGOGASTRODUODENOSCOPY  (EGD);  Surgeon: Louis Meckel, MD;  Location: [facility];  Service: Endoscopy;  Laterality: N/A;  . Flexible sigmoidoscopy 05/05/2011    Procedure: FLEXIBLE SIGMOIDOSCOPY;  Surgeon: Louis Meckel, MD;  Location: [facility];  Service: Endoscopy;  Laterality: N/A;  . Laparoscopic cholecystectomy ~ 2001  . Excisional hemorrhoidectomy 1950's  . Cataract extraction w/ intraocular lens  implant, bilateral   . Coronary artery bypass graft 1997    CABG X2 vessels  . Iliac artery stent 11/2011    left/H&P  . Renal artery stent 2010    left/H&P  . Iliac artery stent 11/2011    right  . Coronary angioplasty with stent placement 12/31/2011    "I have 15 stents; heart, kidney, aorta, legs"  . Colonoscopy w/ biopsies and polypectomy     "have had probably 5 cut out" (12/31/2011)  . Enteroscopy 03/04/2012    Procedure: ENTEROSCOPY;  Surgeon: Louis Meckel, MD;  Location: [facility] ENDOSCOPY;  Service: Endoscopy;  Laterality: N/A;  jessica/leone   Social History:  reports that he quit smoking about 21 years ago. His smoking use included Cigarettes. He has a 112.5 pack-year smoking history. He quit smokeless tobacco use about 21 years ago. His smokeless tobacco use included Chew. He reports that he does not drink alcohol or use illicit drugs.  Daily caffeine use.  Lives in a house with his wife.  Uses a cane to ambulate and he has a walker, but he does not use it.  He does not cook or pay bills.  He does not drive.    Allergies  Allergen Reactions  . Donepezil Diarrhea  . Heparin Itching and Rash  . Hydralazine Other (See Comments)    DO NOT TAKE PER MD  . Hydromorphone Itching  . Morphine Itching  . Plavix (Clopidogrel Bisulfate) Other (See Comments)    GI BLEEDING; "so bad I had to take blood; dr took me off it"  . Promethazine Other (See Comments)    DO NOT TAKE PER MD  . Sulfonamide Derivatives Itching  . Zaroxolyn (Metolazone) Diarrhea  . Cefuroxime Other (See Comments)    REACTION: unkown  reaction  . Metoprolol Other (See Comments)    unknown  . Ertapenem Itching and Rash    12/31/2011 pt does not recall allergy or severity    Family History  Problem Relation Age of Onset  . Lung cancer Brother   . Diabetes Mother   . Heart disease Sister   . Stomach cancer Maternal Grandfather   . Heart disease Father   . Heart disease Brother   . Arthritis Mother   . ALS Sister   . ALS  neice  . ALS      nephew  . Colon cancer Neg Hx      Prior to Admission medications   Medication Sig Start Date End Date Taking? Authorizing Provider  acetaminophen (TYLENOL) 650 MG CR tablet Take 1,300 mg by mouth 2 (two) times daily.     Historical Provider, MD  albuterol (PROAIR HFA) 108 (90 BASE) MCG/ACT inhaler Inhale 2 puffs into the lungs every 4 (four) hours as needed for wheezing or shortness of breath. 10/08/11 10/07/12  Waymon Budge, MD  albuterol (PROVENTIL) (2.5 MG/3ML) 0.083% nebulizer solution Take 2.5 mg by nebulization every 6 (six) hours as needed. For shortness of breath    Historical Provider, MD  allopurinol (ZYLOPRIM) 300 MG tablet Take 300 mg by mouth 2 (two) times daily.    Historical Provider, MD  amLODipine (NORVASC) 10 MG tablet Take 1 tablet (10 mg total) by mouth daily. 12/11/11 12/10/12  Wilburt Finlay, PA  aspirin 325 MG tablet Take 325 mg by mouth daily.      Historical Provider, MD  atorvastatin (LIPITOR) 20 MG tablet Take 20 mg by mouth at bedtime.    Historical Provider, MD  cloNIDine HCl (KAPVAY) 0.1 MG TB12 ER tablet Take 1 tablet (0.1 mg total) by mouth 2 (two) times daily. 12/11/11   Wilburt Finlay, PA  colchicine 0.6 MG tablet Take 0.6 mg by mouth daily.     Historical Provider, MD  escitalopram (LEXAPRO) 10 MG tablet Take 20 mg by mouth daily.     Historical Provider, MD  ezetimibe (ZETIA) 10 MG tablet Take 10 mg by mouth daily.      Historical Provider, MD  folic acid (FOLVITE) 400 MCG tablet Take 400 mcg by mouth daily.      Historical Provider, MD    furosemide (LASIX) 40 MG tablet Take 1 tablet (40 mg total) by mouth daily. 04/04/12   Meredeth Ide, MD  gabapentin (NEURONTIN) 100 MG tablet Take 100 mg by mouth at bedtime.      Historical Provider, MD  insulin aspart (NOVOLOG) 100 UNIT/ML injection Inject 10-20 Units into the skin 3 (three) times daily before meals. 10 units in the morning, 20 units at noon and 20 units in the evening    Historical Provider, MD  insulin glargine (LANTUS) 100 UNIT/ML injection Inject 57 Units into the skin 2 (two) times daily.     Historical Provider, MD  isosorbide mononitrate (IMDUR) 30 MG 24 hr tablet Take 60 mg by mouth daily.  03/09/11   Historical Provider, MD  lisinopril (PRINIVIL,ZESTRIL) 5 MG tablet Take 1 tablet (5 mg total) by mouth daily. 03/11/12 03/11/13  Marinda Elk, MD  Multiple Vitamin (MULTIVITAMIN WITH MINERALS) TABS Take 0.5 tablets by mouth 2 (two) times daily.    Historical Provider, MD  nitroGLYCERIN (NITROSTAT) 0.4 MG SL tablet Place 0.4 mg under the tongue every 5 (five) minutes as needed. May repeat x 3 for chest pain    Historical Provider, MD  pantoprazole (PROTONIX) 40 MG tablet Take 40 mg by mouth daily.      Historical Provider, MD  predniSONE (DELTASONE) 5 MG tablet Take 5 mg by mouth daily.      Historical Provider, MD  tiotropium (SPIRIVA) 18 MCG inhalation capsule Place 18 mcg into inhaler and inhale daily.   04/21/11 04/20/12  Waymon Budge, MD   Physical Exam: Filed Vitals:   04/13/12 1400  BP: 130/51  Pulse: 74  Temp: 98 F (36.7 C)  Resp: 19  SpO2: 98%     General:  Caucasian male, sitting at edge of bed, wearing glasses, mildly hard of hearing, pleasant and conversant  Eyes: PERRL, anicteric, noninjected  ENT: Nares clear, OP very dry without exudate or plaques  Neck: supple, no thyromegaly  Lymph:  No cervical or supraclavicular LAD  Cardiovascular: RRR, no m/r/g, nl S1, S2, 2+ radial and left DP pulse.  Faint right DP pulse  Respiratory:  Rales at the lateral left base, no wheezes or rhonchi  Abdomen: NABS, soft, nondistended, nontender, no organomegaly  Skin: Lack of hair on lower legs with mild hyperpigmentation secondary to poor circulation.  Scar left leg from vein graft.    Musculoskeletal: Normal tone and bulk, 2+ pitting edema of right foot, 1+ left foot (always larger on right compared to left)  Psychiatric: A&Ox4, normal affect  Neurologic: III-XII grossly intact, strength 5/5 throughout  Labs on Admission:  Basic Metabolic Panel: No results found for this basename: NA:5,K:5,CL:5,CO2:5,GLUCOSE:5,BUN:5,CREATININE:5,CALCIUM:5,MG:5,PHOS:5 in the last 168 hours Liver Function Tests: No results found for this basename: AST:5,ALT:5,ALKPHOS:5,BILITOT:5,PROT:5,ALBUMIN:5 in the last 168 hours No results found for this basename: LIPASE:5,AMYLASE:5 in the last 168 hours No results found for this basename: AMMONIA:5 in the last 168 hours CBC: No results found for this basename: WBC:5,NEUTROABS:5,HGB:5,HCT:5,MCV:5,PLT:5 in the last 168 hours Cardiac Enzymes: No results found for this basename: CKTOTAL:5,CKMB:5,CKMBINDEX:5,TROPONINI:5 in the last 168 hours  BNP (last 3 results)  Basename 03/03/12 2128 01/02/12 1130 01/02/12 0550  PROBNP 509.1* 945.2* 1007.0*   CBG: No results found for this basename: GLUCAP:5 in the last 168 hours  Radiological Exams on Admission: No results found.  ECG:  NSR, inverted T-waves in inferior leads, now upright in V4-V6 (inverted earlier this month)  Assessment/Plan Principal Problem:  *Anemia Active Problems:  HTN (hypertension)  Acute on chronic diastolic heart failure  Gastric and duodenal angiodysplasia with hemorrhage  Chest pain  CAD (coronary artery disease)  Shortness of breath   Anemia, likely due to ongoing GI losses from AVM and likely contributing to increased SOB and intermittent chest pains.   -  CBC now -  Transfuse 2 unit PRBC -  Lasix 20mg  IV between  units -  Patient had normal folate, vitamin B12.  Labs were consistent with iron deficiency -  Will need hematology follow up for IV iron infusions  SOB, may be related to worsening heart failure after decreasing lasix recently (grade 2 diastolic dysfunction in ECHO from 12/2011), anemia, bronchitis, or possible pneumonia with rales audible on the left base and recent fever.  May also be related to changes in kidney function.  Acute on chronic heart failure.   -  CXR PA/Lat:  If pneumonia, start tx for HAP given multiple recent hospitalizations:  Pulmonary edema -  Transfusion -  Continue lasix and adjust dose if renal function tolerates -  BNP:  Elevated compared to baseline -  BMP -  Continue spiriva and albuterol -  May need further diuresis in AM -  Daily weight, strict I/O, goal negative  Chest pains, chronic problem but with new symptoms of SOB and increased LEE.  Previously attributed to N/V, but in patient with known CAD s/p CABG and stents, consider ischemia.   -  Telemetry and troponins -  Already on aspirin, statin,  -  Continue IMDUR and norvasc -  Patient was previously intolerant to BB -  Consider restarting ARB if blood pressure and potassium tolerates at discharge -  Continue PPI -  NTG prn pain  Nausea and heaves, likely medication side effect as happens after morning medications.  Consider gastric emptying study due to diabetes.   -  Zofran as needed  Chronic kidney disease with recent AKI -  Trend creatinine  Hyperkalemia:  Unclear etiology, may be related to diet and kidney function but renal function appears to be at baseline -  Continue to hold ACEI/ARB at this time.    HTN, blood pressure stable.  Continue current medications COPD, increased SOB as above OSA, continue CPAP T2DM, stable.  Continue home insulin regimen GERD, stable. Continue ppi Alzheimers, stable.  Continue lexapro Gout:  Stable, Continue allopurinol and colchicine and prednisone  DIET:   Healthy heart, diabetic ACCESS:  None currently IVF:  Transfusions as above PROPH:  SCDs  Code Status: full code Family Communication: spoke with patient and daughter who was at bedside Disposition Plan: pending blood transfusions, troponins, CXR, likely to home  Time spent: 45 min  Renae Fickle [facility] Pager [phone]  If 7PM-7AM, please contact night-coverage [email or web address] Password [facility] 04/13/2012, 2:53 PM

## 2012-04-13 NOTE — Telephone Encounter (Signed)
S/W Dr. Clarene Duke office in re r/s np appt 11/27 @ 10:30. New calendar mailed out. Referring office will notify pt of appt.

## 2012-04-14 ENCOUNTER — Observation Stay (HOSPITAL_COMMUNITY): Payer: BC Managed Care – PPO

## 2012-04-14 DIAGNOSIS — J209 Acute bronchitis, unspecified: Secondary | ICD-10-CM

## 2012-04-14 LAB — TYPE AND SCREEN

## 2012-04-14 LAB — GLUCOSE, CAPILLARY
Glucose-Capillary: 42 mg/dL — CL (ref 70–99)
Glucose-Capillary: 50 mg/dL — ABNORMAL LOW (ref 70–99)
Glucose-Capillary: 99 mg/dL (ref 70–99)
Glucose-Capillary: 174 mg/dL — ABNORMAL HIGH (ref 70–99)

## 2012-04-14 LAB — TSH: TSH: 0.873 u[IU]/mL (ref 0.350–4.500)

## 2012-04-14 LAB — CBC
Hemoglobin: 9.1 g/dL — ABNORMAL LOW (ref 13.0–17.0)
RBC: 3.3 MIL/uL — ABNORMAL LOW (ref 4.22–5.81)

## 2012-04-14 MED ORDER — TIOTROPIUM BROMIDE MONOHYDRATE 18 MCG IN CAPS
18.0000 ug | ORAL_CAPSULE | Freq: Every day | RESPIRATORY_TRACT | Status: DC
  Administered 2012-04-14: 18 ug via RESPIRATORY_TRACT
  Filled 2012-04-14: qty 5

## 2012-04-14 MED ORDER — METHYLPREDNISOLONE SODIUM SUCC 125 MG IJ SOLR
80.0000 mg | INTRAMUSCULAR | Status: DC
  Administered 2012-04-14: 80 mg via INTRAVENOUS
  Filled 2012-04-14 (×2): qty 1.28

## 2012-04-14 MED ORDER — BUDESONIDE-FORMOTEROL FUMARATE 80-4.5 MCG/ACT IN AERO
2.0000 | INHALATION_SPRAY | Freq: Two times a day (BID) | RESPIRATORY_TRACT | Status: DC
  Administered 2012-04-14 – 2012-04-15 (×2): 2 via RESPIRATORY_TRACT
  Filled 2012-04-14: qty 6.9

## 2012-04-14 MED ORDER — LEVOFLOXACIN 500 MG PO TABS
500.0000 mg | ORAL_TABLET | Freq: Every day | ORAL | Status: DC
  Administered 2012-04-14 – 2012-04-15 (×2): 500 mg via ORAL
  Filled 2012-04-14 (×2): qty 1

## 2012-04-14 NOTE — Progress Notes (Signed)
[facility] PROGRESS NOTE  LAD PLANE WUJ:[id number] DOB: 1933-12-25 DOA: 04/13/2012 PCP: Kaleen Mask, MD  Assessment/Plan: 1. Multifactorial dyspnea - suspect the patient has restrictive lung disease and COPD which I think  is a higher contributory to his chronic dyspnea than congestive heart failure or anemia. Last pulmonary function testing done in 2012 showed a combination of obstructive and restrictive lung ds. Patient with wheezing on exam on November 21-suspect COPD exacerbation-started IV steroids, continue nebulizers. With low grade fever and yellow sputum we'll start oral antibiotic as well. We will obtain chest CT to followup on possible interstitial lung disease as seen on CT from 2012. 2. Chronic systolic congestive heart failure - suspect patient is euvolemic. Continue home dose Lasix 3. Peripheral vascular disease 4. Chronic anemia status post transfusion of 2 units of packed red blood cells on November 20 5. Diabetes mellitus type 2-patient to continue Lantus and sliding scale insulin NovoLog 6. Coronary artery disease-consult [facility] to give an opinion on the possibility that his symptoms are related to silent ischemia.  Code Status: full Family Communication: wife  Disposition Plan: home   Consultants:  [facility]  Procedures:  CT chest  Antibiotics:  HPI/Subjective: C/o severe dyspnea   Objective: Filed Vitals:   04/14/12 0840 04/14/12 0844 04/14/12 1405 04/14/12 1648  BP:  165/49 135/54 109/43  Pulse:  78 74 67  Temp:  99.3 F (37.4 C) 99.9 F (37.7 C) 97.3 F (36.3 C)  TempSrc:  Oral Oral Oral  Resp:  20 20 20   Height:      Weight:      SpO2: 94% 95% 95% 98%    Intake/Output Summary (Last 24 hours) at 04/14/12 1747 Last data filed at 04/14/12 1652  Gross per 24 hour  Intake   1507 ml  Output   2900 ml  Net  -1393 ml   Filed Weights   04/14/12 0520  Weight: 107.004 kg (235 lb 14.4 oz)    Exam:   General:   axox3  Cardiovascular: rrr  Respiratory: bilat wheezes, prolonged expiratory phase,   Abdomen: soft, nt   Data Reviewed: Basic Metabolic Panel:  Lab 04/13/12 9562  NA 141  K 5.6*  CL 107  CO2 24  GLUCOSE 200*  BUN 26*  CREATININE 1.51*  CALCIUM 8.9  MG 1.7  PHOS --   Liver Function Tests:  Lab 04/13/12 1504  AST 51*  ALT 13  ALKPHOS 66  BILITOT 0.3  PROT 6.4  ALBUMIN 3.2*   No results found for this basename: LIPASE:5,AMYLASE:5 in the last 168 hours No results found for this basename: AMMONIA:5 in the last 168 hours CBC:  Lab 04/14/12 0655 04/13/12 1504  WBC 7.8 6.6  NEUTROABS -- --  HGB 9.1* 7.4*  HCT 28.9* 24.5*  MCV 87.6 88.4  PLT 194 184   Cardiac Enzymes:  Lab 04/14/12 0655 04/13/12 1532  CKTOTAL -- --  CKMB -- --  CKMBINDEX -- --  TROPONINI <0.30 <0.30   BNP (last 3 results)  Basename 04/13/12 1532 03/03/12 2128 01/02/12 1130  PROBNP 1184.0* 509.1* 945.2*   CBG:  Lab 04/14/12 0741 04/13/12 2110 04/13/12 1642 04/13/12 1409  GLUCAP 80 163* 129* 208*    No results found for this or any previous visit (from the past 240 hour(s)).   Studies: X-ray Chest Pa And Lateral   04/13/2012  *RADIOLOGY REPORT*  Clinical Data: Left base rales.  Fever.  CHEST - 2 VIEW  Comparison: 04/01/2012  Findings: Previous  median sternotomy.  Stable mild cardiomegaly. Mild diffuse interstitial edema with septal lines seen peripherally in the lung bases, new since previous exam. No focal airspace consolidation.  Mild central pulmonary vascular congestion as before.  No effusion.  IMPRESSION:  1.  Stable cardiomegaly with mild interstitial edema bilaterally.   Original Report Authenticated By: D. Andria Rhein, MD     Scheduled Meds:   . albuterol  2.5 mg Nebulization TID  . allopurinol  300 mg Oral BID  . amLODipine  10 mg Oral Daily  . aspirin  325 mg Oral Daily  . atorvastatin  20 mg Oral QHS  . budesonide-formoterol  2 puff Inhalation BID  . cloNIDine HCl  0.1  mg Oral BID  . colchicine  0.6 mg Oral Daily  . escitalopram  20 mg Oral Daily  . ezetimibe  10 mg Oral Daily  . folic acid  1 mg Oral Daily  . [COMPLETED] furosemide  20 mg Intravenous Once  . [COMPLETED] furosemide  20 mg Intravenous Once  . furosemide  40 mg Oral Daily  . gabapentin  100 mg Oral QHS  . insulin aspart  10 Units Subcutaneous Q breakfast  . insulin aspart  20 Units Subcutaneous BID AC  . insulin glargine  30 Units Subcutaneous BID  . isosorbide mononitrate  60 mg Oral Daily  . methylPREDNISolone (SOLU-MEDROL) injection  80 mg Intravenous Q24H  . multivitamin with minerals  1 tablet Oral Daily  . pantoprazole  40 mg Oral QAC breakfast  . sodium chloride  3 mL Intravenous Q12H  . tiotropium  18 mcg Inhalation Daily  . [DISCONTINUED] acetaminophen  1,300 mg Oral BID  . [DISCONTINUED] acetaminophen  1,300 mg Oral BID  . [DISCONTINUED] folic acid  400 mcg Oral Daily  . [DISCONTINUED] gabapentin  100 mg Oral QHS  . [DISCONTINUED] multivitamin with minerals  1 tablet Oral BID  . [DISCONTINUED] predniSONE  5 mg Oral Daily  . [DISCONTINUED] tiotropium  18 mcg Inhalation Daily   Continuous Infusions:   Principal Problem:  *Anemia Active Problems:  DM  OSA- on c-pap  GI BLEED, 12/12- gastric AVM ablated  Gout  PVD, widespread, ISR bilat Iliac stents, re stented 12/11/11  S/P CABG x 3 '97, PCI '09, cath 4/11- medical Rx  HTN (hypertension)  Acute on chronic diastolic heart failure  Obesity  Chronic renal insufficiency, stage III (moderate) - Baseline creatinine roughly 1.6  Diastolic dysfunction, - grade 2 by echo 01/01/12  Gastric and duodenal angiodysplasia with hemorrhage  Chest pain  CAD (coronary artery disease)  Shortness of breath       Levora Werden  [facility] Pager [phone]. If 8PM-8AM, please contact night-coverage at [email or web address], password [facility] 04/14/2012, 5:47 PM  LOS: 1 day

## 2012-04-14 NOTE — Progress Notes (Signed)
Hypoglycemic Event  CBG: 43  Treatment: 15 GM carbohydrate snack  Symptoms: None  Follow-up CBG: Time:12:20 CBG Result: 56 Possible Reasons for Event: Inadequate meal intake   [name]  Remember to initiate Hypoglycemia Order Set & complete  Hypoglycemic Event  CBG: 53  Treatment: 15 GM carbohydrate snack  Symptoms: None  Follow-up CBG: Time:12:35 CBG Result: 99  Possible Reasons for Event: Inadequate meal intake     [name]  Remember to initiate Hypoglycemia Order Set & complete

## 2012-04-14 NOTE — Progress Notes (Signed)
Per patient's wife, "Don't give him his lasix pill. I actually gave it to him 30 minutes ago because he felt like he had fluid in his lungs and needed it immediately." Wife states she administered 40 mg furosemide (home medication). Pt s scheduled Furosemide 40 mg PO withheld and provided patient education about improper medication administration. Also instructed pt on the effects of furosemide. Will continue to monitor and educate. [name]

## 2012-04-14 NOTE — Progress Notes (Signed)
Received lab results from [facility]; forwarded to Dr. Gaylyn Rong.

## 2012-04-15 DIAGNOSIS — G4733 Obstructive sleep apnea (adult) (pediatric): Secondary | ICD-10-CM

## 2012-04-15 DIAGNOSIS — R0602 Shortness of breath: Secondary | ICD-10-CM

## 2012-04-15 LAB — BASIC METABOLIC PANEL
BUN: 25 mg/dL — ABNORMAL HIGH (ref 6–23)
Creatinine, Ser: 1.51 mg/dL — ABNORMAL HIGH (ref 0.50–1.35)
GFR calc Af Amer: 50 mL/min — ABNORMAL LOW (ref >90)
GFR calc non Af Amer: 43 mL/min — ABNORMAL LOW (ref >90)

## 2012-04-15 MED ORDER — LEVOFLOXACIN 500 MG PO TABS: 500.0000 mg | ORAL_TABLET | Freq: Every day | ORAL | Status: DC

## 2012-04-15 MED ORDER — PREDNISONE 5 MG PO TABS: 5.0000 mg | ORAL_TABLET | Freq: Every day | ORAL | Status: DC

## 2012-04-15 MED ORDER — FUROSEMIDE 10 MG/ML IJ SOLN
40.0000 mg | Freq: Once | INTRAMUSCULAR | Status: AC
  Administered 2012-04-15: 40 mg via INTRAVENOUS
  Filled 2012-04-15: qty 4

## 2012-04-15 MED ORDER — NYSTATIN 100000 UNIT/ML MT SUSP
500000.0000 [IU] | Freq: Four times a day (QID) | OROMUCOSAL | Status: DC
  Administered 2012-04-15: 500000 [IU] via ORAL
  Filled 2012-04-15 (×4): qty 5

## 2012-04-15 MED ORDER — PREDNISONE (PAK) 10 MG PO TABS: 10.0000 mg | ORAL_TABLET | ORAL | Status: DC

## 2012-04-15 NOTE — Progress Notes (Signed)
[name] discharged Home per MD order.  Discharge instructions reviewed and discussed with the patient and daughter , all questions and concerns answered. Copy of instructions and scripts given to patient.   Thomas, Knox  Home Medication Instructions [id number]   Printed on:04/15/12 1417  Medication Information                    albuterol (PROVENTIL) (2.5 MG/3ML) 0.083% nebulizer solution Take 2.5 mg by nebulization every 6 (six) hours as needed. For shortness of breath           colchicine 0.6 MG tablet Take 0.6 mg by mouth daily.            folic acid (FOLVITE) 400 MCG tablet Take 400 mcg by mouth daily.             gabapentin (NEURONTIN) 100 MG tablet Take 100 mg by mouth at bedtime.             nitroGLYCERIN (NITROSTAT) 0.4 MG SL tablet Place 0.4 mg under the tongue every 5 (five) minutes as needed. May repeat x 3 for chest pain           pantoprazole (PROTONIX) 40 MG tablet Take 40 mg by mouth daily.             ezetimibe (ZETIA) 10 MG tablet Take 10 mg by mouth daily.             acetaminophen (TYLENOL) 650 MG CR tablet Take 1,300 mg by mouth 2 (two) times daily.            isosorbide mononitrate (IMDUR) 30 MG 24 hr tablet Take 60 mg by mouth daily.            tiotropium (SPIRIVA) 18 MCG inhalation capsule Place 18 mcg into inhaler and inhale daily.            albuterol (PROAIR HFA) 108 (90 BASE) MCG/ACT inhaler Inhale 2 puffs into the lungs every 4 (four) hours as needed for wheezing or shortness of breath.           amLODipine (NORVASC) 10 MG tablet Take 1 tablet (10 mg total) by mouth daily.           cloNIDine HCl (KAPVAY) 0.1 MG TB12 ER tablet Take 1 tablet (0.1 mg total) by mouth 2 (two) times daily.           Multiple Vitamin (MULTIVITAMIN WITH MINERALS) TABS Take 0.5 tablets by mouth 2 (two) times daily.           allopurinol (ZYLOPRIM) 300 MG tablet Take 300 mg by mouth 2 (two) times daily.           atorvastatin (LIPITOR) 20 MG  tablet Take 20 mg by mouth at bedtime.           lisinopril (PRINIVIL,ZESTRIL) 5 MG tablet Take 1 tablet (5 mg total) by mouth daily.           furosemide (LASIX) 40 MG tablet Take 1 tablet (40 mg total) by mouth daily.           aspirin EC 325 MG tablet Take 325 mg by mouth daily.           insulin glargine (LANTUS SOLOSTAR) 100 UNIT/ML injection Inject 30 Units into the skin 2 (two) times daily.           escitalopram (LEXAPRO) 20 MG tablet Take 20 mg by mouth daily.  insulin aspart (NOVOLOG FLEXPEN) 100 UNIT/ML injection Inject 10-20 Units into the skin 3 (three) times daily before meals. 10 units with breakfast, 20 units with lunch, and 20 units with supper           nystatin (MYCOSTATIN) 100000 UNIT/ML suspension Swish and swallow 500,000 Units 4 (four) times daily.            levofloxacin (LEVAQUIN) 500 MG tablet Take 1 tablet (500 mg total) by mouth daily.           predniSONE (DELTASONE) 5 MG tablet Take 1 tablet (5 mg total) by mouth daily.           predniSONE (STERAPRED UNI-PAK) 10 MG tablet Take 1 tablet (10 mg total) by mouth See admin instructions.             Patients skin is clean, dry and intact, no evidence of skin break down. IV site discontinued and catheter remains intact. Site without signs and symptoms of complications. Dressing and pressure applied.  Patient escorted to car by volunteer in a wheelchair,  no distress noted upon discharge.  [name] 04/15/2012 2:17 PM

## 2012-04-15 NOTE — Telephone Encounter (Signed)
Pt has an appointment with Dr. Gaylyn Rong on 04-20-2012 at 11 Am.

## 2012-04-15 NOTE — Discharge Summary (Signed)
Physician Discharge Summary  [name] MVH:[id number] DOB: [birth year] DOA: 04/13/2012  PCP: Kaleen Mask, MD  Admit date: 04/13/2012 Discharge date: 04/15/2012  Time spent: 40 minutes  Recommendations for Outpatient Follow-up:  1. Outpatient hematology follow up  2. 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, cath 4/11- medical Rx  HTN (hypertension)  Acute on chronic diastolic heart failure  Obesity  Chronic renal insufficiency, stage III (moderate) - Baseline creatinine roughly 1.6  Diastolic dysfunction, - grade 2 by echo 01/01/12  Gastric and duodenal angiodysplasia with hemorrhage  CAD (coronary artery disease)     Discharge Condition: Good, patient ambulated the hallways without hypoxemia  Diet recommendation: heart healthy   Filed Weights   04/14/12 0520 04/14/12 2104  Weight: 107.004 kg (235 lb 14.4 oz) 107 kg (235 lb 14.3 oz)    History of present illness:  The patient is a 78yo male with history of CAD s/p CABG and mutliple stents, PVD s/p multiple stents, COPD, AVM with recurrent GIB with recent EGD 1 month ago, stage III renal failure requiring intermittent dialysis in the past who presents with shortness of breath, fatigue and worsening anemia. He has not felt well since August when he had iliac stenting/angioplaty. In October, he was hospitalized for GIB. He had an upper endoscopy for GIB by Dr. Arlyce Dice and had several AVMs cauterized. He was started on iron pills, but was not able to tolerate them because of nausea. He was rehospitalized two weeks ago with nausea, vomiting, diarrhea, dehydration, and AKI, which improved with hydration. His iron was stopped and he has not had such  severe symptoms since that time, however, he continues to have dry heaves at home in the morning which improve over the course of the day.  On 11/18, he had bloodwork done by his PCP which demonstrated worsening anemia and when the clinic staff spoke to him, he endorsed feeling more short of breath recently (symptomatic anemia) and they recommended transfusion. He is being directly admitted for blood transfusion. He has been having daily bowel movements that have not have blood or melena. He has been taking 3L of oxygen at home because of increasing shortness of breath and he has been having intermittent chest pains, left chest, moderate severity associated with his SOB and nausea. He states his is chronic, however, his SOB has been worse and he has had increased lower extremity edema and fatigue this week also. Chest pains are associated with palpitations.  Per family, he had a low grade fever two days ago with sore throat and mild left ear pain.      Hospital Course:  1. multifactorial anemia - patient has had multiple workups in the past, also endoscopic procedures, and he has required transfusions before. He is scheduled to see hematologist next week. We proceeded with transfusion of 2 units packed red blood cells for symptom control.  2. acute on chronic dyspnea - multifactorial. After correction of anemia patient remained with dyspnea. We have also made sure that he was dry by diuresing him with Lasix IV. We divided a pulmonary consultation and they agreed that he was having an asthma/bronchiectasis exacerbation. The patient was placed on steroid taper and antibiotic with  gradual improvement of his symptoms. The pulmonologist also recommended the patient has a outpatient followup to consider new sleep study.   Procedures:  Ct chest without contrast   Blood transfusions  Consultations:  Pulmonary  Discharge Exam: Filed Vitals:   04/15/12 0536 04/15/12 0945 04/15/12 0958 04/15/12 1100    BP: 123/45  131/52   Pulse: 58  75   Temp: 97.5 F (36.4 C)  97.7 F (36.5 C)   TempSrc: Axillary     Resp: 18  21   Height:      Weight:      SpO2: 98% 99% 98% 91%    General: Alert and oriented x3 Cardiovascular:  Regular rate and rhythm without murmurs rubs or gallops Respiratory: Prolonged expiratory phase, no significant wheezes  Discharge Instructions  Discharge Orders    Future Appointments: Provider: Department: Dept Phone: Center:   04/20/2012 10:30 AM [facility] [phone] None   04/20/2012 10:45 AM Krista Blue [facility] MEDICAL ONCOL[facility] [phone] None   04/20/2012 11:00 AM Exie Parody, MD [facility] MEDICAL ONCOLOGY [phone] None   04/26/2012 4:15 PM Waymon Budge, MD [facility] [phone] None   07/11/2012 9:30 AM Waymon Budge, MD [facility] [phone] None       Medication List     As of 04/15/2012 11:54 AM    TAKE these medications         acetaminophen 650 MG CR tablet   Commonly known as: TYLENOL   Take 1,300 mg by mouth 2 (two) times daily.      albuterol (2.5 MG/3ML) 0.083% nebulizer solution   Commonly known as: PROVENTIL   Take 2.5 mg by nebulization every 6 (six) hours as needed. For shortness of breath      albuterol 108 (90 BASE) MCG/ACT inhaler   Commonly known as: PROVENTIL HFA;VENTOLIN HFA   Inhale 2 puffs into the lungs every 4 (four) hours as needed for wheezing or shortness of breath.      allopurinol 300 MG tablet   Commonly known as: ZYLOPRIM   Take 300 mg by mouth 2 (two) times daily.      amLODipine 10 MG tablet   Commonly known as: NORVASC   Take 1 tablet (10 mg total) by mouth daily.      aspirin EC 325 MG tablet   Take 325 mg by mouth daily.      atorvastatin 20 MG tablet   Commonly known as: LIPITOR   Take 20 mg by mouth at bedtime.      cloNIDine HCl 0.1 MG Tb12 ER tablet    Commonly known as: KAPVAY   Take 1 tablet (0.1 mg total) by mouth 2 (two) times daily.      colchicine 0.6 MG tablet   Take 0.6 mg by mouth daily.      escitalopram 20 MG tablet   Commonly known as: LEXAPRO   Take 20 mg by mouth daily.      ezetimibe 10 MG tablet   Commonly known as: ZETIA   Take 10 mg by mouth daily.      folic acid 400 MCG tablet   Commonly known as: FOLVITE   Take 400 mcg by mouth daily.      furosemide 40 MG tablet   Commonly known as: LASIX   Take 1 tablet (40 mg total) by mouth daily.      gabapentin 100 MG  tablet   Commonly known as: NEURONTIN   Take 100 mg by mouth at bedtime.      isosorbide mononitrate 30 MG 24 hr tablet   Commonly known as: IMDUR   Take 60 mg by mouth daily.      LANTUS SOLOSTAR 100 UNIT/ML injection   Generic drug: insulin glargine   Inject 30 Units into the skin 2 (two) times daily.      levofloxacin 500 MG tablet   Commonly known as: LEVAQUIN   Take 1 tablet (500 mg total) by mouth daily.      lisinopril 5 MG tablet   Commonly known as: PRINIVIL,ZESTRIL   Take 1 tablet (5 mg total) by mouth daily.      multivitamin with minerals Tabs   Take 0.5 tablets by mouth 2 (two) times daily.      nitroGLYCERIN 0.4 MG SL tablet   Commonly known as: NITROSTAT   Place 0.4 mg under the tongue every 5 (five) minutes as needed. May repeat x 3 for chest pain      NOVOLOG FLEXPEN 100 UNIT/ML injection   Generic drug: insulin aspart   Inject 10-20 Units into the skin 3 (three) times daily before meals. 10 units with breakfast, 20 units with lunch, and 20 units with supper      nystatin 100000 UNIT/ML suspension   Commonly known as: MYCOSTATIN   Swish and swallow 500,000 Units 4 (four) times daily.      pantoprazole 40 MG tablet   Commonly known as: PROTONIX   Take 40 mg by mouth daily.      predniSONE 5 MG tablet   Commonly known as: DELTASONE   Take 1 tablet (5 mg total) by mouth daily.      predniSONE 10 MG tablet    Commonly known as: STERAPRED UNI-PAK   Take 1 tablet (10 mg total) by mouth See admin instructions.      tiotropium 18 MCG inhalation capsule   Commonly known as: SPIRIVA   Place 18 mcg into inhaler and inhale daily.           Follow-up Information    Follow up with Waymon Budge, MD. On 04/26/2012. (415 pm)    Contact information:   [address] [facility] [place] [address] [phone]       Follow up with Jethro Bolus, MD. On 04/20/2012. (11 am)    Contact information:   [address] [place] [address] [phone]           The results of significant diagnostics from this hospitalization (including imaging, microbiology, ancillary and laboratory) are listed below for reference.    Significant Diagnostic Studies: X-ray Chest Pa And Lateral   04/13/2012  *RADIOLOGY REPORT*  Clinical Data: Left base rales.  Fever.  CHEST - 2 VIEW  Comparison: 04/01/2012  Findings: Previous median sternotomy.  Stable mild cardiomegaly. Mild diffuse interstitial edema with septal lines seen peripherally in the lung bases, new since previous exam. No focal airspace consolidation.  Mild central pulmonary vascular congestion as before.  No effusion.  IMPRESSION:  1.  Stable cardiomegaly with mild interstitial edema bilaterally.   Original Report Authenticated By: D. Andria Rhein, MD    Dg Chest 2 View  04/01/2012  *RADIOLOGY REPORT*  Clinical Data: Chest pain and burning; nausea and diarrhea.  CHEST - 2 VIEW  Comparison: Chest radiograph performed 03/03/2012  Findings: The lungs are well-aerated.  Mild vascular congestion is noted.  There  is no evidence of focal opacification, pleural effusion or pneumothorax.  Mild bilateral pleural thickening is again noted.  The heart is mildly enlarged; the patient is status post median sternotomy.  No acute osseous abnormalities are seen.  Focal density overlying the lower thoracic spine likely reflects a bone island.   IMPRESSION: Mild vascular congestion and mild cardiomegaly noted; lungs remain grossly clear.   Original Report Authenticated By: Tonia Ghent, M.D.    Ct Chest Wo Contrast  04/14/2012  *RADIOLOGY REPORT*  Clinical Data: Chronic dyspnea, cough and history of pulmonary fibrosis  CT CHEST WITHOUT CONTRAST  Technique:  Multidetector CT imaging of the chest was performed following the standard protocol without IV contrast.  Comparison: 06/06/2010  Findings: Since the prior study, interstitial thickening has increased, vertically in the lower lungs.  There are now small bilateral effusions.  A small nodule noted in the right lower lobe previously is partly obscured by contiguous interstitial thickening but is otherwise unchanged.  The heart is normal in size.  There are changes from previous CABG surgery and there are prominent coronary artery calcifications. There are prompt to borderline enlarged mediastinal lymph nodes. Reference measurements were made of three.  There is a prevascular node anterior to the lower superior vena cava that measures 14 mm in short axis.  There is a right paratracheal node measuring 10.8 mm in short axis and an azygos region right paratracheal node measuring 10.8 mm short axis.  When compared the prior study, the mediastinal adenopathy has increased.  Limited evaluation of the upper abdomen shows low density left renal lesions are likely cysts.  These are not included on the field of view from the previous study.  There is been a cholecystectomy.  There are degenerative changes of the visualized spine and changes from the median sternotomy.  No osteoblastic or osteolytic lesions.  IMPRESSION: Increased interstitial thickening along with the small effusions is most suggestive of pulmonary edema superimposed on chronic interstitial scarring.  The presence of mildly enlarged mediastinal lymph nodes, new from prior study is nonspecific.  These may be inflammatory or reactive to infection  and thus the possibility of a more diffuse interstitial infectious infiltrate should be considered.  Pulmonary edema is favored.   Original Report Authenticated By: Amie Portland, M.D.    Dg Abd 2 Views  04/03/2012  *RADIOLOGY REPORT*  Clinical Data: Abdominal distension.  ABDOMEN - 2 VIEW  Comparison: 08/04/2008  Findings: Exam demonstrates air filled nondilated loops of large and small bowel with a few scattered nonspecific air-fluid levels. There are surgical clips of the right upper quadrant and single surgical clip over the midline pelvis.  Bilateral iliac artery stents are present.  Lung bases are unremarkable.  There are degenerate changes of the spine and hips.  IMPRESSION: Nonspecific, nonobstructive bowel gas pattern.   Original Report Authenticated By: Elberta Fortis, M.D.     Microbiology: No results found for this or any previous visit (from the past 240 hour(s)).   Labs: Basic Metabolic Panel:  Lab 04/15/12 9604 04/13/12 1504  NA 134* 141  K 5.3* 5.6*  CL 98 107  CO2 26 24  GLUCOSE 244* 200*  BUN 25* 26*  CREATININE 1.51* 1.51*  CALCIUM 9.3 8.9  MG -- 1.7  PHOS -- --   Liver Function Tests:  Lab 04/13/12 1504  AST 51*  ALT 13  ALKPHOS 66  BILITOT 0.3  PROT 6.4  ALBUMIN 3.2*   No results found for this basename: LIPASE:5,AMYLASE:5 in the  last 168 hours No results found for this basename: AMMONIA:5 in the last 168 hours CBC:  Lab 04/14/12 0655 04/13/12 1504  WBC 7.8 6.6  NEUTROABS -- --  HGB 9.1* 7.4*  HCT 28.9* 24.5*  MCV 87.6 88.4  PLT 194 184   Cardiac Enzymes:  Lab 04/14/12 0655 04/13/12 1532  CKTOTAL -- --  CKMB -- --  CKMBINDEX -- --  TROPONINI <0.30 <0.30   BNP: BNP (last 3 results)  Basename 04/13/12 1532 03/03/12 2128 01/02/12 1130  PROBNP 1184.0* 509.1* 945.2*   CBG:  Lab 04/15/12 0749 04/14/12 2105 04/14/12 1651 04/14/12 1255 04/14/12 1235  GLUCAP 173* 89 174* 99 56*       Signed:  Daylen Lipsky  [facility] 04/15/2012,  11:54 AM

## 2012-04-15 NOTE — Progress Notes (Signed)
Pt ambulating on RA. O2 sats = 91%. Denies shortness of breath. [name]

## 2012-04-15 NOTE — Consult Note (Signed)
PULMONARY  / CRITICAL CARE MEDICINE  Name: [name] MRN: [id number] DOB: Sep 28, 1933    LOS: 2  REFERRING PROVIDER:  Lavera Guise  CHIEF COMPLAINT:   Dyspnea   BRIEF PATIENT DESCRIPTION:  78 year old male f/b [place] for chronic multifactorial dyspnea in setting of chronic bronchitis, diastolic dysfxn, anemia and renal insuff. Admitted on 11/20 for progressive dyspnea, fatigue, and anemia. On 11/21 had productive cough and wheeze for which steroids and abx were started. PCCM asked to eval on 11/22 for multifactorial dyspnea.    CULTURES:   ANTIBIOTICS: levaquin 11/21>>>  SIGNIFICANT EVENTS:  CT chest 11/21: Increased interstitial thickening along with the small effusions is most suggestive of pulmonary edema superimposed on chronic interstitial scarring. The presence of mildly enlarged mediastinal lymph nodes, new from prior study is nonspecific. These may be inflammatory or reactive to infection and thus the possibility of amore diffuse interstitial infectious infiltrate should be considered. Pulmonary edema is favored.   HISTORY OF PRESENT ILLNESS:   82 yoM former smoker followed for sleep apnea, bronchitis, complicated by CAD/ CABG/ PAD/ Gr 2 diastolic dysf/ CHF with hx acute and chronic respiratory failure, diverticular bleed with anemia. Gout. Chronic renal insufficiency. He is maintained of chronic prednisone for gout. Admitted on 11/20 w/ CC: progressive fatigue, dyspnea, renal failure and anemia (felt to be d/t gastric AVMs). Treatment has been multi-factorial: blood transfusion and diuresis. On 11/21 noted to have persistent dyspnea in spite of these measures w/ increased wheezing and demonstrating cough w/ purulent sputum for which he was started on empiric abx and systemic steroids.   Marland Kitchen PAST MEDICAL HISTORY :  Past Medical History  Diagnosis Date  . Adenomatous colon polyp   . Diverticulosis   . GERD (gastroesophageal reflux disease)   . Hiatal hernia   . Alzheimer disease     . Gout     "hands; backbone; elbows; shoulders"  . Asthma with bronchitis   . COPD (chronic obstructive pulmonary disease)   . Chronic hypoxemic respiratory failure   . OSA (obstructive sleep apnea)     CPAP  . CHF (congestive heart failure)     grade 2 diastolic dysfunction, EF 65-70% on ECHO 12/2011  . Blood transfusion     "I've had about 6 pints" (12/31/2011)  . Pneumonia     intermittent  . Anemia   . GI bleed     GI Dr. Marina Goodell,   . Coronary artery disease   . Hypertension   . Peripheral vascular disease     PAD  . Dysrhythmia     "skips"  . Anginal pain   . Myocardial infarction     "they said I had 2 light ones"  . Shortness of breath 12/31/2011    "all the time"  . Type II diabetes mellitus   . Arthritis     "plenty"  . Chronic lower back pain   . Renal insufficiency     "went on dialysis probably 3 times" Last 04/2011  . Skin cancer     "forehead; arms"  . Depression   . Diverticulosis   . Colon polyps     adenomatous and hyperplastic  . GERD (gastroesophageal reflux disease)   . Hiatal hernia    Past Surgical History  Procedure Date  . Hand surgery 1990s    "chain saw accident; had to reattach fingers on left hand"  . Esophagogastroduodenoscopy 05/05/2011    Procedure: ESOPHAGOGASTRODUODENOSCOPY (EGD);  Surgeon: Louis Meckel, MD;  Location: [facility] END[facility]COPY;  Service: Endoscopy;  Laterality: N/A;  . Flexible sigmoidoscopy 05/05/2011    Procedure: FLEXIBLE SIGMOIDOSCOPY;  Surgeon: Louis Meckel, MD;  Location: [facility];  Service: Endoscopy;  Laterality: N/A;  . Laparoscopic cholecystectomy ~ 2001  . Excisional hemorrhoidectomy 1950's  . Cataract extraction w/ intraocular lens  implant, bilateral   . Coronary artery bypass graft 1997    CABG X2 vessels  . Iliac artery stent 11/2011    left/H&P  . Renal artery stent 2010    left/H&P  . Iliac artery stent 11/2011    right  . Coronary angioplasty with stent placement 12/31/2011    "I have 15 stents;  heart, kidney, aorta, legs"  . Colonoscopy w/ biopsies and polypectomy     "have had probably 5 cut out" (12/31/2011)  . Enteroscopy 03/04/2012    Procedure: ENTEROSCOPY;  Surgeon: Louis Meckel, MD;  Location: [facility] ENDOSCOPY;  Service: Endoscopy;  Laterality: N/A;  jessica/leone   Prior to Admission medications   Medication Sig Start Date End Date Taking? Authorizing Provider  acetaminophen (TYLENOL) 650 MG CR tablet Take 1,300 mg by mouth 2 (two) times daily.    Yes Historical Provider, MD  albuterol (PROAIR HFA) 108 (90 BASE) MCG/ACT inhaler Inhale 2 puffs into the lungs every 4 (four) hours as needed for wheezing or shortness of breath. 10/08/11 10/07/12 Yes Clinton D Young, MD  albuterol (PROVENTIL) (2.5 MG/3ML) 0.083% nebulizer solution Take 2.5 mg by nebulization every 6 (six) hours as needed. For shortness of breath   Yes Historical Provider, MD  allopurinol (ZYLOPRIM) 300 MG tablet Take 300 mg by mouth 2 (two) times daily.   Yes Historical Provider, MD  amLODipine (NORVASC) 10 MG tablet Take 1 tablet (10 mg total) by mouth daily. 12/11/11 12/10/12 Yes Wilburt Finlay, PA  aspirin EC 325 MG tablet Take 325 mg by mouth daily.   Yes Historical Provider, MD  atorvastatin (LIPITOR) 20 MG tablet Take 20 mg by mouth at bedtime.   Yes Historical Provider, MD  cloNIDine HCl (KAPVAY) 0.1 MG TB12 ER tablet Take 1 tablet (0.1 mg total) by mouth 2 (two) times daily. 12/11/11  Yes Wilburt Finlay, PA  colchicine 0.6 MG tablet Take 0.6 mg by mouth daily.    Yes Historical Provider, MD  escitalopram (LEXAPRO) 20 MG tablet Take 20 mg by mouth daily.   Yes Historical Provider, MD  ezetimibe (ZETIA) 10 MG tablet Take 10 mg by mouth daily.     Yes Historical Provider, MD  folic acid (FOLVITE) 400 MCG tablet Take 400 mcg by mouth daily.     Yes Historical Provider, MD  furosemide (LASIX) 40 MG tablet Take 1 tablet (40 mg total) by mouth daily. 04/04/12  Yes Meredeth Ide, MD  gabapentin (NEURONTIN) 100 MG tablet Take 100  mg by mouth at bedtime.     Yes Historical Provider, MD  insulin aspart (NOVOLOG FLEXPEN) 100 UNIT/ML injection Inject 10-20 Units into the skin 3 (three) times daily before meals. 10 units with breakfast, 20 units with lunch, and 20 units with supper   Yes Historical Provider, MD  insulin glargine (LANTUS SOLOSTAR) 100 UNIT/ML injection Inject 30 Units into the skin 2 (two) times daily.   Yes Historical Provider, MD  isosorbide mononitrate (IMDUR) 30 MG 24 hr tablet Take 60 mg by mouth daily.  03/09/11  Yes Historical Provider, MD  Multiple Vitamin (MULTIVITAMIN WITH MINERALS) TABS Take 0.5 tablets by mouth 2 (two) times daily.   Yes Historical Provider, MD  nystatin (MYCOSTATIN) 100000 UNIT/ML suspension Swish and swallow 500,000 Units 4 (four) times daily.    Yes Historical Provider, MD  pantoprazole (PROTONIX) 40 MG tablet Take 40 mg by mouth daily.     Yes Historical Provider, MD  predniSONE (DELTASONE) 5 MG tablet Take 5 mg by mouth daily.    Yes Historical Provider, MD  tiotropium (SPIRIVA) 18 MCG inhalation capsule Place 18 mcg into inhaler and inhale daily.  04/21/11 04/20/12 Yes Clinton D Young, MD  lisinopril (PRINIVIL,ZESTRIL) 5 MG tablet Take 1 tablet (5 mg total) by mouth daily. 03/11/12 03/11/13  Marinda Elk, MD  nitroGLYCERIN (NITROSTAT) 0.4 MG SL tablet Place 0.4 mg under the tongue every 5 (five) minutes as needed. May repeat x 3 for chest pain    Historical Provider, MD   Allergies  Allergen Reactions  . Donepezil Diarrhea  . Heparin Itching and Rash  . Hydralazine Other (See Comments)    DO NOT TAKE PER MD  . Hydromorphone Itching  . Morphine Itching  . Plavix (Clopidogrel Bisulfate) Other (See Comments)    GI BLEEDING; "so bad I had to take blood; dr took me off it"  . Promethazine Other (See Comments)    respiratory failure  . Sulfonamide Derivatives Itching  . Zaroxolyn (Metolazone) Diarrhea  . Cefuroxime Other (See Comments)    REACTION: unkown reaction  .  Metoprolol Other (See Comments)    unknown  . Ertapenem Itching and Rash    12/31/2011 pt does not recall allergy or severity    FAMILY HISTORY:  Family History  Problem Relation Age of Onset  . Lung cancer Brother   . Diabetes Mother   . Heart disease Sister   . Stomach cancer Maternal Grandfather   . Heart disease Father   . Heart disease Brother   . Arthritis Mother   . ALS Sister   . ALS      neice  . ALS      nephew  . Colon cancer Neg Hx    SOCIAL HISTORY:  reports that he quit smoking about 21 years ago. His smoking use included Cigarettes. He has a 112.5 pack-year smoking history. He quit smokeless tobacco use about 21 years ago. His smokeless tobacco use included Chew. He reports that he does not drink alcohol or use illicit drugs.  REVIEW OF SYSTEMS:   Constitutional: + for fever (on admit), chills, weight loss, malaise/fatigue and diaphoresis.  HENT: Negative for hearing loss, ear pain, nosebleeds, congestion, + sore throat, - neck pain, + hoarse phonation, - tinnitus and ear discharge.   Eyes: Negative for blurred vision, double vision, photophobia, pain, discharge and redness.  Respiratory: +chronic cough, - hemoptysis, no sig sputum production, + shortness of breath, improved w/ BDs, steroids and abx. + wheezing and stridor.  Has had several weeks where he has had progressive dyspnea and feeling that he was smothering w/ BIPAP Cardiovascular: Negative for chest pain, palpitations, orthopnea, claudication,+ leg swelling and PND.  Gastrointestinal: Negative for heartburn, nausea, vomiting, abdominal pain, diarrhea, constipation, blood in stool and melena.  Genitourinary: Negative for dysuria, urgency, frequency, hematuria and flank pain.  Musculoskeletal: Negative for myalgias, back pain, joint pain and falls.  Skin: Negative for itching and rash.  Neurological: Negative for dizziness, tingling, tremors, sensory change, speech change, focal weakness, seizures, loss of  consciousness, weakness and headaches.  Endo/Heme/Allergies: Negative for environmental allergies and polydipsia. Does not bruise/bleed easily.   VITAL SIGNS: Temp:  [97.3 F (36.3 C)-99.9 F (37.7  C)] 97.7 F (36.5 C) (11/22 0958) Pulse Rate:  [58-75] 75  (11/22 0958) Resp:  [18-21] 21  (11/22 0958) BP: (109-135)/(43-54) 131/52 mmHg (11/22 0958) SpO2:  [94 %-99 %] 98 % (11/22 0958) FiO2 (%):  [32 %] 32 % (11/21 2048) Weight:  [107 kg (235 lb 14.3 oz)] 107 kg (235 lb 14.3 oz) (11/21 2104)  PHYSICAL EXAMINATION: General:  Chronically ill appearing White male.  Neuro:  Awake, oriented, no focal def HEENT:  Neck large, hoarse phonation, no JVD Cardiovascular: regular with some PVC's, harsh systolic M with intact S2 Lungs:  Clear superiorly, few scattered LL crackles, no wheezing Abdomen:  Obese, + bowel sounds Musculoskeletal:  intact Skin:  Chronic edema in LEs.    Lab 04/15/12 0445 04/13/12 1504  NA 134* 141  K 5.3* 5.6*  CL 98 107  CO2 26 24  BUN 25* 26*  CREATININE 1.51* 1.51*  GLUCOSE 244* 200*    Lab 04/14/12 0655 04/13/12 1504  HGB 9.1* 7.4*  HCT 28.9* 24.5*  WBC 7.8 6.6  PLT 194 184   X-ray Chest Pa And Lateral   04/13/2012  *RADIOLOGY REPORT*  Clinical Data: Left base rales.  Fever.  CHEST - 2 VIEW  Comparison: 04/01/2012  Findings: Previous median sternotomy.  Stable mild cardiomegaly. Mild diffuse interstitial edema with septal lines seen peripherally in the lung bases, new since previous exam. No focal airspace consolidation.  Mild central pulmonary vascular congestion as before.  No effusion.  IMPRESSION:  1.  Stable cardiomegaly with mild interstitial edema bilaterally.   Original Report Authenticated By: D. Andria Rhein, MD    Ct Chest Wo Contrast  04/14/2012  *RADIOLOGY REPORT*  Clinical Data: Chronic dyspnea, cough and history of pulmonary fibrosis  CT CHEST WITHOUT CONTRAST  Technique:  Multidetector CT imaging of the chest was performed following the  standard protocol without IV contrast.  Comparison: 06/06/2010  Findings: Since the prior study, interstitial thickening has increased, vertically in the lower lungs.  There are now small bilateral effusions.  A small nodule noted in the right lower lobe previously is partly obscured by contiguous interstitial thickening but is otherwise unchanged.  The heart is normal in size.  There are changes from previous CABG surgery and there are prominent coronary artery calcifications. There are prompt to borderline enlarged mediastinal lymph nodes. Reference measurements were made of three.  There is a prevascular node anterior to the lower superior vena cava that measures 14 mm in short axis.  There is a right paratracheal node measuring 10.8 mm in short axis and an azygos region right paratracheal node measuring 10.8 mm short axis.  When compared the prior study, the mediastinal adenopathy has increased.  Limited evaluation of the upper abdomen shows low density left renal lesions are likely cysts.  These are not included on the field of view from the previous study.  There is been a cholecystectomy.  There are degenerative changes of the visualized spine and changes from the median sternotomy.  No osteoblastic or osteolytic lesions.  IMPRESSION: Increased interstitial thickening along with the small effusions is most suggestive of pulmonary edema superimposed on chronic interstitial scarring.  The presence of mildly enlarged mediastinal lymph nodes, new from prior study is nonspecific.  These may be inflammatory or reactive to infection and thus the possibility of a more diffuse interstitial infectious infiltrate should be considered.  Pulmonary edema is favored.   Original Report Authenticated By: Amie Portland, M.D.     ASSESSMENT / PLAN:  Multifactorial dyspnea: bronchiectasis w/ fixed airflow limitations, possible BTX flare, decompensated diastolic dysfxn, non-compliance w/ CPAP raising concern also for secondary  PAH.  Also chronic anemia Agree. Has h/o chronic bronchitis and CT scan suggests progression of bronchiectasis in lower airways. Suspect that this explains his fixed mild airflow limitation by PFTs 1 year ago, likely underestimated on spiro (due to coexisting restriction). In addition he tells [place] he has not been wearing his CPAP device due to feeling smothered for weeks. Think that this as well was contributing to his dyspnea. His renal insuff, grd 2 diastolic heart failure,and anemia are also contributing factors.  rec Cont steroid taper Cont empiric abx for possible bronchitis 7d should be adequate for btx flare  Cont scheduled BDs (think he can be maintained on spiriva-->will stop symbicort) Cont diuresis Needs repeat PSG/home titration  Needs walking oximetry before discharge to r/o occult hypoxemia  OSA Plan: Cont nocturnal CPAP-->needs repeat PSG, not convinced that he has adequate pressure. This can probably be done as a home titration study, to be followed by Dr Maple Hudson  Diastolic dysfunction Plan -cont current rx -further recs per cards.   Chronic renal insufficiency  Plan Cont diuresis as tol.  Anemia  Likely from chronic GI AVMs. S/p transfusion X 2 units on 11/20.  Plan: Per primary service    Levy Pupa, MD, PhD 04/15/2012, 11:30 AM [facility] [phone] or if no answer [phone]

## 2012-04-20 ENCOUNTER — Other Ambulatory Visit (HOSPITAL_BASED_OUTPATIENT_CLINIC_OR_DEPARTMENT_OTHER): Payer: BC Managed Care – PPO | Admitting: Lab

## 2012-04-20 ENCOUNTER — Encounter: Payer: Self-pay | Admitting: Oncology

## 2012-04-20 ENCOUNTER — Ambulatory Visit (HOSPITAL_BASED_OUTPATIENT_CLINIC_OR_DEPARTMENT_OTHER): Payer: BC Managed Care – PPO | Admitting: Oncology

## 2012-04-20 ENCOUNTER — Ambulatory Visit: Payer: BC Managed Care – PPO

## 2012-04-20 ENCOUNTER — Telehealth: Payer: Self-pay | Admitting: Oncology

## 2012-04-20 VITALS — BP 139/62 | HR 79 | Temp 97.0°F | Resp 20 | Ht 67.0 in | Wt 235.5 lb

## 2012-04-20 DIAGNOSIS — D649 Anemia, unspecified: Secondary | ICD-10-CM

## 2012-04-20 DIAGNOSIS — J449 Chronic obstructive pulmonary disease, unspecified: Secondary | ICD-10-CM

## 2012-04-20 DIAGNOSIS — N182 Chronic kidney disease, stage 2 (mild): Secondary | ICD-10-CM

## 2012-04-20 DIAGNOSIS — D500 Iron deficiency anemia secondary to blood loss (chronic): Secondary | ICD-10-CM

## 2012-04-20 DIAGNOSIS — I10 Essential (primary) hypertension: Secondary | ICD-10-CM

## 2012-04-20 LAB — CBC WITH DIFFERENTIAL/PLATELET
BASO%: 0.1 % (ref 0.0–2.0)
Basophils Absolute: 0 10*3/uL (ref 0.0–0.1)
HCT: 29.5 % — ABNORMAL LOW (ref 38.4–49.9)
HGB: 9.3 g/dL — ABNORMAL LOW (ref 13.0–17.1)
LYMPH%: 6.7 % — ABNORMAL LOW (ref 14.0–49.0)
MCH: 27.2 pg (ref 27.2–33.4)
MCHC: 31.5 g/dL — ABNORMAL LOW (ref 32.0–36.0)
MONO#: 0.5 10*3/uL (ref 0.1–0.9)
NEUT%: 86.9 % — ABNORMAL HIGH (ref 39.0–75.0)
Platelets: 259 10*3/uL (ref 140–400)
WBC: 8.4 10*3/uL (ref 4.0–10.3)

## 2012-04-20 LAB — MORPHOLOGY: PLT EST: ADEQUATE

## 2012-04-20 LAB — CHCC SMEAR

## 2012-04-20 NOTE — Progress Notes (Signed)
Checked in new patient. No financial issues. °

## 2012-04-20 NOTE — Progress Notes (Signed)
[facility]  Telephone:([phone] Fax:([phone]     INITIAL HEMATOLOGY CONSULTATION    Referral MD:  Dr. Kaleen Mask, M.D.  Reason for Referral: chronic anemia.     HPI:  [name]. [name] is a 78 year-old man with multiple comorbidities.  His oldest available CBC dated back to 12/29/2006 with Hgb 14.  Ever since, he has had anemia.  In 2012, he was found to have worsening anemia from bleeding AVM.  His Plavix was discontinued.  He received IV iron in 2012 with slight improvement of his Hgb.  However, this past year, his Hgb has trended down again and he had his AVM cauterized last month.  He required pRBC transfusion with improvement of his anemia symptoms.  He was kindly referred to the [facility] for further evaluation.  [name] presented to the clinic for the first time today with his wife.  He reported that he used to see dark stool last month which has now resolved.  He had dizziness, SOB which have resolved with blood transfusion.  He has much less edema now compared to last month.  He thinks that he feels the best now in 2 years.  Patient denies fever, anorexia, weight loss, fatigue, headache, visual changes, confusion, drenching night sweats, palpable lymph node swelling, mucositis, odynophagia, dysphagia, nausea vomiting, jaundice, chest pain, palpitation, shortness of breath, dyspnea on exertion, productive cough, gum bleeding, epistaxis, hematemesis, hemoptysis, abdominal pain, abdominal swelling, early satiety, melena, hematochezia, hematuria, skin rash, spontaneous bleeding, joint swelling, joint pain, heat or cold intolerance, bowel bladder incontinence, back pain, focal motor weakness, paresthesia, depression, suicidal or homicidal ideation, feeling hopelessness.     Past Medical History  Diagnosis Date  . Adenomatous colon polyp   . Diverticulosis   . GERD (gastroesophageal reflux disease)   . Hiatal hernia   . Alzheimer  disease   . Gout     "hands; backbone; elbows; shoulders"  . Asthma with bronchitis   . COPD (chronic obstructive pulmonary disease)   . Chronic hypoxemic respiratory failure   . OSA (obstructive sleep apnea)     CPAP  . CHF (congestive heart failure)     grade 2 diastolic dysfunction, EF 65-70% on ECHO 12/2011  . Blood transfusion     "I've had about 6 pints" (12/31/2011)  . Pneumonia     intermittent  . Anemia   . GI bleed     GI Dr. Marina Goodell,   . Hypertension   . Peripheral vascular disease     PAD  . Dysrhythmia     "skips"  . Anginal pain   . Myocardial infarction     "they said I had 2 light ones"  . Shortness of breath 12/31/2011    "all the time"  . Type II diabetes mellitus     with complications:  retinopathy, nephrophathy.   . Arthritis     "plenty"  . Chronic lower back pain   . Renal insufficiency     "went on dialysis probably 3 times" Last 04/2011  . Skin cancer     "forehead; arms"  . Depression   . Diverticulosis   . Colon polyps     adenomatous and hyperplastic  . GERD (gastroesophageal reflux disease)   . Hiatal hernia   :    Past Surgical History  Procedure Date  . Hand surgery 1990s    "chain saw accident; had to reattach fingers on left hand"  . Esophagogastroduodenoscopy 05/05/2011  Procedure: ESOPHAGOGASTRODUODENOSCOPY (EGD);  Surgeon: Louis Meckel, MD;  Location: [facility];  Service: Endoscopy;  Laterality: N/A;  . Flexible sigmoidoscopy 05/05/2011    Procedure: FLEXIBLE SIGMOIDOSCOPY;  Surgeon: Louis Meckel, MD;  Location: [facility] ENDOSCOPY;  Service: Endoscopy;  Laterality: N/A;  . Laparoscopic cholecystectomy ~ 2001  . Excisional hemorrhoidectomy 1950's  . Cataract extraction w/ intraocular lens  implant, bilateral   . Coronary artery bypass graft 1997    CABG X2 vessels  . Iliac artery stent 11/2011    left/H&P  . Renal artery stent 2010    left/H&P  . Iliac artery stent 11/2011    right  . Coronary angioplasty with stent  placement 12/31/2011    "I have 15 stents; heart, kidney, aorta, legs"  . Colonoscopy w/ biopsies and polypectomy     "have had probably 5 cut out" (12/31/2011)  . Enteroscopy 03/04/2012    Procedure: ENTEROSCOPY;  Surgeon: Louis Meckel, MD;  Location: [facility] ENDOSCOPY;  Service: Endoscopy;  Laterality: N/A;  jessica/leone  . Coronary artery bypass graft 1997  :   CURRENT MEDS: Current Outpatient Prescriptions  Medication Sig Dispense Refill  . acetaminophen (TYLENOL) 650 MG CR tablet Take 1,300 mg by mouth 2 (two) times daily.       Marland Kitchen albuterol (PROAIR HFA) 108 (90 BASE) MCG/ACT inhaler Inhale 2 puffs into the lungs every 4 (four) hours as needed for wheezing or shortness of breath.  3 Inhaler  3  . albuterol (PROVENTIL) (2.5 MG/3ML) 0.083% nebulizer solution Take 2.5 mg by nebulization every 6 (six) hours as needed. For shortness of breath      . allopurinol (ZYLOPRIM) 300 MG tablet Take 300 mg by mouth 2 (two) times daily.      Marland Kitchen amLODipine (NORVASC) 10 MG tablet Take 1 tablet (10 mg total) by mouth daily.  30 tablet  5  . aspirin EC 325 MG tablet Take 325 mg by mouth daily.      Marland Kitchen atorvastatin (LIPITOR) 20 MG tablet Take 20 mg by mouth at bedtime.      . cloNIDine HCl (KAPVAY) 0.1 MG TB12 ER tablet Take 1 tablet (0.1 mg total) by mouth 2 (two) times daily.  60 tablet  5  . colchicine 0.6 MG tablet Take 0.6 mg by mouth daily.       Marland Kitchen escitalopram (LEXAPRO) 20 MG tablet Take 20 mg by mouth daily.      Marland Kitchen ezetimibe (ZETIA) 10 MG tablet Take 10 mg by mouth daily.        . folic acid (FOLVITE) 400 MCG tablet Take 400 mcg by mouth daily.        . furosemide (LASIX) 40 MG tablet Take 1 tablet (40 mg total) by mouth daily.  30 tablet  2  . gabapentin (NEURONTIN) 100 MG tablet Take 100 mg by mouth at bedtime.        . insulin aspart (NOVOLOG FLEXPEN) 100 UNIT/ML injection Inject 10-20 Units into the skin 3 (three) times daily before meals. 10 units with breakfast, 20 units with lunch, and 20 units  with supper      . insulin glargine (LANTUS SOLOSTAR) 100 UNIT/ML injection Inject 30 Units into the skin 2 (two) times daily.      . isosorbide mononitrate (IMDUR) 30 MG 24 hr tablet Take 60 mg by mouth daily.       Marland Kitchen levofloxacin (LEVAQUIN) 500 MG tablet Take 1 tablet (500 mg total) by mouth daily.  10 tablet  0  . lisinopril (PRINIVIL,ZESTRIL) 5 MG tablet Take 1 tablet (5 mg total) by mouth daily.  30 tablet  11  . Multiple Vitamin (MULTIVITAMIN WITH MINERALS) TABS Take 0.5 tablets by mouth 2 (two) times daily.      . nitroGLYCERIN (NITROSTAT) 0.4 MG SL tablet Place 0.4 mg under the tongue every 5 (five) minutes as needed. May repeat x 3 for chest pain      . nystatin (MYCOSTATIN) 100000 UNIT/ML suspension Swish and swallow 500,000 Units 4 (four) times daily.       . pantoprazole (PROTONIX) 40 MG tablet Take 40 mg by mouth daily.        . predniSONE (DELTASONE) 5 MG tablet Take 1 tablet (5 mg total) by mouth daily.      . predniSONE (STERAPRED UNI-PAK) 10 MG tablet Take 1 tablet (10 mg total) by mouth See admin instructions.  21 tablet  0  . tiotropium (SPIRIVA) 18 MCG inhalation capsule Place 18 mcg into inhaler and inhale daily.           Allergies  Allergen Reactions  . Donepezil Diarrhea  . Heparin Itching and Rash  . Hydralazine Other (See Comments)    DO NOT TAKE PER MD  . Hydromorphone Itching  . Morphine Itching  . Plavix (Clopidogrel Bisulfate) Other (See Comments)    GI BLEEDING; "so bad I had to take blood; dr took me off it"  . Promethazine Other (See Comments)    respiratory failure  . Sulfonamide Derivatives Itching  . Zaroxolyn (Metolazone) Diarrhea  . Cefuroxime Other (See Comments)    REACTION: unkown reaction  . Metoprolol Other (See Comments)    unknown  . Ertapenem Itching and Rash    12/31/2011 pt does not recall allergy or severity  :  Family History  Problem Relation Age of Onset  . Lung cancer Brother   . Diabetes Mother   . Arthritis Mother   .  Heart disease Sister   . Stomach cancer Maternal Grandfather   . Heart disease Father   . Heart disease Brother   . ALS Sister   . ALS      nephew/neice  . Colon cancer Neg Hx   :  History   Social History  . Marital Status: Married    Spouse Name: N/A    Number of Children: 5  . Years of Education: N/A   Occupational History  . retired     Building services engineer supply   Social History Main Topics  . Smoking status: Former Smoker -- 2.5 packs/day for 45 years    Types: Cigarettes    Quit date: 05/25/1990  . Smokeless tobacco: Former Neurosurgeon    Types: Chew    Quit date: 06/01/1990  . Alcohol Use: No     Comment: 12/31/2011 "used to drink; last alcohol has been 30 years I imagine"  . Drug Use: No  . Sexually Active: Not Currently   Other Topics Concern  . Not on file   Social History Narrative   Daily caffeine use.  Lives in a house with his wife.  Uses a cane to ambulate and he has a walker, but he does not use it.  He does not cook or pay bills.  He does not drive.    :  REVIEW OF SYSTEM:  The rest of the 14-point review of sytem was negative.   Exam: ECOG 1  General:  Mildly obese man, in no acute distress.  Eyes:  no scleral  icterus.  ENT:  There were no oropharyngeal lesions.  Neck was without thyromegaly.  Lymphatics:  Negative cervical, supraclavicular or axillary adenopathy.  Respiratory: lungs were clear bilaterally without wheezing or crackles.  Cardiovascular:  Regular rate and rhythm, S1/S2, without murmur, rub or gallop.  There was 1+ bilateral pedal edema.  GI:  abdomen was soft, obese, nontender, nondistended, without organomegaly.  Muscoloskeletal:  no spinal tenderness of palpation of vertebral spine.  Skin exam was without echymosis, petichae.  Neuro exam was nonfocal.  Patient was able to get on and off exam table without assistance.  Gait was normal.  Patient was alerted and oriented.  Attention was good.   Language was appropriate.  Mood was normal without depression.   Speech was not pressured.  Thought content was not tangential.    LABS:  Lab Results  Component Value Date   WBC 8.4 04/20/2012   HGB 9.3* 04/20/2012   HCT 29.5* 04/20/2012   PLT 259 04/20/2012   GLUCOSE 244* 04/15/2012   CHOL 149 11/21/2010   TRIG 356* 11/21/2010   HDL 31* 11/21/2010   LDLCALC 47 11/21/2010   ALT 13 04/13/2012   AST 51* 04/13/2012   NA 134* 04/15/2012   K 5.3* 04/15/2012   CL 98 04/15/2012   CREATININE 1.51* 04/15/2012   BUN 25* 04/15/2012   CO2 26 04/15/2012   INR 1.13 03/04/2012   HGBA1C 6.2* 04/01/2012     Blood smear review:   I personally reviewed the patient's peripheral blood smear today.  There was isocytosis.  There was no peripheral blast.  There was no schistocytosis, spherocytosis, target cell, rouleaux formation, tear drop cell.  There was no giant platelets or platelet clumps.     ASSESSMENT AND PLAN:   1.  Diabetes mellitus, type II with retinopathy, nephropathy:  He is on insulin. 2.  Hypertension:  He is on amlodipine, clonidine, lasix, lisinopril.  3.  CAD:  He is on ASA, isosorbide, atorvastatin/Zetia, lisinopril 4.  Hyperlipidemia:  On atorvastatin/Zetia. 5.  Gout:  On allopurinol. 6.  COPD:  On inhalers. 7.  Diastolic congestive heart failure:  On Lasix. 8.  Chronic kidney disease, stage II:  Due to [facility], DM. 9.  AVM:  S/p recent cauterization by GI. 10.Chronic borderline microcytic anemia:  -Potential causes:  Iron deficiency from chronic blood loss from AVM (arteriole-venous malformation) and anemia of chronic kidney disease.   - Work up:  I sent for SPEP to rule out myeloma given also CKD.  Diagnostic bone marrow biopsy at this time before empiric iron supplementation is premature.  -Recommendation:  *  IV iron Feraheme within 1 week to improve iron store.  Continue oral iron as tolerated (SlowFe 325mg  by mouth twice daily; OR NuIron 150mg  by mouth twice daily).  He has not tolerated oral iron well.  He did not remember the brand.   I advised him to take oral iron at first once daily before increasing to BID to minimize GI upset.   *  For anemia of chronic kidney disease; Aranesp injection once a month is a potential option.  However, Aranesp slightly increases the risk of blood clot (stroke, heart attack).  Therefore, I normally wait on Aranesp until patient requires frequent blood transfusion (such as monthly).  11.  Follow up:  CBC check at the Cancer Center monthly.  Return visit in about 6 months.    Thank you for this referral.   The length of time of the face-to-face encounter was 45  minutes. More than 50% of time was spent counseling and coordination of care.

## 2012-04-20 NOTE — Patient Instructions (Addendum)
1.  Diagnosis:  Anemia 2.  Potential causes:  Iron deficiency from chronic blood loss from AVM (arteriole-venous malformation) and anemia of chronic kidney disease.  3.  Recommendation:  *  IV iron Feraheme within 1 week to improve iron store.  Continue oral iron as tolerated (SlowFe 325mg  by mouth twice daily; OR NuIron 150mg  by mouth twice daily).  *  For anemia of chronic kidney disease; Aranesp injection once a month is a potential option.  However, Aranesp slightly increases the risk of blood clot (stroke, heart attack).  Therefore, I normally wait on Aranesp until patient requires frequent blood transfusion (such as monthly).  4.  Follow up:  CBC check at the Cancer Center monthly.  Return visit in about 6 months.

## 2012-04-20 NOTE — Telephone Encounter (Signed)
appts made and printed for pt aom °

## 2012-04-25 LAB — PROTEIN ELECTROPHORESIS, SERUM
Alpha-2-Globulin: 15.1 % — ABNORMAL HIGH (ref 7.1–11.8)
Gamma Globulin: 9.3 % — ABNORMAL LOW (ref 11.1–18.8)
Total Protein, Serum Electrophoresis: 6.1 g/dL (ref 6.0–8.3)

## 2012-04-25 LAB — KAPPA/LAMBDA LIGHT CHAINS
Kappa free light chain: 1.15 mg/dL (ref 0.33–1.94)
Lambda Free Lght Chn: 0.92 mg/dL (ref 0.57–2.63)

## 2012-04-26 ENCOUNTER — Inpatient Hospital Stay: Payer: BC Managed Care – PPO | Admitting: Internal Medicine

## 2012-04-28 ENCOUNTER — Other Ambulatory Visit: Payer: Self-pay | Admitting: *Deleted

## 2012-04-28 ENCOUNTER — Ambulatory Visit (HOSPITAL_BASED_OUTPATIENT_CLINIC_OR_DEPARTMENT_OTHER): Payer: BC Managed Care – PPO

## 2012-04-28 VITALS — BP 143/51 | HR 71 | Temp 98.0°F

## 2012-04-28 DIAGNOSIS — D649 Anemia, unspecified: Secondary | ICD-10-CM

## 2012-04-28 DIAGNOSIS — D500 Iron deficiency anemia secondary to blood loss (chronic): Secondary | ICD-10-CM

## 2012-04-28 MED ORDER — SODIUM CHLORIDE 0.9 % IV SOLN
1020.0000 mg | Freq: Once | INTRAVENOUS | Status: AC
  Administered 2012-04-28: 1020 mg via INTRAVENOUS
  Filled 2012-04-28: qty 34

## 2012-04-28 NOTE — Patient Instructions (Addendum)
[facility] Discharge Instructions    If you develop nausea and vomiting , call the clinic. If it is after clinic hours your family physician or the after hours number for the clinic or go to the Emergency Department.   BELOW ARE SYMPTOMS THAT SHOULD BE REPORTED IMMEDIATELY:  *FEVER GREATER THAN 100.5 F  *CHILLS WITH OR WITHOUT FEVER  NAUSEA AND VOMITING THAT IS NOT CONTROLLED WITH YOUR NAUSEA MEDICATION  *UNUSUAL SHORTNESS OF BREATH  *UNUSUAL BRUISING OR BLEEDING  TENDERNESS IN MOUTH AND THROAT WITH OR WITHOUT PRESENCE OF ULCERS  *URINARY PROBLEMS  *BOWEL PROBLEMS  UNUSUAL RASH Items with * indicate a potential emergency and should be followed up as soon as possible.  . Feel free to call the clinic you have any questions or concerns. The clinic phone number is [phone].   I have been informed and understand all the instructions given to me. I know to contact the clinic, my physician, or go to the Emergency Department if any problems should occur. I do not have any questions at this time, but understand that I may call the clinic during office hours   should I have any questions or need assistance in obtaining follow up care.    __________________________________________  _____________  __________ Signature of Patient or Authorized Representative            Date                   Time    __________________________________________ Nurse's Signature

## 2012-05-02 ENCOUNTER — Telehealth: Payer: Self-pay | Admitting: *Deleted

## 2012-05-02 NOTE — Telephone Encounter (Signed)
Wife left VM asking if pt can have his labs drawn at his PCP?  I called pt back and he asks if labs can be done at Dr. Jeannetta Nap in [place]?  State he can go there tomorrow.  Informed him lab not due until 05/20/12.  I will contact Dr. Jeannetta Nap office to see if they can do CBC w/ diff at their office.   Will let pt know.  He verbalized understanding.

## 2012-05-02 NOTE — Telephone Encounter (Signed)
Spoke w/ Dr. Jeannetta Nap office and they are willing to do CBCs at their office.  Rx signed by Clenton Pare, NP and faxed to Dr. Jeannetta Nap at fax [phone].  Notified pt he can have monthly labs done there and his next visit w/ Dr. Gaylyn Rong is here on 10/05/12.  He verbalized understanding.

## 2012-05-03 ENCOUNTER — Telehealth: Payer: Self-pay | Admitting: Oncology

## 2012-05-03 NOTE — Telephone Encounter (Signed)
Per 12.9.13 pof cancel all lab only appts and keep all 5.14.14 appts...Marland Kitchenpof states that pt is aware

## 2012-05-04 ENCOUNTER — Ambulatory Visit (INDEPENDENT_AMBULATORY_CARE_PROVIDER_SITE_OTHER): Payer: BC Managed Care – PPO | Admitting: Cardiovascular Disease

## 2012-05-04 ENCOUNTER — Encounter: Payer: Self-pay | Admitting: Cardiovascular Disease

## 2012-05-04 VITALS — BP 147/50 | HR 65 | Resp 18 | Ht 68.0 in | Wt 246.0 lb

## 2012-05-04 DIAGNOSIS — I519 Heart disease, unspecified: Secondary | ICD-10-CM

## 2012-05-04 DIAGNOSIS — I2510 Atherosclerotic heart disease of native coronary artery without angina pectoris: Secondary | ICD-10-CM

## 2012-05-04 DIAGNOSIS — I5189 Other ill-defined heart diseases: Secondary | ICD-10-CM

## 2012-05-04 LAB — CBC WITH DIFFERENTIAL/PLATELET
Basophils Absolute: 0 10*3/uL (ref 0.0–0.1)
Eosinophils Absolute: 0.1 10*3/uL (ref 0.0–0.7)
Eosinophils Relative: 1.3 % (ref 0.0–5.0)
HCT: 28.3 % — ABNORMAL LOW (ref 39.0–52.0)
Lymphs Abs: 0.7 10*3/uL (ref 0.7–4.0)
MCHC: 31.7 g/dL (ref 30.0–36.0)
MCV: 88.1 fl (ref 78.0–100.0)
Monocytes Absolute: 0.5 10*3/uL (ref 0.1–1.0)
Neutrophils Relative %: 84.6 % — ABNORMAL HIGH (ref 43.0–77.0)
Platelets: 177 10*3/uL (ref 150.0–400.0)
RDW: 21.2 % — ABNORMAL HIGH (ref 11.5–14.6)
WBC: 8.5 10*3/uL (ref 4.5–10.5)

## 2012-05-04 LAB — COMPREHENSIVE METABOLIC PANEL
AST: 24 U/L (ref 0–37)
Albumin: 3.4 g/dL — ABNORMAL LOW (ref 3.5–5.2)
Alkaline Phosphatase: 61 U/L (ref 39–117)
Glucose, Bld: 185 mg/dL — ABNORMAL HIGH (ref 70–99)
Potassium: 4.2 mEq/L (ref 3.5–5.1)
Sodium: 141 mEq/L (ref 135–145)
Total Bilirubin: 0.6 mg/dL (ref 0.3–1.2)
Total Protein: 6.5 g/dL (ref 6.0–8.3)

## 2012-05-04 NOTE — Patient Instructions (Addendum)
Increase Lasix to 80mg  twice daily.  Take first thing in the morning and again at [facility].  LABS TODAY:  CMP, BNP, and CBC.  Your physician recommends that you return for lab work in: 2 weeks for a BMET.  Your physician recommends that you schedule a follow-up appointment in: 1 months with Dr. Excell Seltzer.

## 2012-05-06 MED ORDER — FUROSEMIDE 80 MG PO TABS: 80.0000 mg | ORAL_TABLET | Freq: Two times a day (BID) | ORAL | Status: DC

## 2012-05-09 ENCOUNTER — Telehealth: Payer: Self-pay | Admitting: Cardiovascular Disease

## 2012-05-09 NOTE — Telephone Encounter (Signed)
Records Received Via Mail From [facility] on patient  For Appt With Dr.Cooper 06/08/11, these Were Given to [facility] 05/09/12/KM

## 2012-05-20 ENCOUNTER — Other Ambulatory Visit: Payer: BC Managed Care – PPO

## 2012-05-22 ENCOUNTER — Encounter: Payer: Self-pay | Admitting: Cardiovascular Disease

## 2012-05-22 NOTE — Progress Notes (Signed)
HPI:  78 year-old presenting for cardiac evaluation. He has a complex cardiac history and has been followed in the [facility] practice for many years, initially by Dr Elsie Lincoln beginning in 1992 and more recently by Dr Allyson Sabal. He wishes to change to our practice.  He has a history of CAD s/p CABG in 1997 and multiple PCI procedures. His most recent cardiac cath was in 2011 and it demonstrated 3 vessel CAD with nonobstructive disease of the dominant RCA, Severe LAD and LCx disease with patency of the LIMA-LAD and SVG-OM branches. He has also undergone iliac stenting and he has extensive peripheral arterial disease. He has a history of contrast-induced nephropathy and has required short-term hemodialysis in the past.  He also has a history of chronic anemia and GI bleeding related to AVM's. This is well-documented in hospital and consultant office notes and all of these details were reviewed.  From a symptomatic perspective, he complains of leg swelling, abdominal swelling, and shortness of breath. He has been hospitalized for diastolic CHF on several occasions, and symptoms feel similar now. He has recently increased his lasix dose and notes edema is slightly improved.  He denies chest pain or pressure. No orthopnea or PND. He complains of bilateral calf pain at short distance, but no rest pain or ulceration. He has Type 2 DM and requires insulin for management. He wears CPAP with supplemental O2. Was a greater than 2ppd smoker and quit in 1992.  Outpatient Encounter Prescriptions as of 05/04/2012  Medication Sig Dispense Refill  . acetaminophen (TYLENOL) 650 MG CR tablet Take 1,300 mg by mouth 2 (two) times daily.       Marland Kitchen albuterol (PROAIR HFA) 108 (90 BASE) MCG/ACT inhaler Inhale 2 puffs into the lungs every 4 (four) hours as needed for wheezing or shortness of breath.  3 Inhaler  3  . albuterol (PROVENTIL) (2.5 MG/3ML) 0.083% nebulizer solution Take 2.5 mg by nebulization every 6 (six) hours as needed. For  shortness of breath      . allopurinol (ZYLOPRIM) 300 MG tablet Take 300 mg by mouth 2 (two) times daily.      Marland Kitchen amLODipine (NORVASC) 10 MG tablet Take 1 tablet (10 mg total) by mouth daily.  30 tablet  5  . aspirin EC 325 MG tablet Take 325 mg by mouth daily.      Marland Kitchen atorvastatin (LIPITOR) 20 MG tablet Take 20 mg by mouth at bedtime.      . cloNIDine HCl (KAPVAY) 0.1 MG TB12 ER tablet Take 1 tablet (0.1 mg total) by mouth 2 (two) times daily.  60 tablet  5  . colchicine 0.6 MG tablet Take 0.6 mg by mouth daily.       Marland Kitchen escitalopram (LEXAPRO) 20 MG tablet Take 20 mg by mouth daily.      Marland Kitchen ezetimibe (ZETIA) 10 MG tablet Take 10 mg by mouth daily.        . folic acid (FOLVITE) 400 MCG tablet Take 400 mcg by mouth daily.        . furosemide (LASIX) 80 MG tablet Take 1 tablet (80 mg total) by mouth 2 (two) times daily.  60 tablet  4  . gabapentin (NEURONTIN) 100 MG tablet Take 100 mg by mouth at bedtime.        . insulin aspart (NOVOLOG FLEXPEN) 100 UNIT/ML injection Inject 10-20 Units into the skin 3 (three) times daily before meals. 10 units with breakfast, 20 units with lunch, and 20 units with supper      .  insulin glargine (LANTUS SOLOSTAR) 100 UNIT/ML injection Inject 30 Units into the skin 2 (two) times daily.      . isosorbide mononitrate (IMDUR) 30 MG 24 hr tablet Take 60 mg by mouth daily.       Marland Kitchen lisinopril (PRINIVIL,ZESTRIL) 5 MG tablet Take 1 tablet (5 mg total) by mouth daily.  30 tablet  11  . Multiple Vitamin (MULTIVITAMIN WITH MINERALS) TABS Take 0.5 tablets by mouth 2 (two) times daily.      . nitroGLYCERIN (NITROSTAT) 0.4 MG SL tablet Place 0.4 mg under the tongue every 5 (five) minutes as needed. May repeat x 3 for chest pain      . nystatin (MYCOSTATIN) 100000 UNIT/ML suspension Swish and swallow 500,000 Units as needed.       . pantoprazole (PROTONIX) 40 MG tablet Take 40 mg by mouth daily.        . predniSONE (DELTASONE) 5 MG tablet Take 1 tablet (5 mg total) by mouth daily.       . [DISCONTINUED] furosemide (LASIX) 40 MG tablet Take 1 tablet (40 mg total) by mouth daily.  30 tablet  2  . tiotropium (SPIRIVA) 18 MCG inhalation capsule Place 18 mcg into inhaler and inhale daily.       . [DISCONTINUED] levofloxacin (LEVAQUIN) 500 MG tablet Take 1 tablet (500 mg total) by mouth daily.  10 tablet  0  . [DISCONTINUED] predniSONE (STERAPRED UNI-PAK) 10 MG tablet Take 1 tablet (10 mg total) by mouth See admin instructions.  21 tablet  0    Donepezil; Heparin; Hydralazine; Hydromorphone; Morphine; Plavix; Promethazine; Sulfonamide derivatives; Zaroxolyn; Cefuroxime; Metoprolol; and Ertapenem  Past Medical History  Diagnosis Date  . Adenomatous colon polyp   . Diverticulosis   . GERD (gastroesophageal reflux disease)   . Hiatal hernia   . Alzheimer disease   . Gout     "hands; backbone; elbows; shoulders"  . Asthma with bronchitis   . COPD (chronic obstructive pulmonary disease)   . Chronic hypoxemic respiratory failure   . OSA (obstructive sleep apnea)     CPAP  . CHF (congestive heart failure)     grade 2 diastolic dysfunction, EF 65-70% on ECHO 12/2011  . Blood transfusion     "I've had about 6 pints" (12/31/2011)  . Pneumonia     intermittent  . Anemia   . GI bleed     GI Dr. Marina Goodell,   . Hypertension   . Peripheral vascular disease     PAD  . Dysrhythmia     "skips"  . Anginal pain   . Myocardial infarction     "they said I had 2 light ones"  . Shortness of breath 12/31/2011    "all the time"  . Type II diabetes mellitus     with complications:  retinopathy, nephrophathy.   . Arthritis     "plenty"  . Chronic lower back pain   . Renal insufficiency     "went on dialysis probably 3 times" Last 04/2011  . Skin cancer     "forehead; arms"  . Depression   . Diverticulosis   . Colon polyps     adenomatous and hyperplastic  . GERD (gastroesophageal reflux disease)   . Hiatal hernia     Past Surgical History  Procedure Date  . Hand surgery 1990s     "chain saw accident; had to reattach fingers on left hand"  . Esophagogastroduodenoscopy 05/05/2011    Procedure: ESOPHAGOGASTRODUODENOSCOPY (EGD);  Surgeon: Louis Meckel,  MD;  Location: [facility] ENDOSCOPY;  Service: Endoscopy;  Laterality: N/A;  . Flexible sigmoidoscopy 05/05/2011    Procedure: FLEXIBLE SIGMOIDOSCOPY;  Surgeon: Louis Meckel, MD;  Location: [facility] ENDOSCOPY;  Service: Endoscopy;  Laterality: N/A;  . Laparoscopic cholecystectomy ~ 2001  . Excisional hemorrhoidectomy 1950's  . Cataract extraction w/ intraocular lens  implant, bilateral   . Coronary artery bypass graft 1997    CABG X2 vessels  . Iliac artery stent 11/2011    left/H&P  . Renal artery stent 2010    left/H&P  . Iliac artery stent 11/2011    right  . Coronary angioplasty with stent placement 12/31/2011    "I have 15 stents; heart, kidney, aorta, legs"  . Colonoscopy w/ biopsies and polypectomy     "have had probably 5 cut out" (12/31/2011)  . Enteroscopy 03/04/2012    Procedure: ENTEROSCOPY;  Surgeon: Louis Meckel, MD;  Location: [facility] ENDOSCOPY;  Service: Endoscopy;  Laterality: N/A;  jessica/leone  . Coronary artery bypass graft 1997    History   Social History  . Marital Status: Married    Spouse Name: N/A    Number of Children: 5  . Years of Education: N/A   Occupational History  . retired     Building services engineer supply   Social History Main Topics  . Smoking status: Former Smoker -- 2.5 packs/day for 45 years    Types: Cigarettes    Quit date: 05/25/1990  . Smokeless tobacco: Former Neurosurgeon    Types: Chew    Quit date: 06/01/1990  . Alcohol Use: No     Comment: 12/31/2011 "used to drink; last alcohol has been 30 years I imagine"  . Drug Use: No  . Sexually Active: Not Currently   Other Topics Concern  . Not on file   Social History Narrative   Daily caffeine use.  Lives in a house with his wife.  Uses a cane to ambulate and he has a walker, but he does not use it.  He does not cook or pay bills.  He does not  drive.      Family History  Problem Relation Age of Onset  . Lung cancer Brother   . Diabetes Mother   . Arthritis Mother   . Heart disease Sister   . Stomach cancer Maternal Grandfather   . Heart disease Father   . Heart disease Brother   . ALS Sister   . ALS      nephew/neice  . Colon cancer Neg Hx     ROS: General: no fevers/chills/night sweats. Positive for generalized fatigue. Eyes: no blurry vision, diplopia, or amaurosis ENT: no sore throat or hearing loss Resp: no wheezing, or hemoptysis. Positive for cough. CV: no palpitations GI: no abdominal pain, nausea, vomiting, diarrhea, or constipation. Positive bloating. GU: no dysuria, frequency, or hematuria Skin: no rash Neuro: no headache, numbness, tingling, or weakness of extremities Musculoskeletal: no joint pain or swelling Heme: no bleeding, DVT, or easy bruising Endo: no polydipsia or polyuria  BP 147/50  Pulse 65  Resp 18  Ht 5\' 8"  (1.727 m)  Wt 111.585 kg (246 lb)  BMI 37.40 kg/m2  PHYSICAL EXAM: Pt is alert and oriented, WD, WN, obese, elderly male in no distress. HEENT: normal Neck: JVP moderately elevated. Carotid upstrokes normal with bilateral bruits. No thyromegaly. Lungs: equal expansion, clear bilaterally CV: Apex is discrete and nondisplaced, RRR without murmur or gallop Abd: soft, NT, +BS, no bruit, no hepatosplenomegaly Back: no CVA tenderness Ext:  2+ pretibial edema bilaterally        Femoral pulses palpable bilaterally        DP/PT pulses nonpalpable bilaterally, but obtainable by doppler with stronger signals on the left Skin: warm and dry without rash Neuro: CNII-XII intact             Strength intact = bilaterally  EKG:  NSR, nonspecific ST-T abnormality  2D ECHO:  Study Conclusions  - Left ventricle: The cavity size was normal. Wall thickness was normal. Systolic function was vigorous. The estimated ejection fraction was in the range of 65% to 70%. Wall motion was normal;  there were no regional wall motion abnormalities. Features are consistent with a pseudonormal left ventricular filling pattern, with concomitant abnormal relaxation and increased filling pressure (grade 2 diastolic dysfunction). Doppler parameters are consistent with elevated mean left atrial filling pressure. - Ventricular septum: Septal motion showed paradox. - Mitral valve: Calcified annulus. Mildly thickened leaflets . - Left atrium: The atrium was mildly dilated.  ASSESSMENT AND PLAN: 1. Acute on Chronic diastolic CHF. This appears to be major issue at present. He is clinically volume-overloaded. I have asked him to increase furosemide to 80 mg BID and we will check baseline labs today. Otherwise will continue current medical program. It appears he's had problems with metolazone in the past so will avoid that for now. He understands to call if symptoms progress and he will require hospital admission if that occurs. He was counseled regarding sodium restriction.  2. CAD s/p CABG. No active symptoms of angina. Continue current meds. Details of last cardiac catheterization reviewed.  3. CKD - BMET today and increase diuretics. Suspect this has been a major issue complicating his CHF management.   4. Obesity  5. Type 2 DM - management per PCP  6. PAD - stable intermittent claudication. Prior iliac stenting. Continue ASA. Avoid repeat angiography unless progressive symptoms of rest pain or ulceration.  For follow-up I will see him back in 4 weeks. I spent over 60 minutes with this complex patient today, greater than 50% of that time in face-to-face counseling.  Tonny Bollman 05/22/2012 11:01 PM

## 2012-05-24 ENCOUNTER — Encounter: Payer: Self-pay | Admitting: *Deleted

## 2012-05-24 NOTE — Progress Notes (Signed)
CBC received from Dr. Jeannetta Nap office and forwarded to Dr. Gaylyn Rong for review.  Hgb 11.2 on 05/20/12.

## 2012-06-17 ENCOUNTER — Other Ambulatory Visit: Payer: BC Managed Care – PPO

## 2012-06-23 ENCOUNTER — Other Ambulatory Visit: Payer: Self-pay | Admitting: Oncology

## 2012-06-23 ENCOUNTER — Encounter: Payer: Self-pay | Admitting: *Deleted

## 2012-06-23 NOTE — Progress Notes (Signed)
CBC (no platelets) received from [facility].  Forwarded to Dr. Gaylyn Rong for review.

## 2012-06-24 ENCOUNTER — Ambulatory Visit (INDEPENDENT_AMBULATORY_CARE_PROVIDER_SITE_OTHER): Payer: BC Managed Care – PPO | Admitting: Cardiovascular Disease

## 2012-06-24 ENCOUNTER — Encounter: Payer: Self-pay | Admitting: Cardiovascular Disease

## 2012-06-24 VITALS — BP 155/68 | HR 70 | Ht 68.0 in | Wt 246.0 lb

## 2012-06-24 DIAGNOSIS — I2510 Atherosclerotic heart disease of native coronary artery without angina pectoris: Secondary | ICD-10-CM

## 2012-06-24 DIAGNOSIS — I5033 Acute on chronic diastolic (congestive) heart failure: Secondary | ICD-10-CM

## 2012-06-24 MED ORDER — METOLAZONE 2.5 MG PO TABS: ORAL_TABLET | ORAL | Status: DC

## 2012-06-24 NOTE — Patient Instructions (Addendum)
I spoke with Tammy in the lab at Dr Hennie Duos office and she will run a BMP from the pt's labs that were drawn on 06/20/12.  She will fax a copy of results to our office.   Your physician recommends that you return for lab work in: 2 WEEKS (BMP)--order given to the pt to have his lab drawn at Dr Hennie Duos office  Your physician has recommended you make the following change in your medication: STOP Lisinopril, START Metolazone 2.5mg  one by mouth 30 minutes prior to morning Furosemide dose on Monday and Friday  Your physician recommends that you schedule a follow-up appointment in: 3 MONTHS with Dr Excell Seltzer

## 2012-06-24 NOTE — Progress Notes (Signed)
HPI:  79 year old gentleman presenting for followup evaluation. I first saw him last month after he has been followed for many years in the [place] practice. The patient has an extensive cardiac history with coronary artery disease dating back to his bypass surgery in 1997. He's undergone multiple PCI procedures. He has a history of contrast-induced nephropathy and has required hemodialysis in the past. He also has chronic anemia related to AVMs, chronic kidney disease, and diastolic heart failure requiring frequent hospitalizations. His wife takes very close care of him, but he has not been very compliant patient.  At the time of his last visit, his furosemide dose was increased to 80 mg twice daily. He is given metolazone on occasion when his weight is increased. He takes his lisinopril intermittently and it is not given when his blood pressure is in the low-normal range. He continues to be limited from leg pain and shortness of breath. He had an episode of discomfort this morning with pain starting on the left side of his head radiating down to the left chest and shoulder. This is now resolved. He has no other acute complaints today.  Outpatient Encounter Prescriptions as of 06/24/2012  Medication Sig Dispense Refill  . acetaminophen (TYLENOL) 650 MG CR tablet Take 1,300 mg by mouth 2 (two) times daily.       Marland Kitchen albuterol (PROAIR HFA) 108 (90 BASE) MCG/ACT inhaler Inhale 2 puffs into the lungs every 4 (four) hours as needed for wheezing or shortness of breath.  3 Inhaler  3  . albuterol (PROVENTIL) (2.5 MG/3ML) 0.083% nebulizer solution Take 2.5 mg by nebulization every 6 (six) hours as needed. For shortness of breath      . allopurinol (ZYLOPRIM) 300 MG tablet Take 300 mg by mouth 2 (two) times daily.      Marland Kitchen amLODipine (NORVASC) 10 MG tablet Take 1 tablet (10 mg total) by mouth daily.  30 tablet  5  . aspirin EC 325 MG tablet Take 325 mg by mouth daily.      Marland Kitchen atorvastatin (LIPITOR) 20 MG  tablet Take 20 mg by mouth at bedtime.      . cloNIDine HCl (KAPVAY) 0.1 MG TB12 ER tablet Take 1 tablet (0.1 mg total) by mouth 2 (two) times daily.  60 tablet  5  . colchicine 0.6 MG tablet Take 0.6 mg by mouth daily.       Marland Kitchen escitalopram (LEXAPRO) 20 MG tablet Take 20 mg by mouth daily.      Marland Kitchen ezetimibe (ZETIA) 10 MG tablet Take 10 mg by mouth daily.        . folic acid (FOLVITE) 400 MCG tablet Take 400 mcg by mouth daily.        . furosemide (LASIX) 80 MG tablet Take 1 tablet (80 mg total) by mouth 2 (two) times daily.  60 tablet  4  . gabapentin (NEURONTIN) 100 MG tablet Take 100 mg by mouth at bedtime.        . insulin aspart (NOVOLOG FLEXPEN) 100 UNIT/ML injection Inject 10-20 Units into the skin 3 (three) times daily before meals. 10 units with breakfast, 20 units with lunch, and 20 units with supper      . insulin glargine (LANTUS SOLOSTAR) 100 UNIT/ML injection Inject 30 Units into the skin 2 (two) times daily.      . isosorbide mononitrate (IMDUR) 30 MG 24 hr tablet Take 60 mg by mouth daily.       Marland Kitchen lisinopril (PRINIVIL,ZESTRIL) 5  MG tablet Take 1 tablet (5 mg total) by mouth daily.  30 tablet  11  . Multiple Vitamin (MULTIVITAMIN WITH MINERALS) TABS Take 0.5 tablets by mouth 2 (two) times daily.      . nitroGLYCERIN (NITROSTAT) 0.4 MG SL tablet Place 0.4 mg under the tongue every 5 (five) minutes as needed. May repeat x 3 for chest pain      . nystatin (MYCOSTATIN) 100000 UNIT/ML suspension Swish and swallow 500,000 Units as needed.       . pantoprazole (PROTONIX) 40 MG tablet Take 40 mg by mouth daily.        . predniSONE (DELTASONE) 5 MG tablet Take 1 tablet (5 mg total) by mouth daily.      Marland Kitchen tiotropium (SPIRIVA) 18 MCG inhalation capsule Place 18 mcg into inhaler and inhale daily.         Allergies  Allergen Reactions  . Donepezil Diarrhea  . Heparin Itching and Rash  . Hydralazine Other (See Comments)    DO NOT TAKE PER MD  . Hydromorphone Itching  . Morphine Itching  .  Plavix (Clopidogrel Bisulfate) Other (See Comments)    GI BLEEDING; "so bad I had to take blood; dr took me off it"  . Promethazine Other (See Comments)    respiratory failure  . Sulfonamide Derivatives Itching  . Zaroxolyn (Metolazone) Diarrhea  . Cefuroxime Other (See Comments)    REACTION: unkown reaction  . Metoprolol Other (See Comments)    unknown  . Ertapenem Itching and Rash    12/31/2011 pt does not recall allergy or severity    Past Medical History  Diagnosis Date  . Adenomatous colon polyp   . Diverticulosis   . GERD (gastroesophageal reflux disease)   . Hiatal hernia   . Alzheimer disease   . Gout     "hands; backbone; elbows; shoulders"  . Asthma with bronchitis   . COPD (chronic obstructive pulmonary disease)   . Chronic hypoxemic respiratory failure   . OSA (obstructive sleep apnea)     CPAP  . CHF (congestive heart failure)     grade 2 diastolic dysfunction, EF 65-70% on ECHO 12/2011  . Blood transfusion     "I've had about 6 pints" (12/31/2011)  . Pneumonia     intermittent  . Anemia   . GI bleed     GI Dr. Marina Goodell,   . Hypertension   . Peripheral vascular disease     PAD  . Dysrhythmia     "skips"  . Anginal pain   . Myocardial infarction     "they said I had 2 light ones"  . Shortness of breath 12/31/2011    "all the time"  . Type II diabetes mellitus     with complications:  retinopathy, nephrophathy.   . Arthritis     "plenty"  . Chronic lower back pain   . Renal insufficiency     "went on dialysis probably 3 times" Last 04/2011  . Skin cancer     "forehead; arms"  . Depression   . Diverticulosis   . Colon polyps     adenomatous and hyperplastic  . GERD (gastroesophageal reflux disease)   . Hiatal hernia     ROS: Negative except as per HPI  BP 155/68  Pulse 70  Ht 5\' 8"  (1.727 m)  Wt 111.585 kg (246 lb)  BMI 37.40 kg/m2  PHYSICAL EXAM: Pt is alert and oriented, pleasant obese male in NAD HEENT: normal Neck: JVP - mildly  elevated Lungs: CTA bilaterally CV: RRR without murmur or gallop, distant heart sounds Abd: soft, NT, Positive BS, obese Ext: 1+ pretibial edema bilaterally Skin: warm/dry no rash  EKG:  Normal sinus rhythm with nonspecific ST abnormality.  2-D echo 01/01/2012: Study Conclusions - Left ventricle: The cavity size was normal. Wall thickness was normal. Systolic function was vigorous. The estimated ejection fraction was in the range of 65% to 70%. Wall motion was normal; there were no regional wall motion abnormalities. Features are consistent with a pseudonormal left ventricular filling pattern, with concomitant abnormal relaxation and increased filling pressure (grade 2 diastolic dysfunction). Doppler parameters are consistent with elevated mean left atrial filling pressure. - Ventricular septum: Septal motion showed paradox. - Mitral valve: Calcified annulus. Mildly thickened leaflets . - Left atrium: The atrium was mildly dilated.  ASSESSMENT AND PLAN: 1. Acute on chronic diastolic heart failure. This is probably related to multiple factors include ischemic heart disease, morbid obesity, dietary noncompliance, and diastolic dysfunction. I have recommended that he continue on furosemide 80 mg twice daily. I have advised him to begin taking metolazone 2.5 mg on Mondays and Fridays on a regular basis. I think he should stop lisinopril considering his normal LV function and tenuous renal situation. Fluid balance is difficult and I think he is going be at higher risk of acute kidney injury if he continues the ACE inhibitor. We will check a metabolic panel now and repeat it again in a few weeks to make sure that the twice weekly metolazone is not exacerbating his renal dysfunction.  2. Coronary artery disease, status post CABG and multiple PCI procedures. Episode of somewhat atypical pain this morning. His EKG does not show any acute changes. We'll continue his current medications.  3.  Peripheral arterial disease. Continues to have leg pain but no change in symptoms. Continue medical management.  4. Hypertension on multidrug therapy. Blood pressure elevated today but he does close monitoring at home and his blood pressure has been well controlled.  For followup, I will see him back in 3 months. I discussed referral to the advanced CHF clinic but they were not interested.  Tonny Bollman 06/24/2012 10:30 AM

## 2012-06-28 ENCOUNTER — Other Ambulatory Visit: Payer: Self-pay | Admitting: Oncology

## 2012-06-29 ENCOUNTER — Other Ambulatory Visit: Payer: Self-pay | Admitting: *Deleted

## 2012-06-29 DIAGNOSIS — I2510 Atherosclerotic heart disease of native coronary artery without angina pectoris: Secondary | ICD-10-CM

## 2012-06-29 DIAGNOSIS — I5189 Other ill-defined heart diseases: Secondary | ICD-10-CM

## 2012-06-29 MED ORDER — FUROSEMIDE 80 MG PO TABS: 80.0000 mg | ORAL_TABLET | Freq: Two times a day (BID) | ORAL | Status: AC

## 2012-07-06 ENCOUNTER — Encounter: Payer: Self-pay | Admitting: Cardiovascular Disease

## 2012-07-11 ENCOUNTER — Encounter: Payer: Self-pay | Admitting: Cardiovascular Disease

## 2012-07-11 ENCOUNTER — Ambulatory Visit (INDEPENDENT_AMBULATORY_CARE_PROVIDER_SITE_OTHER): Payer: BC Managed Care – PPO | Admitting: Internal Medicine

## 2012-07-11 ENCOUNTER — Ambulatory Visit (INDEPENDENT_AMBULATORY_CARE_PROVIDER_SITE_OTHER)
Admission: RE | Admit: 2012-07-11 | Discharge: 2012-07-11 | Disposition: A | Discharge status: Home / Self Care | Payer: BC Managed Care – PPO | Source: Ambulatory Visit | Attending: Internal Medicine | Admitting: Internal Medicine

## 2012-07-11 ENCOUNTER — Encounter: Payer: Self-pay | Admitting: Internal Medicine

## 2012-07-11 VITALS — BP 126/78 | HR 63 | Ht 69.0 in | Wt 250.8 lb

## 2012-07-11 DIAGNOSIS — J449 Chronic obstructive pulmonary disease, unspecified: Secondary | ICD-10-CM

## 2012-07-11 DIAGNOSIS — G4733 Obstructive sleep apnea (adult) (pediatric): Secondary | ICD-10-CM

## 2012-07-11 DIAGNOSIS — IMO0001 Reserved for inherently not codable concepts without codable children: Secondary | ICD-10-CM

## 2012-07-11 NOTE — Patient Instructions (Addendum)
Order- CXR dx COPD with bronchitis  We can continue CPAP 13/ [facility]  Please call as needed

## 2012-07-11 NOTE — Progress Notes (Signed)
Subjective:    Patient ID: [name], male    DOB: 03-14-1934, 79 y.o.   MRN: [id number]  HPI 10/06/10- 31 yoM former smoker followed for sleep apnea, bronchitis, complicated by CAD with hx acute and chronic respiratory failure, diverticular bleed with anemia. Gout. Wife here. Last here July 07, 2010 . A CT had suggested MAIC with bronchitis. He had mentioned palpitations last time, but no recent concern.  Gout has flared. On maintenance prednisone 5 mg/day for gout. Occasionally some mild chest congestion but Dulera 200 has controlled him. He would like to have a rescue inhaler.  He continues to use CPAP 13/ [employer] all night every night, with O2 for sleep at 3L/M.   04/09/11- 10/06/10- 77 yoM former smoker followed for sleep apnea, bronchitis, complicated by CAD with hx acute and chronic respiratory failure, diverticular bleed with anemia. Gout. Wife here. They describe increased cough, wheeze, shortness of breath for several months with little change from day to day or with weather. He has portable oxygen which he doesn't use, just adding oxygen to his CPAP at night. Cough productive of some light yellow sputum with no blood no chest pain. He did have a cold a few weeks ago at his primary physician and given antibiotic. Using his rescue inhaler 2 or 3 times per week. Nebulizer has been a big help, used at least once daily. He is taking prednisone 5 mg daily for gout. He like to Spiriva but it cost too much. Continues CPAP 13+ oxygen with good compliance and control. Chest x-ray: 11/20/2010-CE, hyperinflation, no active process. A ventilation perfusion lung scan on 11/20/2010 showed air trapping with no PE.  10/08/11-  77 yoM former smoker followed for sleep apnea, bronchitis, complicated by CAD with hx acute and chronic respiratory failure, diverticular bleed with anemia. Gout. ? Bronchitis flare up; increased cough-slight productive yellow in color, SOB and wheezing. Chronic cough with  little phlegm with scant yellow no blood. Still on Spiriva but complains it is expensive. Remains on maintenance prednisone 5 mg daily for gout diabetes prevents increasing maintenance prednisone and medication cost is a problem for alternatives. Uses CPAP every night with O2 approx 8-9 hours  01/07/12- 77 yoM former smoker followed for sleep apnea, bronchitis, complicated by CAD/ CABG/ PAD/ Gr 2 diastolic dysf/ CHF with hx acute and chronic respiratory failure, diverticular bleed with anemia. Gout. Chronic renal insufficiency.  Wife here.  Patient states was in the hospital 8/8-8/11 for sob/ CHF. Iliac re-stenting in July.  Patient states doing better now. c/o wheezing and cough with yellow mucus.  Denies sob, chest pain ,chest tightness. Continues prednisone 5 mg daily. CXR 01/03/12 IMPRESSION:  Stable cardiomegaly, mild pulmonary vascular congestion and chronic  interstitial lung disease. No acute abnormality.  Original Report Authenticated By: Darrol Angel, M.D.   07/11/12- 64 yoM former smoker followed for sleep apnea, bronchitis, complicated by CAD/ CABG/ PAD/ Gr 2 diastolic dysf/ CHF with hx acute and chronic respiratory failure, diverticular bleed with anemia. Gout. Chronic renal insufficiency.  Wife here FOLLOWS FOR: wears CPAP every night for about 7-8 hours; pressure working well for patient.(has O2 with CPAP); states he is having wheezing as well. Frequent cough with occ wheeze, scant thick white. No acute events. Fluid control has been better, with no ankle edema now.  Continues CPAP 13/ [employer] all night every night.  CT chest 04/14/12 IMPRESSION:  Increased interstitial thickening along with the small effusions is  most suggestive of pulmonary edema superimposed on  chronic  interstitial scarring. The presence of mildly enlarged mediastinal  lymph nodes, new from prior study is nonspecific. These may be  inflammatory or reactive to infection and thus the possibility of a  more  diffuse interstitial infectious infiltrate should be  considered. Pulmonary edema is favored.  Original Report Authenticated By: Amie Portland, M.D.   Review of Systems-see HPI Constitutional:   No-   weight loss, night sweats, fevers, chills, fatigue, lassitude. HEENT:   No-  headaches, difficulty swallowing, tooth/dental problems, sore throat,       No-  sneezing, itching, ear ache, nasal congestion, post nasal drip,  CV:  No-   chest pain, orthopnea, PND, swelling in lower extremities, anasarca,  dizziness, palpitations Resp: +   shortness of breath with exertion or at rest.              +   productive cough,  No non-productive cough,  No- coughing up of blood.            +  change in color of mucus.  No- wheezing.   Skin: No-   rash or lesions. GI:  No-   heartburn, indigestion, abdominal pain, nausea, vomiting, e GU:  MS:  No-   joint pain or swelling.   Neuro-     nothing unusual Psych:  No- change in mood or affect. No depression or anxiety.  No memory loss.   Objective:   Physical Exam  General- Alert, Oriented, Affect-appropriate, Distress- none acute; obese Skin- rash-none, lesions- none, excoriation- none Lymphadenopathy- none Head- atraumatic            Eyes- Gross vision intact, PERRLA, conjunctivae clear secretions            Ears- Hearing, canals-normal            Nose- Clear, no-Septal dev, mucus, polyps, erosion, perforation             Throat- Mallampati II , mucosa clear , drainage- none, tonsils- atrophic Neck- flexible , trachea midline, no stridor , thyroid nl, carotid no bruit Chest - symmetrical excursion , unlabored           Heart/CV- RRR , no murmur , no gallop  , no rub, nl s1 s2                           - JVD+-none , edema- none, stasis changes- none, varices- none           Lung-  +diminished, no rales, unlabored, wheeze- none, cough- none , dullness-none, rub- none           Chest wall-  Abd-  Br/ Gen/ Rectal- Not done, not indicated Extrem-  cyanosis- none, clubbing, none, atrophy- none, strength- nl Neuro- grossly intact to observation

## 2012-07-15 ENCOUNTER — Other Ambulatory Visit: Payer: Self-pay

## 2012-07-15 ENCOUNTER — Other Ambulatory Visit: Payer: BC Managed Care – PPO

## 2012-07-15 DIAGNOSIS — I2510 Atherosclerotic heart disease of native coronary artery without angina pectoris: Secondary | ICD-10-CM

## 2012-07-15 DIAGNOSIS — I5033 Acute on chronic diastolic (congestive) heart failure: Secondary | ICD-10-CM

## 2012-07-28 ENCOUNTER — Encounter: Payer: Self-pay | Admitting: *Deleted

## 2012-07-28 NOTE — Progress Notes (Signed)
Received note from Dr. Andrey Campanile in [place].  Pt's Hgb on 3/04 was 13.1.  Serum Iron level 58.  Forwarded to Dr. Gaylyn Rong for review.

## 2012-08-01 ENCOUNTER — Other Ambulatory Visit: Payer: Self-pay | Admitting: Oncology

## 2012-08-12 ENCOUNTER — Other Ambulatory Visit: Payer: BC Managed Care – PPO

## 2012-08-16 ENCOUNTER — Encounter: Payer: Self-pay | Admitting: Cardiovascular Disease

## 2012-08-22 ENCOUNTER — Encounter: Payer: Self-pay | Admitting: *Deleted

## 2012-08-22 NOTE — Progress Notes (Signed)
Labs, (bmet and iron) received from [facility].  Serum iron level 76.  No CBC was sent.  Called to [facility] and last CBC they have is from January 2014 which they faxed over.  Hgb was 12.9.   Forwarded to Dr. Gaylyn Rong for review.

## 2012-08-24 ENCOUNTER — Other Ambulatory Visit: Payer: Self-pay | Admitting: Oncology

## 2012-09-09 ENCOUNTER — Other Ambulatory Visit: Payer: BC Managed Care – PPO

## 2012-09-20 ENCOUNTER — Encounter: Payer: Self-pay | Admitting: Cardiovascular Disease

## 2012-09-20 ENCOUNTER — Ambulatory Visit (INDEPENDENT_AMBULATORY_CARE_PROVIDER_SITE_OTHER): Payer: BC Managed Care – PPO | Admitting: Cardiovascular Disease

## 2012-09-20 ENCOUNTER — Inpatient Hospital Stay (HOSPITAL_COMMUNITY)
Admission: AD | Admit: 2012-09-20 | Discharge: 2012-09-21 | DRG: 543 | Disposition: A | Discharge status: Home / Self Care | Payer: BC Managed Care – PPO | Source: Ambulatory Visit | Attending: Cardiovascular Disease | Admitting: Cardiovascular Disease

## 2012-09-20 ENCOUNTER — Inpatient Hospital Stay (HOSPITAL_COMMUNITY): Payer: BC Managed Care – PPO

## 2012-09-20 VITALS — BP 122/68 | HR 67 | Ht 68.5 in | Wt 243.8 lb

## 2012-09-20 DIAGNOSIS — I2510 Atherosclerotic heart disease of native coronary artery without angina pectoris: Secondary | ICD-10-CM | POA: Diagnosis present

## 2012-09-20 DIAGNOSIS — E119 Type 2 diabetes mellitus without complications: Secondary | ICD-10-CM | POA: Diagnosis present

## 2012-09-20 DIAGNOSIS — M109 Gout, unspecified: Secondary | ICD-10-CM | POA: Diagnosis present

## 2012-09-20 DIAGNOSIS — I200 Unstable angina: Principal | ICD-10-CM | POA: Diagnosis present

## 2012-09-20 DIAGNOSIS — I739 Peripheral vascular disease, unspecified: Secondary | ICD-10-CM | POA: Diagnosis present

## 2012-09-20 DIAGNOSIS — Q279 Congenital malformation of peripheral vascular system, unspecified: Secondary | ICD-10-CM

## 2012-09-20 DIAGNOSIS — G4733 Obstructive sleep apnea (adult) (pediatric): Secondary | ICD-10-CM | POA: Diagnosis present

## 2012-09-20 DIAGNOSIS — Z794 Long term (current) use of insulin: Secondary | ICD-10-CM

## 2012-09-20 DIAGNOSIS — I4891 Unspecified atrial fibrillation: Secondary | ICD-10-CM

## 2012-09-20 DIAGNOSIS — I10 Essential (primary) hypertension: Secondary | ICD-10-CM | POA: Diagnosis present

## 2012-09-20 DIAGNOSIS — E11319 Type 2 diabetes mellitus with unspecified diabetic retinopathy without macular edema: Secondary | ICD-10-CM | POA: Diagnosis present

## 2012-09-20 DIAGNOSIS — M545 Low back pain, unspecified: Secondary | ICD-10-CM | POA: Diagnosis present

## 2012-09-20 DIAGNOSIS — N058 Unspecified nephritic syndrome with other morphologic changes: Secondary | ICD-10-CM | POA: Diagnosis present

## 2012-09-20 DIAGNOSIS — I252 Old myocardial infarction: Secondary | ICD-10-CM

## 2012-09-20 DIAGNOSIS — E1139 Type 2 diabetes mellitus with other diabetic ophthalmic complication: Secondary | ICD-10-CM | POA: Diagnosis present

## 2012-09-20 DIAGNOSIS — I5032 Chronic diastolic (congestive) heart failure: Secondary | ICD-10-CM | POA: Diagnosis present

## 2012-09-20 DIAGNOSIS — I519 Heart disease, unspecified: Secondary | ICD-10-CM

## 2012-09-20 DIAGNOSIS — E1129 Type 2 diabetes mellitus with other diabetic kidney complication: Secondary | ICD-10-CM | POA: Diagnosis present

## 2012-09-20 DIAGNOSIS — I509 Heart failure, unspecified: Secondary | ICD-10-CM | POA: Diagnosis present

## 2012-09-20 DIAGNOSIS — J4489 Other specified chronic obstructive pulmonary disease: Secondary | ICD-10-CM | POA: Diagnosis present

## 2012-09-20 DIAGNOSIS — J449 Chronic obstructive pulmonary disease, unspecified: Secondary | ICD-10-CM | POA: Diagnosis present

## 2012-09-20 DIAGNOSIS — G8929 Other chronic pain: Secondary | ICD-10-CM | POA: Diagnosis present

## 2012-09-20 DIAGNOSIS — D638 Anemia in other chronic diseases classified elsewhere: Secondary | ICD-10-CM | POA: Diagnosis present

## 2012-09-20 DIAGNOSIS — Z7901 Long term (current) use of anticoagulants: Secondary | ICD-10-CM

## 2012-09-20 DIAGNOSIS — G309 Alzheimer's disease, unspecified: Secondary | ICD-10-CM | POA: Diagnosis present

## 2012-09-20 DIAGNOSIS — E785 Hyperlipidemia, unspecified: Secondary | ICD-10-CM | POA: Diagnosis present

## 2012-09-20 DIAGNOSIS — I480 Paroxysmal atrial fibrillation: Secondary | ICD-10-CM | POA: Diagnosis present

## 2012-09-20 DIAGNOSIS — F0280 Dementia in other diseases classified elsewhere without behavioral disturbance: Secondary | ICD-10-CM | POA: Diagnosis present

## 2012-09-20 DIAGNOSIS — Z87891 Personal history of nicotine dependence: Secondary | ICD-10-CM

## 2012-09-20 LAB — COMPREHENSIVE METABOLIC PANEL
ALT: 35 U/L (ref 0–53)
AST: 39 U/L — ABNORMAL HIGH (ref 0–37)
CO2: 31 mEq/L (ref 19–32)
Chloride: 92 mEq/L — ABNORMAL LOW (ref 96–112)
GFR calc non Af Amer: 25 mL/min — ABNORMAL LOW (ref >90)
Potassium: 4.1 mEq/L (ref 3.5–5.1)
Sodium: 135 mEq/L (ref 135–145)
Total Bilirubin: 0.5 mg/dL (ref 0.3–1.2)

## 2012-09-20 LAB — CBC WITH DIFFERENTIAL/PLATELET
Lymphocytes Relative: 17 % (ref 12–46)
Lymphs Abs: 1.9 10*3/uL (ref 0.7–4.0)
MCV: 89.8 fL (ref 78.0–100.0)
Neutrophils Relative %: 68 % (ref 43–77)
Platelets: 204 10*3/uL (ref 150–400)
RBC: 4.11 MIL/uL — ABNORMAL LOW (ref 4.22–5.81)
WBC: 11.1 10*3/uL — ABNORMAL HIGH (ref 4.0–10.5)

## 2012-09-20 LAB — GLUCOSE, CAPILLARY

## 2012-09-20 LAB — PROTIME-INR
INR: 0.98 (ref 0.00–1.49)
Prothrombin Time: 12.9 seconds (ref 11.6–15.2)

## 2012-09-20 LAB — TSH: TSH: 4.676 u[IU]/mL — ABNORMAL HIGH (ref 0.350–4.500)

## 2012-09-20 MED ORDER — HEPARIN (PORCINE) IN NACL 100-0.45 UNIT/ML-% IJ SOLN
1400.0000 [IU]/h | INTRAMUSCULAR | Status: DC
  Administered 2012-09-20: 1400 [IU]/h via INTRAVENOUS
  Filled 2012-09-20 (×2): qty 250

## 2012-09-20 MED ORDER — NITROGLYCERIN 0.4 MG SL SUBL
0.4000 mg | SUBLINGUAL_TABLET | SUBLINGUAL | Status: DC | PRN
  Administered 2012-09-20: 0.4 mg via SUBLINGUAL
  Filled 2012-09-20: qty 25

## 2012-09-20 MED ORDER — FUROSEMIDE 80 MG PO TABS
80.0000 mg | ORAL_TABLET | Freq: Two times a day (BID) | ORAL | Status: DC
Start: 2012-09-20 — End: 2012-09-21
  Administered 2012-09-20 – 2012-09-21 (×2): 80 mg via ORAL
  Filled 2012-09-20 (×5): qty 1

## 2012-09-20 MED ORDER — TIOTROPIUM BROMIDE MONOHYDRATE 18 MCG IN CAPS
18.0000 ug | ORAL_CAPSULE | Freq: Every day | RESPIRATORY_TRACT | Status: DC
  Administered 2012-09-21: 18 ug via RESPIRATORY_TRACT
  Filled 2012-09-20: qty 5

## 2012-09-20 MED ORDER — ESCITALOPRAM OXALATE 20 MG PO TABS
20.0000 mg | ORAL_TABLET | Freq: Every day | ORAL | Status: DC
  Administered 2012-09-21: 20 mg via ORAL
  Filled 2012-09-20: qty 1

## 2012-09-20 MED ORDER — CLONIDINE HCL ER 0.1 MG PO TB12
0.1000 mg | ORAL_TABLET | Freq: Two times a day (BID) | ORAL | Status: DC
  Administered 2012-09-20 – 2012-09-21 (×2): 0.1 mg via ORAL
  Filled 2012-09-20 (×4): qty 1

## 2012-09-20 MED ORDER — INSULIN ASPART 100 UNIT/ML ~~LOC~~ SOLN
0.0000 [IU] | Freq: Every day | SUBCUTANEOUS | Status: DC
  Administered 2012-09-20: 3 [IU] via SUBCUTANEOUS

## 2012-09-20 MED ORDER — ADULT MULTIVITAMIN W/MINERALS CH
1.0000 | ORAL_TABLET | Freq: Every day | ORAL | Status: DC
  Administered 2012-09-21: 1 via ORAL
  Filled 2012-09-20 (×2): qty 1

## 2012-09-20 MED ORDER — BISOPROLOL FUMARATE 5 MG PO TABS: 5.0000 mg | ORAL_TABLET | Freq: Every day | ORAL | Status: DC

## 2012-09-20 MED ORDER — SODIUM CHLORIDE 0.9 % IV SOLN: 250.0000 mL | INTRAVENOUS | Status: DC | PRN

## 2012-09-20 MED ORDER — ENOXAPARIN SODIUM 100 MG/ML ~~LOC~~ SOLN
100.0000 mg | SUBCUTANEOUS | Status: DC
  Administered 2012-09-20: 100 mg via SUBCUTANEOUS
  Filled 2012-09-20 (×2): qty 1

## 2012-09-20 MED ORDER — COLCHICINE 0.6 MG PO TABS
0.6000 mg | ORAL_TABLET | Freq: Every day | ORAL | Status: DC
  Administered 2012-09-21: 0.6 mg via ORAL
  Filled 2012-09-20: qty 1

## 2012-09-20 MED ORDER — ACETAMINOPHEN ER 650 MG PO TBCR: 1300.0000 mg | EXTENDED_RELEASE_TABLET | Freq: Two times a day (BID) | ORAL | Status: DC

## 2012-09-20 MED ORDER — INSULIN ASPART 100 UNIT/ML ~~LOC~~ SOLN
10.0000 [IU] | Freq: Three times a day (TID) | SUBCUTANEOUS | Status: DC
  Administered 2012-09-20 – 2012-09-21 (×2): 10 [IU] via SUBCUTANEOUS

## 2012-09-20 MED ORDER — HEPARIN BOLUS VIA INFUSION
4000.0000 [IU] | Freq: Once | INTRAVENOUS | Status: AC
  Administered 2012-09-20: 4000 [IU] via INTRAVENOUS
  Filled 2012-09-20: qty 4000

## 2012-09-20 MED ORDER — ADULT MULTIVITAMIN W/MINERALS CH: 0.5000 | ORAL_TABLET | Freq: Two times a day (BID) | ORAL | Status: DC

## 2012-09-20 MED ORDER — ONDANSETRON HCL 4 MG/2ML IJ SOLN: 4.0000 mg | Freq: Four times a day (QID) | INTRAMUSCULAR | Status: DC | PRN

## 2012-09-20 MED ORDER — SODIUM CHLORIDE 0.9 % IJ SOLN: 3.0000 mL | INTRAMUSCULAR | Status: DC | PRN

## 2012-09-20 MED ORDER — BISOPROLOL FUMARATE 5 MG PO TABS
5.0000 mg | ORAL_TABLET | Freq: Every day | ORAL | Status: DC
  Administered 2012-09-21: 5 mg via ORAL
  Filled 2012-09-20 (×2): qty 1

## 2012-09-20 MED ORDER — GABAPENTIN 100 MG PO TABS: 100.0000 mg | ORAL_TABLET | Freq: Every day | ORAL | Status: DC

## 2012-09-20 MED ORDER — INSULIN GLARGINE 100 UNIT/ML ~~LOC~~ SOLN
40.0000 [IU] | Freq: Two times a day (BID) | SUBCUTANEOUS | Status: DC
  Administered 2012-09-20 – 2012-09-21 (×2): 40 [IU] via SUBCUTANEOUS
  Filled 2012-09-20 (×4): qty 0.4

## 2012-09-20 MED ORDER — SODIUM CHLORIDE 0.9 % IJ SOLN
3.0000 mL | Freq: Two times a day (BID) | INTRAMUSCULAR | Status: DC
  Administered 2012-09-20 – 2012-09-21 (×2): 3 mL via INTRAVENOUS

## 2012-09-20 MED ORDER — INSULIN ASPART 100 UNIT/ML ~~LOC~~ SOLN
0.0000 [IU] | Freq: Three times a day (TID) | SUBCUTANEOUS | Status: DC
  Administered 2012-09-20 (×2): 11 [IU] via SUBCUTANEOUS
  Administered 2012-09-21 (×2): 4 [IU] via SUBCUTANEOUS

## 2012-09-20 MED ORDER — ATORVASTATIN CALCIUM 20 MG PO TABS
20.0000 mg | ORAL_TABLET | Freq: Every day | ORAL | Status: DC
  Administered 2012-09-20: 20 mg via ORAL
  Filled 2012-09-20 (×2): qty 1

## 2012-09-20 MED ORDER — ASPIRIN EC 325 MG PO TBEC
325.0000 mg | DELAYED_RELEASE_TABLET | Freq: Every day | ORAL | Status: DC
  Administered 2012-09-21: 325 mg via ORAL
  Filled 2012-09-20: qty 1

## 2012-09-20 MED ORDER — PANTOPRAZOLE SODIUM 40 MG PO TBEC
40.0000 mg | DELAYED_RELEASE_TABLET | Freq: Every day | ORAL | Status: DC
  Administered 2012-09-21: 40 mg via ORAL
  Filled 2012-09-20: qty 1

## 2012-09-20 MED ORDER — ALBUTEROL SULFATE HFA 108 (90 BASE) MCG/ACT IN AERS
2.0000 | INHALATION_SPRAY | RESPIRATORY_TRACT | Status: DC | PRN
  Filled 2012-09-20: qty 6.7

## 2012-09-20 MED ORDER — ACETAMINOPHEN 325 MG PO TABS: 650.0000 mg | ORAL_TABLET | ORAL | Status: DC | PRN

## 2012-09-20 MED ORDER — GABAPENTIN 100 MG PO CAPS
100.0000 mg | ORAL_CAPSULE | Freq: Every day | ORAL | Status: DC
  Administered 2012-09-20: 100 mg via ORAL
  Filled 2012-09-20 (×2): qty 1

## 2012-09-20 MED ORDER — ALLOPURINOL 300 MG PO TABS
300.0000 mg | ORAL_TABLET | Freq: Two times a day (BID) | ORAL | Status: DC
  Administered 2012-09-20 – 2012-09-21 (×2): 300 mg via ORAL
  Filled 2012-09-20 (×3): qty 1

## 2012-09-20 MED ORDER — EZETIMIBE 10 MG PO TABS
10.0000 mg | ORAL_TABLET | Freq: Every day | ORAL | Status: DC
  Administered 2012-09-21: 10 mg via ORAL
  Filled 2012-09-20: qty 1

## 2012-09-20 MED ORDER — ISOSORBIDE MONONITRATE ER 60 MG PO TB24
60.0000 mg | ORAL_TABLET | Freq: Every day | ORAL | Status: DC
  Administered 2012-09-21: 60 mg via ORAL
  Filled 2012-09-20: qty 1

## 2012-09-20 MED ORDER — AMLODIPINE BESYLATE 10 MG PO TABS
10.0000 mg | ORAL_TABLET | Freq: Every day | ORAL | Status: DC
Start: 2012-09-20 — End: 2012-09-21
  Administered 2012-09-21: 10 mg via ORAL
  Filled 2012-09-20: qty 1

## 2012-09-20 MED ORDER — FUROSEMIDE 80 MG PO TABS: 80.0000 mg | ORAL_TABLET | Freq: Two times a day (BID) | ORAL | Status: DC

## 2012-09-20 MED ORDER — PREDNISONE 5 MG PO TABS
5.0000 mg | ORAL_TABLET | Freq: Every day | ORAL | Status: DC
  Administered 2012-09-21: 5 mg via ORAL
  Filled 2012-09-20: qty 1

## 2012-09-20 NOTE — Progress Notes (Signed)
HPI:  79 year old gentleman presenting for followup evaluation. The patient has an extensive cardiac history with coronary artery disease dating back to his bypass surgery in 1997. He's undergone multiple PCI procedures. He has a history of contrast-induced nephropathy and has required hemodialysis in the past. He also has chronic anemia related to AVMs, chronic kidney disease, and diastolic heart failure requiring frequent hospitalizations. His wife takes very close care of him.  Unfortunately he has noted increasing chest discomfort over the past 24 hours. He describes a dull ache in the center of his chest, nonradiating constant since this morning. States the pain 'is not bad.' Denies associated dyspnea or diaphoresis. Has had similar pains in the past but only transient. Has not taken NTG. Other complaints include marked exercise intolerance and generalized fatigue.  Outpatient Encounter Prescriptions as of 09/20/2012  Medication Sig Dispense Refill  . acetaminophen (TYLENOL) 650 MG CR tablet Take 1,300 mg by mouth 2 (two) times daily.       Marland Kitchen albuterol (PROAIR HFA) 108 (90 BASE) MCG/ACT inhaler Inhale 2 puffs into the lungs every 4 (four) hours as needed for wheezing or shortness of breath.  3 Inhaler  3  . albuterol (PROVENTIL) (2.5 MG/3ML) 0.083% nebulizer solution Take 2.5 mg by nebulization every 6 (six) hours as needed. For shortness of breath      . allopurinol (ZYLOPRIM) 300 MG tablet Take 300 mg by mouth 2 (two) times daily.      Marland Kitchen amLODipine (NORVASC) 10 MG tablet Take 1 tablet (10 mg total) by mouth daily.  30 tablet  5  . aspirin EC 325 MG tablet Take 325 mg by mouth daily.      Marland Kitchen atorvastatin (LIPITOR) 20 MG tablet Take 20 mg by mouth at bedtime.      . cloNIDine HCl (KAPVAY) 0.1 MG TB12 ER tablet Take 1 tablet (0.1 mg total) by mouth 2 (two) times daily.  60 tablet  5  . colchicine 0.6 MG tablet Take 0.6 mg by mouth daily.       Marland Kitchen escitalopram (LEXAPRO) 20 MG tablet Take 20 mg by  mouth daily.      Marland Kitchen ezetimibe (ZETIA) 10 MG tablet Take 10 mg by mouth daily.        . folic acid (FOLVITE) 400 MCG tablet Take 400 mcg by mouth daily.        . furosemide (LASIX) 80 MG tablet Take 1 tablet (80 mg total) by mouth 2 (two) times daily.  180 tablet  3  . gabapentin (NEURONTIN) 100 MG tablet Take 100 mg by mouth at bedtime.        . insulin aspart (NOVOLOG FLEXPEN) 100 UNIT/ML injection Inject 10-20 Units into the skin 3 (three) times daily before meals. 20 units with breakfast, 20 units with lunch, and 20 units with supper      . insulin glargine (LANTUS SOLOSTAR) 100 UNIT/ML injection Inject 40 Units into the skin 2 (two) times daily.       . isosorbide mononitrate (IMDUR) 30 MG 24 hr tablet Take 60 mg by mouth daily.       . metolazone (ZAROXOLYN) 2.5 MG tablet Take one tablet by mouth 30 minutes prior to your morning Furosemide dose as needed based on weight  1 tablet  0  . Multiple Vitamin (MULTIVITAMIN WITH MINERALS) TABS Take 0.5 tablets by mouth 2 (two) times daily.      . nitroGLYCERIN (NITROSTAT) 0.4 MG SL tablet Place 0.4 mg under the tongue  every 5 (five) minutes as needed. May repeat x 3 for chest pain      . nystatin (MYCOSTATIN) 100000 UNIT/ML suspension Swish and swallow 500,000 Units as needed.       . pantoprazole (PROTONIX) 40 MG tablet Take 40 mg by mouth daily.        . predniSONE (DELTASONE) 5 MG tablet Take 1 tablet (5 mg total) by mouth daily.      Marland Kitchen tiotropium (SPIRIVA) 18 MCG inhalation capsule Place 18 mcg into inhaler and inhale daily.        No facility-administered encounter medications on file as of 09/20/2012.    Donepezil; Heparin; Hydralazine; Hydromorphone; Morphine; Plavix; Promethazine; Sulfonamide derivatives; Zaroxolyn; Cefuroxime; Metoprolol; and Ertapenem  Past Medical History  Diagnosis Date  . Adenomatous colon polyp   . Diverticulosis   . GERD (gastroesophageal reflux disease)   . Hiatal hernia   . Alzheimer disease   . Gout      "hands; backbone; elbows; shoulders"  . Asthma with bronchitis   . COPD (chronic obstructive pulmonary disease)   . Chronic hypoxemic respiratory failure   . OSA (obstructive sleep apnea)     CPAP  . CHF (congestive heart failure)     grade 2 diastolic dysfunction, EF 65-70% on ECHO 12/2011  . Blood transfusion     "I've had about 6 pints" (12/31/2011)  . Pneumonia     intermittent  . Anemia   . GI bleed     GI Dr. Marina Goodell,   . Hypertension   . Peripheral vascular disease     PAD  . Dysrhythmia     "skips"  . Anginal pain   . Myocardial infarction     "they said I had 2 light ones"  . Shortness of breath 12/31/2011    "all the time"  . Type II diabetes mellitus     with complications:  retinopathy, nephrophathy.   . Arthritis     "plenty"  . Chronic lower back pain   . Renal insufficiency     "went on dialysis probably 3 times" Last 04/2011  . Skin cancer     "forehead; arms"  . Depression   . Diverticulosis   . Colon polyps     adenomatous and hyperplastic  . GERD (gastroesophageal reflux disease)   . Hiatal hernia     Past Surgical History  Procedure Laterality Date  . Hand surgery  1990s    "chain saw accident; had to reattach fingers on left hand"  . Esophagogastroduodenoscopy  05/05/2011    Procedure: ESOPHAGOGASTRODUODENOSCOPY (EGD);  Surgeon: Louis Meckel, MD;  Location: [facility];  Service: Endoscopy;  Laterality: N/A;  . Flexible sigmoidoscopy  05/05/2011    Procedure: FLEXIBLE SIGMOIDOSCOPY;  Surgeon: Louis Meckel, MD;  Location: [facility]COPY;  Service: Endoscopy;  Laterality: N/A;  . Laparoscopic cholecystectomy  ~ 2001  . Excisional hemorrhoidectomy  1950's  . Cataract extraction w/ intraocular lens  implant, bilateral    . Coronary artery bypass graft  1997    CABG X2 vessels  . Iliac artery stent  11/2011    left/H&P  . Renal artery stent  2010    left/H&P  . Iliac artery stent  11/2011    right  . Coronary angioplasty with stent placement   12/31/2011    "I have 15 stents; heart, kidney, aorta, legs"  . Colonoscopy w/ biopsies and polypectomy      "have had probably 5 cut out" (12/31/2011)  . Enteroscopy  03/04/2012    Procedure: ENTEROSCOPY;  Surgeon: Louis Meckel, MD;  Location: [facility] ENDOSCOPY;  Service: Endoscopy;  Laterality: N/A;  jessica/leone  . Coronary artery bypass graft  1997    History   Social History  . Marital Status: Married    Spouse Name: N/A    Number of Children: 5  . Years of Education: N/A   Occupational History  . retired     Building services engineer supply   Social History Main Topics  . Smoking status: Former Smoker -- 2.50 packs/day for 45 years    Types: Cigarettes    Quit date: 05/25/1990  . Smokeless tobacco: Former Neurosurgeon    Types: Chew    Quit date: 06/01/1990  . Alcohol Use: No     Comment: 12/31/2011 "used to drink; last alcohol has been 30 years I imagine"  . Drug Use: No  . Sexually Active: Not Currently   Other Topics Concern  . Not on file   Social History Narrative   Daily caffeine use.  Lives in a house with his wife.  Uses a cane to ambulate and he has a walker, but he does not use it.  He does not cook or pay bills.  He does not drive.      Family History  Problem Relation Age of Onset  . Lung cancer Brother   . Diabetes Mother   . Arthritis Mother   . Heart disease Sister   . Stomach cancer Maternal Grandfather   . Heart disease Father   . Heart disease Brother   . ALS Sister   . ALS      nephew/neice  . Colon cancer Neg Hx     ROS:  General: no fevers/chills/night sweats, positive for marked fatigue Eyes: no blurry vision, diplopia, or amaurosis ENT: no sore throat or hearing loss Resp: no cough, wheezing, or hemoptysis CV: no edema or palpitations GI: no abdominal pain, nausea, vomiting, diarrhea, or constipation GU: no dysuria, frequency, or hematuria Skin: no rash Neuro: no headache, numbness, tingling, or weakness of extremities Musculoskeletal: no joint pain or  swelling Heme: no bleeding, DVT, or easy bruising Endo: no polydipsia or polyuria  BP 122/68  Pulse 67  Ht 5' 8.5" (1.74 m)  Wt 110.587 kg (243 lb 12.8 oz)  BMI 36.53 kg/m2  PHYSICAL EXAM: Pt is alert and oriented, WD, WN, pleasant obese male in no distress. HEENT: normal Neck: JVP normal. Carotid upstrokes normal with bilateral bruits. No thyromegaly. Lungs: equal expansion, clear bilaterally CV: Apex is discrete and nondisplaced, irregular without murmur or gallop Abd: soft, NT, +BS, obese Back: no CVA tenderness Ext: 1+ pretibial edema        DP/PT pulses diminished Skin: warm and dry without rash Neuro: CNII-XII intact             Strength intact = bilaterally  EKG:  Atrial fibrillation 121 bpm, nonspecific ST-T changes, consider inferolateral ischemia  ASSESSMENT AND PLAN: 1. Unstable angina. Reviewed cath films from 2009 and 2011. He has stenting of the SVG-OM and patent LIMA to LAD. Native RCA had nonobstructive disease and PDA collateralized from LAD. Multiple concerns for cath/PCI include bleeding risk from AVM's and risk of contrast nephropathy. Given 1 SL NTG in office with relief of pain. Will cycle enzymes, start IV heparin, continue ASA, and consider cath. May need to involve GI or heme depending on clinical course.  2. Atrial fibrillation with RVR. Admit to hospital. Rate control. IV heparin until decision made  about long term anticoagulation.  3. CKD, stage 3/4. Check BMET, volume status stable.   4. Chronic diastolic CHF. Repeat echo with new AF. Volume status is ok.   5. Type 2 DM - continue Glargine insulin with SSI as well.  Tonny Bollman 09/20/2012 10:55 AM

## 2012-09-20 NOTE — Progress Notes (Addendum)
ANTICOAGULATION CONSULT NOTE - Initial Consult  Pharmacy Consult for UFH Indication: atrial fibrillation and USAP  Allergies  Allergen Reactions  . Donepezil Diarrhea  . Heparin Itching and Rash  . Hydralazine Other (See Comments)    DO NOT TAKE PER MD  . Hydromorphone Itching  . Morphine Itching  . Plavix (Clopidogrel Bisulfate) Other (See Comments)    GI BLEEDING; "so bad I had to take blood; dr took me off it"  . Promethazine Other (See Comments)    respiratory failure  . Sulfonamide Derivatives Itching  . Zaroxolyn (Metolazone) Diarrhea  . Cefuroxime Other (See Comments)    REACTION: unkown reaction  . Metoprolol Other (See Comments)    unknown  . Ertapenem Itching and Rash    12/31/2011 pt does not recall allergy or severity    Patient Measurements: Height: 5\' 8"  (172.7 cm) Weight: 238 lb 3.2 oz (108.047 kg) IBW/kg (Calculated) : 68.4 Heparin Dosing Weight: 85kg  Vital Signs: Temp: 98.7 F (37.1 C) (04/29 1139) Temp src: Oral (04/29 1139) BP: 135/60 mmHg (04/29 1139) Pulse Rate: 75 (04/29 1139)  Labs:  Recent Labs  09/20/12 1100  HGB 12.6*  HCT 36.9*  PLT 204    Estimated Creatinine Clearance: 52.6 ml/min (by C-G formula based on Cr of 1.4).   Medical History: Past Medical History  Diagnosis Date  . Adenomatous colon polyp   . Diverticulosis   . GERD (gastroesophageal reflux disease)   . Hiatal hernia   . Alzheimer disease   . Gout     "hands; backbone; elbows; shoulders"  . Asthma with bronchitis   . COPD (chronic obstructive pulmonary disease)   . Chronic hypoxemic respiratory failure   . OSA (obstructive sleep apnea)     CPAP  . CHF (congestive heart failure)     grade 2 diastolic dysfunction, EF 65-70% on ECHO 12/2011  . Blood transfusion     "I've had about 6 pints" (12/31/2011)  . Pneumonia     intermittent  . Anemia   . GI bleed     GI Dr. Marina Goodell,   . Hypertension   . Peripheral vascular disease     PAD  . Dysrhythmia     "skips"   . Anginal pain   . Myocardial infarction     "they said I had 2 light ones"  . Shortness of breath 12/31/2011    "all the time"  . Type II diabetes mellitus     with complications:  retinopathy, nephrophathy.   . Arthritis     "plenty"  . Chronic lower back pain   . Renal insufficiency     "went on dialysis probably 3 times" Last 04/2011  . Skin cancer     "forehead; arms"  . Depression   . Diverticulosis   . Colon polyps     adenomatous and hyperplastic  . GERD (gastroesophageal reflux disease)   . Hiatal hernia     Medications:  Prescriptions prior to admission  Medication Sig Dispense Refill  . acetaminophen (TYLENOL) 650 MG CR tablet Take 1,300 mg by mouth 2 (two) times daily.       Marland Kitchen albuterol (PROAIR HFA) 108 (90 BASE) MCG/ACT inhaler Inhale 2 puffs into the lungs every 4 (four) hours as needed for wheezing or shortness of breath.  3 Inhaler  3  . albuterol (PROVENTIL) (2.5 MG/3ML) 0.083% nebulizer solution Take 2.5 mg by nebulization every 6 (six) hours as needed. For shortness of breath      .  allopurinol (ZYLOPRIM) 300 MG tablet Take 300 mg by mouth 2 (two) times daily.      Marland Kitchen amLODipine (NORVASC) 10 MG tablet Take 1 tablet (10 mg total) by mouth daily.  30 tablet  5  . aspirin EC 325 MG tablet Take 325 mg by mouth daily.      Marland Kitchen atorvastatin (LIPITOR) 20 MG tablet Take 20 mg by mouth at bedtime.      . cloNIDine HCl (KAPVAY) 0.1 MG TB12 ER tablet Take 1 tablet (0.1 mg total) by mouth 2 (two) times daily.  60 tablet  5  . colchicine 0.6 MG tablet Take 0.6 mg by mouth daily.       Marland Kitchen escitalopram (LEXAPRO) 20 MG tablet Take 20 mg by mouth daily.      Marland Kitchen ezetimibe (ZETIA) 10 MG tablet Take 10 mg by mouth daily.        . folic acid (FOLVITE) 400 MCG tablet Take 400 mcg by mouth daily.        . furosemide (LASIX) 80 MG tablet Take 1 tablet (80 mg total) by mouth 2 (two) times daily.  180 tablet  3  . gabapentin (NEURONTIN) 100 MG tablet Take 100 mg by mouth at bedtime.         . insulin aspart (NOVOLOG FLEXPEN) 100 UNIT/ML injection Inject 10-20 Units into the skin 3 (three) times daily before meals. 20 units with breakfast, 20 units with lunch, and 20 units with supper      . insulin glargine (LANTUS SOLOSTAR) 100 UNIT/ML injection Inject 40 Units into the skin 2 (two) times daily.       . isosorbide mononitrate (IMDUR) 30 MG 24 hr tablet Take 60 mg by mouth daily.       . metolazone (ZAROXOLYN) 2.5 MG tablet Take one tablet by mouth 30 minutes prior to your morning Furosemide dose as needed based on weight  1 tablet  0  . Multiple Vitamin (MULTIVITAMIN WITH MINERALS) TABS Take 0.5 tablets by mouth 2 (two) times daily.      . nitroGLYCERIN (NITROSTAT) 0.4 MG SL tablet Place 0.4 mg under the tongue every 5 (five) minutes as needed. May repeat x 3 for chest pain      . nystatin (MYCOSTATIN) 100000 UNIT/ML suspension Swish and swallow 500,000 Units as needed.       . pantoprazole (PROTONIX) 40 MG tablet Take 40 mg by mouth daily.        . predniSONE (DELTASONE) 5 MG tablet Take 1 tablet (5 mg total) by mouth daily.      Marland Kitchen tiotropium (SPIRIVA) 18 MCG inhalation capsule Place 18 mcg into inhaler and inhale daily.         Assessment: 79 y/o male patient admitted with chest pain and atrial fibrillation requiring anticoagulation. Possible cath, ce pending.  Goal of Therapy:  Heparin level 0.3-0.7 units/ml Monitor platelets by anticoagulation protocol: Yes   Plan:  Heparin 4000 unit IV bolus followed by infusion at 1400 units/hr. Check 8 hour heparin level with daily cbc and heparin level.  Verlene Mayer, PharmD, BCPS Pager [phone] 09/20/2012,12:15 PM  Patient and family report h/o heparin allergy/side effects, itching and hallucinations. However patient received heparin bolus and infusion without any issues. Spoke with PA, will transition to lovenox 1mg /kg and continue to follow.  Verlene Mayer, PharmD, BCPS Pager [phone]

## 2012-09-20 NOTE — Progress Notes (Signed)
Received phone call from family member concerned about patient receiving heparin. Per family member, "heparin almost killed him last time he was hospitalized at [facility]". Heparin drip stopped immediately. Theodore Demark, PA notified and made aware. Pt a/o at this time. VSS. No rashes or itching noted. Will continue to monitor closely. Levonne Spiller, RN

## 2012-09-20 NOTE — H&P (Signed)
[name] STATES  09/20/2012 9:45 AM   Office Visit  MRN:  [id number]   Description: 79 year old male  Provider: Tonny Bollman, MD  Department: [facility] [address]        Referring Provider    Kaleen Mask, MD      Diagnoses    Unstable angina pectoris    -  Primary    411.1    Atrial fibrillation with rapid ventricular response        427.31          Progress Notes    Tonny Bollman, MD at 09/20/2012 10:56 AM    Status: Sign at close encounter                      HPI:  78 year old gentleman presenting for followup evaluation. The patient has an extensive cardiac history with coronary artery disease dating back to his bypass surgery in 1997. He's undergone multiple PCI procedures. He has a history of contrast-induced nephropathy and has required hemodialysis in the past. He also has chronic anemia related to AVMs, chronic kidney disease, and diastolic heart failure requiring frequent hospitalizations. His wife takes very close care of him.   Unfortunately he has noted increasing chest discomfort over the past 24 hours. He describes a dull ache in the center of his chest, nonradiating constant since this morning. States the pain 'is not bad.' Denies associated dyspnea or diaphoresis. Has had similar pains in the past but only transient. Has not taken NTG. Other complaints include marked exercise intolerance and generalized fatigue.    Outpatient Encounter Prescriptions as of 09/20/2012   Medication  Sig  Dispense  Refill   .  acetaminophen (TYLENOL) 650 MG CR tablet  Take 1,300 mg by mouth 2 (two) times daily.          Marland Kitchen  albuterol (PROAIR HFA) 108 (90 BASE) MCG/ACT inhaler  Inhale 2 puffs into the lungs every 4 (four) hours as needed for wheezing or shortness of breath.   3 Inhaler   3   .  albuterol (PROVENTIL) (2.5 MG/3ML) 0.083% nebulizer solution  Take 2.5 mg by nebulization every 6 (six) hours as needed. For shortness of breath         .  allopurinol (ZYLOPRIM)  300 MG tablet  Take 300 mg by mouth 2 (two) times daily.         Marland Kitchen  amLODipine (NORVASC) 10 MG tablet  Take 1 tablet (10 mg total) by mouth daily.   30 tablet   5   .  aspirin EC 325 MG tablet  Take 325 mg by mouth daily.         Marland Kitchen  atorvastatin (LIPITOR) 20 MG tablet  Take 20 mg by mouth at bedtime.         .  cloNIDine HCl (KAPVAY) 0.1 MG TB12 ER tablet  Take 1 tablet (0.1 mg total) by mouth 2 (two) times daily.   60 tablet   5   .  colchicine 0.6 MG tablet  Take 0.6 mg by mouth daily.          Marland Kitchen  escitalopram (LEXAPRO) 20 MG tablet  Take 20 mg by mouth daily.         Marland Kitchen  ezetimibe (ZETIA) 10 MG tablet  Take 10 mg by mouth daily.           .  folic acid (FOLVITE) 400 MCG tablet  Take 400 mcg  by mouth daily.           .  furosemide (LASIX) 80 MG tablet  Take 1 tablet (80 mg total) by mouth 2 (two) times daily.   180 tablet   3   .  gabapentin (NEURONTIN) 100 MG tablet  Take 100 mg by mouth at bedtime.           .  insulin aspart (NOVOLOG FLEXPEN) 100 UNIT/ML injection  Inject 10-20 Units into the skin 3 (three) times daily before meals. 20 units with breakfast, 20 units with lunch, and 20 units with supper         .  insulin glargine (LANTUS SOLOSTAR) 100 UNIT/ML injection  Inject 40 Units into the skin 2 (two) times daily.          .  isosorbide mononitrate (IMDUR) 30 MG 24 hr tablet  Take 60 mg by mouth daily.          .  metolazone (ZAROXOLYN) 2.5 MG tablet  Take one tablet by mouth 30 minutes prior to your morning Furosemide dose as needed based on weight   1 tablet   0   .  Multiple Vitamin (MULTIVITAMIN WITH MINERALS) TABS  Take 0.5 tablets by mouth 2 (two) times daily.         .  nitroGLYCERIN (NITROSTAT) 0.4 MG SL tablet  Place 0.4 mg under the tongue every 5 (five) minutes as needed. May repeat x 3 for chest pain         .  nystatin (MYCOSTATIN) 100000 UNIT/ML suspension  Swish and swallow 500,000 Units as needed.          .  pantoprazole (PROTONIX) 40 MG tablet  Take 40 mg by mouth daily.            .  predniSONE (DELTASONE) 5 MG tablet  Take 1 tablet (5 mg total) by mouth daily.         Marland Kitchen  tiotropium (SPIRIVA) 18 MCG inhalation capsule  Place 18 mcg into inhaler and inhale daily.              No facility-administered encounter medications on file as of 09/20/2012.        Donepezil; Heparin; Hydralazine; Hydromorphone; Morphine; Plavix; Promethazine; Sulfonamide derivatives; Zaroxolyn; Cefuroxime; Metoprolol; and Ertapenem    Past Medical History   Diagnosis  Date   .  Adenomatous colon polyp     .  Diverticulosis     .  GERD (gastroesophageal reflux disease)     .  Hiatal hernia     .  Alzheimer disease     .  Gout         "hands; backbone; elbows; shoulders"   .  Asthma with bronchitis     .  COPD (chronic obstructive pulmonary disease)     .  Chronic hypoxemic respiratory failure     .  OSA (obstructive sleep apnea)         CPAP   .  CHF (congestive heart failure)         grade 2 diastolic dysfunction, EF 65-70% on ECHO 12/2011   .  Blood transfusion         "I've had about 6 pints" (12/31/2011)   .  Pneumonia         intermittent   .  Anemia     .  GI bleed         GI Dr. Marina Goodell,    .  Hypertension     .  Peripheral vascular disease         PAD   .  Dysrhythmia         "skips"   .  Anginal pain     .  Myocardial infarction         "they said I had 2 light ones"   .  Shortness of breath  12/31/2011       "all the time"   .  Type II diabetes mellitus         with complications:  retinopathy, nephrophathy.    .  Arthritis         "plenty"   .  Chronic lower back pain     .  Renal insufficiency         "went on dialysis probably 3 times" Last 04/2011   .  Skin cancer         "forehead; arms"   .  Depression     .  Diverticulosis     .  Colon polyps         adenomatous and hyperplastic   .  GERD (gastroesophageal reflux disease)     .  Hiatal hernia           Past Surgical History   Procedure  Laterality  Date   .  Hand surgery    1990s        "chain saw accident; had to reattach fingers on left hand"   .  Esophagogastroduodenoscopy    05/05/2011       Procedure: ESOPHAGOGASTRODUODENOSCOPY (EGD);  Surgeon: Louis Meckel, MD;  Location: [facility];  Service: Endoscopy;  Laterality: N/A;   .  Flexible sigmoidoscopy    05/05/2011       Procedure: FLEXIBLE SIGMOIDOSCOPY;  Surgeon: Louis Meckel, MD;  Location: [facility] ENDOSCOPY;  Service: Endoscopy;  Laterality: N/A;   .  Laparoscopic cholecystectomy    ~ 2001   .  Excisional hemorrhoidectomy    1950's   .  Cataract extraction w/ intraocular lens  implant, bilateral       .  Coronary artery bypass graft    1997       CABG X2 vessels   .  Iliac artery stent    11/2011       left/H&P   .  Renal artery stent    2010       left/H&P   .  Iliac artery stent    11/2011       right   .  Coronary angioplasty with stent placement    12/31/2011       "I have 15 stents; heart, kidney, aorta, legs"   .  Colonoscopy w/ biopsies and polypectomy           "have had probably 5 cut out" (12/31/2011)   .  Enteroscopy    03/04/2012       Procedure: ENTEROSCOPY;  Surgeon: Louis Meckel, MD;  Location: [facility] END[facility]COPY;  Service: Endoscopy;  Laterality: N/A;  jessica/leone   .  Coronary artery bypass graft    1997         History       Social History   .  Marital Status:  Married       Spouse Name:  N/A       Number of Children:  5   .  Years of Education:  N/A  Occupational History   .  retired         Building services engineer supply       Social History Main Topics   .  Smoking status:  Former Smoker -- 2.50 packs/day for 45 years       Types:  Cigarettes       Quit date:  05/25/1990   .  Smokeless tobacco:  Former Neurosurgeon       Types:  Chew       Quit date:  06/01/1990   .  Alcohol Use:  No         Comment: 12/31/2011 "used to drink; last alcohol has been 30 years I imagine"   .  Drug Use:  No   .  Sexually Active:  Not Currently       Other Topics  Concern   .  Not on file       Social  History Narrative     Daily caffeine use.  Lives in a house with his wife.  Uses a cane to ambulate and he has a walker, but he does not use it.  He does not cook or pay bills.  He does not drive.           Family History   Problem  Relation  Age of Onset   .  Lung cancer  Brother     .  Diabetes  Mother     .  Arthritis  Mother     .  Heart disease  Sister     .  Stomach cancer  Maternal Grandfather     .  Heart disease  Father     .  Heart disease  Brother     .  ALS  Sister     .  ALS           nephew/neice   .  Colon cancer  Neg Hx          ROS:   General: no fevers/chills/night sweats, positive for marked fatigue Eyes: no blurry vision, diplopia, or amaurosis ENT: no sore throat or hearing loss Resp: no cough, wheezing, or hemoptysis CV: no edema or palpitations GI: no abdominal pain, nausea, vomiting, diarrhea, or constipation GU: no dysuria, frequency, or hematuria Skin: no rash Neuro: no headache, numbness, tingling, or weakness of extremities Musculoskeletal: no joint pain or swelling Heme: no bleeding, DVT, or easy bruising Endo: no polydipsia or polyuria   BP 122/68  Pulse 67  Ht 5' 8.5" (1.74 m)  Wt 110.587 kg (243 lb 12.8 oz)  BMI 36.53 kg/m2   PHYSICAL EXAM: Pt is alert and oriented, WD, WN, pleasant obese male in no distress. HEENT: normal Neck: JVP normal. Carotid upstrokes normal with bilateral bruits. No thyromegaly. Lungs: equal expansion, clear bilaterally CV: Apex is discrete and nondisplaced, irregular without murmur or gallop Abd: soft, NT, +BS, obese Back: no CVA tenderness Ext: 1+ pretibial edema        DP/PT pulses diminished Skin: warm and dry without rash Neuro: CNII-XII intact             Strength intact = bilaterally   EKG:  Atrial fibrillation 121 bpm, nonspecific ST-T changes, consider inferolateral ischemia   ASSESSMENT AND PLAN: 1. Unstable angina. Reviewed cath films from 2009 and 2011. He has stenting of the SVG-OM  and patent LIMA to LAD. Native RCA had nonobstructive disease and PDA collateralized from LAD. Multiple concerns for cath/PCI include bleeding risk from AVM's  and risk of contrast nephropathy. Given 1 SL NTG in office with relief of pain. Will cycle enzymes, start IV heparin, continue ASA, and consider cath. May need to involve GI or heme depending on clinical course.   2. Atrial fibrillation with RVR. Admit to hospital. Rate control. IV heparin until decision made about long term anticoagulation.   3. CKD, stage 3/4. Check BMET, volume status stable.    4. Chronic diastolic CHF. Repeat echo with new AF. Volume status is ok.    5. Type 2 DM - continue Glargine insulin with SSI as well.   Tonny Bollman 09/20/2012 10:55 AM

## 2012-09-20 NOTE — Patient Instructions (Signed)
Admit to [facility]. 

## 2012-09-20 NOTE — Progress Notes (Signed)
Pt has doses own insulin at home. No way of getting a sliding scale because patient does not go by a certain sliding scale. Will notify PA. Levonne Spiller, RN

## 2012-09-20 NOTE — Progress Notes (Signed)
Pt c/o midsternal chest pressure that is 4/10. Pt given 1SL nitro with complete relief. 2L nasal cannula placed on patient for comfort. VSS. Will continue to monitor closely. Levonne Spiller, RN

## 2012-09-21 ENCOUNTER — Other Ambulatory Visit (HOSPITAL_COMMUNITY): Payer: Self-pay | Admitting: Physician Assistant

## 2012-09-21 DIAGNOSIS — I200 Unstable angina: Principal | ICD-10-CM | POA: Diagnosis present

## 2012-09-21 DIAGNOSIS — I480 Paroxysmal atrial fibrillation: Secondary | ICD-10-CM | POA: Diagnosis present

## 2012-09-21 LAB — MRSA PCR SCREENING: MRSA by PCR: POSITIVE — AB

## 2012-09-21 LAB — GLUCOSE, CAPILLARY: Glucose-Capillary: 160 mg/dL — ABNORMAL HIGH (ref 70–99)

## 2012-09-21 LAB — BASIC METABOLIC PANEL
Calcium: 9.2 mg/dL (ref 8.4–10.5)
Creatinine, Ser: 2.01 mg/dL — ABNORMAL HIGH (ref 0.50–1.35)
GFR calc Af Amer: 35 mL/min — ABNORMAL LOW (ref >90)

## 2012-09-21 LAB — CBC
MCHC: 33.5 g/dL (ref 30.0–36.0)
MCV: 89.4 fL (ref 78.0–100.0)
Platelets: 178 10*3/uL (ref 150–400)
RDW: 16.6 % — ABNORMAL HIGH (ref 11.5–15.5)
WBC: 7.8 10*3/uL (ref 4.0–10.5)

## 2012-09-21 LAB — LIPID PANEL
HDL: 24 mg/dL — ABNORMAL LOW (ref >39)
LDL Cholesterol: UNDETERMINED mg/dL (ref 0–99)
VLDL: UNDETERMINED mg/dL (ref 0–40)

## 2012-09-21 MED ORDER — MUPIROCIN 2 % EX OINT
1.0000 "application " | TOPICAL_OINTMENT | Freq: Two times a day (BID) | CUTANEOUS | Status: DC
  Administered 2012-09-21: 1 via NASAL
  Filled 2012-09-21: qty 22

## 2012-09-21 MED ORDER — CHLORHEXIDINE GLUCONATE CLOTH 2 % EX PADS
6.0000 | MEDICATED_PAD | Freq: Every day | CUTANEOUS | Status: DC
  Administered 2012-09-21: 6 via TOPICAL

## 2012-09-21 MED ORDER — TIOTROPIUM BROMIDE MONOHYDRATE 18 MCG IN CAPS: 18.0000 ug | ORAL_CAPSULE | Freq: Every day | RESPIRATORY_TRACT | Status: DC

## 2012-09-21 MED ORDER — BISOPROLOL FUMARATE 5 MG PO TABS: 5.0000 mg | ORAL_TABLET | Freq: Every day | ORAL | Status: DC

## 2012-09-21 NOTE — Discharge Summary (Signed)
CARDIOLOGY DISCHARGE SUMMARY   Patient ID: [name] MRN: [id number] DOB/AGE: Jan 22, 1934 79 y.o.  Admit date: 09/20/2012 Discharge date: 09/21/2012  Primary Discharge Diagnosis:   Intermediate coronary syndrome Secondary Discharge Diagnosis:    DM   HTN (hypertension)   HLD (hyperlipidemia)   Paroxysmal atrial fibrillation  Hospital Course: TINSLEY LOMAS is a 79 y.o. male with a history of CAD. He had increasing chest discomfort as well as tachycardia palpitations. He was seen in the office where his ECG showed rapid atrial fibrillation. He was admitted for further evaluation and treatment.  His chest pain was relieved with sublingual nitroglycerin in the office. He was started on IV heparin. He was already on aspirin and this was continued. His cardiac enzymes were cycled and were negative for MI. He has chronic diastolic CHF but his volume status was stable. His blood sugars were managed with his home insulin plus sliding scale.  Heparin was initiated because of a reported history of an allergic reaction to heparin. He had received a heparin bolus and then started on a drip but had had no problems with it prior to it being discontinued. He had recurrent chest pain but it was relieved with nitroglycerin. He was transitioned to Lovenox at 1 mg per kilogram every 12 hours. He had no side effects or reactions to this.  His cardiac enzymes were negative for MI. He has chronic renal insufficiency but his creatinine improved during his stay. A lipid profile was reviewed but no medication changes were indicated. His BNP was checked but was only slightly elevated above previous levels and chest x-ray showed no acute heart failure. He spontaneously converted to sinus rhythm. He has a history of GI bleeding and AVMs so chronic anticoagulation was not pursued at this time. He is to continue on aspirin.  On 09/21/2012, [name] was seen by Dr. Excell Seltzer. He was ambulating without chest pain or  shortness of breath and Dr. Excell Seltzer considered stable for discharge, to followup with an outpatient Myoview and office visit.   Labs:   Lab Results  Component Value Date   WBC 7.8 09/21/2012   HGB 12.2* 09/21/2012   HCT 36.4* 09/21/2012   MCV 89.4 09/21/2012   PLT 178 09/21/2012     Recent Labs Lab 09/20/12 1100 09/21/12 0535  NA 135 139  K 4.1 3.2*  CL 92* 97  CO2 31 31  BUN 57* 53*  CREATININE 2.35* 2.01*  CALCIUM 9.9 9.2  PROT 7.3  --   BILITOT 0.5  --   ALKPHOS 91  --   ALT 35  --   AST 39*  --   GLUCOSE 254* 160*    Recent Labs  09/20/12 1055 09/20/12 1657 09/20/12 2344  TROPONINI <0.30 <0.30 <0.30   Lipid Panel     Component Value Date/Time   CHOL 144 09/21/2012 0535   TRIG 425* 09/21/2012 0535   HDL 24* 09/21/2012 0535   CHOLHDL 6.0 09/21/2012 0535   VLDL UNABLE TO CALCULATE IF TRIGLYCERIDE OVER 400 mg/dL 2/84/1324 4010   LDLCALC UNABLE TO CALCULATE IF TRIGLYCERIDE OVER 400 mg/dL 2/72/5366 4403    Pro B Natriuretic peptide (BNP)  Date/Time Value Range Status  09/20/2012 10:55 AM 497.0* 0 - 450 pg/mL Final  05/04/2012  1:08 PM 404.0* 0.0 - 100.0 pg/mL Final    Recent Labs  09/20/12 1100  INR 0.98      Radiology: Portable Chest X-ray 1 View 09/20/2012  *RADIOLOGY REPORT*  Clinical Data:  Shortness of breath.  Chest pain.  PORTABLE CHEST - 1 VIEW  Comparison: 07/11/2012.  Findings: Right costophrenic angle not entirely included on the present exam.  Post mediastinotomy.  Cardiomegaly.  Pulmonary vascular prominence most notable centrally.  No segmental infiltrate or gross pneumothorax.  Calcified aorta.  IMPRESSION: Cardiomegaly.  Mild pulmonary vascular prominence most notable centrally and without change.  Calcified aorta.   Original Report Authenticated By: Lacy Duverney, M.D.    EKG:  Atrial fibrillation 121 bpm, nonspecific ST-T changes, consider inferolateral ischemia  FOLLOW UP PLANS AND APPOINTMENTS Allergies  Allergen Reactions  . Donepezil  Diarrhea  . Heparin Rash and Other (See Comments)    Family stated it almost killed him. Hallucinations and itching.  Marland Kitchen Hydralazine Other (See Comments)    DO NOT TAKE PER MD  . Hydromorphone Itching  . Morphine Itching  . Plavix (Clopidogrel Bisulfate) Other (See Comments)    GI BLEEDING; "so bad I had to take blood; dr took me off it"  . Promethazine Other (See Comments)    respiratory failure  . Sulfonamide Derivatives Itching  . Zaroxolyn (Metolazone) Diarrhea  . Cefuroxime Other (See Comments)    REACTION: unkown reaction  . Metoprolol Other (See Comments)    unknown  . Ertapenem Itching and Rash    12/31/2011 pt does not recall allergy or severity     Medication List    TAKE these medications       acetaminophen 650 MG CR tablet  Commonly known as:  TYLENOL  Take 1,300 mg by mouth 2 (two) times daily.     albuterol (2.5 MG/3ML) 0.083% nebulizer solution  Commonly known as:  PROVENTIL  Take 2.5 mg by nebulization every 6 (six) hours as needed. For shortness of breath     albuterol 108 (90 BASE) MCG/ACT inhaler  Commonly known as:  PROAIR HFA  Inhale 2 puffs into the lungs every 4 (four) hours as needed for wheezing or shortness of breath.     allopurinol 300 MG tablet  Commonly known as:  ZYLOPRIM  Take 300 mg by mouth 2 (two) times daily.     amLODipine 10 MG tablet  Commonly known as:  NORVASC  Take 1 tablet (10 mg total) by mouth daily.     aspirin EC 325 MG tablet  Take 325 mg by mouth daily.     atorvastatin 20 MG tablet  Commonly known as:  LIPITOR  Take 20 mg by mouth at bedtime.     bisoprolol 5 MG tablet  Commonly known as:  ZEBETA  Take 1 tablet (5 mg total) by mouth daily.     cloNIDine HCl 0.1 MG Tb12 ER tablet  Commonly known as:  KAPVAY  Take 1 tablet (0.1 mg total) by mouth 2 (two) times daily.     colchicine 0.6 MG tablet  Take 0.6 mg by mouth daily.     escitalopram 20 MG tablet  Commonly known as:  LEXAPRO  Take 20 mg by mouth  daily.     ezetimibe 10 MG tablet  Commonly known as:  ZETIA  Take 10 mg by mouth daily.     folic acid 400 MCG tablet  Commonly known as:  FOLVITE  Take 400 mcg by mouth daily.     furosemide 80 MG tablet  Commonly known as:  LASIX  Take 1 tablet (80 mg total) by mouth 2 (two) times daily.     gabapentin 100 MG tablet  Commonly known as:  NEURONTIN  Take 100 mg by mouth at bedtime.     isosorbide mononitrate 30 MG 24 hr tablet  Commonly known as:  IMDUR  Take 60 mg by mouth daily.     LANTUS SOLOSTAR 100 UNIT/ML injection  Generic drug:  insulin glargine  Inject 40 Units into the skin 2 (two) times daily.     metolazone 2.5 MG tablet  Commonly known as:  ZAROXOLYN  Take one tablet by mouth 30 minutes prior to your morning Furosemide dose as needed based on weight     multivitamin with minerals Tabs  Take 0.5 tablets by mouth 2 (two) times daily.     nitroGLYCERIN 0.4 MG SL tablet  Commonly known as:  NITROSTAT  Place 0.4 mg under the tongue every 5 (five) minutes as needed. May repeat x 3 for chest pain     NOVOLOG FLEXPEN 100 UNIT/ML injection  Generic drug:  insulin aspart  Inject 10-20 Units into the skin 3 (three) times daily before meals. 20 units with breakfast, 20 units with lunch, and 20 units with supper     nystatin 100000 UNIT/ML suspension  Commonly known as:  MYCOSTATIN  Swish and swallow 500,000 Units as needed.     pantoprazole 40 MG tablet  Commonly known as:  PROTONIX  Take 40 mg by mouth daily.     predniSONE 5 MG tablet  Commonly known as:  DELTASONE  Take 1 tablet (5 mg total) by mouth daily.     tiotropium 18 MCG inhalation capsule  Commonly known as:  SPIRIVA  Place 1 capsule (18 mcg total) into inhaler and inhale daily.        Discharge Orders   Future Appointments Provider Department Dept Phone   09/28/2012 9:30 AM [facility] [address] [facility]) [facility] SITE 3 NUCLEAR MED [phone]   10/05/2012 10:00 AM  Delcie Roch [facility] MEDICAL ONCOLOGY [phone]   10/05/2012 10:30 AM Exie Parody, MD [facility] MEDICAL ONCOLOGY [phone]   10/06/2012 11:50 AM Beatrice Lecher, PA-C [facility] Main Office [place]) [phone]   01/09/2013 10:30 AM Waymon Budge, MD [facility] [phone]   Future Orders Complete By Expires     (HEART FAILURE PATIENTS) Call MD:  Anytime you have any of the following symptoms: 1) 3 pound weight gain in 24 hours or 5 pounds in 1 week 2) shortness of breath, with or without a dry hacking cough 3) swelling in the hands, feet or stomach 4) if you have to sleep on extra pillows at night in order to breathe.  As directed     Diet - low sodium heart healthy  As directed     Diet Carb Modified  As directed     Increase activity slowly  As directed       Follow-up Information   Follow up with [facility] On 09/28/2012. (Stress test at 9:30 AM)    Contact information:   [address] [place] [address]       Follow up with Tereso Newcomer, PA-C On 10/06/2012. (See for Dr. Excell Seltzer at 11:50 AM)    Contact information:   [address] [place] [address] [phone]       BRING ALL MEDICATIONS WITH YOU TO FOLLOW UP APPOINTMENTS  Time spent with patient to include physician time: 34 min Signed: Theodore Demark, PA-C 09/21/2012, 11:52 AM Co-Sign MD

## 2012-09-21 NOTE — Progress Notes (Signed)
    Subjective:  Pt feels good this am. Had BM yesterday afternoon and feels much better since then. Did have one SL NTG yesterday providing relief of CP. No further CP this am. Dyspnea unchanged.  Objective:  Vital Signs in the last 24 hours: Temp:  [97.4 F (36.3 C)-98.7 F (37.1 C)] 97.4 F (36.3 C) (04/30 0500) Pulse Rate:  [64-75] 66 (04/30 0500) Resp:  [18] 18 (04/30 0500) BP: (122-163)/(48-71) 163/71 mmHg (04/30 0500) SpO2:  [90 %-98 %] 92 % (04/30 0820) Weight:  [107.321 kg (236 lb 9.6 oz)-110.587 kg (243 lb 12.8 oz)] 107.321 kg (236 lb 9.6 oz) (04/30 0500)  Intake/Output from previous day: 04/29 0701 - 04/30 0700 In: 363 [P.O.:360; I.V.:3] Out: 700 [Urine:700]  Physical Exam: Pt is alert and oriented, obese male in NAD HEENT: normal Neck: JVP - normal Lungs: CTA bilaterally CV: RRR without murmur or gallop Abd: soft, NT, obese Ext: no C/C/E, distal pulses intact and equal Skin: warm/dry no rash   Lab Results:  Recent Labs  09/20/12 1100 09/21/12 0535  WBC 11.1* 7.8  HGB 12.6* 12.2*  PLT 204 178    Recent Labs  09/20/12 1100 09/21/12 0535  NA 135 139  K 4.1 3.2*  CL 92* 97  CO2 31 31  GLUCOSE 254* 160*  BUN 57* 53*  CREATININE 2.35* 2.01*    Recent Labs  09/20/12 1657 09/20/12 2344  TROPONINI <0.30 <0.30   Tele: Sinus rhythm, PAC's  Assessment/Plan:  1. Chest pain, likely GI etiology. Yesterday suspected ACS especially with response to NTG, but he has ruled out for MI and noted relief of sx's after a BM. Considered cardiac cath but significant risk in setting of CKD and history of recurrent GI bleeds. Would favor ongoing medical management and outpatient Lexiscan Myoview stress test for further risk stratification. OK for discharge home today.  2. pSVT - yesterday EKG in office suggestive of atrial fib with heart rate 122 bpm (this EKG not scanned into EPIC), but he has been in sinus rhythm since on tele here. Would observe for now.  Considering his recent history of GI bleeding, I am not inclined to pursue anticoagulation as I thinnk risk would outweigh benefit even if AF present.  Other medical problems stable. OK for discharge home today with outpatient Myoview and routine office follow-up. I would not make any med changes.  Tonny Bollman, M.D. 09/21/2012, 9:14 AM

## 2012-09-28 ENCOUNTER — Ambulatory Visit (HOSPITAL_COMMUNITY): Payer: BC Managed Care – PPO | Attending: Cardiovascular Disease | Admitting: Radiology

## 2012-09-28 VITALS — BP 123/53 | HR 45 | Ht 68.5 in | Wt 245.0 lb

## 2012-09-28 DIAGNOSIS — E119 Type 2 diabetes mellitus without complications: Secondary | ICD-10-CM | POA: Insufficient documentation

## 2012-09-28 DIAGNOSIS — R42 Dizziness and giddiness: Secondary | ICD-10-CM | POA: Insufficient documentation

## 2012-09-28 DIAGNOSIS — Z87891 Personal history of nicotine dependence: Secondary | ICD-10-CM | POA: Insufficient documentation

## 2012-09-28 DIAGNOSIS — I10 Essential (primary) hypertension: Secondary | ICD-10-CM | POA: Insufficient documentation

## 2012-09-28 DIAGNOSIS — I6529 Occlusion and stenosis of unspecified carotid artery: Secondary | ICD-10-CM | POA: Insufficient documentation

## 2012-09-28 DIAGNOSIS — Z9861 Coronary angioplasty status: Secondary | ICD-10-CM | POA: Insufficient documentation

## 2012-09-28 DIAGNOSIS — Z794 Long term (current) use of insulin: Secondary | ICD-10-CM | POA: Insufficient documentation

## 2012-09-28 DIAGNOSIS — I2510 Atherosclerotic heart disease of native coronary artery without angina pectoris: Secondary | ICD-10-CM

## 2012-09-28 DIAGNOSIS — Z951 Presence of aortocoronary bypass graft: Secondary | ICD-10-CM | POA: Insufficient documentation

## 2012-09-28 DIAGNOSIS — Z8249 Family history of ischemic heart disease and other diseases of the circulatory system: Secondary | ICD-10-CM | POA: Insufficient documentation

## 2012-09-28 DIAGNOSIS — R0602 Shortness of breath: Secondary | ICD-10-CM

## 2012-09-28 DIAGNOSIS — I4891 Unspecified atrial fibrillation: Secondary | ICD-10-CM

## 2012-09-28 DIAGNOSIS — I200 Unstable angina: Secondary | ICD-10-CM

## 2012-09-28 DIAGNOSIS — R079 Chest pain, unspecified: Secondary | ICD-10-CM

## 2012-09-28 MED ORDER — REGADENOSON 0.4 MG/5ML IV SOLN
0.4000 mg | Freq: Once | INTRAVENOUS | Status: AC
  Administered 2012-09-28: 0.4 mg via INTRAVENOUS

## 2012-09-28 MED ORDER — TECHNETIUM TC 99M SESTAMIBI GENERIC - CARDIOLITE
33.0000 | Freq: Once | INTRAVENOUS | Status: AC | PRN
  Administered 2012-09-28: 33 via INTRAVENOUS

## 2012-09-28 MED ORDER — TECHNETIUM TC 99M SESTAMIBI GENERIC - CARDIOLITE
11.0000 | Freq: Once | INTRAVENOUS | Status: AC | PRN
  Administered 2012-09-28: 11 via INTRAVENOUS

## 2012-09-28 NOTE — Progress Notes (Signed)
[facility] SITE 3 NUCLEAR MED [address] [place], [place] [address] [phone]    Cardiology Nuclear Med Study  [name] is a 79 y.o. male     MRN : [id number]     DOB: February 12, 1934  Procedure Date: 09/28/2012  Nuclear Med Background Indication for Stress Test:  Evaluation for Ischemia,  PTCA/Stent and Graft Patency History:  Multiple PCI's; '97 CABG; '11 NSTEMI>Cath:medical tx; '13 ION:GEXBMW, EF=60%; '13 Echo:EF=65-70% Cardiac Risk Factors: Carotid Disease, Family History - CAD, History of Smoking, Hypertension, IDDM Type 2, Lipids and PVD  Symptoms:  Chest Pressure.  (last episode of chest discomfort has been none since discharge from hospital), Dizziness, DOE, Palpitations, Rapid HR and SOB   Nuclear Pre-Procedure Caffeine/Decaff Intake:  None NPO After: 11:00pm   Lungs:  Clear. O2 Sat: 96% on room air. IV 0.9% NS with Angio Cath:  22g  IV Site: R Antecubital  IV Started by:  Bonnita Levan, RN  Chest Size (in):  50 + Cup Size: n/a  Height: 5' 8.5" (1.74 m)  Weight:  245 lb (111.131 kg)  BMI:  Body mass index is 36.71 kg/(m^2). Tech Comments:  BS @ 8 am = 275    Nuclear Med Study 1 or 2 day study: 1 day  Stress Test Type:  Lexiscan  Reading MD: Charlton Haws, MD  Order Authorizing Provider:  Tonny Bollman, MD  Resting Radionuclide: Technetium 86m Sestamibi  Resting Radionuclide Dose: 11.0 mCi   Stress Radionuclide:  Technetium 22m Sestamibi  Stress Radionuclide Dose: 33.0 mCi           Stress Protocol Rest HR: 45 Stress HR: 57  Rest BP: 123/53 Stress BP: 134/52  Exercise Time (min): n/a METS: n/a   Predicted Max HR: 143 bpm % Max HR: 39.86 bpm Rate Pressure Product: 7638   Dose of Adenosine (mg):  n/a Dose of Lexiscan: 0.4 mg  Dose of Atropine (mg): n/a Dose of Dobutamine: n/a mcg/kg/min (at max HR)  Stress Test Technologist: Smiley Houseman, CMA-N  Nuclear Technologist:  Domenic Polite, CNMT     Rest Procedure:  Myocardial perfusion imaging  was performed at rest 45 minutes following the intravenous administration of Technetium 56m Sestamibi.  Rest ECG: NSR-LVH  Stress Procedure:  The patient received IV Lexiscan 0.4 mg over 15-seconds.  Technetium 70m Sestamibi injected at 30-seconds.  Quantitative spect images were obtained after a 45 minute delay.  Stress ECG: No significant change from baseline ECG  QPS Raw Data Images:  Normal; no motion artifact; normal heart/lung ratio. Stress Images:  Normal homogeneous uptake in all areas of the myocardium. Rest Images:  Normal homogeneous uptake in all areas of the myocardium. Subtraction (SDS):  Normal Transient Ischemic Dilatation (Normal <1.22):  1.07 Lung/Heart Ratio (Normal <0.45):  0.33  Quantitative Gated Spect Images QGS EDV:  124 ml QGS ESV:  43 ml  Impression Exercise Capacity:  Lexiscan with no exercise. BP Response:  Normal blood pressure response. Clinical Symptoms:  There is dyspnea. ECG Impression:  No significant ST segment change suggestive of ischemia. Comparison with Prior Nuclear Study: No images to compare  Overall Impression:  Normal stress nuclear study.  RV appears dilated  LV Ejection Fraction: 65%.  LV Wall Motion:  NL LV Function; NL Wall Motion  Charlton Haws

## 2012-10-03 ENCOUNTER — Telehealth: Payer: Self-pay | Admitting: Oncology

## 2012-10-05 ENCOUNTER — Ambulatory Visit: Payer: BC Managed Care – PPO | Admitting: Oncology

## 2012-10-05 ENCOUNTER — Other Ambulatory Visit: Payer: BC Managed Care – PPO | Admitting: Lab

## 2012-10-06 ENCOUNTER — Encounter: Payer: BC Managed Care – PPO | Admitting: Physician Assistant

## 2012-10-10 ENCOUNTER — Encounter: Payer: Self-pay | Admitting: Physician Assistant

## 2012-10-10 ENCOUNTER — Ambulatory Visit (INDEPENDENT_AMBULATORY_CARE_PROVIDER_SITE_OTHER): Payer: BC Managed Care – PPO | Admitting: Physician Assistant

## 2012-10-10 VITALS — BP 138/52 | HR 56 | Ht 68.5 in | Wt 243.4 lb

## 2012-10-10 DIAGNOSIS — I5032 Chronic diastolic (congestive) heart failure: Secondary | ICD-10-CM

## 2012-10-10 DIAGNOSIS — I2510 Atherosclerotic heart disease of native coronary artery without angina pectoris: Secondary | ICD-10-CM

## 2012-10-10 DIAGNOSIS — I4891 Unspecified atrial fibrillation: Secondary | ICD-10-CM

## 2012-10-10 DIAGNOSIS — N189 Chronic kidney disease, unspecified: Secondary | ICD-10-CM

## 2012-10-10 DIAGNOSIS — I10 Essential (primary) hypertension: Secondary | ICD-10-CM

## 2012-10-10 DIAGNOSIS — E785 Hyperlipidemia, unspecified: Secondary | ICD-10-CM

## 2012-10-10 LAB — TSH: TSH: 3.11 u[IU]/mL (ref 0.35–5.50)

## 2012-10-10 LAB — BASIC METABOLIC PANEL
Calcium: 9.1 mg/dL (ref 8.4–10.5)
GFR: 26.61 mL/min — ABNORMAL LOW (ref >60.00)
Potassium: 4 mEq/L (ref 3.5–5.1)
Sodium: 135 mEq/L (ref 135–145)

## 2012-10-10 LAB — T4, FREE: Free T4: 0.74 ng/dL (ref 0.60–1.60)

## 2012-10-10 NOTE — Patient Instructions (Addendum)
Your physician recommends that you continue on your current medications as directed. Please refer to the Current Medication list given to you today.   Your physician recommends that you return for lab work today bmet/tsh/[id number]  Your physician recommends that you schedule a follow-up appointment on Wednesday, 8/6 @ 9:45am with Dr. Excell Seltzer

## 2012-10-10 NOTE — Progress Notes (Signed)
[address] [place], [place]  [address] Phone: [phone] Fax:  [phone]  Date:  10/10/2012   ID:  [name], DOB 1934-04-22, MRN [id number]  PCP:  Kaleen Mask, MD  Primary Cardiologist:  Dr. Tonny Bollman     History of Present Illness: [name] is a 79 y.o. male who returns for followup after a recent admission to the hospital 4/29-4/30 with chest pain concerning for unstable angina. He has a history of extensive CAD. He is status post CABG in 1997. He has undergone multiple PCI procedures complicated in the past by contrast-induced nephropathy requiring hemodialysis. He also has a history of chronic anemia related to AVMs, diastolic CHF requiring frequent hospitalizations and CKD.  Last LHC 08/2009: Ostial LM 70-80%, LAD occluded, proximal CFX 90%, major OM occluded, proximal RCA 20%, distal RCA 30%, SVG-OM1 patent with patent stents, LIMA-LAD patent with 70% LAD stenosis in the distal vessel. Medical therapy recommended at that time. Echo 12/2011: EF 65-70%, grade 2 diastolic dysfunction, MAC, mild LAE. Patient seen in the office by Dr. Excell Seltzer 09/20/12 with complaints of increasing chest discomfort over the previous 24 hours. He had relief of pain in the office with sublingual nitroglycerin x1. He was noted to be in atrial fibrillation with RVR (HR 121). He was placed on rate control therapy and IV heparin. He ruled out for myocardial infarction by enzymes.  Symptoms improved after having a bowel movement. Given history of CAD and recurrent GI bleeds, medical therapy was favored. He was set up for an outpatient Myoview. Myoview 09/28/12: EF 65%, normal wall motion, no ischemia. Patient was noted to remain in sinus rhythm throughout his hospitalization. With this history of recurrent GI bleeding, he has not felt to be a candidate for anticoagulation.  Since discharge, he has done well. He denies any further chest symptoms. Breathing is stable. He  denies syncope. He denies orthopnea, PND. LE edema is stable. Denies palpitations.  Labs (4/14):  K 3.2, Cr 2.01, ALT 35, Hgb 12.2, TSH 4.676  Wt Readings from Last 3 Encounters:  10/10/12 243 lb 6.4 oz (110.406 kg)  09/28/12 245 lb (111.131 kg)  09/21/12 236 lb 9.6 oz (107.321 kg)     Past Medical History  Diagnosis Date  . Adenomatous colon polyp   . Diverticulosis   . GERD (gastroesophageal reflux disease)   . Hiatal hernia   . Alzheimer disease   . Gout     "hands; backbone; elbows; shoulders"  . Asthma with bronchitis   . COPD (chronic obstructive pulmonary disease)   . Chronic hypoxemic respiratory failure   . OSA (obstructive sleep apnea)     CPAP  . CHF (congestive heart failure)     grade 2 diastolic dysfunction, EF 65-70% on ECHO 12/2011  . Blood transfusion     "I've had about 6 pints" (12/31/2011)  . Pneumonia     intermittent  . Anemia   . GI bleed     GI Dr. Marina Goodell,   . Hypertension   . Peripheral vascular disease     PAD  . Dysrhythmia     "skips"  . Anginal pain   . Myocardial infarction     "they said I had 2 light ones"  . Shortness of breath 12/31/2011    "all the time"  . Type II diabetes mellitus     with complications:  retinopathy, nephrophathy.   . Arthritis     "plenty"  . Chronic lower back  pain   . Renal insufficiency     "went on dialysis probably 3 times" Last 04/2011  . Skin cancer     "forehead; arms"  . Depression   . Diverticulosis   . Colon polyps     adenomatous and hyperplastic  . GERD (gastroesophageal reflux disease)   . Hiatal hernia     Current Outpatient Prescriptions  Medication Sig Dispense Refill  . acetaminophen (TYLENOL) 650 MG CR tablet Take 1,300 mg by mouth 2 (two) times daily.       Marland Kitchen albuterol (PROAIR HFA) 108 (90 BASE) MCG/ACT inhaler Inhale 2 puffs into the lungs every 4 (four) hours as needed for wheezing or shortness of breath.  3 Inhaler  3  . albuterol (PROVENTIL) (2.5 MG/3ML) 0.083% nebulizer solution  Take 2.5 mg by nebulization every 6 (six) hours as needed. For shortness of breath      . allopurinol (ZYLOPRIM) 300 MG tablet Take 300 mg by mouth 2 (two) times daily.      Marland Kitchen amLODipine (NORVASC) 10 MG tablet Take 1 tablet (10 mg total) by mouth daily.  30 tablet  5  . aspirin EC 325 MG tablet Take 325 mg by mouth daily.      Marland Kitchen atorvastatin (LIPITOR) 20 MG tablet Take 20 mg by mouth at bedtime.      . bisoprolol (ZEBETA) 5 MG tablet Take 1 tablet (5 mg total) by mouth daily.  30 tablet  11  . cloNIDine HCl (KAPVAY) 0.1 MG TB12 ER tablet Take 1 tablet (0.1 mg total) by mouth 2 (two) times daily.  60 tablet  5  . colchicine 0.6 MG tablet Take 0.6 mg by mouth daily.       Marland Kitchen escitalopram (LEXAPRO) 20 MG tablet Take 20 mg by mouth daily.      Marland Kitchen ezetimibe (ZETIA) 10 MG tablet Take 10 mg by mouth daily.        . folic acid (FOLVITE) 400 MCG tablet Take 400 mcg by mouth daily.        . furosemide (LASIX) 80 MG tablet Take 1 tablet (80 mg total) by mouth 2 (two) times daily.  180 tablet  3  . gabapentin (NEURONTIN) 100 MG tablet Take 100 mg by mouth at bedtime.        . insulin aspart (NOVOLOG) 100 UNIT/ML injection Inject 20 Units into the skin 3 (three) times daily with meals.      . insulin glargine (LANTUS) 100 UNIT/ML injection Inject 52 Units into the skin 2 (two) times daily.      . isosorbide mononitrate (IMDUR) 30 MG 24 hr tablet Take 60 mg by mouth daily.       . metolazone (ZAROXOLYN) 2.5 MG tablet Take one tablet by mouth 30 minutes prior to your morning Furosemide dose as needed based on weight  1 tablet  0  . Multiple Vitamin (MULTIVITAMIN WITH MINERALS) TABS Take 0.5 tablets by mouth 2 (two) times daily.      . nitroGLYCERIN (NITROSTAT) 0.4 MG SL tablet Place 0.4 mg under the tongue every 5 (five) minutes as needed. May repeat x 3 for chest pain      . pantoprazole (PROTONIX) 40 MG tablet Take 40 mg by mouth daily.        . predniSONE (DELTASONE) 5 MG tablet Take 1 tablet (5 mg total) by  mouth daily.      Marland Kitchen tiotropium (SPIRIVA) 18 MCG inhalation capsule Place 18 mcg into inhaler and inhale daily.  No current facility-administered medications for this visit.    Allergies:    Allergies  Allergen Reactions  . Donepezil Diarrhea  . Heparin Rash and Other (See Comments)    Family stated it almost killed him. Hallucinations and itching.  Marland Kitchen Hydralazine Other (See Comments)    DO NOT TAKE PER MD  . Hydromorphone Itching  . Morphine Itching  . Plavix (Clopidogrel Bisulfate) Other (See Comments)    GI BLEEDING; "so bad I had to take blood; dr took me off it"  . Promethazine Other (See Comments)    respiratory failure  . Sulfonamide Derivatives Itching  . Zaroxolyn (Metolazone) Diarrhea  . Cefuroxime Other (See Comments)    REACTION: unkown reaction  . Metoprolol Other (See Comments)    unknown  . Ertapenem Itching and Rash    12/31/2011 pt does not recall allergy or severity    Social History:  The patient  reports that he quit smoking about 22 years ago. His smoking use included Cigarettes. He has a 112.5 pack-year smoking history. He quit smokeless tobacco use about 22 years ago. His smokeless tobacco use included Chew. He reports that he does not drink alcohol or use illicit drugs.   ROS:  Please see the history of present illness.   All other systems reviewed and negative.   PHYSICAL EXAM: VS:  BP 138/52  Pulse 56  Ht 5' 8.5" (1.74 m)  Wt 243 lb 6.4 oz (110.406 kg)  BMI 36.47 kg/m2 Well nourished, well developed, in no acute distress HEENT: normal Neck: no JVD Cardiac:  normal S1, S2; RRR; no murmur Lungs:  clear to auscultation bilaterally, no wheezing, rhonchi or rales Abd: soft, nontender, no hepatomegaly Ext: trace to 1+ bilateral LE edema Skin: warm and dry Neuro:  CNs 2-12 intact, no focal abnormalities noted  EKG:  Sinus bradycardia, HR 56, normal axis, nonspecific ST-T wave changes, PVC     ASSESSMENT AND PLAN:  1. CAD:  No further chest  symptoms. Recent Myoview low risk. Continue medical therapy. Continue aspirin, statin, nitrates. 2. Atrial Fibrillation:  This was short lived. He has not had further recurrence. He is not a good candidate for chronic anticoagulation given history of chronic GI bleeding.  Continue current therapy. 3. Hypertension: Controlled. 4. Hyperlipidemia: Continue statin. 5. Chronic Diastolic CHF: Volume stable. Continue current therapy. Check a followup basic metabolic panel today. 6. CKD: Check repeat basic metabolic panel today. Continue followup with nephrology as directed. 7. Disposition: Followup with Dr. Excell Seltzer in 3 months.  Signed, Tereso Newcomer, PA-C  10:31 AM 10/10/2012

## 2012-12-28 ENCOUNTER — Ambulatory Visit (INDEPENDENT_AMBULATORY_CARE_PROVIDER_SITE_OTHER): Payer: BC Managed Care – PPO | Admitting: Cardiovascular Disease

## 2012-12-28 ENCOUNTER — Encounter: Payer: Self-pay | Admitting: Cardiovascular Disease

## 2012-12-28 VITALS — BP 140/48 | HR 60 | Ht 68.0 in | Wt 245.0 lb

## 2012-12-28 DIAGNOSIS — I4891 Unspecified atrial fibrillation: Secondary | ICD-10-CM

## 2012-12-28 DIAGNOSIS — I739 Peripheral vascular disease, unspecified: Secondary | ICD-10-CM

## 2012-12-28 DIAGNOSIS — I2510 Atherosclerotic heart disease of native coronary artery without angina pectoris: Secondary | ICD-10-CM

## 2012-12-28 LAB — BASIC METABOLIC PANEL
Calcium: 9.4 mg/dL (ref 8.4–10.5)
GFR: 28 mL/min — ABNORMAL LOW (ref >60.00)
Potassium: 4 mEq/L (ref 3.5–5.1)
Sodium: 135 mEq/L (ref 135–145)

## 2012-12-28 NOTE — Patient Instructions (Signed)
Your physician recommends that you have lab work today: [facility]  Your physician has requested that you have a lower extremity arterial duplex with ABI in 4 MONTHS. This test is an ultrasound of the arteries in the legs. It looks at arterial blood flow in the legs. Allow one hour for Lower Arterial scans. There are no restrictions or special instructions  Your physician recommends that you schedule a follow-up appointment in: 4 MONTHS with Dr Excell Seltzer  Your physician recommends that you continue on your current medications as directed. Please refer to the Current Medication list given to you today.

## 2012-12-28 NOTE — Progress Notes (Signed)
HPI:  79 year old gentleman presenting for followup evaluation. He has extensive cardiovascular disease. The patient had CABG in 1997. He's undergone multiple PCI procedures. He's had contrast-induced nephropathy in the past requiring short-term hemodialysis. He presented several months ago with chest pain and had a nuclear stress scan showing no significant ischemia. Medical management was recommended. He's also had diastolic heart failure and peripheral arterial disease. The patient is morbidly obese with a limited activity level. He is extremely noncompliant with diet. He does take his medications as prescribed. He follows up with daily weights and uses diuretics on a sliding scale.  He denies recurrence of chest pain or pressure. He's had no orthopnea, PND, palpitations, or syncope.  Outpatient Encounter Prescriptions as of 12/28/2012  Medication Sig Dispense Refill  . acetaminophen (TYLENOL) 650 MG CR tablet TAKE 1300 MG BY MOUTH 2 TIMES DAILY      . albuterol (PROAIR HFA) 108 (90 BASE) MCG/ACT inhaler Inhale 2 puffs into the lungs every 4 (four) hours as needed for wheezing or shortness of breath.  3 Inhaler  3  . albuterol (PROVENTIL) (2.5 MG/3ML) 0.083% nebulizer solution Take 2.5 mg by nebulization every 6 (six) hours as needed. For shortness of breath      . allopurinol (ZYLOPRIM) 300 MG tablet Take 300 mg by mouth 2 (two) times daily.      Marland Kitchen amLODipine (NORVASC) 10 MG tablet Take 1 tablet (10 mg total) by mouth daily.  30 tablet  5  . aspirin EC 325 MG tablet Take 325 mg by mouth daily.      Marland Kitchen atorvastatin (LIPITOR) 20 MG tablet Take 20 mg by mouth at bedtime.      . bisoprolol (ZEBETA) 5 MG tablet Take 1 tablet (5 mg total) by mouth daily.  30 tablet  11  . cloNIDine HCl (KAPVAY) 0.1 MG TB12 ER tablet Take 1 tablet (0.1 mg total) by mouth 2 (two) times daily.  60 tablet  5  . colchicine 0.6 MG tablet Take 0.6 mg by mouth daily.       Marland Kitchen escitalopram (LEXAPRO) 20 MG tablet Take 20 mg by  mouth daily.      Marland Kitchen ezetimibe (ZETIA) 10 MG tablet Take 10 mg by mouth daily.        . folic acid (FOLVITE) 400 MCG tablet Take 400 mcg by mouth daily.        . furosemide (LASIX) 80 MG tablet Take 1 tablet (80 mg total) by mouth 2 (two) times daily.  180 tablet  3  . gabapentin (NEURONTIN) 100 MG tablet Take 100 mg by mouth at bedtime.        . insulin aspart (NOVOLOG) 100 UNIT/ML injection Inject 20 Units into the skin 3 (three) times daily with meals.      . insulin glargine (LANTUS) 100 UNIT/ML injection Inject 52 Units into the skin 2 (two) times daily.      . isosorbide mononitrate (IMDUR) 30 MG 24 hr tablet Take 60 mg by mouth daily.       . metolazone (ZAROXOLYN) 2.5 MG tablet Take one tablet by mouth 30 minutes prior to your morning Furosemide dose as needed based on weight  1 tablet  0  . Multiple Vitamin (MULTIVITAMIN WITH MINERALS) TABS Take 0.5 tablets by mouth 2 (two) times daily.      . nitroGLYCERIN (NITROSTAT) 0.4 MG SL tablet Place 0.4 mg under the tongue every 5 (five) minutes as needed. May repeat x 3 for chest pain      .  pantoprazole (PROTONIX) 40 MG tablet Take 40 mg by mouth daily.        . predniSONE (DELTASONE) 5 MG tablet Take 1 tablet (5 mg total) by mouth daily.      Marland Kitchen tiotropium (SPIRIVA) 18 MCG inhalation capsule Place 18 mcg into inhaler and inhale daily.       No facility-administered encounter medications on file as of 12/28/2012.    Allergies  Allergen Reactions  . Donepezil Diarrhea  . Heparin Rash and Other (See Comments)    Family stated it almost killed him. Hallucinations and itching.  Marland Kitchen Hydralazine Other (See Comments)    DO NOT TAKE PER MD  . Hydromorphone Itching  . Morphine Itching  . Plavix (Clopidogrel Bisulfate) Other (See Comments)    GI BLEEDING; "so bad I had to take blood; dr took me off it"  . Promethazine Other (See Comments)    respiratory failure  . Sulfonamide Derivatives Itching  . Zaroxolyn (Metolazone) Diarrhea  . Cefuroxime  Other (See Comments)    REACTION: unkown reaction  . Metoprolol Other (See Comments)    unknown  . Ertapenem Itching and Rash    12/31/2011 pt does not recall allergy or severity    Past Medical History  Diagnosis Date  . Adenomatous colon polyp   . Diverticulosis   . GERD (gastroesophageal reflux disease)   . Hiatal hernia   . Alzheimer disease   . Gout     "hands; backbone; elbows; shoulders"  . Asthma with bronchitis   . COPD (chronic obstructive pulmonary disease)   . Chronic hypoxemic respiratory failure   . OSA (obstructive sleep apnea)     CPAP  . CHF (congestive heart failure)     grade 2 diastolic dysfunction, EF 65-70% on ECHO 12/2011  . Blood transfusion     "I've had about 6 pints" (12/31/2011)  . Pneumonia     intermittent  . Anemia   . GI bleed     GI Dr. Marina Goodell,   . Hypertension   . Peripheral vascular disease     PAD  . Dysrhythmia     "skips"  . Anginal pain   . Myocardial infarction     "they said I had 2 light ones"  . Shortness of breath 12/31/2011    "all the time"  . Type II diabetes mellitus     with complications:  retinopathy, nephrophathy.   . Arthritis     "plenty"  . Chronic lower back pain   . Renal insufficiency     "went on dialysis probably 3 times" Last 04/2011  . Skin cancer     "forehead; arms"  . Depression   . Diverticulosis   . Colon polyps     adenomatous and hyperplastic  . GERD (gastroesophageal reflux disease)   . Hiatal hernia     ROS: Negative except as per HPI  BP 140/48  Pulse 60  Ht 5\' 8"  (1.727 m)  Wt 111.131 kg (245 lb)  BMI 37.26 kg/m2  SpO2 91%  PHYSICAL EXAM: Pt is alert and oriented, pleasant obese male in NAD HEENT: normal Neck: JVP - normal, carotids 2+= with bilateral bruits Lungs: CTA bilaterally CV: RRR with soft systolic ejection murmur at the right upper sternal border Abd: soft, NT, obese Ext: 1+ pretibial edema bilaterally with chronic stasis dermatitis, palpable but diminished posterior  tibial pulses bilaterally  ASSESSMENT AND PLAN: 1. Coronary artery disease, native vessel and graft disease. The patient is stable without recurrent anginal  symptoms. He had a normal Myoview stress test in May 2014. Continue current medical therapy.  2. Chronic diastolic heart failure. No changes in symptoms. Weight is stable. He is extremely noncompliant with diet but not going to change this. He is maintained on daily furosemide and as needed metolazone. He weighs in regularly and adjust medication based on weight.  3. Chronic kidney disease, stage 3-4. Followup metabolic panel today. Last creatinine was 2.5 mg/dL. He is not on an ACE inhibitor or ARB because of advanced CKD.  4. Hypertension. Blood pressure is controlled on current medical program. He is on amlodipine, bisoprolol, clonidine, and isosorbide.  5. Peripheral arterial disease. No change in symptoms. He can't walk very far. He has no resting ischemic symptoms or ulceration. Will repeat ABIs with lower extremity Doppler studies before his next office visit.  Tonny Bollman 12/28/2012 10:20 AM

## 2013-01-09 ENCOUNTER — Ambulatory Visit: Payer: BC Managed Care – PPO | Admitting: Internal Medicine

## 2013-01-10 ENCOUNTER — Encounter: Payer: Self-pay | Admitting: Neurology

## 2013-01-11 ENCOUNTER — Encounter: Payer: Self-pay | Admitting: Neurology

## 2013-01-11 ENCOUNTER — Ambulatory Visit (INDEPENDENT_AMBULATORY_CARE_PROVIDER_SITE_OTHER): Payer: BC Managed Care – PPO | Admitting: Neurology

## 2013-01-11 VITALS — BP 128/45 | HR 58 | Ht 67.5 in | Wt 246.0 lb

## 2013-01-11 DIAGNOSIS — R413 Other amnesia: Secondary | ICD-10-CM

## 2013-01-11 HISTORY — DX: Other amnesia: R41.3

## 2013-01-11 MED ORDER — MEMANTINE HCL 28 X 5 MG & 21 X 10 MG PO TABS: ORAL_TABLET | ORAL | Status: DC

## 2013-01-11 NOTE — Progress Notes (Signed)
Reason for visit: Memory disturbance  [name] is a 79 y.o. male  History of present illness:  [name] is a 79 year old right-handed white male with a history of a slowly progressive memory problem that dates back to at least 2001. The patient has been followed through this office in the past, last seen around 2008. The patient in the past has been treated with Aricept, Exelon tablets and patches, with poor tolerance. The patient had a Mini-Mental status examination score of 20/30 in 2008. The patient has significant obesity, diabetes, and sleep apnea on CPAP. The patient has had ongoing very slow progression of memory. The patient has not driven a car since 2001. The patient does not do the finances, and his wife handles his medications. The patient last had MRI evaluation in 2008 that showed generalized cortical atrophy with minimal small vessel disease.  The patient does report ongoing fatigue issues, and excessive daytime drowsiness. The patient has some numbness in the feet associated with his diabetic peripheral neuropathy. The patient has imbalance issues, with occasional falls. He does have a cane for ambulation but he does not use this consistently. The patient has difficulty voiding the bladder, and he has chronic renal insufficiency. The patient returns to this office for further evaluation. The patient denies any hallucinations, but occasionally he will sense a "ghost" at nighttime when he is trying to sleep. The wife indicates that he will have hypersexual behavior, exposing himself in public at times. The patient does have intermittent episodes of agitation, threatening to kill other family members. These issues were also reported as well in 2008.   Past Medical History  Diagnosis Date  . Adenomatous colon polyp   . Diverticulosis   . GERD (gastroesophageal reflux disease)   . Hiatal hernia   . Alzheimer disease   . Gout     "hands; backbone; elbows; shoulders"  . Asthma  with bronchitis   . COPD (chronic obstructive pulmonary disease)   . Chronic hypoxemic respiratory failure   . OSA (obstructive sleep apnea)     CPAP  . CHF (congestive heart failure)     grade 2 diastolic dysfunction, EF 65-70% on ECHO 12/2011  . Blood transfusion     "I've had about 6 pints" (12/31/2011)  . Pneumonia     intermittent  . Anemia   . GI bleed     GI Dr. Marina Goodell,   . Hypertension   . Peripheral vascular disease     PAD  . Dysrhythmia     "skips"  . Anginal pain   . Myocardial infarction     "they said I had 2 light ones"  . Shortness of breath 12/31/2011    "all the time"  . Type II diabetes mellitus     with complications:  retinopathy, nephrophathy.   . Arthritis     "plenty"  . Chronic lower back pain   . Renal insufficiency     "went on dialysis probably 3 times" Last 04/2011  . Skin cancer     "forehead; arms"  . Depression   . Diverticulosis   . Colon polyps     adenomatous and hyperplastic  . GERD (gastroesophageal reflux disease)   . Hiatal hernia   . Memory deficits 01/11/2013  . Obesity     Past Surgical History  Procedure Laterality Date  . Hand surgery  1990s    "chain saw accident; had to reattach fingers on left hand"  . Esophagogastroduodenoscopy  05/05/2011  Procedure: ESOPHAGOGASTRODUODENOSCOPY (EGD);  Surgeon: Louis Meckel, MD;  Location: [facility];  Service: Endoscopy;  Laterality: N/A;  . Flexible sigmoidoscopy  05/05/2011    Procedure: FLEXIBLE SIGMOIDOSCOPY;  Surgeon: Louis Meckel, MD;  Location: [facility];  Service: Endoscopy;  Laterality: N/A;  . Laparoscopic cholecystectomy  ~ 2001  . Excisional hemorrhoidectomy  1950's  . Cataract extraction w/ intraocular lens  implant, bilateral    . Coronary artery bypass graft  1997    CABG X2 vessels  . Iliac artery stent  11/2011    left/H&P  . Renal artery stent  2010    left/H&P  . Iliac artery stent  11/2011    right  . Coronary angioplasty with stent placement   12/31/2011    "I have 15 stents; heart, kidney, aorta, legs"  . Colonoscopy w/ biopsies and polypectomy      "have had probably 5 cut out" (12/31/2011)  . Enteroscopy  03/04/2012    Procedure: ENTEROSCOPY;  Surgeon: Louis Meckel, MD;  Location: [facility] END[facility]COPY;  Service: Endoscopy;  Laterality: N/A;  jessica/leone  . Coronary artery bypass graft  1997    Family History  Problem Relation Age of Onset  . Lung cancer Brother   . Diabetes Mother   . Arthritis Mother   . Heart attack Mother   . Heart disease Sister   . Stomach cancer Maternal Grandfather   . Heart disease Father   . Heart attack Father   . Heart disease Brother   . ALS Sister   . ALS      nephew/neice  . Colon cancer Neg Hx     Social history:  reports that he quit smoking about 22 years ago. His smoking use included Cigarettes. He has a 112.5 pack-year smoking history. He quit smokeless tobacco use about 22 years ago. His smokeless tobacco use included Chew. He reports that he does not drink alcohol or use illicit drugs.  Medications:  Current Outpatient Prescriptions on File Prior to Visit  Medication Sig Dispense Refill  . acetaminophen (TYLENOL) 650 MG CR tablet TAKE 1300 MG BY MOUTH 2 TIMES DAILY      . albuterol (PROVENTIL) (2.5 MG/3ML) 0.083% nebulizer solution Take 2.5 mg by nebulization every 6 (six) hours as needed. For shortness of breath      . allopurinol (ZYLOPRIM) 300 MG tablet Take 300 mg by mouth 2 (two) times daily.      Marland Kitchen aspirin EC 325 MG tablet Take 325 mg by mouth daily.      Marland Kitchen atorvastatin (LIPITOR) 20 MG tablet Take 20 mg by mouth at bedtime.      . bisoprolol (ZEBETA) 5 MG tablet Take 1 tablet (5 mg total) by mouth daily.  30 tablet  11  . cloNIDine HCl (KAPVAY) 0.1 MG TB12 ER tablet Take 1 tablet (0.1 mg total) by mouth 2 (two) times daily.  60 tablet  5  . colchicine 0.6 MG tablet Take 0.6 mg by mouth daily.       Marland Kitchen escitalopram (LEXAPRO) 20 MG tablet Take 20 mg by mouth daily.      Marland Kitchen  ezetimibe (ZETIA) 10 MG tablet Take 10 mg by mouth daily.        . folic acid (FOLVITE) 400 MCG tablet Take 400 mcg by mouth daily.        . furosemide (LASIX) 80 MG tablet Take 1 tablet (80 mg total) by mouth 2 (two) times daily.  180 tablet  3  .  gabapentin (NEURONTIN) 100 MG tablet Take 100 mg by mouth at bedtime.        . insulin aspart (NOVOLOG) 100 UNIT/ML injection Inject 20 Units into the skin 3 (three) times daily with meals.      . insulin glargine (LANTUS) 100 UNIT/ML injection Inject 52 Units into the skin 2 (two) times daily.      . isosorbide mononitrate (IMDUR) 30 MG 24 hr tablet Take 60 mg by mouth daily.       . metolazone (ZAROXOLYN) 2.5 MG tablet Take one tablet by mouth 30 minutes prior to your morning Furosemide dose as needed based on weight  1 tablet  0  . Multiple Vitamin (MULTIVITAMIN WITH MINERALS) TABS Take 0.5 tablets by mouth 2 (two) times daily.      . nitroGLYCERIN (NITROSTAT) 0.4 MG SL tablet Place 0.4 mg under the tongue every 5 (five) minutes as needed. May repeat x 3 for chest pain      . pantoprazole (PROTONIX) 40 MG tablet Take 40 mg by mouth daily.        . predniSONE (DELTASONE) 5 MG tablet Take 5 mg by mouth daily.      Marland Kitchen tiotropium (SPIRIVA) 18 MCG inhalation capsule Place 18 mcg into inhaler and inhale daily.      Marland Kitchen amLODipine (NORVASC) 10 MG tablet Take 1 tablet (10 mg total) by mouth daily.  30 tablet  5   No current facility-administered medications on file prior to visit.      Allergies  Allergen Reactions  . Donepezil Diarrhea  . Heparin Rash and Other (See Comments)    Family stated it almost killed him. Hallucinations and itching.  Marland Kitchen Hydralazine Other (See Comments)    DO NOT TAKE PER MD  . Hydromorphone Itching  . Morphine Itching  . Plavix [Clopidogrel Bisulfate] Other (See Comments)    GI BLEEDING; "so bad I had to take blood; dr took me off it"  . Promethazine Other (See Comments)    respiratory failure  . Sulfonamide Derivatives  Itching  . Zaroxolyn [Metolazone] Diarrhea  . Cefuroxime Other (See Comments)    REACTION: unkown reaction  . Metoprolol Other (See Comments)    unknown  . Ertapenem Itching and Rash    12/31/2011 pt does not recall allergy or severity    ROS:  Out of a complete 14 system review of symptoms, the patient complains only of the following symptoms, and all other reviewed systems are negative.  Fatigue  Palpitations of the heart Swelling in the legs Hearing loss Blurred vision, eye pain Shortness of breath, cough, wheezing, snoring Constipation Urination problems Joint pain, achy muscles Memory loss, confusion, weakness Anxiety, difficulty with sleep, decreased energy, snoring  Blood pressure 128/45, pulse 58, height 5' 7.5" (1.715 m), weight 246 lb (111.585 kg).  Physical Exam  General: The patient is alert and cooperative at the time of the examination. The patient is markedly obese.   Head: Pupils are equal, round, and reactive to light. Discs are flat bilaterally.  Neck: The neck is supple, bilateral  carotid bruits are noted.  Respiratory: The respiratory examination is clear.  Cardiovascular: The cardiovascular examination reveals a regular rate and rhythm, a grade 2/6 systolic ejection murmur is noted in the aortic area.   Skin: Extremities are with 2+ edema below the knees bilaterally.   Neurologic Exam  Mental status: Mini-Mental status examination done today shows a total score of 18/30.   Cranial nerves: Facial symmetry is present. There  is good sensation of the face to pinprick and soft touch bilaterally. The strength of the facial muscles and the muscles to head turning and shoulder shrug are normal bilaterally. Speech is well enunciated, no aphasia or dysarthria is noted. Extraocular movements are full. Visual fields are full.  Motor: The motor testing reveals 5 over 5 strength of all 4 extremities. Good symmetric motor tone is noted throughout.  Sensory:  Sensory testing is intact to pinprick, soft touch, vibration sensation, and position sense on all 4 extremities, with the exception of a stocking pattern pinprick sensory deficit up to the knees bilaterally. No evidence of extinction is noted.  Coordination: Cerebellar testing reveals good finger-nose-finger and heel-to-shin bilaterally.  Gait and station: Gait is slightly wide-based. Tandem gait is unsteady. Romberg is negative. No drift is seen.  Reflexes: Deep tendon reflexes are symmetric, but are depressed bilaterally. Toes are downgoing bilaterally.   Assessment/Plan:  1. Slowly progressive dementing illness  2. Diabetes  3. Sleep apnea on CPAP  4. Chronic renal insufficiency  5. Bilateral carotid bruits  The patient has undergone dementia workups on several occasions in the past. He has had a remarkably slow aggression of his memory issues. The patient currently is scoring 18/30 on the Mini-Mental status examination, and he scored 20/30 in 2008. The patient will be given a trial on Namenda. The patient has multiple medical issues. I see no indication for a repeat MRI the brain. The wife indicates that Dr. Nanetta Batty has followed the carotid doppler studies for the bilateral carotid bruits. The patient will followup in 6 months. A prescription was given for a Namenda Dosepak, and the wife will contact me if he is tolerating the medication to start the maintenance therapy.  Marlan Palau MD 01/11/2013 8:36 PM  [facility] [address] [place], [place] [address]  Phone [phone] Fax [phone]

## 2013-01-20 ENCOUNTER — Ambulatory Visit: Payer: BC Managed Care – PPO | Admitting: Internal Medicine

## 2013-02-04 IMAGING — CR DG CHEST 2V
2 series · 2 of 2 positions shown · non-contrast
Comparison: 04/01/2012

CLINICAL DATA: Left base rales.  Fever.

CHEST - 2 VIEW

[w chest pa]
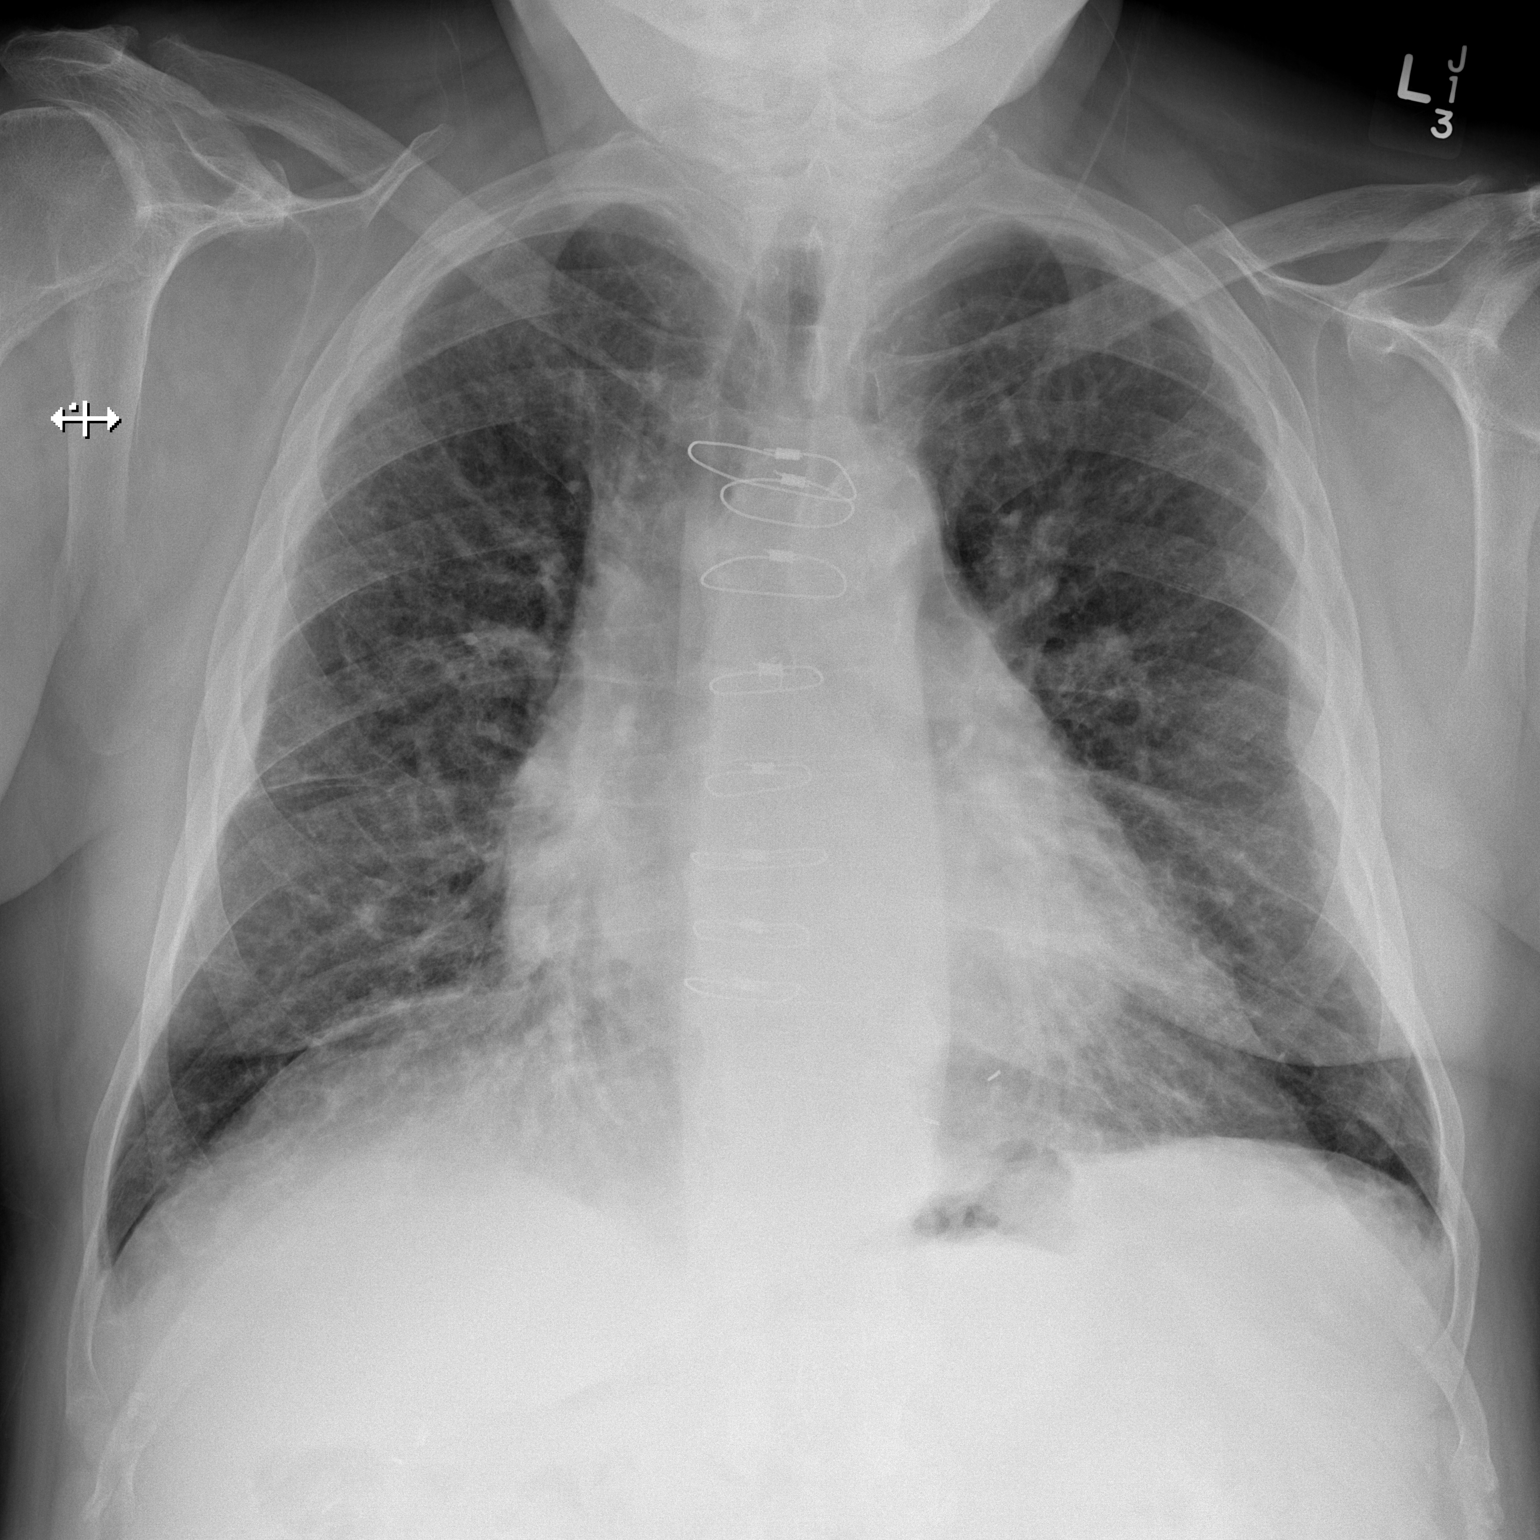

[w chest lat]
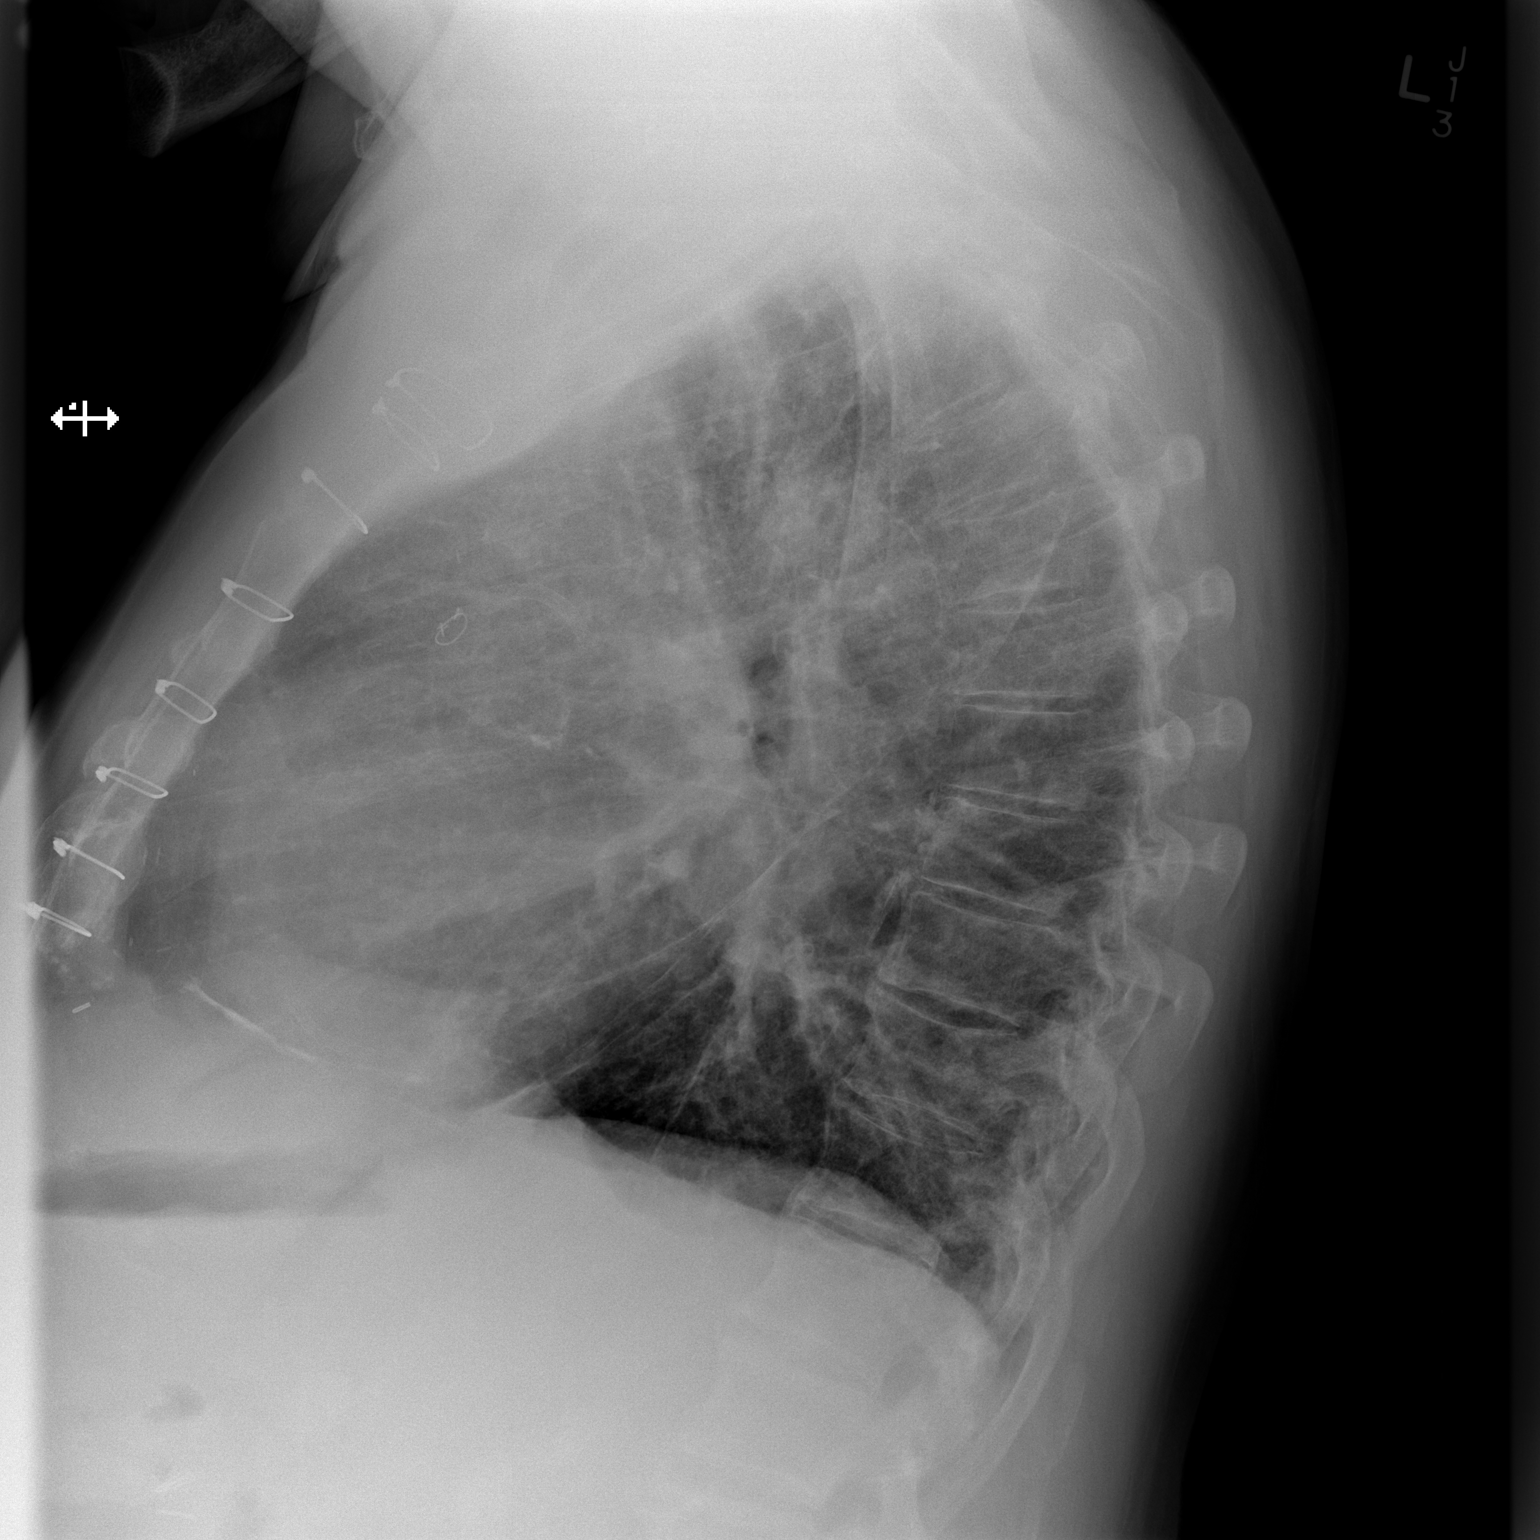

[2 of 2 positions shown; findings below may reference images not displayed]

FINDINGS: Previous median sternotomy.  Stable mild cardiomegaly.
Mild diffuse interstitial edema with septal lines seen peripherally
in the lung bases, new since previous exam. No focal airspace
consolidation.  Mild central pulmonary vascular congestion as
before.  No effusion.
IMPRESSION: 1.  Stable cardiomegaly with mild interstitial edema bilaterally.

## 2013-02-05 IMAGING — CT CT CHEST W/O CM
3 series · 16 of 35 positions shown, 19 images · non-contrast
Comparison: 06/06/2010

CLINICAL DATA: Chronic dyspnea, cough and history of pulmonary
fibrosis

CT CHEST WITHOUT CONTRAST
TECHNIQUE: Multidetector CT imaging of the chest was performed
following the standard protocol without IV contrast.

[Series 2: routine chest · axial · 0.83mm/px · z∈[-340,-45]mm · 10 of 75 slices shown, 13 images]
[im 8/75  mediastinal]
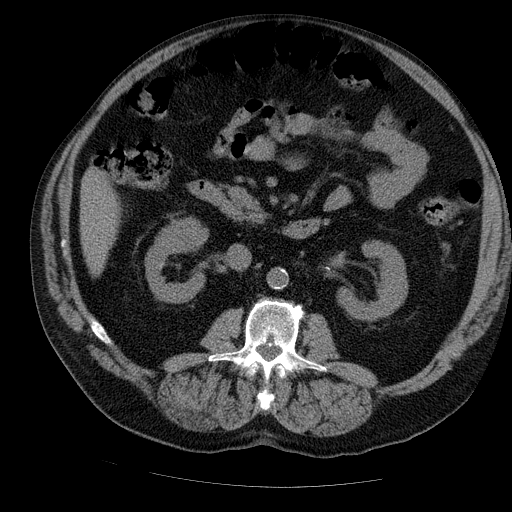
[im 8/75  lung]
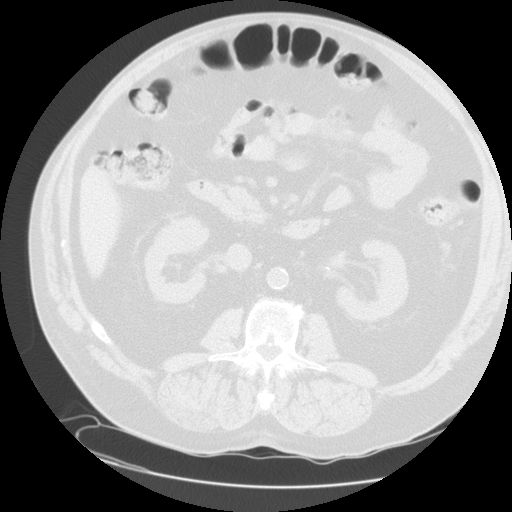
[im 15/75  lung]
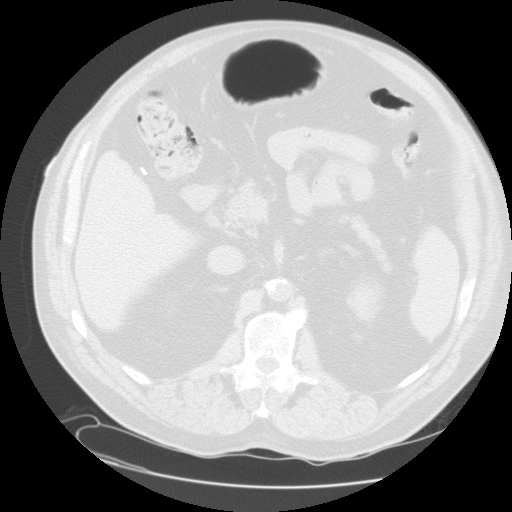
[im 23/75  lung]
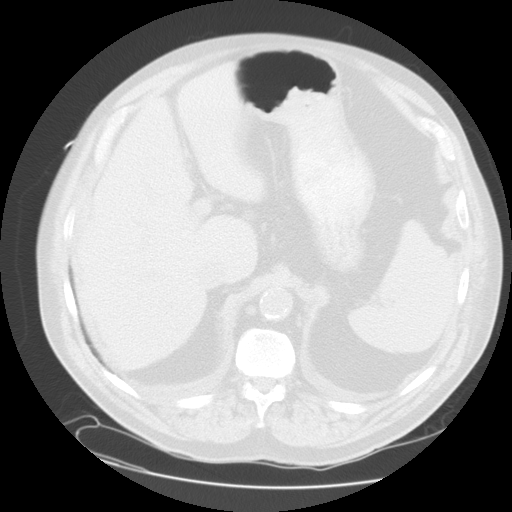
[im 30/75  lung]
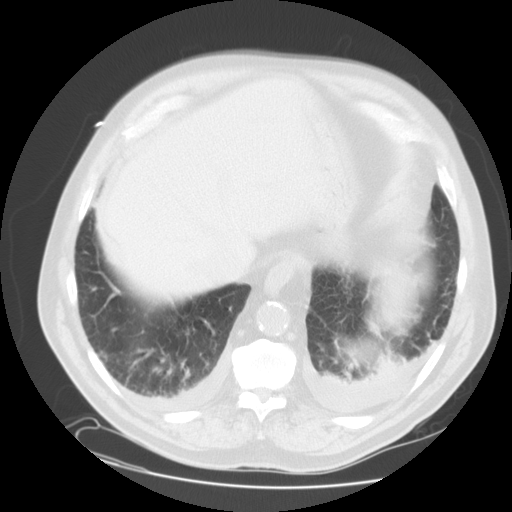
[im 38/75  mediastinal]
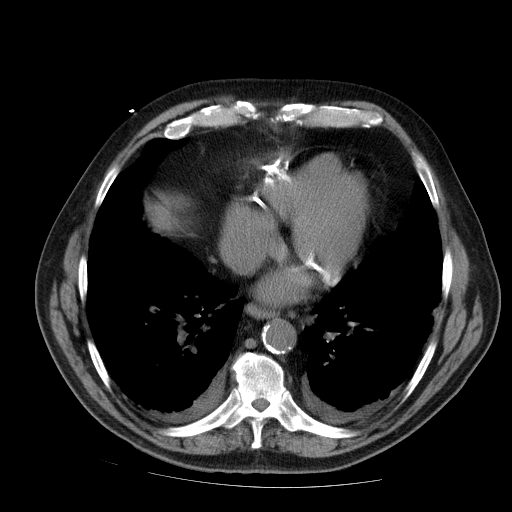
[im 38/75  lung]
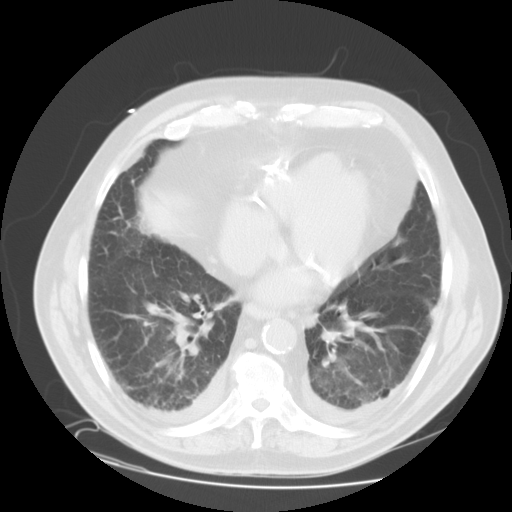
[im 42/75  lung]
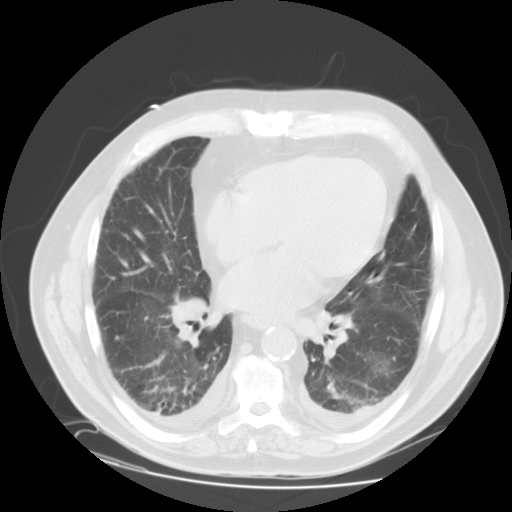
[im 45/75  lung]
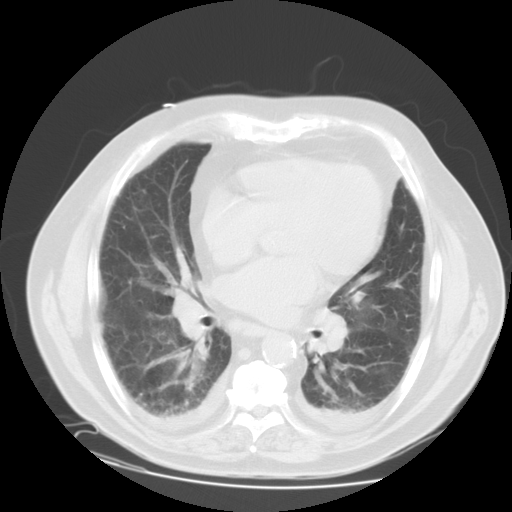
[im 52/75  lung]
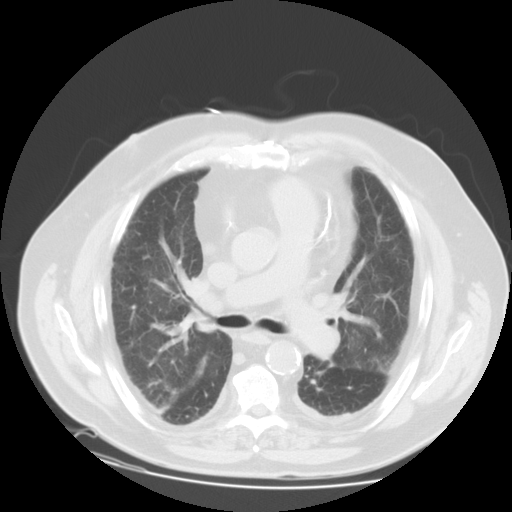
[im 60/75  mediastinal]
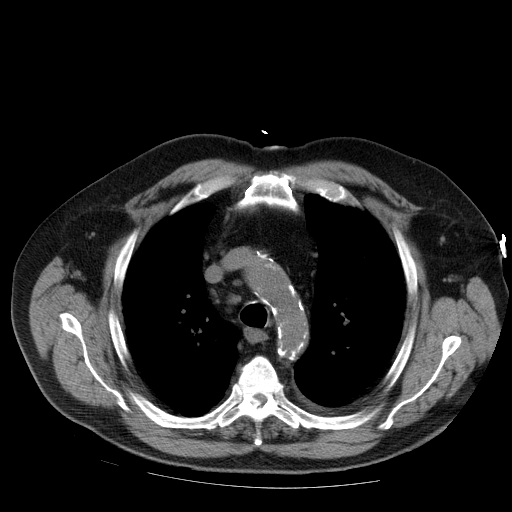
[im 60/75  lung]
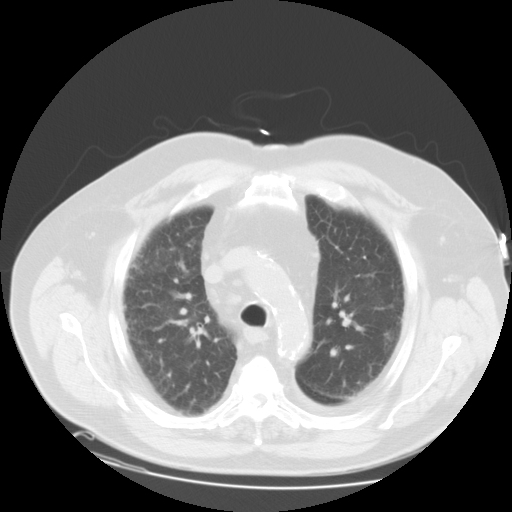
[im 67/75  lung]
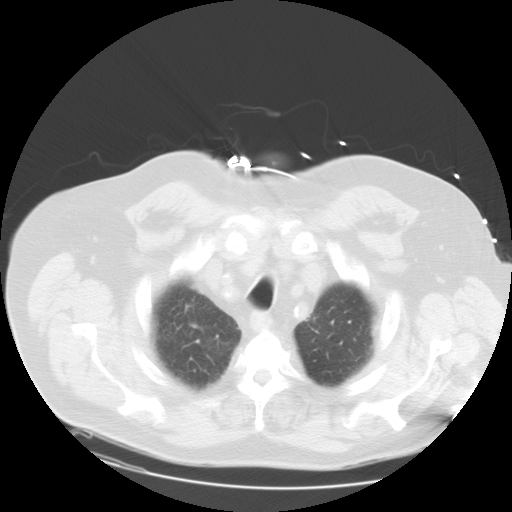

[Series 400: cor · coronal · 0.83mm/px · 3 of 97 slices shown]
[im 20/97  lung]
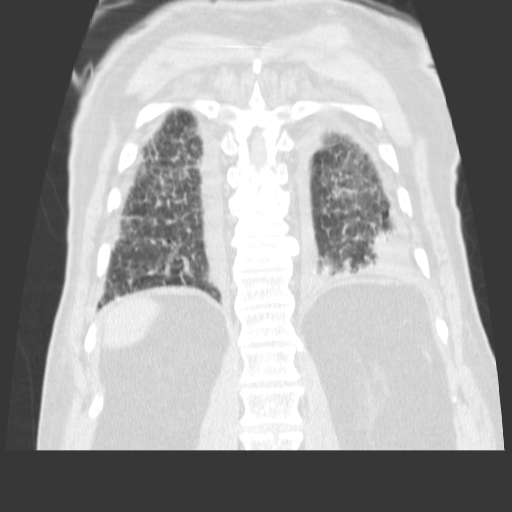
[im 39/97  lung]
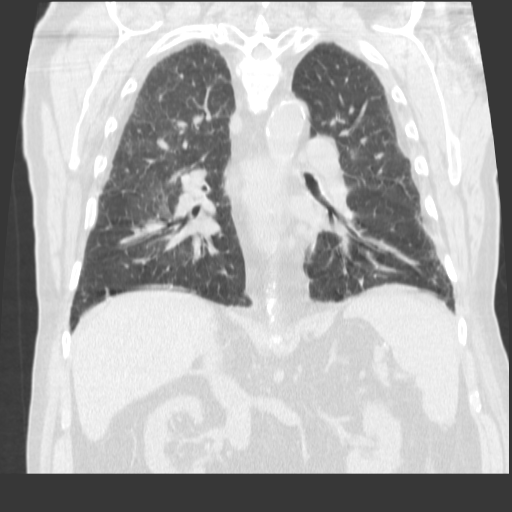
[im 58/97  lung]
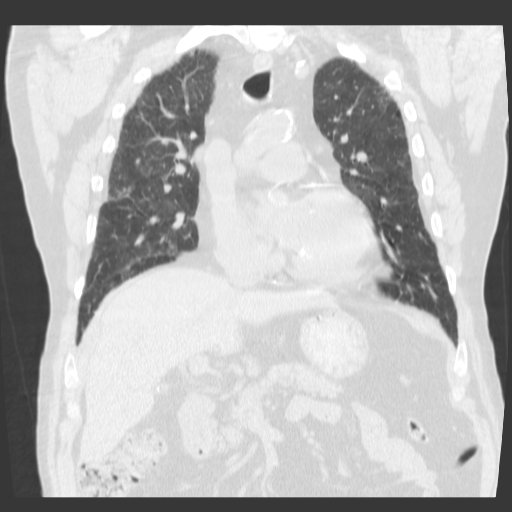

[Series 401: sag · sagittal · 0.83mm/px · 3 of 123 slices shown]
[im 8/123  lung]
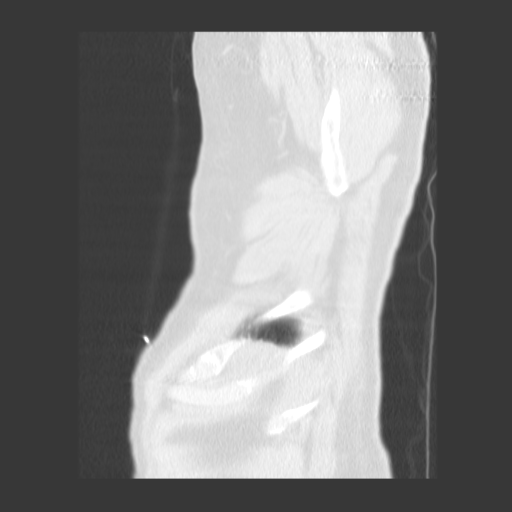
[im 23/123  lung]
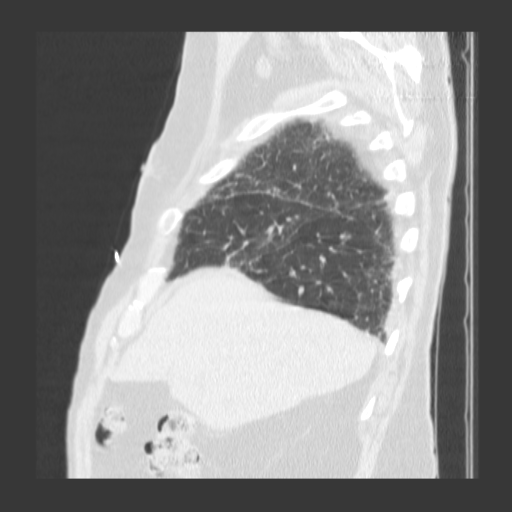
[im 39/123  lung]
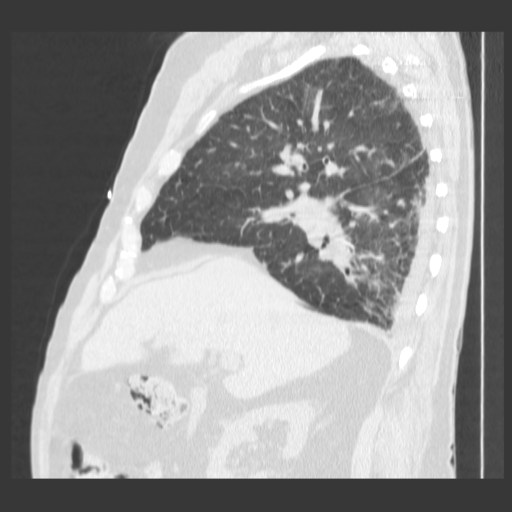

[16 of 35 positions shown; findings below may reference images not displayed]

FINDINGS: Since the prior study, interstitial thickening has
increased, vertically in the lower lungs.  There are now small
bilateral effusions.  A small nodule noted in the right lower lobe
previously is partly obscured by contiguous interstitial thickening
but is otherwise unchanged.

The heart is normal in size.  There are changes from previous CABG
surgery and there are prominent coronary artery calcifications.
There are prompt to borderline enlarged mediastinal lymph nodes.
Reference measurements were made of three.  There is a prevascular
node anterior to the lower superior vena cava that measures 14 mm
in short axis.  There is a right paratracheal node measuring
mm in short axis and an azygos region right paratracheal node
measuring 10.8 mm short axis.

When compared the prior study, the mediastinal adenopathy has
increased.

Limited evaluation of the upper abdomen shows low density left
renal lesions are likely cysts.  These are not included on the
field of view from the previous study.  There is been a
cholecystectomy.

There are degenerative changes of the visualized spine and changes
from the median sternotomy.  No osteoblastic or osteolytic lesions.
IMPRESSION: Increased interstitial thickening along with the small effusions is
most suggestive of pulmonary edema superimposed on chronic
interstitial scarring.  The presence of mildly enlarged mediastinal
lymph nodes, new from prior study is nonspecific.  These may be
inflammatory or reactive to infection and thus the possibility of a
more diffuse interstitial infectious infiltrate should be
considered.  Pulmonary edema is favored.

## 2013-02-08 ENCOUNTER — Telehealth: Payer: Self-pay | Admitting: Neurology

## 2013-02-08 DIAGNOSIS — R413 Other amnesia: Secondary | ICD-10-CM

## 2013-02-08 MED ORDER — MEMANTINE HCL 10 MG PO TABS: 10.0000 mg | ORAL_TABLET | Freq: Two times a day (BID) | ORAL | Status: DC

## 2013-02-08 NOTE — Telephone Encounter (Signed)
I spoke with Aundra Millet at the pharmacy.  She said the patient completed their starter pack, but there were refills on it, which confused them.  Told her I will send a new Rx for current dose of 10mg  BID.

## 2013-02-22 DIAGNOSIS — K5520 Angiodysplasia of colon without hemorrhage: Secondary | ICD-10-CM

## 2013-02-22 HISTORY — DX: Angiodysplasia of colon without hemorrhage: K55.20

## 2013-02-27 ENCOUNTER — Other Ambulatory Visit: Payer: Self-pay | Admitting: Family Medicine

## 2013-02-27 ENCOUNTER — Ambulatory Visit
Admission: RE | Admit: 2013-02-27 | Discharge: 2013-02-27 | Disposition: A | Discharge status: Home / Self Care | Payer: BC Managed Care – PPO | Source: Ambulatory Visit | Attending: Family Medicine | Admitting: Family Medicine

## 2013-02-27 DIAGNOSIS — R0602 Shortness of breath: Secondary | ICD-10-CM

## 2013-03-01 ENCOUNTER — Other Ambulatory Visit: Payer: Self-pay | Admitting: *Deleted

## 2013-03-01 NOTE — Progress Notes (Signed)
Labs received from Dr. Jeannetta Nap office.  Hgb 8.2.  Called office to ask if pt needs to be seen here.  We saw him once about a year ago.  They said pt does not need appt,  The lab was sent as FYI only and they will call if pt needs appt w/ Hematologist again.

## 2013-03-02 ENCOUNTER — Telehealth: Payer: Self-pay | Admitting: *Deleted

## 2013-03-02 DIAGNOSIS — D649 Anemia, unspecified: Secondary | ICD-10-CM

## 2013-03-02 NOTE — Telephone Encounter (Signed)
Dr. Hennie Duos office requests Hematology appt asap.  Hgb 8.2 and Ferritn 21.   POF sent to schedule lab/office visit on 10/14 per Dr. Bertis Ruddy.  Left VM for wife on cell phone asking her to call back to confirm date and time of appt.Marland Kitchen

## 2013-03-03 ENCOUNTER — Inpatient Hospital Stay (HOSPITAL_COMMUNITY)
Admission: EM | Admit: 2013-03-03 | Discharge: 2013-03-06 | DRG: 552 | Disposition: A | Discharge status: Home / Self Care | Payer: BC Managed Care – PPO | Attending: Internal Medicine | Admitting: Internal Medicine

## 2013-03-03 ENCOUNTER — Encounter (HOSPITAL_COMMUNITY): Payer: Self-pay | Admitting: Emergency Medicine

## 2013-03-03 ENCOUNTER — Telehealth: Payer: Self-pay | Admitting: Hematology and Oncology

## 2013-03-03 ENCOUNTER — Emergency Department (HOSPITAL_COMMUNITY): Payer: BC Managed Care – PPO

## 2013-03-03 ENCOUNTER — Telehealth: Payer: Self-pay | Admitting: *Deleted

## 2013-03-03 DIAGNOSIS — I200 Unstable angina: Secondary | ICD-10-CM

## 2013-03-03 DIAGNOSIS — I491 Atrial premature depolarization: Secondary | ICD-10-CM | POA: Diagnosis present

## 2013-03-03 DIAGNOSIS — IMO0002 Reserved for concepts with insufficient information to code with codable children: Secondary | ICD-10-CM

## 2013-03-03 DIAGNOSIS — G4733 Obstructive sleep apnea (adult) (pediatric): Secondary | ICD-10-CM | POA: Diagnosis present

## 2013-03-03 DIAGNOSIS — I2510 Atherosclerotic heart disease of native coronary artery without angina pectoris: Secondary | ICD-10-CM | POA: Diagnosis present

## 2013-03-03 DIAGNOSIS — Z860100 Personal history of colon polyps, unspecified: Secondary | ICD-10-CM | POA: Diagnosis present

## 2013-03-03 DIAGNOSIS — N179 Acute kidney failure, unspecified: Secondary | ICD-10-CM | POA: Diagnosis present

## 2013-03-03 DIAGNOSIS — N2889 Other specified disorders of kidney and ureter: Secondary | ICD-10-CM | POA: Diagnosis present

## 2013-03-03 DIAGNOSIS — I129 Hypertensive chronic kidney disease with stage 1 through stage 4 chronic kidney disease, or unspecified chronic kidney disease: Secondary | ICD-10-CM | POA: Diagnosis present

## 2013-03-03 DIAGNOSIS — I519 Heart disease, unspecified: Secondary | ICD-10-CM | POA: Diagnosis present

## 2013-03-03 DIAGNOSIS — G309 Alzheimer's disease, unspecified: Secondary | ICD-10-CM | POA: Diagnosis present

## 2013-03-03 DIAGNOSIS — K449 Diaphragmatic hernia without obstruction or gangrene: Secondary | ICD-10-CM | POA: Diagnosis present

## 2013-03-03 DIAGNOSIS — F3289 Other specified depressive episodes: Secondary | ICD-10-CM | POA: Diagnosis present

## 2013-03-03 DIAGNOSIS — K922 Gastrointestinal hemorrhage, unspecified: Secondary | ICD-10-CM | POA: Diagnosis present

## 2013-03-03 DIAGNOSIS — R0602 Shortness of breath: Secondary | ICD-10-CM | POA: Diagnosis present

## 2013-03-03 DIAGNOSIS — I5032 Chronic diastolic (congestive) heart failure: Secondary | ICD-10-CM | POA: Diagnosis present

## 2013-03-03 DIAGNOSIS — Z87891 Personal history of nicotine dependence: Secondary | ICD-10-CM

## 2013-03-03 DIAGNOSIS — J209 Acute bronchitis, unspecified: Secondary | ICD-10-CM

## 2013-03-03 DIAGNOSIS — Z7982 Long term (current) use of aspirin: Secondary | ICD-10-CM

## 2013-03-03 DIAGNOSIS — F329 Major depressive disorder, single episode, unspecified: Secondary | ICD-10-CM | POA: Diagnosis present

## 2013-03-03 DIAGNOSIS — M109 Gout, unspecified: Secondary | ICD-10-CM | POA: Diagnosis present

## 2013-03-03 DIAGNOSIS — I5033 Acute on chronic diastolic (congestive) heart failure: Secondary | ICD-10-CM

## 2013-03-03 DIAGNOSIS — Z79899 Other long term (current) drug therapy: Secondary | ICD-10-CM

## 2013-03-03 DIAGNOSIS — F0390 Unspecified dementia without behavioral disturbance: Secondary | ICD-10-CM | POA: Diagnosis present

## 2013-03-03 DIAGNOSIS — K5520 Angiodysplasia of colon without hemorrhage: Principal | ICD-10-CM | POA: Diagnosis present

## 2013-03-03 DIAGNOSIS — J4489 Other specified chronic obstructive pulmonary disease: Secondary | ICD-10-CM | POA: Diagnosis present

## 2013-03-03 DIAGNOSIS — I739 Peripheral vascular disease, unspecified: Secondary | ICD-10-CM | POA: Diagnosis present

## 2013-03-03 DIAGNOSIS — K5521 Angiodysplasia of colon with hemorrhage: Secondary | ICD-10-CM

## 2013-03-03 DIAGNOSIS — Z9119 Patient's noncompliance with other medical treatment and regimen: Secondary | ICD-10-CM | POA: Diagnosis present

## 2013-03-03 DIAGNOSIS — J449 Chronic obstructive pulmonary disease, unspecified: Secondary | ICD-10-CM | POA: Diagnosis present

## 2013-03-03 DIAGNOSIS — D131 Benign neoplasm of stomach: Secondary | ICD-10-CM

## 2013-03-03 DIAGNOSIS — E119 Type 2 diabetes mellitus without complications: Secondary | ICD-10-CM | POA: Diagnosis present

## 2013-03-03 DIAGNOSIS — I480 Paroxysmal atrial fibrillation: Secondary | ICD-10-CM | POA: Diagnosis present

## 2013-03-03 DIAGNOSIS — F0280 Dementia in other diseases classified elsewhere without behavioral disturbance: Secondary | ICD-10-CM | POA: Diagnosis present

## 2013-03-03 DIAGNOSIS — E1139 Type 2 diabetes mellitus with other diabetic ophthalmic complication: Secondary | ICD-10-CM | POA: Diagnosis present

## 2013-03-03 DIAGNOSIS — E11319 Type 2 diabetes mellitus with unspecified diabetic retinopathy without macular edema: Secondary | ICD-10-CM | POA: Diagnosis present

## 2013-03-03 DIAGNOSIS — Z91199 Patient's noncompliance with other medical treatment and regimen due to unspecified reason: Secondary | ICD-10-CM | POA: Diagnosis present

## 2013-03-03 DIAGNOSIS — E785 Hyperlipidemia, unspecified: Secondary | ICD-10-CM

## 2013-03-03 DIAGNOSIS — E1129 Type 2 diabetes mellitus with other diabetic kidney complication: Secondary | ICD-10-CM | POA: Diagnosis present

## 2013-03-03 DIAGNOSIS — K209 Esophagitis, unspecified without bleeding: Secondary | ICD-10-CM | POA: Diagnosis present

## 2013-03-03 DIAGNOSIS — D649 Anemia, unspecified: Secondary | ICD-10-CM | POA: Diagnosis present

## 2013-03-03 DIAGNOSIS — D500 Iron deficiency anemia secondary to blood loss (chronic): Secondary | ICD-10-CM

## 2013-03-03 DIAGNOSIS — I4891 Unspecified atrial fibrillation: Secondary | ICD-10-CM | POA: Diagnosis present

## 2013-03-03 DIAGNOSIS — E669 Obesity, unspecified: Secondary | ICD-10-CM

## 2013-03-03 DIAGNOSIS — Z6836 Body mass index (BMI) 36.0-36.9, adult: Secondary | ICD-10-CM

## 2013-03-03 DIAGNOSIS — I509 Heart failure, unspecified: Secondary | ICD-10-CM | POA: Diagnosis present

## 2013-03-03 DIAGNOSIS — Z9861 Coronary angioplasty status: Secondary | ICD-10-CM | POA: Diagnosis present

## 2013-03-03 DIAGNOSIS — I10 Essential (primary) hypertension: Secondary | ICD-10-CM | POA: Diagnosis present

## 2013-03-03 DIAGNOSIS — N058 Unspecified nephritic syndrome with other morphologic changes: Secondary | ICD-10-CM | POA: Diagnosis present

## 2013-03-03 DIAGNOSIS — Z85828 Personal history of other malignant neoplasm of skin: Secondary | ICD-10-CM | POA: Diagnosis present

## 2013-03-03 DIAGNOSIS — K648 Other hemorrhoids: Secondary | ICD-10-CM

## 2013-03-03 DIAGNOSIS — Z951 Presence of aortocoronary bypass graft: Secondary | ICD-10-CM

## 2013-03-03 DIAGNOSIS — K31811 Angiodysplasia of stomach and duodenum with bleeding: Secondary | ICD-10-CM

## 2013-03-03 DIAGNOSIS — R413 Other amnesia: Secondary | ICD-10-CM

## 2013-03-03 DIAGNOSIS — K219 Gastro-esophageal reflux disease without esophagitis: Secondary | ICD-10-CM | POA: Diagnosis present

## 2013-03-03 DIAGNOSIS — K5730 Diverticulosis of large intestine without perforation or abscess without bleeding: Secondary | ICD-10-CM

## 2013-03-03 DIAGNOSIS — N184 Chronic kidney disease, stage 4 (severe): Secondary | ICD-10-CM | POA: Diagnosis present

## 2013-03-03 DIAGNOSIS — Z794 Long term (current) use of insulin: Secondary | ICD-10-CM

## 2013-03-03 DIAGNOSIS — D126 Benign neoplasm of colon, unspecified: Secondary | ICD-10-CM

## 2013-03-03 DIAGNOSIS — D62 Acute posthemorrhagic anemia: Secondary | ICD-10-CM

## 2013-03-03 DIAGNOSIS — J9610 Chronic respiratory failure, unspecified whether with hypoxia or hypercapnia: Secondary | ICD-10-CM | POA: Diagnosis present

## 2013-03-03 DIAGNOSIS — I252 Old myocardial infarction: Secondary | ICD-10-CM | POA: Diagnosis present

## 2013-03-03 DIAGNOSIS — R079 Chest pain, unspecified: Secondary | ICD-10-CM

## 2013-03-03 LAB — POCT I-STAT, CHEM 8
BUN: 82 mg/dL — ABNORMAL HIGH (ref 6–23)
Calcium, Ion: 1.17 mmol/L (ref 1.13–1.30)
Chloride: 102 mEq/L (ref 96–112)
HCT: 23 % — ABNORMAL LOW (ref 39.0–52.0)
Potassium: 5.8 mEq/L — ABNORMAL HIGH (ref 3.5–5.1)
Sodium: 139 mEq/L (ref 135–145)

## 2013-03-03 LAB — POCT I-STAT TROPONIN I: Troponin i, poc: 0 ng/mL (ref 0.00–0.08)

## 2013-03-03 LAB — BASIC METABOLIC PANEL
BUN: 56 mg/dL — ABNORMAL HIGH (ref 6–23)
CO2: 26 mEq/L (ref 19–32)
Chloride: 99 mEq/L (ref 96–112)
GFR calc Af Amer: 36 mL/min — ABNORMAL LOW (ref >90)
Glucose, Bld: 279 mg/dL — ABNORMAL HIGH (ref 70–99)
Potassium: 4.6 mEq/L (ref 3.5–5.1)
Sodium: 138 mEq/L (ref 135–145)

## 2013-03-03 LAB — GLUCOSE, CAPILLARY: Glucose-Capillary: 141 mg/dL — ABNORMAL HIGH (ref 70–99)

## 2013-03-03 LAB — OCCULT BLOOD, POC DEVICE: Fecal Occult Bld: POSITIVE — AB

## 2013-03-03 LAB — PRO B NATRIURETIC PEPTIDE: Pro B Natriuretic peptide (BNP): 820.2 pg/mL — ABNORMAL HIGH (ref 0–450)

## 2013-03-03 MED ORDER — PREDNISONE 5 MG PO TABS
5.0000 mg | ORAL_TABLET | Freq: Every day | ORAL | Status: DC
  Administered 2013-03-03 – 2013-03-06 (×4): 5 mg via ORAL
  Filled 2013-03-03 (×4): qty 1

## 2013-03-03 MED ORDER — TIOTROPIUM BROMIDE MONOHYDRATE 18 MCG IN CAPS
18.0000 ug | ORAL_CAPSULE | Freq: Every day | RESPIRATORY_TRACT | Status: DC
  Administered 2013-03-04 – 2013-03-06 (×3): 18 ug via RESPIRATORY_TRACT
  Filled 2013-03-03: qty 5

## 2013-03-03 MED ORDER — SODIUM CHLORIDE 0.9 % IV SOLN
500.0000 mL | Freq: Once | INTRAVENOUS | Status: AC
  Administered 2013-03-03: 500 mL via INTRAVENOUS

## 2013-03-03 MED ORDER — BISOPROLOL FUMARATE 5 MG PO TABS
5.0000 mg | ORAL_TABLET | Freq: Every day | ORAL | Status: DC
Start: 2013-03-04 — End: 2013-03-06
  Administered 2013-03-04 – 2013-03-06 (×3): 5 mg via ORAL
  Filled 2013-03-03 (×3): qty 1

## 2013-03-03 MED ORDER — CLONIDINE HCL ER 0.1 MG PO TB12
0.1000 mg | ORAL_TABLET | Freq: Two times a day (BID) | ORAL | Status: DC
  Administered 2013-03-04 – 2013-03-06 (×5): 0.1 mg via ORAL
  Filled 2013-03-03 (×10): qty 1

## 2013-03-03 MED ORDER — ALLOPURINOL 300 MG PO TABS
300.0000 mg | ORAL_TABLET | Freq: Two times a day (BID) | ORAL | Status: DC
  Administered 2013-03-03 – 2013-03-06 (×6): 300 mg via ORAL
  Filled 2013-03-03 (×7): qty 1

## 2013-03-03 MED ORDER — INSULIN GLARGINE 100 UNIT/ML ~~LOC~~ SOLN
30.0000 [IU] | Freq: Two times a day (BID) | SUBCUTANEOUS | Status: DC
  Administered 2013-03-03 – 2013-03-06 (×6): 30 [IU] via SUBCUTANEOUS
  Filled 2013-03-03 (×7): qty 0.3

## 2013-03-03 MED ORDER — FOLIC ACID 0.5 MG HALF TAB
500.0000 ug | ORAL_TABLET | Freq: Every day | ORAL | Status: DC
  Administered 2013-03-04 – 2013-03-06 (×3): 0.5 mg via ORAL
  Filled 2013-03-03 (×3): qty 1

## 2013-03-03 MED ORDER — ALBUTEROL SULFATE (5 MG/ML) 0.5% IN NEBU: 2.5000 mg | INHALATION_SOLUTION | RESPIRATORY_TRACT | Status: DC | PRN

## 2013-03-03 MED ORDER — FUROSEMIDE 10 MG/ML IJ SOLN
20.0000 mg | Freq: Once | INTRAMUSCULAR | Status: AC
  Administered 2013-03-03: 20 mg via INTRAVENOUS
  Filled 2013-03-03: qty 2

## 2013-03-03 MED ORDER — INSULIN ASPART 100 UNIT/ML ~~LOC~~ SOLN
0.0000 [IU] | SUBCUTANEOUS | Status: DC
  Administered 2013-03-03 – 2013-03-04 (×2): 1 [IU] via SUBCUTANEOUS
  Administered 2013-03-04 (×2): 2 [IU] via SUBCUTANEOUS
  Administered 2013-03-04 (×2): 3 [IU] via SUBCUTANEOUS
  Administered 2013-03-05 (×2): 2 [IU] via SUBCUTANEOUS

## 2013-03-03 MED ORDER — ACETAMINOPHEN 325 MG PO TABS: 650.0000 mg | ORAL_TABLET | Freq: Four times a day (QID) | ORAL | Status: DC | PRN

## 2013-03-03 MED ORDER — EZETIMIBE 10 MG PO TABS
10.0000 mg | ORAL_TABLET | Freq: Every day | ORAL | Status: DC
  Administered 2013-03-04 – 2013-03-06 (×3): 10 mg via ORAL
  Filled 2013-03-03 (×3): qty 1

## 2013-03-03 MED ORDER — GABAPENTIN 100 MG PO CAPS
100.0000 mg | ORAL_CAPSULE | Freq: Every day | ORAL | Status: DC
  Administered 2013-03-03 – 2013-03-05 (×4): 100 mg via ORAL
  Filled 2013-03-03 (×5): qty 1

## 2013-03-03 MED ORDER — AMLODIPINE BESYLATE 10 MG PO TABS
10.0000 mg | ORAL_TABLET | Freq: Every day | ORAL | Status: DC
  Administered 2013-03-04 – 2013-03-06 (×3): 10 mg via ORAL
  Filled 2013-03-03 (×3): qty 1

## 2013-03-03 MED ORDER — COLCHICINE 0.6 MG PO TABS
0.6000 mg | ORAL_TABLET | Freq: Every day | ORAL | Status: DC
  Administered 2013-03-04 – 2013-03-06 (×3): 0.6 mg via ORAL
  Filled 2013-03-03 (×3): qty 1

## 2013-03-03 MED ORDER — ESCITALOPRAM OXALATE 20 MG PO TABS
20.0000 mg | ORAL_TABLET | Freq: Every day | ORAL | Status: DC
  Administered 2013-03-04 – 2013-03-06 (×3): 20 mg via ORAL
  Filled 2013-03-03 (×3): qty 1

## 2013-03-03 MED ORDER — ISOSORBIDE MONONITRATE ER 60 MG PO TB24
60.0000 mg | ORAL_TABLET | Freq: Every day | ORAL | Status: DC
  Administered 2013-03-04 – 2013-03-06 (×3): 60 mg via ORAL
  Filled 2013-03-03 (×3): qty 1

## 2013-03-03 MED ORDER — SENNA 8.6 MG PO TABS
1.0000 | ORAL_TABLET | Freq: Two times a day (BID) | ORAL | Status: DC
  Administered 2013-03-03 – 2013-03-06 (×5): 8.6 mg via ORAL
  Filled 2013-03-03 (×7): qty 1

## 2013-03-03 MED ORDER — SODIUM CHLORIDE 0.9 % IV SOLN
8.0000 mg/h | INTRAVENOUS | Status: DC
  Administered 2013-03-03 – 2013-03-04 (×3): 8 mg/h via INTRAVENOUS
  Filled 2013-03-03 (×11): qty 80

## 2013-03-03 MED ORDER — ACETAMINOPHEN 650 MG RE SUPP: 650.0000 mg | Freq: Four times a day (QID) | RECTAL | Status: DC | PRN

## 2013-03-03 MED ORDER — ONDANSETRON HCL 4 MG PO TABS: 4.0000 mg | ORAL_TABLET | Freq: Four times a day (QID) | ORAL | Status: DC | PRN

## 2013-03-03 MED ORDER — ATORVASTATIN CALCIUM 20 MG PO TABS
20.0000 mg | ORAL_TABLET | Freq: Every day | ORAL | Status: DC
  Administered 2013-03-03 – 2013-03-05 (×3): 20 mg via ORAL
  Filled 2013-03-03 (×4): qty 1

## 2013-03-03 MED ORDER — SODIUM CHLORIDE 0.9 % IJ SOLN
3.0000 mL | Freq: Two times a day (BID) | INTRAMUSCULAR | Status: DC
  Administered 2013-03-03 – 2013-03-05 (×4): 3 mL via INTRAVENOUS

## 2013-03-03 MED ORDER — SODIUM CHLORIDE 0.9 % IV SOLN
80.0000 mg | INTRAVENOUS | Status: AC
  Administered 2013-03-03: 80 mg via INTRAVENOUS
  Filled 2013-03-03: qty 80

## 2013-03-03 MED ORDER — FUROSEMIDE 80 MG PO TABS
80.0000 mg | ORAL_TABLET | Freq: Two times a day (BID) | ORAL | Status: DC
  Administered 2013-03-04 – 2013-03-06 (×5): 80 mg via ORAL
  Filled 2013-03-03 (×7): qty 1

## 2013-03-03 MED ORDER — PANTOPRAZOLE SODIUM 40 MG IV SOLR: 40.0000 mg | Freq: Two times a day (BID) | INTRAVENOUS | Status: DC

## 2013-03-03 MED ORDER — DOCUSATE SODIUM 100 MG PO CAPS
100.0000 mg | ORAL_CAPSULE | Freq: Two times a day (BID) | ORAL | Status: DC
  Administered 2013-03-03 – 2013-03-06 (×5): 100 mg via ORAL
  Filled 2013-03-03 (×7): qty 1

## 2013-03-03 MED ORDER — ONDANSETRON HCL 4 MG/2ML IJ SOLN: 4.0000 mg | Freq: Four times a day (QID) | INTRAMUSCULAR | Status: DC | PRN

## 2013-03-03 NOTE — ED Provider Notes (Signed)
CSN: [id number]     Arrival date & time 03/03/13  1731 History   First MD Initiated Contact with Patient 03/03/13 1752     Chief Complaint  Patient presents with  . Chest Pain   (Consider location/radiation/quality/duration/timing/severity/associated sxs/prior Treatment) HPI Comments: 79 y/o male with history of CAD, CKD, COPD, CHF, h/o GI bleed 2/2 AVM presenting with left chest pain x 1 week. CP and SOB occur with exertion and resolve with rest. Also reports 2 weeks of melanotic stool. Chronic cough with production of yellow sputum beginning yesterday. No fever, n/v, abdominal pain, LE swelling.   The history is provided by the patient. No language interpreter was used.    Past Medical History  Diagnosis Date  . Adenomatous colon polyp   . Diverticulosis   . GERD (gastroesophageal reflux disease)   . Hiatal hernia   . Alzheimer disease   . Gout     "hands; backbone; elbows; shoulders"  . Asthma with bronchitis   . COPD (chronic obstructive pulmonary disease)   . Chronic hypoxemic respiratory failure   . OSA (obstructive sleep apnea)     CPAP  . CHF (congestive heart failure)     grade 2 diastolic dysfunction, EF 65-70% on ECHO 12/2011  . Blood transfusion     "I've had about 6 pints" (12/31/2011)  . Pneumonia     intermittent  . Anemia   . GI bleed     GI Dr. Marina Goodell,   . Hypertension   . Peripheral vascular disease     PAD  . Dysrhythmia     "skips"  . Anginal pain   . Myocardial infarction     "they said I had 2 light ones"  . Shortness of breath 12/31/2011    "all the time"  . Type II diabetes mellitus     with complications:  retinopathy, nephrophathy.   . Arthritis     "plenty"  . Chronic lower back pain   . Renal insufficiency     "went on dialysis probably 3 times" Last 04/2011  . Skin cancer     "forehead; arms"  . Depression   . Diverticulosis   . Colon polyps     adenomatous and hyperplastic  . GERD (gastroesophageal reflux disease)   . Hiatal hernia    . Memory deficits 01/11/2013  . Obesity   . Carotid bruit     bilateral   Past Surgical History  Procedure Laterality Date  . Hand surgery  1990s    "chain saw accident; had to reattach fingers on left hand"  . Esophagogastroduodenoscopy  05/05/2011    Procedure: ESOPHAGOGASTRODUODENOSCOPY (EGD);  Surgeon: Louis Meckel, MD;  Location: [facility];  Service: Endoscopy;  Laterality: N/A;  . Flexible sigmoidoscopy  05/05/2011    Procedure: FLEXIBLE SIGMOIDOSCOPY;  Surgeon: Louis Meckel, MD;  Location: [facility];  Service: Endoscopy;  Laterality: N/A;  . Laparoscopic cholecystectomy  ~ 2001  . Excisional hemorrhoidectomy  1950's  . Cataract extraction w/ intraocular lens  implant, bilateral    . Coronary artery bypass graft  1997    CABG X2 vessels  . Iliac artery stent  11/2011    left/H&P  . Renal artery stent  2010    left/H&P  . Iliac artery stent  11/2011    right  . Coronary angioplasty with stent placement  12/31/2011    "I have 15 stents; heart, kidney, aorta, legs"  . Colonoscopy w/ biopsies and polypectomy      "  have had probably 5 cut out" (12/31/2011)  . Enteroscopy  03/04/2012    Procedure: ENTEROSCOPY;  Surgeon: Louis Meckel, MD;  Location: [facility] END[facility];  Service: Endoscopy;  Laterality: N/A;  jessica/leone  . Coronary artery bypass graft  1997   Family History  Problem Relation Age of Onset  . Lung cancer Brother   . Diabetes Mother   . Arthritis Mother   . Heart attack Mother   . Heart disease Sister   . Stomach cancer Maternal Grandfather   . Heart disease Father   . Heart attack Father   . Heart disease Brother   . ALS Sister   . ALS      nephew/neice  . Colon cancer Neg Hx    History  Substance Use Topics  . Smoking status: Former Smoker -- 2.50 packs/day for 45 years    Types: Cigarettes    Quit date: 05/25/1990  . Smokeless tobacco: Former Neurosurgeon    Types: Chew    Quit date: 06/01/1990  . Alcohol Use: No     Comment: 12/31/2011 "used to  drink; last alcohol has been 30 years I imagine"    Review of Systems  Constitutional: Positive for activity change and fatigue. Negative for fever, diaphoresis and appetite change.  Respiratory: Positive for cough, chest tightness and shortness of breath.   Cardiovascular: Positive for chest pain. Negative for palpitations and leg swelling.  Gastrointestinal: Positive for blood in stool. Negative for nausea, vomiting, abdominal pain, diarrhea and abdominal distention.  Musculoskeletal: Negative for back pain.  All other systems reviewed and are negative.    Allergies  Donepezil; Heparin; Hydralazine; Hydromorphone; Morphine; Plavix; Promethazine; Sulfonamide derivatives; Zaroxolyn; Cefuroxime; Metoprolol; and Ertapenem  Home Medications   Current Outpatient Rx  Name  Route  Sig  Dispense  Refill  . acetaminophen (TYLENOL) 650 MG CR tablet      TAKE 1300 MG BY MOUTH 2 TIMES DAILY         . albuterol (PROVENTIL) (2.5 MG/3ML) 0.083% nebulizer solution   Nebulization   Take 2.5 mg by nebulization every 6 (six) hours as needed. For shortness of breath         . allopurinol (ZYLOPRIM) 300 MG tablet   Oral   Take 300 mg by mouth 2 (two) times daily.         Marland Kitchen EXPIRED: amLODipine (NORVASC) 10 MG tablet   Oral   Take 1 tablet (10 mg total) by mouth daily.   30 tablet   5   . aspirin EC 325 MG tablet   Oral   Take 325 mg by mouth daily.         Marland Kitchen atorvastatin (LIPITOR) 20 MG tablet   Oral   Take 20 mg by mouth at bedtime.         . bisoprolol (ZEBETA) 5 MG tablet   Oral   Take 1 tablet (5 mg total) by mouth daily.   30 tablet   11   . cloNIDine HCl (KAPVAY) 0.1 MG TB12 ER tablet   Oral   Take 1 tablet (0.1 mg total) by mouth 2 (two) times daily.   60 tablet   5   . colchicine 0.6 MG tablet   Oral   Take 0.6 mg by mouth daily.          Marland Kitchen escitalopram (LEXAPRO) 20 MG tablet   Oral   Take 20 mg by mouth daily.         Marland Kitchen ezetimibe (  ZETIA) 10 MG  tablet   Oral   Take 10 mg by mouth daily.           . folic acid (FOLVITE) 400 MCG tablet   Oral   Take 400 mcg by mouth daily.           . furosemide (LASIX) 80 MG tablet   Oral   Take 1 tablet (80 mg total) by mouth 2 (two) times daily.   180 tablet   3   . gabapentin (NEURONTIN) 100 MG tablet   Oral   Take 100 mg by mouth at bedtime.           . insulin aspart (NOVOLOG) 100 UNIT/ML injection   Subcutaneous   Inject 20 Units into the skin 3 (three) times daily with meals.         . insulin glargine (LANTUS) 100 UNIT/ML injection   Subcutaneous   Inject 52 Units into the skin 2 (two) times daily.         . isosorbide mononitrate (IMDUR) 30 MG 24 hr tablet   Oral   Take 60 mg by mouth daily.          . memantine (NAMENDA) 10 MG tablet   Oral   Take 1 tablet (10 mg total) by mouth 2 (two) times daily.   60 tablet   11   . metolazone (ZAROXOLYN) 2.5 MG tablet      Take one tablet by mouth 30 minutes prior to your morning Furosemide dose as needed based on weight   1 tablet   0   . Multiple Vitamin (MULTIVITAMIN WITH MINERALS) TABS   Oral   Take 0.5 tablets by mouth 2 (two) times daily.         . nitroGLYCERIN (NITROSTAT) 0.4 MG SL tablet   Sublingual   Place 0.4 mg under the tongue every 5 (five) minutes as needed. May repeat x 3 for chest pain         . pantoprazole (PROTONIX) 40 MG tablet   Oral   Take 40 mg by mouth daily.           . predniSONE (DELTASONE) 5 MG tablet   Oral   Take 5 mg by mouth daily.         Marland Kitchen tiotropium (SPIRIVA) 18 MCG inhalation capsule   Inhalation   Place 18 mcg into inhaler and inhale daily.          BP 160/42  Pulse 62  Temp(Src) 98.7 F (37.1 C) (Oral)  Resp 19  SpO2 98% Physical Exam  Vitals reviewed. Constitutional: He is oriented to person, place, and time. He appears well-developed and well-nourished. No distress.  HENT:  Head: Normocephalic.  Eyes: Conjunctivae are normal.  Neck: No JVD  present.  Cardiovascular: Normal rate, regular rhythm, normal heart sounds and intact distal pulses.   Pulmonary/Chest: Effort normal and breath sounds normal.  Abdominal: Soft. Bowel sounds are normal. He exhibits no distension. There is no tenderness. There is no rebound.  Genitourinary: Rectal exam shows no external hemorrhoid, no internal hemorrhoid, no fissure and no tenderness. Guaiac positive stool.  Neurological: He is alert and oriented to person, place, and time.  Skin: Skin is warm and dry.    ED Course  Procedures (including critical care time) Labs Review Labs Reviewed  POCT I-STAT, CHEM 8 - Abnormal; Notable for the following:    Potassium 5.8 (*)    BUN 82 (*)    Creatinine,  Ser 2.30 (*)    Glucose, Bld 278 (*)    Hemoglobin 7.8 (*)    HCT 23.0 (*)    All other components within normal limits  OCCULT BLOOD, POC DEVICE - Abnormal; Notable for the following:    Fecal Occult Bld POSITIVE (*)    All other components within normal limits  CBC  BASIC METABOLIC PANEL  PRO B NATRIURETIC PEPTIDE   Imaging Review Dg Chest Port 1 View  03/03/2013   CLINICAL DATA:  Chest pain.  EXAM: PORTABLE CHEST - 1 VIEW  COMPARISON:  Single view of the chest 09/20/2012 and PA and lateral chest 02/27/2013.  FINDINGS: There is cardiomegaly and vascular congestion. No consolidative process, pneumothorax or effusion is identified.  IMPRESSION: Cardiomegaly and vascular congestion without acute disease.   Electronically Signed   By: Drusilla Kanner M.D.   On: 03/03/2013 18:09    EKG Interpretation   None      Date: 03/04/2013  Rate: 68  Rhythm: normal sinus rhythm  QRS Axis: normal  Intervals: normal  ST/T Wave abnormalities: nonspecific ST changes  Conduction Disutrbances:none  Narrative Interpretation: sinus rhythm, PACs  Old EKG Reviewed: unchanged    MDM   1. Blood loss anemia   2. Acute on chronic diastolic heart failure   3. Chest pain   4. Type II or unspecified type  diabetes mellitus without mention of complication, not stated as uncontrolled   5. CAD (coronary artery disease)   6. Gastric and duodenal angiodysplasia with hemorrhage   7. HTN (hypertension)   8. Acute posthemorrhagic anemia   9. Hemorrhage of gastrointestinal tract, unspecified   10. Benign neoplasm of stomach     79 y/o male with history of DM OSA, HTN, CKD presenting with symptomatic anemia (SOB/chest pain with exertion). Likely secondary to GI bleed given heme + stool and history of melena. History of prior gastric AVMs. HDS. Hb 7.8. Will transfuse pRBC. Chest pain free with rest. Troponin negative. No ischemic changes on EKG. Will admit to medicine. Protonix given. GI to see in am.  Labs and imaging reviewed in my medical decision making if ordered. Patient discussed with my attending, Dr. Bluford Kaufmann, MD 03/04/13 1344

## 2013-03-03 NOTE — Telephone Encounter (Signed)
Informed wife of order sent to scheduler to have lab and see Dr. Bertis Ruddy on Tuesday 10/14.  Lab at 9 am and Dr. Bertis Ruddy at 9:30 am.  She verbalized understanding.

## 2013-03-03 NOTE — ED Provider Notes (Addendum)
I have supervised the resident on the management of this patient and agree with the note above. I personally interviewed and examined the patient and my addendum is below.   [name] is a 79 y.o. male hx of CAD with multiple stents, here with chest pain and melena. Melena for 6 weeks. Chest pain and shortness of breath on exertion for the last week. Hg 7.8 here. Occ positive. He likely has symptomatic anemia. He was given transfusion and admitted.   I have personally reviewed the EKG and agreed with the resident's interpretation    CRITICAL CARE Performed by: Silverio Lay, DAVID   Total critical care time: 30 min   Critical care time was exclusive of separately billable procedures and treating other patients.  Critical care was necessary to treat or prevent imminent or life-threatening deterioration.  Critical care was time spent personally by me on the following activities: development of treatment plan with patient and/or surrogate as well as nursing, discussions with consultants, evaluation of patient's response to treatment, examination of patient, obtaining history from patient or surrogate, ordering and performing treatments and interventions, ordering and review of laboratory studies, ordering and review of radiographic studies, pulse oximetry and re-evaluation of patient's condition.    Richardean Canal, MD 03/03/13 1905  Richardean Canal, MD 03/15/13 [id number]

## 2013-03-03 NOTE — ED Notes (Signed)
To ED from home via EMS, pt reports left sided CP X1 week with exertion, no pain at rest, hx of MI and stents, VSS, CBG 315, no pain on arrival, A/O X4, NAD

## 2013-03-03 NOTE — ED Notes (Signed)
EMS gave 324 ASA pta 

## 2013-03-03 NOTE — H&P (Signed)
PCP:  Kaleen Mask, MD  GI: Yancey Flemings Cards: [facility] Used to be Dr. Caryn Section Hematology: Dr. Si Gaul  Chief Complaint:  Shortness of breath  HPI: [name] is a 79 y.o. male   has a past medical history of Adenomatous colon polyp; Diverticulosis; GERD (gastroesophageal reflux disease); Hiatal hernia; Alzheimer disease; Gout; Asthma with bronchitis; COPD (chronic obstructive pulmonary disease); Chronic hypoxemic respiratory failure; OSA (obstructive sleep apnea); CHF (congestive heart failure); Blood transfusion; Pneumonia; Anemia; GI bleed; Hypertension; Peripheral vascular disease; Dysrhythmia; Anginal pain; Myocardial infarction; Shortness of breath (12/31/2011); Type II diabetes mellitus; Arthritis; Chronic lower back pain; Renal insufficiency; Skin cancer; Depression; Diverticulosis; Colon polyps; GERD (gastroesophageal reflux disease); Hiatal hernia; Memory deficits (01/11/2013); Obesity; and Carotid bruit.   Presented with  Patient has hx of AVM's and has hx of Upper Gi Bleed in 2013. For the past 6 weeks he started to have dark stools which he attributed to Namenda that he was started on 6 weeks ago for demensia.  He is chronically on Aspirin 325 mg a day and prednisone 5 mg a day. For the past 1 week he have had worsening shortness of breath and chest pain with exertion. This goes away with rest. He went to his PCP 4 days ago and his hg was 8.1 which is down from 12 back in April. He was supposed to go to Hematology to have this followed up, as he has had Iron infusions in the past. He became very symptomatic and came to ER instead where his Hg was found to be 7.8. ER ordered 1 unit of PRBC and Hospitalist was called for admission. Currently he is chest pain free. Spoke with [facility] who recommended Protonix drip and n.p.o. will see in a.m.   Review of Systems:    Pertinent positives include:chest pain, melena, shortness of breath at rest, dyspnea on  exertion,  Constitutional:  No weight loss, night sweats, Fevers, chills, fatigue, weight loss  HEENT:  No headaches, Difficulty swallowing,Tooth/dental problems,Sore throat,  No sneezing, itching, ear ache, nasal congestion, post nasal drip,  Cardio-vascular:  No  Orthopnea, PND, anasarca, dizziness, palpitations.no Bilateral lower extremity swelling  GI:  No heartburn, indigestion, abdominal pain, nausea, vomiting, diarrhea, change in bowel habits, loss of appetite, blood in stool, hematemesis Resp:   No excess mucus, no productive cough, No non-productive cough, No coughing up of blood.No change in color of mucus.No wheezing. Skin:  no rash or lesions. No jaundice GU:  no dysuria, change in color of urine, no urgency or frequency. No straining to urinate.  No flank pain.  Musculoskeletal:  No joint pain or no joint swelling. No decreased range of motion. No back pain.  Psych:  No change in mood or affect. No depression or anxiety. No memory loss.  Neuro: no localizing neurological complaints, no tingling, no weakness, no double vision, no gait abnormality, no slurred speech, no confusion  Otherwise ROS are negative except for above, 10 systems were reviewed  Past Medical History: Past Medical History  Diagnosis Date  . Adenomatous colon polyp   . Diverticulosis   . GERD (gastroesophageal reflux disease)   . Hiatal hernia   . Alzheimer disease   . Gout     "hands; backbone; elbows; shoulders"  . Asthma with bronchitis   . COPD (chronic obstructive pulmonary disease)   . Chronic hypoxemic respiratory failure   . OSA (obstructive sleep apnea)     CPAP  . CHF (congestive heart failure)  grade 2 diastolic dysfunction, EF 65-70% on ECHO 12/2011  . Blood transfusion     "I've had about 6 pints" (12/31/2011)  . Pneumonia     intermittent  . Anemia   . GI bleed     GI Dr. Marina Goodell,   . Hypertension   . Peripheral vascular disease     PAD  . Dysrhythmia     "skips"  .  Anginal pain   . Myocardial infarction     "they said I had 2 light ones"  . Shortness of breath 12/31/2011    "all the time"  . Type II diabetes mellitus     with complications:  retinopathy, nephrophathy.   . Arthritis     "plenty"  . Chronic lower back pain   . Renal insufficiency     "went on dialysis probably 3 times" Last 04/2011  . Skin cancer     "forehead; arms"  . Depression   . Diverticulosis   . Colon polyps     adenomatous and hyperplastic  . GERD (gastroesophageal reflux disease)   . Hiatal hernia   . Memory deficits 01/11/2013  . Obesity   . Carotid bruit     bilateral   Past Surgical History  Procedure Laterality Date  . Hand surgery  1990s    "chain saw accident; had to reattach fingers on left hand"  . Esophagogastroduodenoscopy  05/05/2011    Procedure: ESOPHAGOGASTRODUODENOSCOPY (EGD);  Surgeon: Louis Meckel, MD;  Location: [facility];  Service: Endoscopy;  Laterality: N/A;  . Flexible sigmoidoscopy  05/05/2011    Procedure: FLEXIBLE SIGMOIDOSCOPY;  Surgeon: Louis Meckel, MD;  Location: [facility];  Service: Endoscopy;  Laterality: N/A;  . Laparoscopic cholecystectomy  ~ 2001  . Excisional hemorrhoidectomy  1950's  . Cataract extraction w/ intraocular lens  implant, bilateral    . Coronary artery bypass graft  1997    CABG X2 vessels  . Iliac artery stent  11/2011    left/H&P  . Renal artery stent  2010    left/H&P  . Iliac artery stent  11/2011    right  . Coronary angioplasty with stent placement  12/31/2011    "I have 15 stents; heart, kidney, aorta, legs"  . Colonoscopy w/ biopsies and polypectomy      "have had probably 5 cut out" (12/31/2011)  . Enteroscopy  03/04/2012    Procedure: ENTEROSCOPY;  Surgeon: Louis Meckel, MD;  Location: [facility] ENDOSCOPY;  Service: Endoscopy;  Laterality: N/A;  jessica/leone  . Coronary artery bypass graft  1997     Medications: Prior to Admission medications   Medication Sig Start Date End Date Taking?  Authorizing Provider  acetaminophen (TYLENOL) 650 MG CR tablet TAKE 1300 MG BY MOUTH 2 TIMES DAILY    Historical Provider, MD  albuterol (PROVENTIL) (2.5 MG/3ML) 0.083% nebulizer solution Take 2.5 mg by nebulization every 6 (six) hours as needed. For shortness of breath    Historical Provider, MD  allopurinol (ZYLOPRIM) 300 MG tablet Take 300 mg by mouth 2 (two) times daily.    Historical Provider, MD  amLODipine (NORVASC) 10 MG tablet Take 1 tablet (10 mg total) by mouth daily. 12/11/11 12/28/12  Wilburt Finlay, PA-C  aspirin EC 325 MG tablet Take 325 mg by mouth daily.    Historical Provider, MD  atorvastatin (LIPITOR) 20 MG tablet Take 20 mg by mouth at bedtime.    Historical Provider, MD  bisoprolol (ZEBETA) 5 MG tablet Take 1 tablet (5 mg total)  by mouth daily. 09/21/12   Rhonda G Barrett, PA-C  cloNIDine HCl (KAPVAY) 0.1 MG TB12 ER tablet Take 1 tablet (0.1 mg total) by mouth 2 (two) times daily. 12/11/11   Wilburt Finlay, PA-C  colchicine 0.6 MG tablet Take 0.6 mg by mouth daily.     Historical Provider, MD  escitalopram (LEXAPRO) 20 MG tablet Take 20 mg by mouth daily.    Historical Provider, MD  ezetimibe (ZETIA) 10 MG tablet Take 10 mg by mouth daily.      Historical Provider, MD  folic acid (FOLVITE) 400 MCG tablet Take 400 mcg by mouth daily.      Historical Provider, MD  furosemide (LASIX) 80 MG tablet Take 1 tablet (80 mg total) by mouth 2 (two) times daily. 06/29/12   Vickki Hearing, MD  gabapentin (NEURONTIN) 100 MG tablet Take 100 mg by mouth at bedtime.      Historical Provider, MD  insulin aspart (NOVOLOG) 100 UNIT/ML injection Inject 20 Units into the skin 3 (three) times daily with meals.    Historical Provider, MD  insulin glargine (LANTUS) 100 UNIT/ML injection Inject 59 Units into the skin 2 (two) times daily.     Historical Provider, MD  isosorbide mononitrate (IMDUR) 30 MG 24 hr tablet Take 60 mg by mouth daily.  03/09/11   Historical Provider, MD  memantine (NAMENDA) 10 MG tablet  Take 1 tablet (10 mg total) by mouth 2 (two) times daily. 02/08/13   York Spaniel, MD  metolazone (ZAROXOLYN) 2.5 MG tablet Take one tablet by mouth 30 minutes prior to your morning Furosemide dose as needed based on weight 07/15/12   Tonny Bollman, MD  Multiple Vitamin (MULTIVITAMIN WITH MINERALS) TABS Take 0.5 tablets by mouth 2 (two) times daily.    Historical Provider, MD  nitroGLYCERIN (NITROSTAT) 0.4 MG SL tablet Place 0.4 mg under the tongue every 5 (five) minutes as needed. May repeat x 3 for chest pain    Historical Provider, MD  pantoprazole (PROTONIX) 40 MG tablet Take 40 mg by mouth daily.      Historical Provider, MD  predniSONE (DELTASONE) 5 MG tablet Take 5 mg by mouth daily. 11/07/12   Historical Provider, MD  tiotropium (SPIRIVA) 18 MCG inhalation capsule Place 18 mcg into inhaler and inhale daily.    Historical Provider, MD    Allergies:   Allergies  Allergen Reactions  . Donepezil Diarrhea  . Heparin Rash and Other (See Comments)    Family stated it almost killed him. Hallucinations and itching.  Marland Kitchen Hydralazine Other (See Comments)    DO NOT TAKE PER MD  . Hydromorphone Itching  . Morphine Itching  . Plavix [Clopidogrel Bisulfate] Other (See Comments)    GI BLEEDING; "so bad I had to take blood; dr took me off it"  . Promethazine Other (See Comments)    respiratory failure  . Sulfonamide Derivatives Itching  . Cefuroxime Other (See Comments)    REACTION: unkown reaction  . Metoprolol Other (See Comments)    unknown  . Ertapenem Itching and Rash    12/31/2011 pt does not recall allergy or severity    Social History:  Ambulatory   Independently  Lives at  Home with family   reports that he quit smoking about 22 years ago. His smoking use included Cigarettes. He has a 112.5 pack-year smoking history. He quit smokeless tobacco use about 22 years ago. His smokeless tobacco use included Chew. He reports that he does not drink alcohol or  use illicit drugs.   Family  History: family history includes ALS in his sister and another family member; Arthritis in his mother; Diabetes in his mother; Heart attack in his father and mother; Heart disease in his brother, father, and sister; Lung cancer in his brother; Stomach cancer in his maternal grandfather. There is no history of Colon cancer.    Physical Exam: Patient Vitals for the past 24 hrs:  BP Temp Temp src Pulse Resp SpO2  03/03/13 1745 160/42 mmHg - - 62 19 98 %  03/03/13 1739 156/47 mmHg 98.7 F (37.1 C) Oral 67 20 100 %    1. General:  in No Acute distress 2. Psychological: Alert and Oriented 3. Head/ENT:   Moist  Mucous Membranes                          Head Non traumatic, neck supple                          Normal  Dentition 4. SKIN: normal Skin turgor,  Skin clean Dry and intact no rash, pale 5. Heart: irregular rate and rhythm no Murmur, Rub or gallop 6. Lungs: no wheezes but crackles bilaterally at the bases. 7. Abdomen: Soft, non-tender, Non distended 8. Lower extremities: no clubbing, cyanosis, or edema 9. Neurologically:  Grossly intact, moving all 4 extremities equally 10. MSK: Normal range of motion Rectal exam significant for being Hemoccult-positive body mass index is unknown because there is no weight on file.   Labs on Admission:   Recent Labs  03/03/13 1742 03/03/13 1808  NA 138 139  K 4.6 5.8*  CL 99 102  CO2 26  --   GLUCOSE 279* 278*  BUN 56* 82*  CREATININE 1.98* 2.30*  CALCIUM 9.5  --    No results found for this basename: AST, ALT, ALKPHOS, BILITOT, PROT, ALBUMIN,  in the last 72 hours No results found for this basename: LIPASE, AMYLASE,  in the last 72 hours  Recent Labs  03/03/13 1808  HGB 7.8*  HCT 23.0*   No results found for this basename: CKTOTAL, CKMB, CKMBINDEX, TROPONINI,  in the last 72 hours No results found for this basename: TSH, T4TOTAL, FREET3, T3FREE, THYROIDAB,  in the last 72 hours No results found for this basename: VITAMINB12,  FOLATE, FERRITIN, TIBC, IRON, RETICCTPCT,  in the last 72 hours Lab Results  Component Value Date   HGBA1C 9.9* 09/20/2012    The CrCl is unknown because both a height and weight (above a minimum accepted value) are required for this calculation. ABG    Component Value Date/Time   PHART 7.288* 01/09/2010 1016   HCO3 27.7* 01/09/2010 1016   TCO2 27 03/03/2013 1808   O2SAT 99.0 01/09/2010 1016     Lab Results  Component Value Date   DDIMER 1.39* 11/20/2010     Other results:  I have pearsonaly reviewed this: ECG REPORT  Rate: 68  Rhythm: NSR ST&T Change:    BNP 820  Cultures:    Component Value Date/Time   SDES URINE, CATHETERIZED 05/02/2011 1049   SPECREQUEST Normal 05/02/2011 1049   CULT  Value: STAPHYLOCOCCUS SPECIES (COAGULASE NEGATIVE) Note: RIFAMPIN AND GENTAMICIN SHOULD NOT BE USED AS SINGLE DRUGS FOR TREATMENT OF STAPH INFECTIONS. 05/02/2011 1049   REPTSTATUS 05/04/2011 FINAL 05/02/2011 1049       Radiological Exams on Admission: Dg Chest Port 1 View  03/03/2013  CLINICAL DATA:  Chest pain.  EXAM: PORTABLE CHEST - 1 VIEW  COMPARISON:  Single view of the chest 09/20/2012 and PA and lateral chest 02/27/2013.  FINDINGS: There is cardiomegaly and vascular congestion. No consolidative process, pneumothorax or effusion is identified.  IMPRESSION: Cardiomegaly and vascular congestion without acute disease.   Electronically Signed   By: Drusilla Kanner M.D.   On: 03/03/2013 18:09    Chart has been reviewed  Assessment/Plan  79yo M w hx of CAD, AVM malformations with history of GI bleeds in the past presents with melena symptomatic anemia and chest   Present on Admission:  . GI BLEED, 12/12- gastric AVM ablated in the past - spoke to GI who will see in the morning. Continue Protonix drip, n.p.o. transfuse 1 unit packed red blood cells, follow CBC every 6 hours, monitor and step down. Will hold aspirin. Patient of note on chronic prednisone for his gout  . Anemia -  transfuse one unit and follow CBC most likely secondary to GI bleed . Shortness of breath - most likely symptomatic due to anemia. Patient also appears to be in slightly fluid overloaded will give Lasix after administration of one unit of packed red blood cells keep hemoglobin above 8.  . Paroxysmal atrial fibrillation - currently appears to be in sinus with PACs will hold aspirin given GI bleed  . Obstructive sleep apnea Delford Field for home CPAP  . HTN (hypertension) - continue home medications  . DM - decrease Lantus wound by mouth to 30 units twice a day, order sensitive sliding scale  . Chronic renal insufficiency, stage III (moderate) - Baseline creatinine roughly 1.6 - creatinine somewhat above baseline but appears to be somewhat fluid overloaded. Continue to monitor avoid overdiuresis  . Diastolic dysfunction, - grade 2 by echo 01/01/12 - based on physical exam slightly fluid overloaded at a time to give Lasix after blood administration continue his home dose of Lasix currently hemodynamically stable and not hypotensive  . CAD (coronary artery disease) - symptomatic anemia likely cause of chest pain but will cycle cardiac markers. Continue Zebeta hold aspirin given likely upper GI bleed     Prophylaxis: SCD  Protonix  CODE STATUS: FULL CODE  Other plan as per orders.  I have spent a total of 65 min on this admission: Time was taken to speak to a GI consult. Spoke with its length with family and patient regarding plan of care.   Deidra Spease 03/03/2013, 7:53 PM

## 2013-03-03 NOTE — Telephone Encounter (Signed)
S/w the pt and he is aware of his appts on 03/07/2013@9 :00am.

## 2013-03-04 ENCOUNTER — Encounter (HOSPITAL_COMMUNITY)
Admission: EM | Disposition: A | Discharge status: Home / Self Care | Payer: Self-pay | Source: Home / Self Care | Attending: Internal Medicine

## 2013-03-04 ENCOUNTER — Encounter (HOSPITAL_COMMUNITY): Payer: Self-pay | Admitting: Gastroenterology

## 2013-03-04 DIAGNOSIS — K922 Gastrointestinal hemorrhage, unspecified: Secondary | ICD-10-CM

## 2013-03-04 DIAGNOSIS — D500 Iron deficiency anemia secondary to blood loss (chronic): Secondary | ICD-10-CM

## 2013-03-04 DIAGNOSIS — D62 Acute posthemorrhagic anemia: Secondary | ICD-10-CM

## 2013-03-04 DIAGNOSIS — I2510 Atherosclerotic heart disease of native coronary artery without angina pectoris: Secondary | ICD-10-CM

## 2013-03-04 DIAGNOSIS — I10 Essential (primary) hypertension: Secondary | ICD-10-CM

## 2013-03-04 DIAGNOSIS — K31811 Angiodysplasia of stomach and duodenum with bleeding: Secondary | ICD-10-CM

## 2013-03-04 DIAGNOSIS — D131 Benign neoplasm of stomach: Secondary | ICD-10-CM

## 2013-03-04 HISTORY — PX: ESOPHAGOGASTRODUODENOSCOPY: SHX5428

## 2013-03-04 LAB — COMPREHENSIVE METABOLIC PANEL
ALT: 17 U/L (ref 0–53)
AST: 25 U/L (ref 0–37)
Alkaline Phosphatase: 74 U/L (ref 39–117)
CO2: 26 mEq/L (ref 19–32)
Calcium: 9.4 mg/dL (ref 8.4–10.5)
Creatinine, Ser: 1.82 mg/dL — ABNORMAL HIGH (ref 0.50–1.35)
GFR calc Af Amer: 40 mL/min — ABNORMAL LOW (ref >90)
GFR calc non Af Amer: 34 mL/min — ABNORMAL LOW (ref >90)
Glucose, Bld: 172 mg/dL — ABNORMAL HIGH (ref 70–99)
Sodium: 141 mEq/L (ref 135–145)

## 2013-03-04 LAB — CBC
Hemoglobin: 9.1 g/dL — ABNORMAL LOW (ref 13.0–17.0)
MCHC: 31.2 g/dL (ref 30.0–36.0)
Platelets: 229 10*3/uL (ref 150–400)
RBC: 3.4 MIL/uL — ABNORMAL LOW (ref 4.22–5.81)

## 2013-03-04 LAB — GLUCOSE, CAPILLARY
Glucose-Capillary: 171 mg/dL — ABNORMAL HIGH (ref 70–99)
Glucose-Capillary: 225 mg/dL — ABNORMAL HIGH (ref 70–99)
Glucose-Capillary: 250 mg/dL — ABNORMAL HIGH (ref 70–99)

## 2013-03-04 LAB — MRSA PCR SCREENING: MRSA by PCR: POSITIVE — AB

## 2013-03-04 LAB — TROPONIN I: Troponin I: <0.3 ng/mL (ref <0.30)

## 2013-03-04 SURGERY — EGD (ESOPHAGOGASTRODUODENOSCOPY)
Anesthesia: Moderate Sedation

## 2013-03-04 MED ORDER — MIDAZOLAM HCL 5 MG/ML IJ SOLN
INTRAMUSCULAR | Status: AC
  Filled 2013-03-04: qty 2

## 2013-03-04 MED ORDER — MIDAZOLAM HCL 5 MG/5ML IJ SOLN
INTRAMUSCULAR | Status: DC | PRN
  Administered 2013-03-04 (×2): 2 mg via INTRAVENOUS

## 2013-03-04 MED ORDER — FENTANYL CITRATE 0.05 MG/ML IJ SOLN
INTRAMUSCULAR | Status: DC | PRN
  Administered 2013-03-04: 25 ug via INTRAVENOUS
  Administered 2013-03-04: 12.5 ug via INTRAVENOUS

## 2013-03-04 MED ORDER — ALBUTEROL SULFATE (5 MG/ML) 0.5% IN NEBU
2.5000 mg | INHALATION_SOLUTION | Freq: Four times a day (QID) | RESPIRATORY_TRACT | Status: DC
  Administered 2013-03-04 – 2013-03-05 (×3): 2.5 mg via RESPIRATORY_TRACT
  Filled 2013-03-04 (×3): qty 0.5

## 2013-03-04 MED ORDER — SODIUM CHLORIDE 0.9 % IV SOLN: INTRAVENOUS | Status: DC

## 2013-03-04 MED ORDER — CHLORHEXIDINE GLUCONATE CLOTH 2 % EX PADS
6.0000 | MEDICATED_PAD | Freq: Every day | CUTANEOUS | Status: DC
  Administered 2013-03-04 – 2013-03-06 (×4): 6 via TOPICAL

## 2013-03-04 MED ORDER — MUPIROCIN 2 % EX OINT
1.0000 "application " | TOPICAL_OINTMENT | Freq: Two times a day (BID) | CUTANEOUS | Status: DC
  Administered 2013-03-04 – 2013-03-06 (×5): 1 via NASAL
  Filled 2013-03-04: qty 22

## 2013-03-04 MED ORDER — BUTAMBEN-TETRACAINE-BENZOCAINE 2-2-14 % EX AERO
INHALATION_SPRAY | CUTANEOUS | Status: DC | PRN
  Administered 2013-03-04: 2 via TOPICAL

## 2013-03-04 MED ORDER — POLYETHYLENE GLYCOL 3350 17 GM/SCOOP PO POWD
1.0000 | Freq: Once | ORAL | Status: AC
  Administered 2013-03-04: 1 via ORAL
  Filled 2013-03-04: qty 255

## 2013-03-04 MED ORDER — FENTANYL CITRATE 0.05 MG/ML IJ SOLN
INTRAMUSCULAR | Status: AC
  Filled 2013-03-04: qty 4

## 2013-03-04 NOTE — Progress Notes (Signed)
PT was placed on home CPAP machine with home full face mask. 2 LPM oxygen bled in. Pt is comfortable. RT will continue to monitor.

## 2013-03-04 NOTE — Op Note (Signed)
[name] [address] [place], [address]   ENDOSCOPY PROCEDURE REPORT  PATIENT: Thomas, Knox.  MR#: [id number] BIRTHDATE: 10/10/1933 , 79  yrs. old GENDER: Male ENDOSCOPIST: Louis Meckel, MD REFERRED BY:  Roxy Cedar, M.D.  Fannie Knee, M.D. PROCEDURE DATE:  03/04/2013 PROCEDURE:  EGD w/ control of bleeding ASA CLASS:     Class III INDICATIONS: MEDICATIONS: These medications were titrated to patient response per physician's verbal order, Fentanyl-Detailed 37.5 mcg IV, and Versed 4 mg IV TOPICAL ANESTHETIC: Cetacaine Spray  DESCRIPTION OF PROCEDURE: After the risks benefits and alternatives of the procedure were thoroughly explained, informed consent was obtained.  The Pentax Gastroscope [id number] endoscope was introduced through the mouth and advanced to the third portion of the duodenum. Without limitations.  The instrument was slowly withdrawn as the mucosa was fully examined.      There were numerous sessile nonbleeding polyps ranging from 2-5 mm. in the gastric fundus and cardia.  2 polyps in the gastric fundus are friable.  These were cauterized utilizing the argon plasma coagulator.   There were numerous sessile nonbleeding polyps ranging from 2-5 mm.  in the gastric fundus and cardia.  2 polyps in the gastric fundus are friable.  These were cauterized utilizing the argon plasma coagulator.   The remainder of the upper endoscopy exam was otherwise normal. There was no fresh or old blood. Retroflexed views revealed no abnormalities.     The scope was then withdrawn from the patient and the procedure completed.  COMPLICATIONS: There were no complications. ENDOSCOPIC IMPRESSION: 1.   multiple gastric nonbleeding polyps 2.  2 slightly friable polyps-cauterized with argon plasma coagulator  source for GI blood loss not certain  RECOMMENDATIONS: colonoscopy REPEAT EXAM:  eSigned:  Louis Meckel, MD 03/04/2013 11:23  AM   CC:  PATIENT NAME:  [name]. MR#: [id number]

## 2013-03-04 NOTE — Progress Notes (Signed)
Endoscopy demonstrated multiple gastric polyps.  2 friable polyps were cauterized.  Definitive bleeding source was not established by this exam.  Plan colonoscopy in a.m.

## 2013-03-04 NOTE — ED Provider Notes (Signed)
I have supervised the resident on the management of this patient and agree with the note above. I personally interviewed and examined the patient and my addendum is below.   [name] is a 79 y.o. male history diabetes, hypertension, CK D. here presenting with symptomatic anemia. He's been having melena as well as bloody stool for the last 6 weeks. For the last week he has some shortness of breath on exertion as well as chest pain. His hemoglobin was 8.0 today and baseline of 12. He was given protonic drip in the ER and transfusion was ordered. Will admit for symptomatic anemia.   CRITICAL CARE Performed by: Silverio Lay, DAVID   Total critical care time: 30 min   Critical care time was exclusive of separately billable procedures and treating other patients.  Critical care was necessary to treat or prevent imminent or life-threatening deterioration.  Critical care was time spent personally by me on the following activities: development of treatment plan with patient and/or surrogate as well as nursing, discussions with consultants, evaluation of patient's response to treatment, examination of patient, obtaining history from patient or surrogate, ordering and performing treatments and interventions, ordering and review of laboratory studies, ordering and review of radiographic studies, pulse oximetry and re-evaluation of patient's condition.    Richardean Canal, MD 03/04/13 1515

## 2013-03-04 NOTE — Consult Note (Signed)
Consultation  Referring Provider:  [facility]    Primary Care Physician:  Kaleen Mask, MD Primary Gastroenterologist:    Yancey Flemings, MD     Reason for Consultation:  GI bleed            HPI:   [name] is a 79 y.o. male with multiple significant medical problems including, but not limited to CAD/ multiple stents, COPD, CHF, diabetes, and CKD. He is known to our practice for a history of adenomatous colon polyps (year 2000, 2007) and recurrent gastrointestinal bleeding, likely from intestinal AVMs. Patient admitted yesterday with SOB, chest pain, and anemia. Hgb late April was 12.2, it was 7.8 in ED. After 2 units of blood it is 9.1 today. Troponin <0.30 today.    Patient took prune juice for constipation several days ago. Following that he passed loose black stools for a week. Last black stool was yesterday. Patient takes a full ASA daily. No other NSAIDs. He is on PPI therapy at home. No abdominal pain, no nausea. Patient has CKD, yesterday BUN 82, creatinine 2.3. Numbers much better today, actually about baseline.   Past Medical History  Diagnosis Date  . Adenomatous colon polyp   . Diverticulosis   . GERD (gastroesophageal reflux disease)   . Alzheimer disease   . Gout     "hands; backbone; elbows; shoulders"  . Asthma with bronchitis   . COPD (chronic obstructive pulmonary disease)   . Chronic hypoxemic respiratory failure   . OSA (obstructive sleep apnea)     CPAP  . CHF (congestive heart failure)     grade 2 diastolic dysfunction, EF 65-70% on ECHO 12/2011  . Pneumonia     intermittent  . Anemia   . Intestinal AVMs   . Hypertension   . Peripheral vascular disease     PAD  . Dysrhythmia     "skips"  . Myocardial infarction     "they said I had 2 light ones"  . Type II diabetes mellitus     with complications:  retinopathy, nephrophathy.   . Arthritis     "plenty"  . Chronic lower back pain   . Renal insufficiency     "went on dialysis  probably 3 times" Last 04/2011  . Skin cancer     "forehead; arms"  . Depression   . Diverticulosis   . GERD (gastroesophageal reflux disease)   . Hiatal hernia   . Memory deficits 01/11/2013  . Obesity   . Carotid bruit     bilateral    Past Surgical History  Procedure Laterality Date  . Hand surgery  1990s    "chain saw accident; had to reattach fingers on left hand"  . Esophagogastroduodenoscopy  05/05/2011    Procedure: ESOPHAGOGASTRODUODENOSCOPY (EGD);  Surgeon: Louis Meckel, MD;  Location: [facility];  Service: Endoscopy;  Laterality: N/A;  . Flexible sigmoidoscopy  05/05/2011    Procedure: FLEXIBLE SIGMOIDOSCOPY;  Surgeon: Louis Meckel, MD;  Location: [facility] ENDOSCOPY;  Service: Endoscopy;  Laterality: N/A;  . Laparoscopic cholecystectomy  ~ 2001  . Excisional hemorrhoidectomy  1950's  . Cataract extraction w/ intraocular lens  implant, bilateral    . Coronary artery bypass graft  1997    CABG X2 vessels  . Iliac artery stent  11/2011    left/H&P  . Renal artery stent  2010    left/H&P  . Iliac artery stent  11/2011    right  . Coronary angioplasty with stent  placement  12/31/2011    "I have 15 stents; heart, kidney, aorta, legs"  . Colonoscopy w/ biopsies and polypectomy      "have had probably 5 cut out" (12/31/2011)  . Enteroscopy  03/04/2012    Procedure: ENTEROSCOPY;  Surgeon: Louis Meckel, MD;  Location: [facility] ENDOSCOPY;  Service: Endoscopy;  Laterality: N/A;  jessica/leone  . Coronary artery bypass graft  1997    Family History  Problem Relation Age of Onset  . Lung cancer Brother   . Diabetes Mother   . Arthritis Mother   . Heart attack Mother   . Heart disease Sister   . Stomach cancer Maternal Grandfather   . Heart disease Father   . Heart attack Father   . Heart disease Brother   . ALS Sister   . ALS      nephew/neice  . Colon cancer Neg Hx      History  Substance Use Topics  . Smoking status: Former Smoker -- 2.50 packs/day for 45 years     Types: Cigarettes    Quit date: 05/25/1990  . Smokeless tobacco: Former Neurosurgeon    Types: Chew    Quit date: 06/01/1990  . Alcohol Use: No     Comment: 12/31/2011 "used to drink; last alcohol has been 30 years I imagine"    Prior to Admission medications   Medication Sig Start Date End Date Taking? Authorizing Provider  acetaminophen (TYLENOL) 650 MG CR tablet TAKE 1300 MG BY MOUTH 2 TIMES DAILY    Historical Provider, MD  albuterol (PROVENTIL) (2.5 MG/3ML) 0.083% nebulizer solution Take 2.5 mg by nebulization every 6 (six) hours as needed. For shortness of breath    Historical Provider, MD  allopurinol (ZYLOPRIM) 300 MG tablet Take 300 mg by mouth 2 (two) times daily.    Historical Provider, MD  amLODipine (NORVASC) 10 MG tablet Take 1 tablet (10 mg total) by mouth daily. 12/11/11 12/28/12  Wilburt Finlay, PA-C  aspirin EC 325 MG tablet Take 325 mg by mouth daily.    Historical Provider, MD  atorvastatin (LIPITOR) 20 MG tablet Take 20 mg by mouth at bedtime.    Historical Provider, MD  bisoprolol (ZEBETA) 5 MG tablet Take 1 tablet (5 mg total) by mouth daily. 09/21/12   Rhonda G Barrett, PA-C  cloNIDine HCl (KAPVAY) 0.1 MG TB12 ER tablet Take 1 tablet (0.1 mg total) by mouth 2 (two) times daily. 12/11/11   Wilburt Finlay, PA-C  colchicine 0.6 MG tablet Take 0.6 mg by mouth daily.     Historical Provider, MD  escitalopram (LEXAPRO) 20 MG tablet Take 20 mg by mouth daily.    Historical Provider, MD  ezetimibe (ZETIA) 10 MG tablet Take 10 mg by mouth daily.      Historical Provider, MD  folic acid (FOLVITE) 400 MCG tablet Take 400 mcg by mouth daily.      Historical Provider, MD  furosemide (LASIX) 80 MG tablet Take 1 tablet (80 mg total) by mouth 2 (two) times daily. 06/29/12   Vickki Hearing, MD  gabapentin (NEURONTIN) 100 MG tablet Take 100 mg by mouth at bedtime.      Historical Provider, MD  insulin aspart (NOVOLOG) 100 UNIT/ML injection Inject 20 Units into the skin 3 (three) times daily with meals.     Historical Provider, MD  insulin glargine (LANTUS) 100 UNIT/ML injection Inject 59 Units into the skin 2 (two) times daily.     Historical Provider, MD  isosorbide mononitrate (IMDUR) 30  MG 24 hr tablet Take 60 mg by mouth daily.  03/09/11   Historical Provider, MD  memantine (NAMENDA) 10 MG tablet Take 1 tablet (10 mg total) by mouth 2 (two) times daily. 02/08/13   York Spaniel, MD  metolazone (ZAROXOLYN) 2.5 MG tablet Take one tablet by mouth 30 minutes prior to your morning Furosemide dose as needed based on weight 07/15/12   Tonny Bollman, MD  Multiple Vitamin (MULTIVITAMIN WITH MINERALS) TABS Take 0.5 tablets by mouth 2 (two) times daily.    Historical Provider, MD  nitroGLYCERIN (NITROSTAT) 0.4 MG SL tablet Place 0.4 mg under the tongue every 5 (five) minutes as needed. May repeat x 3 for chest pain    Historical Provider, MD  pantoprazole (PROTONIX) 40 MG tablet Take 40 mg by mouth daily.      Historical Provider, MD  predniSONE (DELTASONE) 5 MG tablet Take 5 mg by mouth daily. 11/07/12   Historical Provider, MD  tiotropium (SPIRIVA) 18 MCG inhalation capsule Place 18 mcg into inhaler and inhale daily.    Historical Provider, MD    Current Facility-Administered Medications  Medication Dose Route Frequency Provider Last Rate Last Dose  . acetaminophen (TYLENOL) tablet 650 mg  650 mg Oral Q6H PRN Therisa Doyne, MD       Or  . acetaminophen (TYLENOL) suppository 650 mg  650 mg Rectal Q6H PRN Therisa Doyne, MD      . albuterol (PROVENTIL) (5 MG/ML) 0.5% nebulizer solution 2.5 mg  2.5 mg Nebulization Q4H PRN Therisa Doyne, MD      . allopurinol (ZYLOPRIM) tablet 300 mg  300 mg Oral BID Therisa Doyne, MD   300 mg at 03/03/13 2255  . amLODipine (NORVASC) tablet 10 mg  10 mg Oral Daily Therisa Doyne, MD      . atorvastatin (LIPITOR) tablet 20 mg  20 mg Oral QHS Therisa Doyne, MD   20 mg at 03/03/13 2255  . bisoprolol (ZEBETA) tablet 5 mg  5 mg Oral Daily  Therisa Doyne, MD      . Chlorhexidine Gluconate Cloth 2 % PADS 6 each  6 each Topical Q0600 Calvert Cantor, MD   6 each at 03/04/13 0521  . cloNIDine HCl (KAPVAY) ER tablet 0.1 mg  0.1 mg Oral BID Therisa Doyne, MD      . colchicine tablet 0.6 mg  0.6 mg Oral Daily Therisa Doyne, MD      . docusate sodium (COLACE) capsule 100 mg  100 mg Oral BID Therisa Doyne, MD   100 mg at 03/03/13 2256  . escitalopram (LEXAPRO) tablet 20 mg  20 mg Oral Daily Therisa Doyne, MD      . ezetimibe (ZETIA) tablet 10 mg  10 mg Oral Daily Therisa Doyne, MD      . folic acid (FOLVITE) tablet 0.5 mg  500 mcg Oral Daily Therisa Doyne, MD      . furosemide (LASIX) tablet 80 mg  80 mg Oral BID Therisa Doyne, MD      . gabapentin (NEURONTIN) capsule 100 mg  100 mg Oral QHS Therisa Doyne, MD   100 mg at 03/03/13 2255  . insulin aspart (novoLOG) injection 0-9 Units  0-9 Units Subcutaneous Q4H Therisa Doyne, MD   2 Units at 03/04/13 0520  . insulin glargine (LANTUS) injection 30 Units  30 Units Subcutaneous BID Therisa Doyne, MD   30 Units at 03/03/13 2257  . isosorbide mononitrate (IMDUR) 24 hr tablet 60 mg  60 mg Oral Daily Anastassia Doutova,  MD      . mupirocin ointment (BACTROBAN) 2 % 1 application  1 application Nasal BID Calvert Cantor, MD      . ondansetron (ZOFRAN) tablet 4 mg  4 mg Oral Q6H PRN Therisa Doyne, MD       Or  . ondansetron (ZOFRAN) injection 4 mg  4 mg Intravenous Q6H PRN Therisa Doyne, MD      . pantoprazole (PROTONIX) 80 mg in sodium chloride 0.9 % 250 mL infusion  8 mg/hr Intravenous Continuous Richardean Canal, MD 25 mL/hr at 03/03/13 2348 8 mg/hr at 03/03/13 2348  . [START ON 03/07/2013] pantoprazole (PROTONIX) injection 40 mg  40 mg Intravenous Q12H Richardean Canal, MD      . predniSONE (DELTASONE) tablet 5 mg  5 mg Oral Daily Therisa Doyne, MD   5 mg at 03/03/13 2255  . senna (SENOKOT) tablet 8.6 mg  1 tablet Oral BID Therisa Doyne, MD    8.6 mg at 03/03/13 2255  . sodium chloride 0.9 % injection 3 mL  3 mL Intravenous Q12H Therisa Doyne, MD   3 mL at 03/03/13 2256  . tiotropium (SPIRIVA) inhalation capsule 18 mcg  18 mcg Inhalation Daily Therisa Doyne, MD        Allergies as of 03/03/2013 - Review Complete 03/03/2013  Allergen Reaction Noted  . Donepezil Diarrhea 05/02/2011  . Heparin Rash and Other (See Comments)   . Hydralazine Other (See Comments) 05/02/2011  . Hydromorphone Itching 05/02/2011  . Morphine Itching   . Plavix [clopidogrel bisulfate] Other (See Comments) 05/02/2011  . Promethazine Other (See Comments) 05/02/2011  . Sulfonamide derivatives Itching 05/22/2008  . Cefuroxime Other (See Comments)   . Metoprolol Other (See Comments)   . Ertapenem Itching and Rash 05/02/2011   Review of Systems:    Positive for chronic constipation. All systems other reviewed and negative except where noted in HPI.   Physical Exam:  Vital signs in last 24 hours: Temp:  [97.9 F (36.6 C)-98.7 F (37.1 C)] 98.5 F (36.9 C) (10/11 0459) Pulse Rate:  [55-80] 62 (10/11 0459) Resp:  [17-21] 17 (10/11 0459) BP: (126-163)/(42-56) 126/51 mmHg (10/11 0459) SpO2:  [93 %-100 %] 98 % (10/11 0459) Weight:  [242 lb 4.6 oz (109.9 kg)] 242 lb 4.6 oz (109.9 kg) (10/10 2135) Last BM Date: 03/02/13 General:   Pleasant white male in NAD Head:  Normocephalic and atraumatic. Eyes:   No icterus.   Conjunctiva pink. Ears:  Normal auditory acuity. Neck:  Supple; no masses felt Lungs:  Respirations even and unlabored. Inspiratory crackles RLL.   Heart:  Regular rate and rhythm Abdomen:  Soft, protuberant, normal bowel sounds. Given protuberance it is difficult to assess for masses or hepatomegaly.  Subcutaneous edema. Rectal:  Not performed. Patient on stretcher in endoscopy suite, EGD within next few minutes.  Msk:  Symmetrical without gross deformities.  Extremities:  1-2+ BLE edema. Neurologic:  Alert and  oriented x4;   grossly normal neurologically. Skin:  Intact without significant lesions or rashes. Cervical Nodes:  No significant cervical adenopathy. Psych:  Alert and cooperative. Normal affect.  LAB RESULTS:  Recent Labs  03/03/13 1808 03/04/13 0520  WBC  --  10.5  HGB 7.8* 9.1*  HCT 23.0* 29.2*  PLT  --  229   BMET  Recent Labs  03/03/13 1742 03/03/13 1808 03/04/13 0520  NA 138 139 141  K 4.6 5.8* 4.5  CL 99 102 104  CO2 26  --  26  GLUCOSE 279* 278* 172*  BUN 56* 82* 49*  CREATININE 1.98* 2.30* 1.82*  CALCIUM 9.5  --  9.4   LFT  Recent Labs  03/04/13 0520  PROT 6.8  ALBUMIN 3.4*  AST 25  ALT 17  ALKPHOS 74  BILITOT 0.6   STUDIES: Dg Chest Port 1 View  03/03/2013   CLINICAL DATA:  Chest pain.  EXAM: PORTABLE CHEST - 1 VIEW  COMPARISON:  Single view of the chest 09/20/2012 and PA and lateral chest 02/27/2013.  FINDINGS: There is cardiomegaly and vascular congestion. No consolidative process, pneumothorax or effusion is identified.  IMPRESSION: Cardiomegaly and vascular congestion without acute disease.   Electronically Signed   By: Drusilla Kanner M.D.   On: 03/03/2013 18:09    PREVIOUS ENDOSCOPIES:             EGD October 2013 for iron deficiency anemia. Findings: bleeding angioectasia in fundus / GAVE, s/p APC.  EGD  / flex sig December 2012 for GI bleed.  Findings:  AVM in the antrum. Single 2 mm nonbleeding AVM. AVM was  obliterated utilizing the argon plasma coagulator (see image7 and  image8). There were multiple polyps identified (see image5).  There were multiple 1-2 mm sessile fundic appearing polyps in the  gastric fundus Esophagitis was found at the gastroesophageal  junction. Esophagitis with erosions was present at the GE junction  (see image11). Otherwise the examination was normal (see image1,  image2, image3, image6, and image12). Retroflexed views  revealed no abnormalities. The scope was then withdrawn from  the patient and the procedure  completed.   ENDOSCOPIC IMPRESSION:  1) AVM in the antrum  2) Polyps, multiple  3) Esophagitis at the gastroesophageal junction  4) Otherwise normal examination  The patient likely has AVMs tract causing intermittent bleeding.   Flex sig-negative.   Colonoscopy / EGD 2009. FINDINGS: Blood was found throughout the colon. There was fresh red blood and small clots throughout colon. Seemed most prominant in right colon. There were several right sided colonic diverticulum. No obvious source of the bleeding was identified. I attempted to get into the terminal ileum but was not successful (see image002, image003, image004, and image005). The examination was otherwise normal. Retroflexed views in the rectum revealed no abnormalities. The scope was then withdrawn from the patient and the procedure completed.   ENDOSCOPIC IMPRESSION:  1) Blood throughout the colon, right sided diverticulum  2) Otherwise normal examination  Fresh red blood and right sided colonic diverticulum. This is probably a diverticular bleed, could not localize exact source however. Will proceed with EGD now to make sure this is not an UBI bleed before recommending further tests.  RECOMMENDATIONS:  EGD   ENDOSCOPIC IMPRESSION:  1) Hiatal hernia  No UGI bleeding to account for his rectal bleeding. Plavix and ASA likely contribute to his bleeding but he's had very recent coronary stenting  EGD / colonoscopy (Dr. Marina Goodell, Outpatient) 2007 for abdominal pain. Findings  - Normal: Proximal Esophagus to Distal Esophagus. Comments: No  inflammation or Barrett's.  HIATAL HERNIA:  - Normal: Cardia to Duodenal 2nd Portion. Comments: few tiny polyps  noted.   Colonoscopy for diarrhea. Findings  NORMAL EXAM: Cecum to Rectum.  MULTIPLE POLYPS: Cecum to Transverse Colon. minimum size 1 mm,  maximum size 7 mm. Procedure: snare without cautery, removed, Polyp  retrieved, 4 polyps Polyps sent to pathology. ICD9: Colon Polyps:  211.3.  Comments: 5 seen and removed (1mm cecal polyp not retrieved).  3 removed on way  to cecum.  - DIVERTICULOSIS: Descending Colon to Sigmoid Colon. ICD9:  Diverticulosis, Colon: 562.10.    Impression / Plan:   42. 79 year old male with multiple significant medical problems, on multiple medications at home.  2.  Recurrent anemia in setting of recent one week history of black stools. Suspect recurrent bleeding from intestinal AVMs, Will proceed with EGD this am. Continue PPI drip for now.   3. Acute on chronic anemia. Hgb up to 9.1 after two units of blood.   4. History of adenomatous colon polyps.   5. History of diverticulosis.   Thanks   LOS: 1 day   Willette Cluster  03/04/2013, 8:52 AM  Chart was reviewed and patient was examined. X-rays and lab were reviewed.    I agree with management and plans.  Patient presents with subacute GI bleeding and symptomatic anemia.  Suspect that he has recurrent AVMs.  Plan to proceed with upper endoscopy while continuing Protonix drip.  Barbette Hair. Arlyce Dice, M.D., [facility] Cell [phone]

## 2013-03-04 NOTE — Progress Notes (Signed)
PATIENT DETAILS Name: [name] Age: 79 y.o. Sex: male Date of Birth: 1934/03/26 Admit Date: 03/03/2013 Admitting Physician Therisa Doyne, MD ZOX:[id number] [name], MD  Subjective: No major complaints overnight. Admitted with exertional dyspnea, has been having black tarry stools for the past few weeks.  Assessment/Plan: Active Problems: Suspected upper GI bleed - Admitted with black stools, Hemoccult positive- known history of AVMs. - On Protonix infusion - Await GI consultation, keep n.p.o. for potential EGD later today  Anemia - Secondary to acute blood loss - Given 2 units of PRBC transfusion on 10/10. Hemoglobin up to 9.1 today - Await EGD/GI evaluation - Monitor H&H periodically, last movement was more than 2 days back  Acute on chronic kidney disease stage IV - Creatinine back to usual baseline, monitor electrolytes periodically. - Suspect acute renal failure secondary to GI loss.  Exertional dyspnea - Suspect this is secondary to symptomatic anemia. - Continue to monitor H&H, continue Lasix 80 mg twice a day - Last echocardiogram on 01/01/12 showed EF around 65-70% with grade 2 diastolic dysfunction  History of paroxysmal atrial fibrillation - Currently in sinus rhythm - Suspect not a candidate for anticoagulation given history of GI bleed and AVMs - Was on aspirin as an outpatient, currently on hold given active GI bleed - Continue with bisoprolol for rate control  Hypertension - Stable with a bisoprolol, clonidine, amlodipine and Lasix.    DM - CBGs relatively stable. - Continue Lantus 30 units twice daily and sliding scale insulin - As diet advances, will need to increase Lantus to home dosing of 59 units twice daily.    Obstructive sleep apnea - Continue with CPAP  History of diastolic heart failure - Continue with Lasix and beta blocker - Clinically compensated on exam today  History of CAD status post CABG - Holding aspirin for now -  And then he would bisoprolol and statins  History of chronic bronchitis - Continue with Spiriva - We'll start scheduled nebulized bronchodilators  History of gout - Apparently on chronic maintenance prednisone of 5 mg a day for this - And continue with allopurinol" itching  History of dementia - Resume Namenda when able  Disposition: Remain inpatient  DVT Prophylaxis:  SCD's  Code Status: Full code   Family Communication None at bedside.  Procedures:  None  CONSULTS:  GI   MEDICATIONS: Scheduled Meds: . [facility] HOLD] allopurinol  300 mg Oral BID  . [MAR HOLD] amLODipine  10 mg Oral Daily  . [facility] HOLD] atorvastatin  20 mg Oral QHS  . [MAR HOLD] bisoprolol  5 mg Oral Daily  . [MAR HOLD] Chlorhexidine Gluconate Cloth  6 each Topical Q0600  . [facility] HOLD] cloNIDine HCl  0.1 mg Oral BID  . [facility] HOLD] colchicine  0.6 mg Oral Daily  . [facility] HOLD] docusate sodium  100 mg Oral BID  . [MAR HOLD] escitalopram  20 mg Oral Daily  . [facility] HOLD] ezetimibe  10 mg Oral Daily  . [facility] HOLD] folic acid  500 mcg Oral Daily  . [facility] HOLD] furosemide  80 mg Oral BID  . [facility] HOLD] gabapentin  100 mg Oral QHS  . [MAR HOLD] insulin aspart  0-9 Units Subcutaneous Q4H  . [MAR HOLD] insulin glargine  30 Units Subcutaneous BID  . [facility] HOLD] isosorbide mononitrate  60 mg Oral Daily  . [facility] HOLD] mupirocin ointment  1 application Nasal BID  . [MAR HOLD] pantoprazole (PROTONIX) IV  40 mg Intravenous Q12H  . [facility] HOLD]  predniSONE  5 mg Oral Daily  . [MAR HOLD] senna  1 tablet Oral BID  . [MAR HOLD] sodium chloride  3 mL Intravenous Q12H  . [MAR HOLD] tiotropium  18 mcg Inhalation Daily   Continuous Infusions: . sodium chloride    . pantoprozole (PROTONIX) infusion 8 mg/hr (03/04/13 0943)   PRN Meds:.[[facility] HOLD] acetaminophen, [MAR HOLD] acetaminophen, [MAR HOLD] albuterol, [MAR HOLD] ondansetron (ZOFRAN) IV, [MAR HOLD] ondansetron  Antibiotics: Anti-infectives   None       PHYSICAL  EXAM: Vital signs in last 24 hours: Filed Vitals:   03/04/13 0900 03/04/13 0904 03/04/13 1003 03/04/13 1009  BP: 124/54   154/60  Pulse: 57  62 63  Temp: 98.2 F (36.8 C)     TempSrc: Oral     Resp: 15   19  Height:      Weight:      SpO2: 93% 90%  93%    Weight change:  Filed Weights   03/03/13 2135  Weight: 109.9 kg (242 lb 4.6 oz)   Body mass index is 36.85 kg/(m^2).   Gen Exam: Awake and alert with clear speech.   Neck: Supple, No JVD.   Chest: Fine bibasilar rales CVS: S1 S2 Regular, no murmurs.  Abdomen: soft, BS +, non tender, non distended.  Extremities:trace edema, lower extremities warm to touch. Neurologic: Non Focal.   Skin: No Rash.   Wounds: N/A.    Intake/Output from previous day:  Intake/Output Summary (Last 24 hours) at 03/04/13 1017 Last data filed at 03/04/13 0800  Gross per 24 hour  Intake    905 ml  Output   1075 ml  Net   -170 ml     LAB RESULTS: CBC  Recent Labs Lab 03/03/13 1808 03/04/13 0520  WBC  --  10.5  HGB 7.8* 9.1*  HCT 23.0* 29.2*  PLT  --  229  MCV  --  85.9  MCH  --  26.8  MCHC  --  31.2  RDW  --  18.2*    Chemistries   Recent Labs Lab 03/03/13 1742 03/03/13 1808 03/04/13 0520  NA 138 139 141  K 4.6 5.8* 4.5  CL 99 102 104  CO2 26  --  26  GLUCOSE 279* 278* 172*  BUN 56* 82* 49*  CREATININE 1.98* 2.30* 1.82*  CALCIUM 9.5  --  9.4  MG  --   --  2.1    CBG:  Recent Labs Lab 03/03/13 2253 03/04/13 0459 03/04/13 0900  GLUCAP 141* 171* 167*    GFR Estimated Creatinine Clearance: 40.9 ml/min (by C-G formula based on Cr of 1.82).  Coagulation profile No results found for this basename: INR, PROTIME,  in the last 168 hours  Cardiac Enzymes  Recent Labs Lab 03/04/13 0530  TROPONINI <0.30    No components found with this basename: POCBNP,  No results found for this basename: DDIMER,  in the last 72 hours No results found for this basename: HGBA1C,  in the last 72 hours No results found for  this basename: CHOL, HDL, LDLCALC, TRIG, CHOLHDL, LDLDIRECT,  in the last 72 hours No results found for this basename: TSH, T4TOTAL, FREET3, T3FREE, THYROIDAB,  in the last 72 hours No results found for this basename: VITAMINB12, FOLATE, FERRITIN, TIBC, IRON, RETICCTPCT,  in the last 72 hours No results found for this basename: LIPASE, AMYLASE,  in the last 72 hours  Urine Studies No results found for this basename: UACOL, UAPR, USPG, UPH,  UTP, UGL, UKET, UBIL, UHGB, UNIT, UROB, ULEU, UEPI, UWBC, URBC, UBAC, CAST, CRYS, UCOM, BILUA,  in the last 72 hours  MICROBIOLOGY: Recent Results (from the past 240 hour(s))  MRSA PCR SCREENING     Status: Abnormal   Collection Time    03/03/13  9:35 PM      Result Value Range Status   MRSA by PCR POSITIVE (*) NEGATIVE Final   Comment:            The GeneXpert MRSA Assay (FDA     approved for NASAL specimens     only), is one component of a     comprehensive MRSA colonization     surveillance program. It is not     intended to diagnose MRSA     infection nor to guide or     monitor treatment for     MRSA infections.     RESULT CALLED TO, READ BACK BY AND VERIFIED WITH:     M.PIEL RN AT 0117 03/04/13 BY L.PITT    RADIOLOGY STUDIES/RESULTS: Dg Chest 2 View  02/27/2013   CLINICAL DATA:  Chest pain and shortness of breath. Dizziness for 10 days.  EXAM: CHEST  2 VIEW  COMPARISON:  09/20/2012  FINDINGS: Mild hyperinflation. Prior median sternotomy. Midline trachea. Mild cardiomegaly. Mild bilateral apparent pleural thickening, favored to be related to extrapleural fat prominence. Present on 07/11/2012. Clear lungs. No congestive failure.  IMPRESSION: Cardiomegaly and hyperinflation, without congestive failure or acute superimposed process.   Electronically Signed   By: Jeronimo Greaves M.D.   On: 02/27/2013 15:02   Dg Chest Port 1 View  03/03/2013   CLINICAL DATA:  Chest pain.  EXAM: PORTABLE CHEST - 1 VIEW  COMPARISON:  Single view of the chest  09/20/2012 and PA and lateral chest 02/27/2013.  FINDINGS: There is cardiomegaly and vascular congestion. No consolidative process, pneumothorax or effusion is identified.  IMPRESSION: Cardiomegaly and vascular congestion without acute disease.   Electronically Signed   By: Drusilla Kanner M.D.   On: 03/03/2013 18:09    Jeoffrey Massed, MD  [facility] Pager:[phone]  If 7PM-7AM, please contact night-coverage [email or web address] Password [email or web address] 03/04/2013, 10:17 AM   LOS: 1 day

## 2013-03-05 ENCOUNTER — Encounter (HOSPITAL_COMMUNITY): Payer: Self-pay | Admitting: *Deleted

## 2013-03-05 ENCOUNTER — Encounter (HOSPITAL_COMMUNITY)
Admission: EM | Disposition: A | Discharge status: Home / Self Care | Payer: BC Managed Care – PPO | Source: Home / Self Care | Attending: Internal Medicine

## 2013-03-05 DIAGNOSIS — D126 Benign neoplasm of colon, unspecified: Secondary | ICD-10-CM

## 2013-03-05 DIAGNOSIS — K5521 Angiodysplasia of colon with hemorrhage: Secondary | ICD-10-CM

## 2013-03-05 DIAGNOSIS — K5730 Diverticulosis of large intestine without perforation or abscess without bleeding: Secondary | ICD-10-CM

## 2013-03-05 DIAGNOSIS — I4891 Unspecified atrial fibrillation: Secondary | ICD-10-CM

## 2013-03-05 DIAGNOSIS — K648 Other hemorrhoids: Secondary | ICD-10-CM

## 2013-03-05 HISTORY — PX: COLONOSCOPY: SHX5424

## 2013-03-05 LAB — GLUCOSE, CAPILLARY
Glucose-Capillary: 184 mg/dL — ABNORMAL HIGH (ref 70–99)
Glucose-Capillary: 196 mg/dL — ABNORMAL HIGH (ref 70–99)
Glucose-Capillary: 184 mg/dL — ABNORMAL HIGH (ref 70–99)

## 2013-03-05 LAB — CBC
MCH: 27 pg (ref 26.0–34.0)
Platelets: 215 10*3/uL (ref 150–400)
RBC: 3.15 MIL/uL — ABNORMAL LOW (ref 4.22–5.81)
WBC: 8.7 10*3/uL (ref 4.0–10.5)

## 2013-03-05 LAB — BASIC METABOLIC PANEL
BUN: 42 mg/dL — ABNORMAL HIGH (ref 6–23)
CO2: 26 mEq/L (ref 19–32)
Calcium: 8.9 mg/dL (ref 8.4–10.5)
Chloride: 105 mEq/L (ref 96–112)
Sodium: 141 mEq/L (ref 135–145)

## 2013-03-05 SURGERY — COLONOSCOPY
Anesthesia: Moderate Sedation

## 2013-03-05 MED ORDER — MIDAZOLAM HCL 5 MG/5ML IJ SOLN
INTRAMUSCULAR | Status: DC | PRN
  Administered 2013-03-05 (×3): 1 mg via INTRAVENOUS

## 2013-03-05 MED ORDER — METOLAZONE 2.5 MG PO TABS
2.5000 mg | ORAL_TABLET | Freq: Every day | ORAL | Status: DC
  Administered 2013-03-05 – 2013-03-06 (×2): 2.5 mg via ORAL
  Filled 2013-03-05 (×2): qty 1

## 2013-03-05 MED ORDER — INSULIN ASPART 100 UNIT/ML ~~LOC~~ SOLN: 0.0000 [IU] | Freq: Every day | SUBCUTANEOUS | Status: DC

## 2013-03-05 MED ORDER — MEMANTINE HCL 10 MG PO TABS
10.0000 mg | ORAL_TABLET | Freq: Two times a day (BID) | ORAL | Status: DC
  Administered 2013-03-05 – 2013-03-06 (×3): 10 mg via ORAL
  Filled 2013-03-05 (×4): qty 1

## 2013-03-05 MED ORDER — INSULIN ASPART 100 UNIT/ML ~~LOC~~ SOLN
0.0000 [IU] | Freq: Three times a day (TID) | SUBCUTANEOUS | Status: DC
  Administered 2013-03-06: 2 [IU] via SUBCUTANEOUS

## 2013-03-05 MED ORDER — PANTOPRAZOLE SODIUM 40 MG IV SOLR
40.0000 mg | Freq: Two times a day (BID) | INTRAVENOUS | Status: DC
  Administered 2013-03-05 – 2013-03-06 (×2): 40 mg via INTRAVENOUS
  Filled 2013-03-05 (×3): qty 40

## 2013-03-05 MED ORDER — ALBUTEROL SULFATE (5 MG/ML) 0.5% IN NEBU
2.5000 mg | INHALATION_SOLUTION | Freq: Four times a day (QID) | RESPIRATORY_TRACT | Status: DC
  Administered 2013-03-05 – 2013-03-06 (×4): 2.5 mg via RESPIRATORY_TRACT
  Filled 2013-03-05 (×5): qty 0.5

## 2013-03-05 MED ORDER — FENTANYL CITRATE 0.05 MG/ML IJ SOLN
INTRAMUSCULAR | Status: AC
  Filled 2013-03-05: qty 2

## 2013-03-05 MED ORDER — MIDAZOLAM HCL 5 MG/ML IJ SOLN
INTRAMUSCULAR | Status: AC
  Filled 2013-03-05: qty 2

## 2013-03-05 MED ORDER — FENTANYL CITRATE 0.05 MG/ML IJ SOLN
INTRAMUSCULAR | Status: DC | PRN
  Administered 2013-03-05 (×2): 12.5 ug via INTRAVENOUS
  Administered 2013-03-05: 25 ug via INTRAVENOUS
  Administered 2013-03-05: 20 ug via INTRAVENOUS

## 2013-03-05 NOTE — Interval H&P Note (Signed)
History and Physical Interval Note:  03/05/2013 11:49 AM  [name]  has presented today for surgery, with the diagnosis of GIB  The various methods of treatment have been discussed with the patient and family. After consideration of risks, benefits and other options for treatment, the patient has consented to  Procedure(s): COLONOSCOPY (N/A) as a surgical intervention .  The patient's history has been reviewed, patient examined, no change in status, stable for surgery.  I have reviewed the patient's chart and labs.  Questions were answered to the patient's satisfaction.     The recent H&P (dated **03/04/13*) was reviewed, the patient was examined and there is no change in the patients condition since that H&P was completed.   [name]  03/05/2013, 11:49 AM   [name]

## 2013-03-05 NOTE — Progress Notes (Signed)
See colonoscopy report.  He has a large cecal AVM which was cauterized.  This  is probable source for bleeding. Multiple colon polyps were removed.  Recommendations #1 regular diet #2 if stable  over next 24 hours can be discharged

## 2013-03-05 NOTE — Progress Notes (Signed)
Utilization Review Completed.[name] T10/04/2013  

## 2013-03-05 NOTE — H&P (View-Only) (Signed)
Endoscopy demonstrated multiple gastric polyps.  2 friable polyps were cauterized.  Definitive bleeding source was not established by this exam.  Plan colonoscopy in a.m. 

## 2013-03-05 NOTE — Progress Notes (Signed)
RT placed patient on his home CPAP unit.  Patient is tolerating well at this time.

## 2013-03-05 NOTE — Op Note (Signed)
[facility] [address] [place], [address]   COLONOSCOPY PROCEDURE REPORT  PATIENT: [name], [name].  MR#: [id number] BIRTHDATE: July 21, 1933 , 79  yrs. old GENDER: Male ENDOSCOPIST: Louis Meckel, MD REFERRED [id number] Maple Hudson, M.D.  Roxy Cedar, M.D. PROCEDURE DATE:  03/05/2013 PROCEDURE:   Colonoscopy with snare polypectomy, Colonoscopy with cold biopsy polypectomy, and Colonoscopy with control of bleeding  ASA CLASS:   Class III INDICATIONS:melena. endoscopy not diagnostic for GI bleeding source  MEDICATIONS: These medications were titrated to patient response per physician's verbal order, Versed 3 mg IV, and Fentanyl-Detailed 70 mcg IV  DESCRIPTION OF PROCEDURE:   After the risks benefits and alternatives of the procedure were thoroughly explained, informed consent was obtained.  A digital rectal exam revealed no abnormalities of the rectum.   The Pentax Adult Colon [id number] endoscope was introduced through the anus and advanced to the cecum, which was identified by both the appendix and ileocecal valve. No adverse events experienced.   The quality of the prep was fair.  The instrument was then slowly withdrawn as the colon was fully examined.      COLON FINDINGS: Diverticulosis was noted.   A single 3 mm AVM was present in the cecum.  This was cauterized with the argon plasma coagulator. 4 discrete polyps were seen in the cecum measuring 3-5 mm.  Each was removed with cold polypectomy snare and submitted to pathology. In the distal transverse colon/splenic flexure there were 3 polyps. 2 measured 3-4 mm and were  removed with cold polypectomy snare and submitted to pathology.  A single 2 mm polyp was removed with cold biopsy forceps. There was mild diverticulosis in the sigmoid colon.   Retroflexed views revealed internal hemorrhoids. The time to cecum=  . Withdrawal time=25 minutes 0 seconds.  The scope was withdrawn and the  procedure completed. COMPLICATIONS: There were no complications.  ENDOSCOPIC IMPRESSION: 1.  large cecal AVM-status post cauterization 2.  colonic polyposis 3.  diverticulosis 4.  internal hemorrhoids  Melena very likely was due to  bleeding from the cecal AVM  RECOMMENDATIONS: resume diet Avoid aspirin and NSAIDs for 2 weeks  eSigned:  Louis Meckel, MD 03/05/2013 12:50 PM   cc:   PATIENT NAME:  [name], [name]. MR#: [id number]

## 2013-03-05 NOTE — Progress Notes (Signed)
PATIENT DETAILS Name: [name] Age: 79 y.o. Sex: male Date of Birth: 07/14/1933 Admit Date: 03/03/2013 Admitting Physician Therisa Doyne, MD ION:[id number] [name], MD  Subjective: No melanotic stools since admission, no complaints this am  Assessment/Plan: Active Problems: Suspected upper GI bleed - Admitted with black stools, Hemoccult positive- known history of AVMs.However no melanotic stools since admission. - On Protonix infusion, suspect we could transition to PPI BID - GI consultation obtained, EGD on 10/11-did not show any definite source of bleeding, two friable polyps were seen in the gastric fundus which were cauterized. Plan for Colonoscopy on 10/12.   Anemia - Secondary to acute blood loss - Given 2 units of PRBC transfusion on 10/10. Hemoglobin stable at 8.5 today - Monitor H&H periodically  Acute on chronic kidney disease stage IV - Creatinine back to usual baseline, monitor electrolytes periodically. - Suspect acute renal failure secondary to GI loss.  Exertional dyspnea - Suspect this is secondary to symptomatic anemia. - Continue to monitor H&H, continue Lasix 80 mg twice a day - Last echocardiogram on 01/01/12 showed EF around 65-70% with grade 2 diastolic dysfunction  History of paroxysmal atrial fibrillation - Currently in sinus rhythm - Suspect not a candidate for anticoagulation given history of GI bleed and AVMs - Was on aspirin as an outpatient, currently on hold given active GI bleed - Continue with bisoprolol for rate control  Hypertension - Stable with a bisoprolol, clonidine, amlodipine and Lasix.    DM - CBGs relatively stable. - Continue Lantus 30 units twice daily and sliding scale insulin - As diet advances, will need to increase Lantus to home dosing of 59 units twice daily.    Obstructive sleep apnea - Continue with CPAP  History of diastolic heart failure - Continue with Lasix and beta blocker - Clinically  compensated on exam today, however weight increasing, will resume metalozone  History of CAD status post CABG - Holding aspirin for now - And then he would bisoprolol and statins  History of chronic bronchitis - Continue with Spiriva - We'll start scheduled nebulized bronchodilators  History of gout - Apparently on chronic maintenance prednisone of 5 mg a day for this - And continue with allopurinol and colcichine  History of dementia - Resume Namenda  Disposition: Remain inpatient  DVT Prophylaxis:  SCD's  Code Status: Full code   Family Communication None at bedside.  Procedures:  None  CONSULTS:  GI   MEDICATIONS: Scheduled Meds: . albuterol  2.5 mg Nebulization QID  . allopurinol  300 mg Oral BID  . amLODipine  10 mg Oral Daily  . atorvastatin  20 mg Oral QHS  . bisoprolol  5 mg Oral Daily  . Chlorhexidine Gluconate Cloth  6 each Topical Q0600  . cloNIDine HCl  0.1 mg Oral BID  . colchicine  0.6 mg Oral Daily  . docusate sodium  100 mg Oral BID  . escitalopram  20 mg Oral Daily  . ezetimibe  10 mg Oral Daily  . folic acid  500 mcg Oral Daily  . furosemide  80 mg Oral BID  . gabapentin  100 mg Oral QHS  . insulin aspart  0-9 Units Subcutaneous Q4H  . insulin glargine  30 Units Subcutaneous BID  . isosorbide mononitrate  60 mg Oral Daily  . mupirocin ointment  1 application Nasal BID  . [START ON 03/07/2013] pantoprazole (PROTONIX) IV  40 mg Intravenous Q12H  . predniSONE  5 mg Oral Daily  . senna  1  tablet Oral BID  . sodium chloride  3 mL Intravenous Q12H  . tiotropium  18 mcg Inhalation Daily   Continuous Infusions: . sodium chloride 20 mL/hr at 03/04/13 1328  . pantoprozole (PROTONIX) infusion 8 mg/hr (03/04/13 2009)   PRN Meds:.acetaminophen, acetaminophen, albuterol, ondansetron (ZOFRAN) IV, ondansetron  Antibiotics: Anti-infectives   None       PHYSICAL EXAM: Vital signs in last 24 hours: Filed Vitals:   03/05/13 0019 03/05/13  0500 03/05/13 0621 03/05/13 0801  BP: 141/49  142/60   Pulse: 55  56   Temp: 98.7 F (37.1 C)  97.3 F (36.3 C) 98.6 F (37 C)  TempSrc: Axillary  Axillary Oral  Resp: 19  22   Height:      Weight:  111.2 kg (245 lb 2.4 oz)    SpO2: 91%  96%     Weight change: 1.3 kg (2 lb 13.9 oz) Filed Weights   03/03/13 2135 03/05/13 0500  Weight: 109.9 kg (242 lb 4.6 oz) 111.2 kg (245 lb 2.4 oz)   Body mass index is 37.28 kg/(m^2).   Gen Exam: Awake and alert with clear speech.   Neck: Supple, No JVD.   Chest: Fine bibasilar rales CVS: S1 S2 Regular, no murmurs.  Abdomen: soft, BS +, non tender, non distended.  Extremities:trace edema, lower extremities warm to touch. Neurologic: Non Focal.   Skin: No Rash.   Wounds: N/A.    Intake/Output from previous day:  Intake/Output Summary (Last 24 hours) at 03/05/13 0812 Last data filed at 03/05/13 0700  Gross per 24 hour  Intake   1035 ml  Output   1050 ml  Net    -15 ml     LAB RESULTS: CBC  Recent Labs Lab 03/03/13 1808 03/04/13 0520 03/05/13 0340  WBC  --  10.5 8.7  HGB 7.8* 9.1* 8.5*  HCT 23.0* 29.2* 27.2*  PLT  --  229 215  MCV  --  85.9 86.3  MCH  --  26.8 27.0  MCHC  --  31.2 31.3  RDW  --  18.2* 18.7*    Chemistries   Recent Labs Lab 03/03/13 1742 03/03/13 1808 03/04/13 0520 03/05/13 0340  NA 138 139 141 141  K 4.6 5.8* 4.5 3.9  CL 99 102 104 105  CO2 26  --  26 26  GLUCOSE 279* 278* 172* 115*  BUN 56* 82* 49* 42*  CREATININE 1.98* 2.30* 1.82* 1.80*  CALCIUM 9.5  --  9.4 8.9  MG  --   --  2.1  --     CBG:  Recent Labs Lab 03/04/13 1251 03/04/13 1640 03/04/13 1936 03/05/13 0022 03/05/13 0508  GLUCAP 142* 225* 250* 184* 103*    GFR Estimated Creatinine Clearance: 41.6 ml/min (by C-G formula based on Cr of 1.8).  Coagulation profile No results found for this basename: INR, PROTIME,  in the last 168 hours  Cardiac Enzymes  Recent Labs Lab 03/04/13 0530 03/04/13 1200  TROPONINI  <0.30 <0.30    No components found with this basename: POCBNP,  No results found for this basename: DDIMER,  in the last 72 hours No results found for this basename: HGBA1C,  in the last 72 hours No results found for this basename: CHOL, HDL, LDLCALC, TRIG, CHOLHDL, LDLDIRECT,  in the last 72 hours  Recent Labs  03/04/13 0520  TSH 2.561   No results found for this basename: VITAMINB12, FOLATE, FERRITIN, TIBC, IRON, RETICCTPCT,  in the last 72 hours No  results found for this basename: LIPASE, AMYLASE,  in the last 72 hours  Urine Studies No results found for this basename: UACOL, UAPR, USPG, UPH, UTP, UGL, UKET, UBIL, UHGB, UNIT, UROB, ULEU, UEPI, UWBC, URBC, UBAC, CAST, CRYS, UCOM, BILUA,  in the last 72 hours  MICROBIOLOGY: Recent Results (from the past 240 hour(s))  MRSA PCR SCREENING     Status: Abnormal   Collection Time    03/03/13  9:35 PM      Result Value Range Status   MRSA by PCR POSITIVE (*) NEGATIVE Final   Comment:            The GeneXpert MRSA Assay (FDA     approved for NASAL specimens     only), is one component of a     comprehensive MRSA colonization     surveillance program. It is not     intended to diagnose MRSA     infection nor to guide or     monitor treatment for     MRSA infections.     RESULT CALLED TO, READ BACK BY AND VERIFIED WITH:     M.PIEL RN AT 0117 03/04/13 BY L.PITT    RADIOLOGY STUDIES/RESULTS: Dg Chest 2 View  02/27/2013   CLINICAL DATA:  Chest pain and shortness of breath. Dizziness for 10 days.  EXAM: CHEST  2 VIEW  COMPARISON:  09/20/2012  FINDINGS: Mild hyperinflation. Prior median sternotomy. Midline trachea. Mild cardiomegaly. Mild bilateral apparent pleural thickening, favored to be related to extrapleural fat prominence. Present on 07/11/2012. Clear lungs. No congestive failure.  IMPRESSION: Cardiomegaly and hyperinflation, without congestive failure or acute superimposed process.   Electronically Signed   By: Jeronimo Greaves M.D.    On: 02/27/2013 15:02   Dg Chest Port 1 View  03/03/2013   CLINICAL DATA:  Chest pain.  EXAM: PORTABLE CHEST - 1 VIEW  COMPARISON:  Single view of the chest 09/20/2012 and PA and lateral chest 02/27/2013.  FINDINGS: There is cardiomegaly and vascular congestion. No consolidative process, pneumothorax or effusion is identified.  IMPRESSION: Cardiomegaly and vascular congestion without acute disease.   Electronically Signed   By: Drusilla Kanner M.D.   On: 03/03/2013 18:09    Jeoffrey Massed, MD  [facility] Pager:[phone]  If 7PM-7AM, please contact night-coverage [email or web address] Password [email or web address] 03/05/2013, 8:12 AM   LOS: 2 days

## 2013-03-06 ENCOUNTER — Encounter (HOSPITAL_COMMUNITY): Payer: Self-pay | Admitting: Gastroenterology

## 2013-03-06 DIAGNOSIS — Q2733 Arteriovenous malformation of digestive system vessel: Secondary | ICD-10-CM

## 2013-03-06 LAB — CBC
HCT: 28.8 % — ABNORMAL LOW (ref 39.0–52.0)
Hemoglobin: 8.7 g/dL — ABNORMAL LOW (ref 13.0–17.0)
MCH: 26.3 pg (ref 26.0–34.0)
MCHC: 30.2 g/dL (ref 30.0–36.0)
MCV: 87 fL (ref 78.0–100.0)
Platelets: 229 10*3/uL (ref 150–400)
RBC: 3.31 MIL/uL — ABNORMAL LOW (ref 4.22–5.81)
WBC: 9.9 10*3/uL (ref 4.0–10.5)

## 2013-03-06 LAB — BASIC METABOLIC PANEL
BUN: 41 mg/dL — ABNORMAL HIGH (ref 6–23)
CO2: 28 mEq/L (ref 19–32)
Calcium: 9.2 mg/dL (ref 8.4–10.5)
Chloride: 99 mEq/L (ref 96–112)
Creatinine, Ser: 2.1 mg/dL — ABNORMAL HIGH (ref 0.50–1.35)
GFR calc Af Amer: 33 mL/min — ABNORMAL LOW (ref >90)
GFR calc non Af Amer: 29 mL/min — ABNORMAL LOW (ref >90)
Glucose, Bld: 122 mg/dL — ABNORMAL HIGH (ref 70–99)
Potassium: 3.5 mEq/L (ref 3.5–5.1)
Sodium: 139 mEq/L (ref 135–145)

## 2013-03-06 LAB — GLUCOSE, CAPILLARY: Glucose-Capillary: 120 mg/dL — ABNORMAL HIGH (ref 70–99)

## 2013-03-06 MED ORDER — FERROUS SULFATE 325 (65 FE) MG PO TABS: 325.0000 mg | ORAL_TABLET | Freq: Every day | ORAL | Status: DC

## 2013-03-06 MED ORDER — ASPIRIN EC 325 MG PO TBEC: 325.0000 mg | DELAYED_RELEASE_TABLET | Freq: Every day | ORAL | Status: DC

## 2013-03-06 MED ORDER — PANTOPRAZOLE SODIUM 40 MG PO TBEC
40.0000 mg | DELAYED_RELEASE_TABLET | Freq: Every day | ORAL | Status: DC
  Administered 2013-03-06: 40 mg via ORAL

## 2013-03-06 MED ORDER — FE FUMARATE-B12-VIT C-FA-IFC PO CAPS
1.0000 | ORAL_CAPSULE | Freq: Three times a day (TID) | ORAL | Status: DC
  Administered 2013-03-06 (×2): 1 via ORAL
  Filled 2013-03-06 (×4): qty 1

## 2013-03-06 NOTE — Discharge Summary (Signed)
Physician Discharge Summary  [name] ZOX:[id number] DOB: 23-Mar-1934 DOA: 03/03/2013  PCP: Kaleen Mask, MD  Admit date: 03/03/2013 Discharge date: 03/06/2013  Time spent: 55 minutes  Recommendations for Outpatient Follow-up:  Follow up with Dr. Marina Goodell in two weeks Post GI bleed and Cecal AVM APC.  Follow up with Dr. Jeannetta Nap regarding Oxygen requirements, CHF, HTN, DM. PCP to refer to pulmonology (Dr. Maple Hudson) at their discretion. Please check CBC at next visit  Discharge Diagnoses:  Active Problems:   DM   Obstructive sleep apnea   GI BLEED, 12/12- gastric AVM ablated   HTN (hypertension)   Chronic renal insufficiency, stage III (moderate) - Baseline creatinine roughly 1.6   Diastolic dysfunction, - grade 2 by echo 01/01/12   CAD (coronary artery disease)   Anemia   Shortness of breath   Paroxysmal atrial fibrillation   Acute posthemorrhagic anemia   Benign neoplasm of stomach   Benign neoplasm of colon   Arteriovenous malformation of colon with hemorrhage   Diverticulosis of colon (without mention of hemorrhage)   Internal hemorrhoids without mention of complication   Discharge Condition: Stable, requires oxygen 3L continuous.  Diet recommendation: Low salt  Filed Weights   03/03/13 2135 03/05/13 0500 03/06/13 0338  Weight: 109.9 kg (242 lb 4.6 oz) 111.2 kg (245 lb 2.4 oz) 109.5 kg (241 lb 6.5 oz)    History of present illness at the time of admission: [name] is a 79 y.o. male with a complex past medical history presented with 6 weeks of have dark stools which he attributed to Namenda that he was started on 6 weeks prior.  He is chronically on Aspirin 325 mg a day and prednisone 5 mg a day as well as lexapro.  For the past 1 week he has had worsening shortness of breath and chest pain with exertion. This goes away with rest. He went to his PCP 4 days ago and his hg was 8.1 which is down from 12 back in April. He was supposed to go to Hematology to have  this followed up, as he has had Iron infusions in the past. He became very symptomatic and came to ER instead where he was transfused 1 unit of blood.   Hospital Course:   Suspected upper GI bleed  - Admitted with black stools, Hemoccult positive.  No melanotic stools since admission.  - Received protonix infusion, after procedures was progressed to oral protonix. -  GI, Dr. Arlyce Dice performed EGD on 10/11 and Colonoscopy on 10/12.  -EGD on 10/11-did not show any definite source of bleeding, two friable polyps were seen in the gastric fundus which were cauterized -Colonoscopy on 10/12 showed-Cecal AVMs were found and cauterized.  They were felt to be the source of bleeding.  Anemia  - Secondary to acute blood loss  - Given 2 units of PRBC transfusion on 10/10. Hemoglobin stable at 8.7 on discharge. - Aspirin will be held for two weeks and then resumed on 10/26.Marland Kitchen  Acute on chronic kidney disease stage IV  - Creatinine back to usual baseline, monitor electrolytes periodically.  - Suspect acute renal failure secondary to GI loss.   Exertional dyspnea  - Suspect this is secondary to symptomatic anemia and mild pulm vasc overload. - Will be discharged on diuretics and continuous oxygen with close follow up.  - Last echocardiogram on 01/01/12 showed EF around 65-70% with grade 2 diastolic dysfunction   History of paroxysmal atrial fibrillation  - Currently in and out of  afib.  Rate controlled. - Not a candidate for anticoagulation given history of GI bleed and AVMs  - Was on aspirin as an outpatient, currently on hold given active GI bleed.  Resume 10/26. - Continue with bisoprolol for rate control   Hypertension  - Stable with a bisoprolol, clonidine, amlodipine and Lasix.   DM  - CBGs relatively stable.  - Resume home dosing of insulin at discharge.  Obstructive sleep apnea  - Continue with CPAP at night.  History of diastolic heart failure  - Continue with Lasix and beta  blocker  - Clinically compensated on exam today, however weight increasing, will resume metalozone  - Started on oxygen 3-4 liters continuous.  Desats to 87% when oxygen is removed.  History of CAD status post CABG  - Holding aspirin for now  - Stable. - on statin and BB.  History of chronic bronchitis  - Continue with Spiriva  - He has albuterol inhaler and nebulizers at home. - Started on oxygen 3-4 liters continuous.  Desats to 87% when oxygen is removed.   History of gout  - chronic maintenance prednisone of 5 mg a day - continue with allopurinol and colcichine   History of dementia  - Resume Namenda  Procedures:   Upper Egd 10/11 ENDOSCOPIC IMPRESSION:  1. multiple gastric nonbleeding polyps  2. 2 slightly friable polyps-cauterized with argon plasma coagulator. 3.  source for GI blood loss not certain    Colonoscopy 10/12 ENDOSCOPIC IMPRESSION:  1. large cecal AVM-status post cauterization  2. colonic polyposis  3. diverticulosis  4. internal hemorrhoids  Melena very likely was due to bleeding from the cecal AVM   Consultations:  [facility].  Discharge Exam: Filed Vitals:   03/06/13 1151  BP: 124/55  Pulse: 61  Temp: 98.4 F (36.9 C)  Resp: 18    General: Awake, Alert, cracking jokes.  Mildly orthostatic when standing. Cardiovascular: displaced left, rrr, no m/r/g.  Pulse remains at approx 60 when lying or standing. Respiratory: CTA no w/c/r, on 4L nasal canula.  Desats to 87% when oxygen is removed. Abdomen:  Protuberant, nt, +BS, unable to appreciate any masses. Extremities:  No clubbing, cyanosis, or edema.  Discharge Instructions      Discharge Orders   Future Appointments Provider Department Dept Phone   03/07/2013 9:00 AM Dava Najjar [facility] MEDICAL ONCOLOGY [phone]   03/07/2013 9:30 AM Artis Delay, MD [facility] MEDICAL ONCOLOGY [phone]   05/02/2013 9:00 AM [facility]-Site 3 Echo Pv 4  [facility] SITE 3 ECHO LAB [phone]   05/02/2013 10:30 AM Tonny Bollman, MD [facility] [place] Office [phone]   07/14/2013 9:30 AM Nilda Riggs, NP [facility] [phone]   Future Orders Complete By Expires   Diet - low sodium heart healthy  As directed    Increase activity slowly  As directed        Medication List         acetaminophen 650 MG CR tablet  Commonly known as:  TYLENOL  Take 1,300 mg by mouth 2 (two) times daily.     allopurinol 300 MG tablet  Commonly known as:  ZYLOPRIM  Take 300 mg by mouth 2 (two) times daily.     amLODipine 10 MG tablet  Commonly known as:  NORVASC  Take 10 mg by mouth daily.     aspirin EC 325 MG tablet  Take 1 tablet (325 mg total) by mouth daily.  Start taking on:  03/19/2013     atorvastatin 20 MG tablet  Commonly known as:  LIPITOR  Take 20 mg by mouth daily.     cloNIDine HCl 0.1 MG Tb12 ER tablet  Commonly known as:  KAPVAY  Take 1 tablet (0.1 mg total) by mouth 2 (two) times daily.     colchicine 0.6 MG tablet  Take 0.6 mg by mouth 2 (two) times daily.     escitalopram 20 MG tablet  Commonly known as:  LEXAPRO  Take 20 mg by mouth daily.     ezetimibe 10 MG tablet  Commonly known as:  ZETIA  Take 10 mg by mouth daily.     folic acid 400 MCG tablet  Commonly known as:  FOLVITE  Take 400 mcg by mouth daily.     furosemide 80 MG tablet  Commonly known as:  LASIX  Take 1 tablet (80 mg total) by mouth 2 (two) times daily.     gabapentin 100 MG tablet  Commonly known as:  NEURONTIN  Take 100 mg by mouth at bedtime.     insulin aspart 100 UNIT/ML injection  Commonly known as:  novoLOG  Inject 20 Units into the skin 3 (three) times daily with meals.     insulin glargine 100 UNIT/ML injection  Commonly known as:  LANTUS  Inject 59 Units into the skin 2 (two) times daily.     isosorbide mononitrate 30 MG 24 hr tablet  Commonly known as:  IMDUR  Take 60 mg  by mouth daily.     memantine 10 MG tablet  Commonly known as:  NAMENDA  Take 1 tablet (10 mg total) by mouth 2 (two) times daily.     metolazone 2.5 MG tablet  Commonly known as:  ZAROXOLYN  Take 2.5 mg by mouth See admin instructions. Take 30 minutes prior to morning furosemide dose for a weight gain of 3 lbs or more     multivitamin with minerals Tabs tablet  Take 0.5 tablets by mouth 2 (two) times daily.     nitroGLYCERIN 0.4 MG SL tablet  Commonly known as:  NITROSTAT  Place 0.4 mg under the tongue every 5 (five) minutes as needed for chest pain. May repeat up to 3 times     pantoprazole 40 MG tablet  Commonly known as:  PROTONIX  Take 40 mg by mouth daily.     predniSONE 5 MG tablet  Commonly known as:  DELTASONE  Take 5 mg by mouth daily.     PROAIR HFA 108 (90 BASE) MCG/ACT inhaler  Generic drug:  albuterol  Inhale 2 puffs into the lungs daily as needed for wheezing or shortness of breath.     albuterol (2.5 MG/3ML) 0.083% nebulizer solution  Commonly known as:  PROVENTIL  Take 2.5 mg by nebulization 4 (four) times daily.     tiotropium 18 MCG inhalation capsule  Commonly known as:  SPIRIVA  Place 18 mcg into inhaler and inhale at bedtime.       Allergies  Allergen Reactions  . Donepezil Diarrhea  . Heparin Rash and Other (See Comments)    Family stated it almost killed him. Hallucinations and itching.  Marland Kitchen Hydralazine Other (See Comments)    DO NOT TAKE PER MD  . Hydromorphone Itching  . Morphine Itching  . Plavix [Clopidogrel Bisulfate] Other (See Comments)    GI BLEEDING; "so bad I had to take blood; dr took me off it"  . Promethazine Other (See  Comments)    respiratory failure  . Sulfonamide Derivatives Itching  . Cefuroxime Other (See Comments)     unkown reaction  . Metoprolol Other (See Comments)    unknown  . Ertapenem Itching and Rash    12/31/2011 pt does not recall allergy or severity   Follow-up Information   Follow up with Yancey Flemings, MD.  Schedule an appointment as soon as possible for a visit in 2 weeks.   Specialty:  Gastroenterology   Contact information:   [address] [place] [address] [phone]       Follow up with Kaleen Mask, MD. Schedule an appointment as soon as possible for a visit in 1 week.   Specialty:  Family Medicine   Contact information:   [address] [place] [address] [phone]       Follow up with Waymon Budge, MD. Schedule an appointment as soon as possible for a visit in 3 weeks.   Specialty:  Pulmonary Disease   Contact information:   [address]   [facility] [place] [address] [phone]        The results of significant diagnostics from this hospitalization (including imaging, microbiology, ancillary and laboratory) are listed below for reference.    Significant Diagnostic Studies: Dg Chest 2 View  02/27/2013   CLINICAL DATA:  Chest pain and shortness of breath. Dizziness for 10 days.  EXAM: CHEST  2 VIEW  COMPARISON:  09/20/2012  FINDINGS: Mild hyperinflation. Prior median sternotomy. Midline trachea. Mild cardiomegaly. Mild bilateral apparent pleural thickening, favored to be related to extrapleural fat prominence. Present on 07/11/2012. Clear lungs. No congestive failure.  IMPRESSION: Cardiomegaly and hyperinflation, without congestive failure or acute superimposed process.   Electronically Signed   By: Jeronimo Greaves M.D.   On: 02/27/2013 15:02   Dg Chest Port 1 View  03/03/2013   CLINICAL DATA:  Chest pain.  EXAM: PORTABLE CHEST - 1 VIEW  COMPARISON:  Single view of the chest 09/20/2012 and PA and lateral chest 02/27/2013.  FINDINGS: There is cardiomegaly and vascular congestion. No consolidative process, pneumothorax or effusion is identified.  IMPRESSION: Cardiomegaly and vascular congestion without acute disease.   Electronically Signed   By: Drusilla Kanner M.D.   On: 03/03/2013 18:09    Microbiology: Recent Results  (from the past 240 hour(s))  MRSA PCR SCREENING     Status: Abnormal   Collection Time    03/03/13  9:35 PM      Result Value Range Status   MRSA by PCR POSITIVE (*) NEGATIVE Final   Comment:            The GeneXpert MRSA Assay (FDA     approved for NASAL specimens     only), is one component of a     comprehensive MRSA colonization     surveillance program. It is not     intended to diagnose MRSA     infection nor to guide or     monitor treatment for     MRSA infections.     RESULT CALLED TO, READ BACK BY AND VERIFIED WITH:     M.PIEL RN AT 0117 03/04/13 BY L.PITT     Labs: Basic Metabolic Panel:  Recent Labs Lab 03/03/13 1742 03/03/13 1808 03/04/13 0520 03/05/13 0340 03/06/13 0513  NA 138 139 141 141 139  K 4.6 5.8* 4.5 3.9 3.5  CL 99 102 104 105 99  CO2 26  --  26 26  28  GLUCOSE 279* 278* 172* 115* 122*  BUN 56* 82* 49* 42* 41*  CREATININE 1.98* 2.30* 1.82* 1.80* 2.10*  CALCIUM 9.5  --  9.4 8.9 9.2  MG  --   --  2.1  --   --   PHOS  --   --  3.2  --   --    Liver Function Tests:  Recent Labs Lab 03/04/13 0520  AST 25  ALT 17  ALKPHOS 74  BILITOT 0.6  PROT 6.8  ALBUMIN 3.4*   CBC:  Recent Labs Lab 03/03/13 1808 03/04/13 0520 03/05/13 0340 03/06/13 0513  WBC  --  10.5 8.7 9.9  HGB 7.8* 9.1* 8.5* 8.7*  HCT 23.0* 29.2* 27.2* 28.8*  MCV  --  85.9 86.3 87.0  PLT  --  229 215 229   Cardiac Enzymes:  Recent Labs Lab 03/04/13 0530 03/04/13 1200  TROPONINI <0.30 <0.30   BNP: BNP (last 3 results)  Recent Labs  05/04/12 1308 09/20/12 1055 03/03/13 1742  PROBNP 404.0* 497.0* 820.2*   CBG:  Recent Labs Lab 03/05/13 0508 03/05/13 0904 03/05/13 1630 03/05/13 2129 03/06/13 0729  GLUCAP 103* 98 196* 184* 120*    Signed:  Conley Canal [phone]  [facility] 03/06/2013, 11:56 AM  Attending Patient seen and examined, agree with the above assessment and plan. Hb stable, suspect functionally is close to usual  baseline, suspect he may require O2 during the day as well, already on nocturnal O2. Stable for discharge.Spoke with spouse at bedside and updated her.  Windell Norfolk MD

## 2013-03-06 NOTE — Progress Notes (Signed)
Discharge instructions reviewed with patient and wife. Both verbalized understanding. Patient discharged via wheelchair

## 2013-03-06 NOTE — Progress Notes (Signed)
     [facility] Daily Rounding Note 03/06/2013, 9:12 AM  SUBJECTIVE:       No complaints.  Eating well  No belly pain .  No cough or dyspnea.   OBJECTIVE:         Vital signs in last 24 hours:    Temp:  [98.2 F (36.8 C)-100.5 F (38.1 C)] 99.2 F (37.3 C) (10/13 0730) Pulse Rate:  [53-75] 53 (10/13 0730) Resp:  [8-77] 21 (10/13 0730) BP: (106-164)/(37-93) 106/49 mmHg (10/13 0854) SpO2:  [88 %-98 %] 95 % (10/13 0730) Weight:  [109.5 kg (241 lb 6.5 oz)] 109.5 kg (241 lb 6.5 oz) (10/13 0338) Last BM Date: 03/05/13 General: pleasant. NAD .  Looks chronically ill  Heart: RRR Chest: clear bil.  No dyspnea Abdomen: soft, NT, protuberant. Trace hematoma. ,   Extremities: trace edema Neuro/Psych:  Pleasant.  NAD  Intake/Output from previous day: 10/12 0701 - 10/13 0700 In: 225 [I.V.:225] Out: 1875 [Urine:1875]  Intake/Output this shift: Total I/O In: -  Out: 450 [Urine:450]  Lab Results:  Recent Labs  03/04/13 0520 03/05/13 0340 03/06/13 0513  WBC 10.5 8.7 9.9  HGB 9.1* 8.5* 8.7*  HCT 29.2* 27.2* 28.8*  PLT 229 215 229   BMET  Recent Labs  03/04/13 0520 03/05/13 0340 03/06/13 0513  NA 141 141 139  K 4.5 3.9 3.5  CL 104 105 99  CO2 26 26 28   GLUCOSE 172* 115* 122*  BUN 49* 42* 41*  CREATININE 1.82* 1.80* 2.10*  CALCIUM 9.4 8.9 9.2   LFT  Recent Labs  03/04/13 0520  PROT 6.8  ALBUMIN 3.4*  AST 25  ALT 17  ALKPHOS 74  BILITOT 0.6    ASSESMENT: *  Recurrent anemia.  S/p 2 units PRBCs.  Due to blood loss.  Suspect anemia of chronic disease playing a role as well.  *  Black stools. FOB +. S/p Colonoscopy with cauterization of cecal AVM 03/05/13, removal of small polyps. Hx adenomatous polyps (in 2000, 2007) S/P EGD 03/04/13:  Multiple gastric polyps, APC applied to 2 slightly friable polyps.  *  CAD. Previous CABG.  S/P multiple cardiac stents. Plavix discontinued in past due to GI bleed.  *  Diastolic CHF *  Type 2 DM.  Insulin requiring.   Dietary non-compliance.  *  OSA *  Morbid obesity.  *  CKD.  Dialysis required in past during acute illnesses secondary to contrast induced nephtopathy, last was ~ 2012. *  PVD:  S/p peripheral stenting: Bil Iliacs ( restent 11/2011 for instent restenosis.), Left renal artery. .   *  Chronic Prednisone 5mg  daily. ? For resp issues vs gout vs???  PLAN: *  Chronic once daily Trinsicon will provide needed Iron as well as folic acid (so will d/c the folic acid) and B 12.  Change to oral PPI (Protonix PTA),  *  Change to carb mod diet.  *  GI follow up PRN with Dr Marina Goodell.  Follow up colonoscopy in 3 to 5 years depending on overall health and Polyp pathology.    LOS: 3 days   [name]  03/06/2013, 9:12 AM Pager: [phone] Attending MD note:   I have reviewed the above note, examined the patient and agree with plan of treatment.  [facility] Pager # [phone]

## 2013-03-06 NOTE — Evaluation (Signed)
Physical Therapy Evaluation Patient Details Name: [name] MRN: [id number] DOB: [birth year] Today's Date: 03/06/2013 Time: 8295-6213 PT Time Calculation (min): 23 min  PT Assessment / Plan / Recommendation History of Present Illness  Patient is a 79 yo male admitted with GIB and anemia.  Clinical Impression  Patient did well with mobility and gait today. At independent/mod I level for mobility and gait.  Balance good during gait.  Patient ready for discharge from PT perspective.  No f/u PT recommended.    PT Assessment  Patent does not need any further PT services    Follow Up Recommendations  No PT follow up;Supervision - Intermittent    Does the patient have the potential to tolerate intense rehabilitation      Barriers to Discharge        Equipment Recommendations  None recommended by PT    Recommendations for Other Services     Frequency      Precautions / Restrictions Precautions Precautions: Other (comment) Precaution Comments: On O2 at 3 l/min Restrictions Weight Bearing Restrictions: No   Pertinent Vitals/Pain       Mobility  Bed Mobility Bed Mobility: Supine to Sit;Sitting - Scoot to Edge of Bed Supine to Sit: 4: Min assist;HOB elevated Sitting - Scoot to Edge of Bed: 5: Supervision Details for Bed Mobility Assistance: Verbal cues for technique.  Assist to raise trunk from bed to sitting.  Patient with good sitting balance at EOB. Transfers Transfers: Sit to Stand;Stand to Sit Sit to Stand: 4: Min guard;With upper extremity assist;From bed Stand to Sit: 4: Min guard;To bed Details for Transfer Assistance: Assist for balance/safety only.   Ambulation/Gait Ambulation/Gait Assistance: 4: Min guard Ambulation Distance (Feet): 180 Feet Assistive device: None Ambulation/Gait Assistance Details: Patient with good gait pattern and balance.  Slow, steady gait. Gait Pattern: Step-through pattern;Decreased stride length General Gait Details: Patient  ambulated with O2 at 3 l/min.  O2 sats dropped once to 89%.  Majority of time above 90%.        PT Goals(Current goals can be found in the care plan section)    Visit Information  Last PT Received On: 03/06/13 Assistance Needed: +1 History of Present Illness: Patient is a 79 yo male admitted with GIB and anemia.       Prior Functioning  Home Living Family/patient expects to be discharged to:: Private residence Living Arrangements: Spouse/significant other Available Help at Discharge: Family;Available PRN/intermittently (Wife works.  Son and daughter live nearby) Type of Home: House Home Access: Level entry Home Layout: One level Home Equipment: Bedside commode;Shower seat;Walker - 4 wheels;Cane - single point Prior Function Level of Independence: Independent;Needs assistance ADL's / Homemaking Assistance Needed: Assist with bathing - takes sponge baths. Communication Communication: No difficulties    Cognition  Cognition Arousal/Alertness: Awake/alert Behavior During Therapy: WFL for tasks assessed/performed Overall Cognitive Status: Within Functional Limits for tasks assessed    Extremity/Trunk Assessment Upper Extremity Assessment Upper Extremity Assessment: Overall WFL for tasks assessed Lower Extremity Assessment Lower Extremity Assessment: Overall WFL for tasks assessed   Balance Balance Balance Assessed: Yes High Level Balance High Level Balance Activites: Turns;Sudden stops;Head turns High Level Balance Comments: No loss of balance during high level balance activities.  End of Session PT - End of Session Equipment Utilized During Treatment: Gait belt;Oxygen Activity Tolerance: Patient tolerated treatment well Patient left: in bed;with call bell/phone within reach;with family/visitor present (sitting EOB) Nurse Communication: Mobility status  GP     Vena Austria 03/06/2013,  1:25 PM Durenda Hurt. Renaldo Fiddler, [facility] Acute Rehab Services Pager [phone]

## 2013-03-07 ENCOUNTER — Encounter: Payer: Self-pay | Admitting: Gastroenterology

## 2013-03-07 ENCOUNTER — Other Ambulatory Visit (HOSPITAL_BASED_OUTPATIENT_CLINIC_OR_DEPARTMENT_OTHER): Payer: BC Managed Care – PPO | Admitting: Lab

## 2013-03-07 ENCOUNTER — Encounter: Payer: Self-pay | Admitting: Hematology and Oncology

## 2013-03-07 ENCOUNTER — Telehealth: Payer: Self-pay | Admitting: Hematology and Oncology

## 2013-03-07 ENCOUNTER — Encounter: Payer: Self-pay | Admitting: Internal Medicine

## 2013-03-07 ENCOUNTER — Ambulatory Visit (HOSPITAL_BASED_OUTPATIENT_CLINIC_OR_DEPARTMENT_OTHER): Payer: BC Managed Care – PPO | Admitting: Hematology and Oncology

## 2013-03-07 VITALS — BP 125/47 | HR 61 | Temp 97.8°F | Resp 20 | Ht 68.0 in | Wt 240.5 lb

## 2013-03-07 DIAGNOSIS — N189 Chronic kidney disease, unspecified: Secondary | ICD-10-CM

## 2013-03-07 DIAGNOSIS — K922 Gastrointestinal hemorrhage, unspecified: Secondary | ICD-10-CM

## 2013-03-07 DIAGNOSIS — I509 Heart failure, unspecified: Secondary | ICD-10-CM

## 2013-03-07 DIAGNOSIS — D500 Iron deficiency anemia secondary to blood loss (chronic): Secondary | ICD-10-CM

## 2013-03-07 DIAGNOSIS — D649 Anemia, unspecified: Secondary | ICD-10-CM

## 2013-03-07 HISTORY — DX: Chronic kidney disease, unspecified: N18.9

## 2013-03-07 LAB — CBC & DIFF AND RETIC
BASO%: 0.2 % (ref 0.0–2.0)
Eosinophils Absolute: 0.6 10*3/uL — ABNORMAL HIGH (ref 0.0–0.5)
HCT: 28.9 % — ABNORMAL LOW (ref 38.4–49.9)
Immature Retic Fract: 26.2 % — ABNORMAL HIGH (ref 3.00–10.60)
MCHC: 30.4 g/dL — ABNORMAL LOW (ref 32.0–36.0)
MONO#: 0.9 10*3/uL (ref 0.1–0.9)
NEUT#: 9.2 10*3/uL — ABNORMAL HIGH (ref 1.5–6.5)
RBC: 3.39 10*6/uL — ABNORMAL LOW (ref 4.20–5.82)
RDW: 18.1 % — ABNORMAL HIGH (ref 11.0–14.6)
Retic %: 4.15 % — ABNORMAL HIGH (ref 0.80–1.80)
Retic Ct Abs: 140.69 10*3/uL — ABNORMAL HIGH (ref 34.80–93.90)
WBC: 12.2 10*3/uL — ABNORMAL HIGH (ref 4.0–10.3)
lymph#: 1.5 10*3/uL (ref 0.9–3.3)
nRBC: 0 % (ref 0–0)

## 2013-03-07 LAB — TYPE AND SCREEN
ABO/RH(D): O POS
Unit division: 0
Unit division: 0

## 2013-03-07 LAB — HOLD TUBE, BLOOD BANK

## 2013-03-07 NOTE — Telephone Encounter (Signed)
gv pt appt schedule for October and November.  °

## 2013-03-07 NOTE — Progress Notes (Signed)
[facility] OFFICE PROGRESS NOTE  Kaleen Mask, MD DIAGNOSIS:   Chronic GI bleed secondary to bleeding arteriovenous malformation, on chronic antiplatelet agents for peripheral vascular disease  SUMMARY OF HEMATOLOGIC HISTORY: I reviewed the patient's records extensively and collaborated the history with the patient's border. His last normal CBC was around 2008 with a hemoglobin of 14. Something around 2012, he had worsening anemia secondary to bleeding AVM. Plavix was discontinued. Between 2012 to 2014, Chronic GI bleed and most recently his antiplatelet agents was put on hold due to frequent bleeding. The patient requires some blood transfusions intermittently this year. INTERVAL HISTORY: [name] 79 y.o. male returns for return visit regarding his anemia. He denies any epistaxis or hematuria. He does have intermittent melena from time to time. Since his aspirin was put on hold, he has no further passage of dark stool. He has occasional dizziness but no chest pain. His shortness of breath on minimal exertion. The patient is chronically fatigued and takes naps during the daytime. He is only able to perform self-care activities only. Sometimes even taking a shower required assistance due to profound weakness.He denies any recent fever, chills, night sweats or abnormal weight loss  I have reviewed the past medical history, past surgical history, social history and family history with the patient and they are unchanged from previous note.  ALLERGIES:  is allergic to donepezil; heparin; hydralazine; hydromorphone; morphine; plavix; promethazine; sulfonamide derivatives; cefuroxime; metoprolol; and ertapenem.  MEDICATIONS:  Current Outpatient Prescriptions  Medication Sig Dispense Refill  . acetaminophen (TYLENOL) 650 MG CR tablet Take 1,300 mg by mouth 2 (two) times daily.      Marland Kitchen albuterol (PROAIR HFA) 108 (90 BASE) MCG/ACT inhaler Inhale 2 puffs into the lungs daily  as needed for wheezing or shortness of breath.      Marland Kitchen albuterol (PROVENTIL) (2.5 MG/3ML) 0.083% nebulizer solution Take 2.5 mg by nebulization 4 (four) times daily.      Marland Kitchen allopurinol (ZYLOPRIM) 300 MG tablet Take 300 mg by mouth 2 (two) times daily.      Marland Kitchen amLODipine (NORVASC) 10 MG tablet Take 10 mg by mouth daily.      Marland Kitchen atorvastatin (LIPITOR) 20 MG tablet Take 20 mg by mouth daily.       . cloNIDine HCl (KAPVAY) 0.1 MG TB12 ER tablet Take 1 tablet (0.1 mg total) by mouth 2 (two) times daily.  60 tablet  5  . colchicine 0.6 MG tablet Take 0.6 mg by mouth 2 (two) times daily.      Marland Kitchen escitalopram (LEXAPRO) 20 MG tablet Take 20 mg by mouth daily.      Marland Kitchen ezetimibe (ZETIA) 10 MG tablet Take 10 mg by mouth daily.       . folic acid (FOLVITE) 400 MCG tablet Take 400 mcg by mouth daily.       . furosemide (LASIX) 80 MG tablet Take 1 tablet (80 mg total) by mouth 2 (two) times daily.  180 tablet  3  . gabapentin (NEURONTIN) 100 MG tablet Take 100 mg by mouth at bedtime.       . insulin aspart (NOVOLOG) 100 UNIT/ML injection Inject 20 Units into the skin 3 (three) times daily with meals.      . insulin glargine (LANTUS) 100 UNIT/ML injection Inject 59 Units into the skin 2 (two) times daily.       . isosorbide mononitrate (IMDUR) 30 MG 24 hr tablet Take 60 mg by mouth daily.       Marland Kitchen  metolazone (ZAROXOLYN) 2.5 MG tablet Take 2.5 mg by mouth See admin instructions. Take 30 minutes prior to morning furosemide dose for a weight gain of 3 lbs or more      . Multiple Vitamin (MULTIVITAMIN WITH MINERALS) TABS Take 0.5 tablets by mouth 2 (two) times daily.      . nitroGLYCERIN (NITROSTAT) 0.4 MG SL tablet Place 0.4 mg under the tongue every 5 (five) minutes as needed for chest pain. May repeat up to 3 times      . pantoprazole (PROTONIX) 40 MG tablet Take 40 mg by mouth daily.       . predniSONE (DELTASONE) 5 MG tablet Take 5 mg by mouth daily.      Marland Kitchen tiotropium (SPIRIVA) 18 MCG inhalation capsule Place 18 mcg  into inhaler and inhale at bedtime.      Melene Muller ON 03/19/2013] aspirin EC 325 MG tablet Take 1 tablet (325 mg total) by mouth daily.  30 tablet  0  . memantine (NAMENDA) 10 MG tablet Take 1 tablet (10 mg total) by mouth 2 (two) times daily.  60 tablet  11   No current facility-administered medications for this visit.     REVIEW OF SYSTEMS:   Constitutional: Denies fevers, chills or night sweats Eyes: Denies blurriness of vision Ears, nose, mouth, throat, and face: Denies mucositis or sore throat Respiratory: Denies cough, dyspnea or wheezes Cardiovascular: Denies palpitation, chest discomfort he has chronic bilateral  lower extremity swelling Gastrointestinal:  Denies nausea, heartburn or change in bowel habits Skin: Denies abnormal skin rashes Lymphatics: Denies new lymphadenopathy or easy bruising Neurological:Denies numbness, tingling or new weaknesses Behavioral/Psych: Mood is stable, no new changes  All other systems were reviewed with the patient and are negative.  PHYSICAL EXAMINATION: ECOG PERFORMANCE STATUS: 2 - Symptomatic, <50% confined to bed  Filed Vitals:   03/07/13 0940  BP: 125/47  Pulse: 61  Temp: 97.8 F (36.6 C)  Resp: 20   Filed Weights   03/07/13 0940  Weight: 240 lb 8 oz (109.09 kg)    GENERAL:alert, no distress and comfortable he is morbidly obese and elderly  SKIN: skin color, texture, turgor are normal, no rashes or significant lesions EYES: normal, Conjunctiva are  pale  and non-injected, sclera clear OROPHARYNX:no exudate, no erythema and lips, buccal mucosa, and tongue normal  NECK: supple, thyroid normal size, non-tender, without nodularity LYMPH:  no palpable lymphadenopathy in the cervical, axillary or inguinal LUNGS: clear to auscultation and percussion with normal breathing effort HEART: regular rate & rhythm and no murmurs and no lower extremity edema ABDOMEN:abdomen soft, non-tender and normal bowel sounds Musculoskeletal:no cyanosis  of digits and no clubbing  NEURO: alert & oriented x 3 with fluent speech, no focal motor/sensory deficits  LABORATORY DATA:  I have reviewed the data as listed Results for orders placed in visit on 03/07/13 (from the past 48 hour(s))  CBC & DIFF AND RETIC     Status: Abnormal   Collection Time    03/07/13  9:10 AM      Result Value Range   WBC 12.2 (*) 4.0 - 10.3 10e3/uL   NEUT# 9.2 (*) 1.5 - 6.5 10e3/uL   HGB 8.8 (*) 13.0 - 17.1 g/dL   HCT 16.1 (*) 09.6 - 04.5 %   Platelets 230  140 - 400 10e3/uL   MCV 85.3  79.3 - 98.0 fL   MCH 26.0 (*) 27.2 - 33.4 pg   MCHC 30.4 (*) 32.0 - 36.0 g/dL  RBC 3.39 (*) 4.20 - 5.82 10e6/uL   RDW 18.1 (*) 11.0 - 14.6 %   lymph# 1.5  0.9 - 3.3 10e3/uL   MONO# 0.9  0.1 - 0.9 10e3/uL   Eosinophils Absolute 0.6 (*) 0.0 - 0.5 10e3/uL   Basophils Absolute 0.0  0.0 - 0.1 10e3/uL   NEUT% 75.4 (*) 39.0 - 75.0 %   LYMPH% 12.2 (*) 14.0 - 49.0 %   MONO% 7.1  0.0 - 14.0 %   EOS% 5.1  0.0 - 7.0 %   BASO% 0.2  0.0 - 2.0 %   nRBC 0  0 - 0 %   Retic % 4.15 (*) 0.80 - 1.80 %   Retic Ct Abs 140.69 (*) 34.80 - 93.90 10e3/uL   Immature Retic Fract 26.20 (*) 3.00 - 10.60 %     ASSESSMENT:  Chronic anemia, multifactorial, secondary to chronic GI bleed and anemia chronic disease   PLAN:  #1 anemia  This is likely anemia of chronic disease and chronic GI bleed. The patient has intermittent melena secondary to the AVMs, which has stopped recently due to withholding antiplatelet agents. He is asymptomatic from the anemia. We will observe for now.  he does not require transfusion now.   I recommend a trial of intravenous iron in an attempt to return his ferritin level over 150. Once we achieve that, if you still have anemia less than 10 g, we could put on a trial of erythropoietin stimulating agents to help control the anemia. I agree with holding off antiplatelet agents until his anemia stabilized.  I discussed with him risks, benefits, side effects of intravenous iron  infusion. Previously he tolerated that well without any side effects. He agrees to hold off erythropoietin stimulating agents for now. I will go ahead and start him on intravenous iron infusion this week if possible My goal would be to attend to bring his hemoglobin closer to 10 and avoid excessive transfusions in the future #2 chronic kidney disease This is secondary to his diabetes and peripheral vascular disease. As mentioned above, we might need to return protein stimulating agents in the future #3 congestive heart failure He is on a lot of medications for this. As mentioned above, would hold off antiplatelet agents until his condition stabilized.  I discussed this plan of care in the presence of his daughter who is his primary medical power of attorney. See the patient back in 3 weeks.  All questions were answered. The patient knows to call the clinic with any problems, questions or concerns. No barriers to learning was detected.  I spent 40 minutes counseling the patient face to face. The total time spent in the appointment was 60 minutes and more than 50% was on counseling.     Eugina Row, MD 03/07/2013 10:04 AM

## 2013-03-09 ENCOUNTER — Ambulatory Visit (HOSPITAL_BASED_OUTPATIENT_CLINIC_OR_DEPARTMENT_OTHER): Payer: BC Managed Care – PPO

## 2013-03-09 VITALS — BP 115/61 | HR 75 | Temp 97.9°F | Resp 20

## 2013-03-09 DIAGNOSIS — K31811 Angiodysplasia of stomach and duodenum with bleeding: Secondary | ICD-10-CM

## 2013-03-09 DIAGNOSIS — D500 Iron deficiency anemia secondary to blood loss (chronic): Secondary | ICD-10-CM

## 2013-03-09 DIAGNOSIS — K922 Gastrointestinal hemorrhage, unspecified: Secondary | ICD-10-CM

## 2013-03-09 MED ORDER — SODIUM CHLORIDE 0.9 % IV SOLN
1020.0000 mg | Freq: Once | INTRAVENOUS | Status: AC
  Administered 2013-03-09: 1020 mg via INTRAVENOUS
  Filled 2013-03-09: qty 34

## 2013-03-09 MED ORDER — SODIUM CHLORIDE 0.9 % IV SOLN
Freq: Once | INTRAVENOUS | Status: AC
  Administered 2013-03-09: 08:00:00 via INTRAVENOUS

## 2013-03-09 NOTE — Patient Instructions (Signed)
Ferumoxytol injection What is this medicine? FERUMOXYTOL is an iron complex. Iron is used to make healthy red blood cells, which carry oxygen and nutrients throughout the body. This medicine is used to treat iron deficiency anemia in people with chronic kidney disease. This medicine may be used for other purposes; ask your health care provider or pharmacist if you have questions. What should I tell my health care provider before I take this medicine? They need to know if you have any of these conditions: -anemia not caused by low iron levels -high levels of iron in the blood -magnetic resonance imaging (MRI) test scheduled -an unusual or allergic reaction to iron, other medicines, foods, dyes, or preservatives -pregnant or trying to get pregnant -breast-feeding How should I use this medicine? This medicine is for infusion into a vein. It is given by a health care professional in a hospital or clinic setting. Talk to your pediatrician regarding the use of this medicine in children. Special care may be needed. Overdosage: If you think you've taken too much of this medicine contact a poison control center or emergency room at once. Overdosage: If you think you have taken too much of this medicine contact a poison control center or emergency room at once. NOTE: This medicine is only for you. Do not share this medicine with others. What if I miss a dose? It is important not to miss your dose. Call your doctor or health care professional if you are unable to keep an appointment. What may interact with this medicine? This medicine may interact with the following medications: -other iron products This list may not describe all possible interactions. Give your health care provider a list of all the medicines, herbs, non-prescription drugs, or dietary supplements you use. Also tell them if you smoke, drink alcohol, or use illegal drugs. Some items may interact with your medicine. What should I watch  for while using this medicine? Visit your doctor or healthcare professional regularly. Tell your doctor or healthcare professional if your symptoms do not start to get better or if they get worse. You may need blood work done while you are taking this medicine. You may need to follow a special diet. Talk to your doctor. Foods that contain iron include: whole grains/cereals, dried fruits, beans, or peas, leafy green vegetables, and organ meats (liver, kidney). What side effects may I notice from receiving this medicine? Side effects that you should report to your doctor or health care professional as soon as possible: -allergic reactions like skin rash, itching or hives, swelling of the face, lips, or tongue -breathing problems -changes in blood pressure -feeling faint or lightheaded, falls -fever or chills -flushing, sweating, or hot feelings -swelling of the ankles or feet Side effects that usually do not require medical attention (Report these to your doctor or health care professional if they continue or are bothersome.): -diarrhea -headache -nausea, vomiting -stomach pain This list may not describe all possible side effects. Call your doctor for medical advice about side effects. You may report side effects to FDA at 1-800-FDA-1088. Where should I keep my medicine? This drug is given in a hospital or clinic and will not be stored at home. NOTE: This sheet is a summary. It may not cover all possible information. If you have questions about this medicine, talk to your doctor, pharmacist, or health care provider.  2013, [employer]. (02/01/2008 9:48:25 PM)  

## 2013-03-09 NOTE — Progress Notes (Signed)
Patient completed Feraheme without difficulty or complaints. Pt monitored x30 minutes post infusion. VSS, AAO, PIV removed and patient ambulated to exit without complaints.

## 2013-03-28 ENCOUNTER — Ambulatory Visit (HOSPITAL_BASED_OUTPATIENT_CLINIC_OR_DEPARTMENT_OTHER): Payer: BC Managed Care – PPO | Admitting: Hematology and Oncology

## 2013-03-28 ENCOUNTER — Other Ambulatory Visit (HOSPITAL_BASED_OUTPATIENT_CLINIC_OR_DEPARTMENT_OTHER): Payer: BC Managed Care – PPO | Admitting: Lab

## 2013-03-28 ENCOUNTER — Telehealth: Payer: Self-pay | Admitting: Hematology and Oncology

## 2013-03-28 ENCOUNTER — Encounter: Payer: Self-pay | Admitting: Hematology and Oncology

## 2013-03-28 VITALS — BP 123/52 | HR 60 | Temp 97.1°F | Resp 18 | Ht 68.0 in | Wt 245.3 lb

## 2013-03-28 DIAGNOSIS — D649 Anemia, unspecified: Secondary | ICD-10-CM

## 2013-03-28 DIAGNOSIS — K922 Gastrointestinal hemorrhage, unspecified: Secondary | ICD-10-CM

## 2013-03-28 LAB — CBC & DIFF AND RETIC
Basophils Absolute: 0 10*3/uL (ref 0.0–0.1)
EOS%: 7.2 % — ABNORMAL HIGH (ref 0.0–7.0)
Eosinophils Absolute: 0.7 10*3/uL — ABNORMAL HIGH (ref 0.0–0.5)
HGB: 10.6 g/dL — ABNORMAL LOW (ref 13.0–17.1)
MCH: 28.3 pg (ref 27.2–33.4)
MCHC: 30.7 g/dL — ABNORMAL LOW (ref 32.0–36.0)
MCV: 92.2 fL (ref 79.3–98.0)
MONO#: 0.7 10*3/uL (ref 0.1–0.9)
MONO%: 6.7 % (ref 0.0–14.0)
NEUT#: 6.9 10*3/uL — ABNORMAL HIGH (ref 1.5–6.5)
RBC: 3.74 10*6/uL — ABNORMAL LOW (ref 4.20–5.82)
RDW: 21.5 % — ABNORMAL HIGH (ref 11.0–14.6)
Retic %: 4.14 % — ABNORMAL HIGH (ref 0.80–1.80)
Retic Ct Abs: 154.84 10*3/uL — ABNORMAL HIGH (ref 34.80–93.90)
lymph#: 1.4 10*3/uL (ref 0.9–3.3)
nRBC: 0 % (ref 0–0)

## 2013-03-28 LAB — FERRITIN CHCC: Ferritin: 171 ng/ml (ref 22–316)

## 2013-03-28 LAB — HOLD TUBE, BLOOD BANK

## 2013-03-28 NOTE — Telephone Encounter (Signed)
Gave pt appt for lab and Md on January 2015 °

## 2013-03-28 NOTE — Progress Notes (Signed)
[facility] OFFICE PROGRESS NOTE  Kaleen Mask, MD DIAGNOSIS:  Iron deficiency anemia secondary to chronic GI bleed from arteriovenous malformation and anemia chronic disease  SUMMARY OF HEMATOLOGIC HISTORY: I reviewed the patient's records extensively and collaborated the history with the patient's border. His last normal CBC was around 2008 with a hemoglobin of 14. Something around 2012, he had worsening anemia secondary to bleeding AVM. Plavix was discontinued. Between 2012 to 2014, Chronic GI bleed and most recently his antiplatelet agents was put on hold due to frequent bleeding. The patient requires some blood transfusions intermittently this year. October 2014, he received 1 dose of intravenous iron infusion INTERVAL HISTORY: [name] 79 y.o. male returns for further followup. He felt great after the intravenous iron infusion which better energy level. He had 2 episodes of mild hematochezia after bowel movement. He stated that he had strained recently from mild constipation. He has resumed taking aspirin. No other forms of bleeding such as epistaxis or hematuria. No symptoms of ischemia apart from occasional dizziness.He denies any recent fever, chills, night sweats or abnormal weight loss  I have reviewed the past medical history, past surgical history, social history and family history with the patient and they are unchanged from previous note.  ALLERGIES:  is allergic to donepezil; heparin; hydralazine; hydromorphone; morphine; plavix; promethazine; sulfonamide derivatives; cefuroxime; metoprolol; and ertapenem.  MEDICATIONS:  Current Outpatient Prescriptions  Medication Sig Dispense Refill  . acetaminophen (TYLENOL) 650 MG CR tablet Take 1,300 mg by mouth 2 (two) times daily.      Marland Kitchen albuterol (PROAIR HFA) 108 (90 BASE) MCG/ACT inhaler Inhale 2 puffs into the lungs daily as needed for wheezing or shortness of breath.      Marland Kitchen albuterol (PROVENTIL) (2.5  MG/3ML) 0.083% nebulizer solution Take 2.5 mg by nebulization 4 (four) times daily.      Marland Kitchen allopurinol (ZYLOPRIM) 300 MG tablet Take 300 mg by mouth 2 (two) times daily.      Marland Kitchen amLODipine (NORVASC) 10 MG tablet Take 10 mg by mouth daily.      Marland Kitchen aspirin EC 325 MG tablet Take 1 tablet (325 mg total) by mouth daily.  30 tablet  0  . atorvastatin (LIPITOR) 20 MG tablet Take 20 mg by mouth daily.       . cloNIDine HCl (KAPVAY) 0.1 MG TB12 ER tablet Take 1 tablet (0.1 mg total) by mouth 2 (two) times daily.  60 tablet  5  . colchicine 0.6 MG tablet Take 0.6 mg by mouth 2 (two) times daily.      Marland Kitchen escitalopram (LEXAPRO) 20 MG tablet Take 20 mg by mouth daily.      Marland Kitchen ezetimibe (ZETIA) 10 MG tablet Take 10 mg by mouth daily.       . folic acid (FOLVITE) 400 MCG tablet Take 400 mcg by mouth daily.       . furosemide (LASIX) 80 MG tablet Take 1 tablet (80 mg total) by mouth 2 (two) times daily.  180 tablet  3  . gabapentin (NEURONTIN) 100 MG tablet Take 100 mg by mouth at bedtime.       . insulin aspart (NOVOLOG) 100 UNIT/ML injection Inject 20 Units into the skin 3 (three) times daily with meals.      . insulin glargine (LANTUS) 100 UNIT/ML injection Inject 59 Units into the skin 2 (two) times daily.       . isosorbide mononitrate (IMDUR) 30 MG 24 hr tablet Take 60 mg by  mouth daily.       . metolazone (ZAROXOLYN) 2.5 MG tablet Take 2.5 mg by mouth See admin instructions. Take 30 minutes prior to morning furosemide dose for a weight gain of 3 lbs or more      . Multiple Vitamin (MULTIVITAMIN WITH MINERALS) TABS Take 0.5 tablets by mouth 2 (two) times daily.      . nitroGLYCERIN (NITROSTAT) 0.4 MG SL tablet Place 0.4 mg under the tongue every 5 (five) minutes as needed for chest pain. May repeat up to 3 times      . pantoprazole (PROTONIX) 40 MG tablet Take 40 mg by mouth daily.       . predniSONE (DELTASONE) 5 MG tablet Take 5 mg by mouth daily.      Marland Kitchen tiotropium (SPIRIVA) 18 MCG inhalation capsule Place  18 mcg into inhaler and inhale at bedtime.       No current facility-administered medications for this visit.     REVIEW OF SYSTEMS:   Constitutional: Denies fevers, chills or night sweats All other systems were reviewed with the patient and are negative.  PHYSICAL EXAMINATION: ECOG PERFORMANCE STATUS: 1 - Symptomatic but completely ambulatory  Filed Vitals:   03/28/13 0859  BP: 123/52  Pulse: 60  Temp: 97.1 F (36.2 C)  Resp: 18   Filed Weights   03/28/13 0859  Weight: 245 lb 4.8 oz (111.267 kg)    GENERAL:alert, no distress and comfortable. The patient is morbidly obese  NEURO: alert & oriented x 3 with fluent speech, no focal motor/sensory deficits  LABORATORY DATA:  I have reviewed the data as listed Results for orders placed in visit on 03/28/13 (from the past 48 hour(s))  CBC & DIFF AND RETIC     Status: Abnormal   Collection Time    03/28/13  8:38 AM      Result Value Range   WBC 9.7  4.0 - 10.3 10e3/uL   NEUT# 6.9 (*) 1.5 - 6.5 10e3/uL   HGB 10.6 (*) 13.0 - 17.1 g/dL   HCT 16.1 (*) 09.6 - 04.5 %   Platelets 188  140 - 400 10e3/uL   MCV 92.2  79.3 - 98.0 fL   MCH 28.3  27.2 - 33.4 pg   MCHC 30.7 (*) 32.0 - 36.0 g/dL   RBC 4.09 (*) 8.11 - 9.14 10e6/uL   RDW 21.5 (*) 11.0 - 14.6 %   lymph# 1.4  0.9 - 3.3 10e3/uL   MONO# 0.7  0.1 - 0.9 10e3/uL   Eosinophils Absolute 0.7 (*) 0.0 - 0.5 10e3/uL   Basophils Absolute 0.0  0.0 - 0.1 10e3/uL   NEUT% 71.5  39.0 - 75.0 %   LYMPH% 14.4  14.0 - 49.0 %   MONO% 6.7  0.0 - 14.0 %   EOS% 7.2 (*) 0.0 - 7.0 %   BASO% 0.2  0.0 - 2.0 %   nRBC 0  0 - 0 %   Retic % 4.14 (*) 0.80 - 1.80 %   Retic Ct Abs 154.84 (*) 34.80 - 93.90 10e3/uL   Immature Retic Fract 16.30 (*) 3.00 - 10.60 %    ASSESSMENT:  #1 multifactorial anemia #2 history of peripheral vascular disease on chronic antiplatelet agents   PLAN:  #1 multifactorial anemia This is a combination of iron deficiency from chronic GI bleed as well as anemia chronic  disease from chronic kidney disease. The patient also has intermittent hematochezia secondary to hemorrhoids He responded well with intravenous iron therapy and  has gained 2 g of hemoglobin since then. His ferritin level is pending. I recommend we hold off erythropoietin stimulating agents as his hemoglobin has improved significantly with recent intravenous iron infusion. I will see him back in 2 months with repeat blood work and iron study. If repeat ferritin is over 150 and despite this to have persistent anemia with hemoglobin less than 10 g, we will consider erythropoietin stimulating agents #2 hemorrhoidal bleeding I recommend stool softener to avoid straining for hard stool #3 history of coronary artery disease The patient is no longer on Plavix. He has resumed taking aspirin. He will continue for now unless he has significant GI bleed in the future.  All questions were answered. The patient knows to call the clinic with any problems, questions or concerns. No barriers to learning was detected.  I spent 15 minutes counseling the patient face to face. The total time spent in the appointment was 20 minutes and more than 50% was on counseling.     Tyshay Adee, MD 03/28/2013 9:09 AM

## 2013-03-29 LAB — ERYTHROPOIETIN: Erythropoietin: 77.5 m[IU]/mL — ABNORMAL HIGH (ref 2.6–18.5)

## 2013-03-29 LAB — HAPTOGLOBIN: Haptoglobin: 144 mg/dL (ref 45–215)

## 2013-03-29 LAB — VITAMIN B12: Vitamin B-12: 534 pg/mL (ref 211–911)

## 2013-03-30 ENCOUNTER — Other Ambulatory Visit: Payer: Self-pay

## 2013-04-05 ENCOUNTER — Ambulatory Visit (INDEPENDENT_AMBULATORY_CARE_PROVIDER_SITE_OTHER): Payer: BC Managed Care – PPO | Admitting: Internal Medicine

## 2013-04-05 ENCOUNTER — Encounter: Payer: Self-pay | Admitting: Internal Medicine

## 2013-04-05 VITALS — BP 120/40 | HR 64 | Ht 68.0 in | Wt 245.8 lb

## 2013-04-05 DIAGNOSIS — K922 Gastrointestinal hemorrhage, unspecified: Secondary | ICD-10-CM

## 2013-04-05 DIAGNOSIS — D62 Acute posthemorrhagic anemia: Secondary | ICD-10-CM

## 2013-04-05 DIAGNOSIS — K5520 Angiodysplasia of colon without hemorrhage: Secondary | ICD-10-CM

## 2013-04-05 NOTE — Patient Instructions (Signed)
Please follow up with Dr. Perry as needed 

## 2013-04-05 NOTE — Progress Notes (Signed)
HISTORY OF PRESENT ILLNESS:  [name] is a 79 y.o. male with MULTIPLE SIGNIFICANT medical problems as outlined below. He has had previous evaluations for chronic anemia and intermittent Hemoccult positive stool. He has had problems with bleeding previously secondary to diverticular cause as well as AVMs. His anemia is multifactorial and he is followed at the cancer center. In addition to peripheral iron he is on multivitamin. He was admitted to the hospital October 10 with complaints of fatigue. His hemoglobin drifted from previous level of 12,008. He does report dark stools over the course of 6 weeks. Somewhat worse at the time of admission. Upper endoscopy was performed and revealed a friable gastric polyps it sounded unimpressive that were cauterized. Colonoscopy revealed a cecal AVM which was not bleeding subsequently cauterized. This was felt to be the cause for GI bleeding. Diverticulosis noted as well as diminutive colon polyps which were removed (adenomatous). He presents today for followup as requested. He is accompanied by his daughter. States his stool color has been normal since hospital discharge. His last hemoglobin November 4 was 9.7.  REVIEW OF SYSTEMS:  All non-GI ROS negative except for shortness of breath with exertion and heart rhythm change at times  Past Medical History  Diagnosis Date  . Adenomatous colon polyp   . Diverticulosis   . GERD (gastroesophageal reflux disease)   . Hiatal hernia   . Alzheimer disease   . Gout     "hands; backbone; elbows; shoulders"  . Asthma with bronchitis   . COPD (chronic obstructive pulmonary disease)   . Chronic hypoxemic respiratory failure   . OSA (obstructive sleep apnea)     CPAP  . CHF (congestive heart failure)     grade 2 diastolic dysfunction, EF 65-70% on ECHO 12/2011  . Blood transfusion     "I've had about 6 pints" (12/31/2011)  . Pneumonia     intermittent  . Anemia   . GI bleed     GI Dr. Marina Goodell,   . Hypertension    . Peripheral vascular disease     PAD  . Dysrhythmia     "skips"  . Anginal pain   . Myocardial infarction     "they said I had 2 light ones"  . Shortness of breath 12/31/2011    "all the time"  . Type II diabetes mellitus     with complications:  retinopathy, nephrophathy.   . Arthritis     "plenty"  . Chronic lower back pain   . Renal insufficiency     "went on dialysis probably 3 times" Last 04/2011  . Skin cancer     "forehead; arms"  . Depression   . Diverticulosis   . Colon polyps     adenomatous and hyperplastic  . GERD (gastroesophageal reflux disease)   . Hiatal hernia   . Memory deficits 01/11/2013  . Obesity   . Carotid bruit     bilateral  . Gout   . CKD (chronic kidney disease) 03/07/2013  . Hemorrhoids   . AVM (arteriovenous malformation) of colon   . Gastric polyp   . Atrial fibrillation     Past Surgical History  Procedure Laterality Date  . Hand surgery Left 1990s    "chain saw accident; had to reattach fingers on left hand"  . Esophagogastroduodenoscopy  05/05/2011    Procedure: ESOPHAGOGASTRODUODENOSCOPY (EGD);  Surgeon: Louis Meckel, MD;  Location: [facility];  Service: Endoscopy;  Laterality: N/A;  . Flexible sigmoidoscopy  05/05/2011  Procedure: FLEXIBLE SIGMOIDOSCOPY;  Surgeon: Louis Meckel, MD;  Location: [facility];  Service: Endoscopy;  Laterality: N/A;  . Laparoscopic cholecystectomy  ~ 2001  . Excisional hemorrhoidectomy  1950's  . Cataract extraction w/ intraocular lens  implant, bilateral Bilateral   . Coronary artery bypass graft  1997    CABG X2 vessels  . Iliac artery stent  11/2011    left/H&P  . Renal artery stent  2010    left/H&P  . Iliac artery stent  11/2011    right  . Coronary angioplasty with stent placement  12/31/2011    "I have 15 stents; heart, kidney, aorta, legs"  . Colonoscopy w/ biopsies and polypectomy      "have had probably 5 cut out" (12/31/2011)  . Enteroscopy  03/04/2012    Procedure:  ENTEROSCOPY;  Surgeon: Louis Meckel, MD;  Location: [facility] ENDOSCOPY;  Service: Endoscopy;  Laterality: N/A;  jessica/leone  . Esophagogastroduodenoscopy N/A 03/04/2013    Procedure: ESOPHAGOGASTRODUODENOSCOPY (EGD);  Surgeon: Louis Meckel, MD;  Location: [facility] ENDOSCOPY;  Service: Endoscopy;  Laterality: N/A;  . Colonoscopy N/A 03/05/2013    Procedure: COLONOSCOPY;  Surgeon: Louis Meckel, MD;  Location: [facility] ENDOSCOPY;  Service: Endoscopy;  Laterality: N/A;    Social History [name]  reports that he quit smoking about 22 years ago. His smoking use included Cigarettes. He has a 112.5 pack-year smoking history. He quit smokeless tobacco use about 22 years ago. His smokeless tobacco use included Chew. He reports that he does not drink alcohol or use illicit drugs.  family history includes ALS in his sister and another family member; Arthritis in his mother; Diabetes in his mother; Heart attack in his father and mother; Heart disease in his brother, father, and sister; Lung cancer in his brother; Stomach cancer in his maternal grandfather. There is no history of Colon cancer.  Allergies  Allergen Reactions  . Donepezil Diarrhea  . Heparin Rash and Other (See Comments)    Family stated it almost killed him. Hallucinations and itching.  Marland Kitchen Hydralazine Other (See Comments)    DO NOT TAKE PER MD  . Hydromorphone Itching  . Morphine Itching  . Plavix [Clopidogrel Bisulfate] Other (See Comments)    GI BLEEDING; "so bad I had to take blood; dr took me off it"  . Promethazine Other (See Comments)    respiratory failure  . Sulfonamide Derivatives Itching  . Cefuroxime Other (See Comments)     unkown reaction  . Metoprolol Other (See Comments)    unknown  . Ertapenem Itching and Rash    12/31/2011 pt does not recall allergy or severity       PHYSICAL EXAMINATION: Vital signs: BP 120/40  Pulse 64  Ht 5\' 8"  (1.727 m)  Wt 245 lb 12.8 oz (111.494 kg)  BMI 37.38 kg/m2 General: Obese,  Well-developed, well-nourished, no acute distress. Elderly and chronically ill-appearing HEENT: Sclerae are anicteric, conjunctiva pink. Oral mucosa intact Lungs: Clear Heart: Regular Abdomen: soft, obese, nontender, nondistended, no obvious ascites, no peritoneal signs, normal bowel sounds. No organomegaly. Extremities: 1+ edema bilaterally with chronic stasis changes Psychiatric: alert and oriented x3. Cooperative   ASSESSMENT:  #1. Multifactorial anemia. Hemoglobin stable #2. Recent GI bleed presumed secondary to AVMs. No evidence of recurrent bleeding #3. Adenomatous colon polyp. Not need for followup to age and comorbidities #3. Multiple significant medical problems   PLAN:  #1. Continue parenteral iron through the hematology department #2. Resume general medical care with her PCP  and other specialist #3. GI followup when necessary

## 2013-05-02 ENCOUNTER — Ambulatory Visit (INDEPENDENT_AMBULATORY_CARE_PROVIDER_SITE_OTHER): Payer: BC Managed Care – PPO | Admitting: Cardiovascular Disease

## 2013-05-02 ENCOUNTER — Encounter: Payer: Self-pay | Admitting: Cardiovascular Disease

## 2013-05-02 ENCOUNTER — Encounter: Payer: Self-pay | Admitting: Cardiology

## 2013-05-02 ENCOUNTER — Ambulatory Visit (HOSPITAL_COMMUNITY): Payer: BC Managed Care – PPO | Attending: Cardiology

## 2013-05-02 ENCOUNTER — Encounter (HOSPITAL_COMMUNITY): Payer: Self-pay | Admitting: Cardiovascular Disease

## 2013-05-02 VITALS — BP 140/52 | HR 57 | Ht 68.0 in | Wt 244.6 lb

## 2013-05-02 DIAGNOSIS — I70219 Atherosclerosis of native arteries of extremities with intermittent claudication, unspecified extremity: Secondary | ICD-10-CM

## 2013-05-02 DIAGNOSIS — E119 Type 2 diabetes mellitus without complications: Secondary | ICD-10-CM | POA: Insufficient documentation

## 2013-05-02 DIAGNOSIS — I70309 Unspecified atherosclerosis of unspecified type of bypass graft(s) of the extremities, unspecified extremity: Secondary | ICD-10-CM | POA: Insufficient documentation

## 2013-05-02 DIAGNOSIS — I739 Peripheral vascular disease, unspecified: Secondary | ICD-10-CM | POA: Insufficient documentation

## 2013-05-02 DIAGNOSIS — I4891 Unspecified atrial fibrillation: Secondary | ICD-10-CM

## 2013-05-02 DIAGNOSIS — I2510 Atherosclerotic heart disease of native coronary artery without angina pectoris: Secondary | ICD-10-CM

## 2013-05-02 DIAGNOSIS — I10 Essential (primary) hypertension: Secondary | ICD-10-CM | POA: Insufficient documentation

## 2013-05-02 DIAGNOSIS — R209 Unspecified disturbances of skin sensation: Secondary | ICD-10-CM | POA: Insufficient documentation

## 2013-05-02 DIAGNOSIS — E785 Hyperlipidemia, unspecified: Secondary | ICD-10-CM | POA: Insufficient documentation

## 2013-05-02 NOTE — Progress Notes (Signed)
HPI:  79 year old gentleman presenting for followup evaluation. He was last seen in August 2014.He has extensive cardiovascular disease. The patient had CABG in 1997. He's undergone multiple PCI procedures. He's had contrast-induced nephropathy in the past requiring short-term hemodialysis. He also has peripheral arterial disease and congestive heart failure. He has undergone multiple iliac stenting procedures, including treatment with covered stents. Medical therapy has been recommended for further treatment of his occlusive vascular disease. He had ABIs and a visceral duplex today. His ABIs are 0.75 on the right and 0.80 on the left. Velocities demonstrated severe bilateral iliac disease as well as common femoral arterial disease.  The patient is limited by shortness of breath and leg pain. He has chest pain not related to exertion. This is been a little more frequent recently than in the past. Walking is limited to a very short distance. He has pain throughout his legs. He also complains of numbness in the bottom of his feet related to diabetic neuropathy. He denies orthopnea, PND, or palpitations. He's been compliant with his medications. He does not follow a diet.   Outpatient Encounter Prescriptions as of 05/02/2013  Medication Sig  . acetaminophen (TYLENOL) 650 MG CR tablet Take 1,300 mg by mouth 2 (two) times daily.  Marland Kitchen albuterol (PROAIR HFA) 108 (90 BASE) MCG/ACT inhaler Inhale 2 puffs into the lungs daily as needed for wheezing or shortness of breath.  Marland Kitchen albuterol (PROVENTIL) (2.5 MG/3ML) 0.083% nebulizer solution Take 2.5 mg by nebulization 4 (four) times daily.  Marland Kitchen allopurinol (ZYLOPRIM) 300 MG tablet Take 300 mg by mouth 2 (two) times daily.  Marland Kitchen amLODipine (NORVASC) 10 MG tablet Take 10 mg by mouth daily.  Marland Kitchen aspirin EC 325 MG tablet Take 1 tablet (325 mg total) by mouth daily.  Marland Kitchen atorvastatin (LIPITOR) 20 MG tablet Take 20 mg by mouth daily.   . B-D UF III MINI PEN NEEDLES 31G X 5 MM  MISC   . cloNIDine HCl (KAPVAY) 0.1 MG TB12 ER tablet Take 1 tablet (0.1 mg total) by mouth 2 (two) times daily.  . colchicine 0.6 MG tablet Take 0.6 mg by mouth 2 (two) times daily.  Marland Kitchen escitalopram (LEXAPRO) 20 MG tablet Take 20 mg by mouth daily.  Marland Kitchen ezetimibe (ZETIA) 10 MG tablet Take 10 mg by mouth daily.   . folic acid (FOLVITE) 400 MCG tablet Take 400 mcg by mouth daily.   . furosemide (LASIX) 80 MG tablet Take 1 tablet (80 mg total) by mouth 2 (two) times daily.  Marland Kitchen gabapentin (NEURONTIN) 100 MG tablet Take 100 mg by mouth at bedtime.   . insulin aspart (NOVOLOG) 100 UNIT/ML injection Inject 20 Units into the skin 3 (three) times daily with meals.  . insulin glargine (LANTUS) 100 UNIT/ML injection Inject 59 Units into the skin 2 (two) times daily.   . isosorbide mononitrate (IMDUR) 30 MG 24 hr tablet Take 60 mg by mouth daily.   . metolazone (ZAROXOLYN) 2.5 MG tablet Take 2.5 mg by mouth See admin instructions. Take 30 minutes prior to morning furosemide dose for a weight gain of 3 lbs or more  . Multiple Vitamin (MULTIVITAMIN WITH MINERALS) TABS Take 0.5 tablets by mouth 2 (two) times daily.  . nitroGLYCERIN (NITROSTAT) 0.4 MG SL tablet Place 0.4 mg under the tongue every 5 (five) minutes as needed for chest pain. May repeat up to 3 times  . ONE TOUCH ULTRA TEST test strip   . pantoprazole (PROTONIX) 40 MG tablet Take 40 mg  by mouth daily.   . predniSONE (DELTASONE) 5 MG tablet Take 5 mg by mouth daily.  Marland Kitchen tiotropium (SPIRIVA) 18 MCG inhalation capsule Place 18 mcg into inhaler and inhale at bedtime.    Allergies  Allergen Reactions  . Donepezil Diarrhea  . Heparin Rash and Other (See Comments)    Family stated it almost killed him. Hallucinations and itching.  Marland Kitchen Hydralazine Other (See Comments)    DO NOT TAKE PER MD  . Hydromorphone Itching  . Morphine Itching  . Plavix [Clopidogrel Bisulfate] Other (See Comments)    GI BLEEDING; "so bad I had to take blood; dr took me off it"    . Promethazine Other (See Comments)    respiratory failure  . Sulfonamide Derivatives Itching  . Cefuroxime Other (See Comments)     unkown reaction  . Metoprolol Other (See Comments)    unknown  . Ertapenem Itching and Rash    12/31/2011 pt does not recall allergy or severity    Past Medical History  Diagnosis Date  . Adenomatous colon polyp   . Diverticulosis   . GERD (gastroesophageal reflux disease)   . Hiatal hernia   . Alzheimer disease   . Gout     "hands; backbone; elbows; shoulders"  . Asthma with bronchitis   . COPD (chronic obstructive pulmonary disease)   . Chronic hypoxemic respiratory failure   . OSA (obstructive sleep apnea)     CPAP  . CHF (congestive heart failure)     grade 2 diastolic dysfunction, EF 65-70% on ECHO 12/2011  . Blood transfusion     "I've had about 6 pints" (12/31/2011)  . Pneumonia     intermittent  . Anemia   . GI bleed     GI Dr. Marina Goodell,   . Hypertension   . Peripheral vascular disease     PAD  . Dysrhythmia     "skips"  . Anginal pain   . Myocardial infarction     "they said I had 2 light ones"  . Shortness of breath 12/31/2011    "all the time"  . Type II diabetes mellitus     with complications:  retinopathy, nephrophathy.   . Arthritis     "plenty"  . Chronic lower back pain   . Renal insufficiency     "went on dialysis probably 3 times" Last 04/2011  . Skin cancer     "forehead; arms"  . Depression   . Diverticulosis   . Colon polyps     adenomatous and hyperplastic  . GERD (gastroesophageal reflux disease)   . Hiatal hernia   . Memory deficits 01/11/2013  . Obesity   . Carotid bruit     bilateral  . Gout   . CKD (chronic kidney disease) 03/07/2013  . Hemorrhoids   . AVM (arteriovenous malformation) of colon   . Gastric polyp   . Atrial fibrillation     ROS: Negative except as per HPI  BP 140/52  Pulse 57  Ht 5\' 8"  (1.727 m)  Wt 244 lb 9.6 oz (110.95 kg)  BMI 37.20 kg/m2  SpO2 98%  PHYSICAL EXAM: Pt is  alert and oriented, NAD HEENT: normal Neck: JVP - normal, carotids 2+= with bruits bilaterally, right greater than left  Lungs: CTA bilaterally CV: RRR without murmur or gallop Abd: soft, NT, Positive BS, obese Ext: 1+ pretibial and ankle edema bilaterally, PT pulses are 1+ bilaterally, DP pulses are 1+ on the right and trace on the left Skin:  warm/dry no rash  ASSESSMENT AND PLAN: 1. Lower extremity peripheral arterial disease with severe intermittent claudication. Pedal pulses are palpable. He has advanced renal disease and has had contrast nephropathy in the past. Discussed options with the patient and his wife today. They do not want procedures unless absolutely necessary and I concur with this approach. We'll plan on ongoing medical treatment.  2. Coronary artery disease, native vessel. The patient is status post CABG. He is having anginal symptoms, albeit with some typical and atypical features. Will continue medical treatment.  3. Chronic diastolic heart failure. Stable symptoms and weight on furosemide 80 mg twice daily. Medical program was reviewed and no changes were made today.  4. Malignant hypertension. Blood pressure is controlled on a combination of amlodipine, clonidine, furosemide, and isosorbide.  I'll see him back in 4 months unless problems arise in the interim.  Tonny Bollman 05/02/2013 11:20 AM

## 2013-05-02 NOTE — Patient Instructions (Signed)
Your physician wants you to follow-up in: 4 months with Dr. Cooper You will receive a reminder letter in the mail two months in advance. If you don't receive a letter, please call our office to schedule the follow-up appointment.  Your physician recommends that you continue on your current medications as directed. Please refer to the Current Medication list given to you today.  

## 2013-05-15 ENCOUNTER — Telehealth: Payer: Self-pay | Admitting: Hematology and Oncology

## 2013-05-15 NOTE — Telephone Encounter (Signed)
, °

## 2013-05-26 ENCOUNTER — Encounter: Payer: Self-pay | Admitting: Hematology and Oncology

## 2013-05-26 ENCOUNTER — Ambulatory Visit: Payer: BC Managed Care – PPO | Admitting: Hematology and Oncology

## 2013-05-26 ENCOUNTER — Other Ambulatory Visit (HOSPITAL_BASED_OUTPATIENT_CLINIC_OR_DEPARTMENT_OTHER): Payer: BC Managed Care – PPO

## 2013-05-26 ENCOUNTER — Telehealth: Payer: Self-pay | Admitting: *Deleted

## 2013-05-26 ENCOUNTER — Telehealth: Payer: Self-pay | Admitting: Hematology and Oncology

## 2013-05-26 ENCOUNTER — Other Ambulatory Visit: Payer: Self-pay | Admitting: Hematology and Oncology

## 2013-05-26 ENCOUNTER — Other Ambulatory Visit: Payer: BC Managed Care – PPO

## 2013-05-26 ENCOUNTER — Ambulatory Visit (HOSPITAL_BASED_OUTPATIENT_CLINIC_OR_DEPARTMENT_OTHER): Payer: BC Managed Care – PPO | Admitting: Hematology and Oncology

## 2013-05-26 ENCOUNTER — Other Ambulatory Visit: Payer: Self-pay | Admitting: *Deleted

## 2013-05-26 VITALS — BP 148/75 | HR 64 | Temp 98.0°F | Resp 20 | Ht 68.0 in | Wt 247.1 lb

## 2013-05-26 DIAGNOSIS — K922 Gastrointestinal hemorrhage, unspecified: Secondary | ICD-10-CM

## 2013-05-26 DIAGNOSIS — D638 Anemia in other chronic diseases classified elsewhere: Secondary | ICD-10-CM

## 2013-05-26 DIAGNOSIS — D500 Iron deficiency anemia secondary to blood loss (chronic): Secondary | ICD-10-CM

## 2013-05-26 DIAGNOSIS — D649 Anemia, unspecified: Secondary | ICD-10-CM

## 2013-05-26 DIAGNOSIS — N189 Chronic kidney disease, unspecified: Secondary | ICD-10-CM

## 2013-05-26 LAB — COMPREHENSIVE METABOLIC PANEL (CC13)
ALT: 32 U/L (ref 0–55)
ANION GAP: 13 meq/L — AB (ref 3–11)
AST: 36 U/L — AB (ref 5–34)
Albumin: 3.6 g/dL (ref 3.5–5.0)
Alkaline Phosphatase: 78 U/L (ref 40–150)
BILIRUBIN TOTAL: 0.47 mg/dL (ref 0.20–1.20)
BUN: 45.5 mg/dL — ABNORMAL HIGH (ref 7.0–26.0)
CO2: 25 mEq/L (ref 22–29)
CREATININE: 2.2 mg/dL — AB (ref 0.7–1.3)
Calcium: 9.1 mg/dL (ref 8.4–10.4)
Chloride: 98 mEq/L (ref 98–109)
Glucose: 416 mg/dl — ABNORMAL HIGH (ref 70–140)
Potassium: 4.6 mEq/L (ref 3.5–5.1)
Sodium: 135 mEq/L — ABNORMAL LOW (ref 136–145)
Total Protein: 7 g/dL (ref 6.4–8.3)

## 2013-05-26 LAB — CBC & DIFF AND RETIC
BASO%: 0.3 % (ref 0.0–2.0)
Basophils Absolute: 0 10*3/uL (ref 0.0–0.1)
EOS%: 8 % — ABNORMAL HIGH (ref 0.0–7.0)
Eosinophils Absolute: 0.6 10*3/uL — ABNORMAL HIGH (ref 0.0–0.5)
HEMATOCRIT: 36.6 % — AB (ref 38.4–49.9)
HGB: 11.8 g/dL — ABNORMAL LOW (ref 13.0–17.1)
IMMATURE RETIC FRACT: 18.7 % — AB (ref 3.00–10.60)
LYMPH#: 2 10*3/uL (ref 0.9–3.3)
LYMPH%: 25.8 % (ref 14.0–49.0)
MCH: 29.4 pg (ref 27.2–33.4)
MCHC: 32.2 g/dL (ref 32.0–36.0)
MCV: 91 fL (ref 79.3–98.0)
MONO#: 0.4 10*3/uL (ref 0.1–0.9)
MONO%: 5.4 % (ref 0.0–14.0)
NEUT#: 4.7 10*3/uL (ref 1.5–6.5)
NEUT%: 60.5 % (ref 39.0–75.0)
NRBC: 0 % (ref 0–0)
Platelets: 163 10*3/uL (ref 140–400)
RBC: 4.02 10*6/uL — ABNORMAL LOW (ref 4.20–5.82)
RDW: 17.6 % — AB (ref 11.0–14.6)
RETIC %: 2.29 % — AB (ref 0.80–1.80)
Retic Ct Abs: 92.06 10*3/uL (ref 34.80–93.90)
WBC: 7.7 10*3/uL (ref 4.0–10.3)

## 2013-05-26 LAB — FERRITIN CHCC: FERRITIN: 34 ng/mL (ref 22–316)

## 2013-05-26 NOTE — Telephone Encounter (Signed)
I left a VM earlier. He dropped his ferritin from 171 to 43

## 2013-05-26 NOTE — Telephone Encounter (Signed)
s.w. pt and advised that 53mth appt needed to be changed to 82mth per MD pof request...pt ok and aware

## 2013-05-26 NOTE — Telephone Encounter (Signed)
Informed pt of blood sugar 416 today at [facility].   Pt states just checked his blood sugar at home within past hour and it was 214.  He will continue to monitor it and call Dr. Arelia Sneddon if it continues to be elevated.

## 2013-05-26 NOTE — Telephone Encounter (Signed)
Pt asking why his next visit w/ Dr. [name] has been changed from in 4 months to in one month?

## 2013-05-26 NOTE — Telephone Encounter (Signed)
Message copied by Cathlean Cower on Fri May 26, 2013  3:03 PM ------      Message from: [facility] [place]: Fri May 26, 2013  2:33 PM      Regarding: appt       Due to abnormal test result, his appt is moved to 1 month instead of 4 months      ----- Message -----         From: [facility] Interface         Sent: 05/26/2013  12:26 PM           To: Heath Lark, MD                   ------

## 2013-05-26 NOTE — Progress Notes (Signed)
[facility] OFFICE PROGRESS NOTE  [name], MD DIAGNOSIS:  Chronic anemia, secondary to chronic GI bleed for arterial venous malformation, on chronic antiplatelet agents for peripheral vascular disease  SUMMARY OF HEMATOLOGIC HISTORY: His last normal CBC was around 2008 with a hemoglobin of 14. Something around 2012, he had worsening anemia secondary to bleeding AVM. Plavix was discontinued. Between 2012 to 2014, Chronic GI bleed and most recently his antiplatelet agents was put on hold due to frequent bleeding. The patient requires some blood transfusions intermittently this year. October 2014, he received 1 dose of intravenous iron infusion INTERVAL HISTORY: PAXSON HARROWER 80 y.o. male returns for further followup. He has fantastic holidays and gained some weight. He denies any signs and symptoms of anemia apart From mild fatigue. The patient denies any recent signs or symptoms of bleeding such as spontaneous epistaxis, hematuria or hematochezia.  I have reviewed the past medical history, past surgical history, social history and family history with the patient and they are unchanged from previous note.  ALLERGIES:  is allergic to donepezil; heparin; hydralazine; hydromorphone; morphine; plavix; promethazine; sulfonamide derivatives; cefuroxime; metoprolol; and ertapenem.  MEDICATIONS:  Current Outpatient Prescriptions  Medication Sig Dispense Refill  . acetaminophen (TYLENOL) 650 MG CR tablet Take 1,300 mg by mouth 2 (two) times daily.      Marland Kitchen albuterol (PROAIR HFA) 108 (90 BASE) MCG/ACT inhaler Inhale 2 puffs into the lungs daily as needed for wheezing or shortness of breath.      Marland Kitchen albuterol (PROVENTIL) (2.5 MG/3ML) 0.083% nebulizer solution Take 2.5 mg by nebulization 4 (four) times daily.      Marland Kitchen allopurinol (ZYLOPRIM) 300 MG tablet Take 300 mg by mouth 2 (two) times daily.      Marland Kitchen amLODipine (NORVASC) 10 MG tablet Take 10 mg by mouth daily.      Marland Kitchen aspirin  EC 325 MG tablet Take 1 tablet (325 mg total) by mouth daily.  30 tablet  0  . atorvastatin (LIPITOR) 20 MG tablet Take 20 mg by mouth daily.       . B-D UF III MINI PEN NEEDLES 31G X 5 MM MISC       . cloNIDine HCl (KAPVAY) 0.1 MG TB12 ER tablet Take 1 tablet (0.1 mg total) by mouth 2 (two) times daily.  60 tablet  5  . colchicine 0.6 MG tablet Take 0.6 mg by mouth 2 (two) times daily.      Marland Kitchen escitalopram (LEXAPRO) 20 MG tablet Take 20 mg by mouth daily.      Marland Kitchen ezetimibe (ZETIA) 10 MG tablet Take 10 mg by mouth daily.       . folic acid (FOLVITE) 284 MCG tablet Take 400 mcg by mouth daily.       . furosemide (LASIX) 80 MG tablet Take 1 tablet (80 mg total) by mouth 2 (two) times daily.  180 tablet  3  . gabapentin (NEURONTIN) 100 MG tablet Take 100 mg by mouth at bedtime.       . insulin aspart (NOVOLOG) 100 UNIT/ML injection Inject 20 Units into the skin 3 (three) times daily with meals.      . insulin glargine (LANTUS) 100 UNIT/ML injection Inject 59 Units into the skin 2 (two) times daily.       . isosorbide mononitrate (IMDUR) 30 MG 24 hr tablet Take 60 mg by mouth daily.       . metolazone (ZAROXOLYN) 2.5 MG tablet Take 2.5 mg by mouth See admin  instructions. Take 30 minutes prior to morning furosemide dose for a weight gain of 3 lbs or more      . Multiple Vitamin (MULTIVITAMIN WITH MINERALS) TABS Take 0.5 tablets by mouth 2 (two) times daily.      . nitroGLYCERIN (NITROSTAT) 0.4 MG SL tablet Place 0.4 mg under the tongue every 5 (five) minutes as needed for chest pain. May repeat up to 3 times      . ONE TOUCH ULTRA TEST test strip       . pantoprazole (PROTONIX) 40 MG tablet Take 40 mg by mouth daily.       . predniSONE (DELTASONE) 5 MG tablet Take 5 mg by mouth daily.      Marland Kitchen tiotropium (SPIRIVA) 18 MCG inhalation capsule Place 18 mcg into inhaler and inhale at bedtime.       No current facility-administered medications for this visit.     REVIEW OF SYSTEMS:   Constitutional: Denies  fevers, chills or night sweats Behavioral/Psych: Mood is stable, no new changes  All other systems were reviewed with the patient and are negative.  PHYSICAL EXAMINATION: ECOG PERFORMANCE STATUS: 1 - Symptomatic but completely ambulatory  Filed Vitals:   05/26/13 1227  BP: 148/75  Pulse: 64  Temp: 98 F (36.7 C)  Resp: 20   Filed Weights   05/26/13 1227  Weight: 247 lb 1.6 oz (112.084 kg)    GENERAL:alert, no distress and comfortable. He is morbidly obese SKIN: skin color, texture, turgor are normal, no rashes or significant lesions Musculoskeletal:no cyanosis of digits and no clubbing  NEURO: alert & oriented x 3 with fluent speech, no focal motor/sensory deficits  LABORATORY DATA:  I have reviewed the data as listed Results for orders placed in visit on 05/26/13 (from the past 48 hour(s))  CBC & DIFF AND RETIC     Status: Abnormal   Collection Time    05/26/13 11:58 AM      Result Value Range   WBC 7.7  4.0 - 10.3 10e3/uL   NEUT# 4.7  1.5 - 6.5 10e3/uL   HGB 11.8 (*) 13.0 - 17.1 g/dL   HCT 36.6 (*) 38.4 - 49.9 %   Platelets 163  140 - 400 10e3/uL   MCV 91.0  79.3 - 98.0 fL   MCH 29.4  27.2 - 33.4 pg   MCHC 32.2  32.0 - 36.0 g/dL   RBC 4.02 (*) 4.20 - 5.82 10e6/uL   RDW 17.6 (*) 11.0 - 14.6 %   lymph# 2.0  0.9 - 3.3 10e3/uL   MONO# 0.4  0.1 - 0.9 10e3/uL   Eosinophils Absolute 0.6 (*) 0.0 - 0.5 10e3/uL   Basophils Absolute 0.0  0.0 - 0.1 10e3/uL   NEUT% 60.5  39.0 - 75.0 %   LYMPH% 25.8  14.0 - 49.0 %   MONO% 5.4  0.0 - 14.0 %   EOS% 8.0 (*) 0.0 - 7.0 %   BASO% 0.3  0.0 - 2.0 %   nRBC 0  0 - 0 %   Retic % 2.29 (*) 0.80 - 1.80 %   Retic Ct Abs 92.06  34.80 - 93.90 10e3/uL   Immature Retic Fract 18.70 (*) 3.00 - 10.60 %    Lab Results  Component Value Date   WBC 7.7 05/26/2013   HGB 11.8* 05/26/2013   HCT 36.6* 05/26/2013   MCV 91.0 05/26/2013   PLT 163 05/26/2013   ASSESSMENT & PLAN:  #1 multifactorial anemia This is a combination  of iron deficiency from  chronic GI bleed as well as anemia chronic disease from chronic kidney disease. The patient also has intermittent hematochezia secondary to hemorrhoids He responded well with intravenous iron therapy and has gained 2 g of hemoglobin since then. His ferritin level is pending. Recent iron study from November show ferritin was 171 I recommend we hold off erythropoietin stimulating agents as his hemoglobin has improved significantly with recent intravenous iron infusion. I will see him back in 4 months with repeat blood work and iron study. If repeat ferritin is over 50 and despite this to have persistent anemia with hemoglobin less than 10 g, we will consider erythropoietin stimulating agents #2 hemorrhoidal bleeding resolved,  I recommend stool softener to avoid straining for hard stool #3 history of coronary artery disease The patient is no longer on Plavix. He has resumed taking aspirin. He will continue for now unless he has significant GI bleed in the future.  All questions were answered. The patient knows to call the clinic with any problems, questions or concerns. No barriers to learning was detected.  I spent 15 minutes counseling the patient face to face. The total time spent in the appointment was 20 minutes and more than 50% was on counseling.     [facility], [place], MD 05/26/2013 12:39 PM

## 2013-05-26 NOTE — Telephone Encounter (Signed)
gv and printed appt sched and avs for pt for May 2015 °

## 2013-05-26 NOTE — Telephone Encounter (Signed)
Left Vm for pt informing of reason Dr. [name] moved his f/u appt up to in one month.  Asked him to return call if any questions.

## 2013-06-26 ENCOUNTER — Ambulatory Visit (HOSPITAL_BASED_OUTPATIENT_CLINIC_OR_DEPARTMENT_OTHER): Payer: BC Managed Care – PPO | Admitting: Hematology and Oncology

## 2013-06-26 ENCOUNTER — Other Ambulatory Visit (HOSPITAL_BASED_OUTPATIENT_CLINIC_OR_DEPARTMENT_OTHER): Payer: BC Managed Care – PPO

## 2013-06-26 VITALS — BP 138/52 | HR 58 | Temp 97.6°F | Resp 18 | Ht 68.0 in | Wt 245.6 lb

## 2013-06-26 DIAGNOSIS — I739 Peripheral vascular disease, unspecified: Secondary | ICD-10-CM

## 2013-06-26 DIAGNOSIS — E669 Obesity, unspecified: Secondary | ICD-10-CM

## 2013-06-26 DIAGNOSIS — D649 Anemia, unspecified: Secondary | ICD-10-CM

## 2013-06-26 DIAGNOSIS — D638 Anemia in other chronic diseases classified elsewhere: Secondary | ICD-10-CM

## 2013-06-26 DIAGNOSIS — I2510 Atherosclerotic heart disease of native coronary artery without angina pectoris: Secondary | ICD-10-CM

## 2013-06-26 DIAGNOSIS — E119 Type 2 diabetes mellitus without complications: Secondary | ICD-10-CM

## 2013-06-26 DIAGNOSIS — N189 Chronic kidney disease, unspecified: Secondary | ICD-10-CM

## 2013-06-26 DIAGNOSIS — N183 Chronic kidney disease, stage 3 unspecified: Secondary | ICD-10-CM

## 2013-06-26 DIAGNOSIS — K922 Gastrointestinal hemorrhage, unspecified: Secondary | ICD-10-CM

## 2013-06-26 DIAGNOSIS — R5383 Other fatigue: Secondary | ICD-10-CM

## 2013-06-26 DIAGNOSIS — R5381 Other malaise: Secondary | ICD-10-CM

## 2013-06-26 LAB — COMPREHENSIVE METABOLIC PANEL (CC13)
ALBUMIN: 3.5 g/dL (ref 3.5–5.0)
ALT: 32 U/L (ref 0–55)
ANION GAP: 14 meq/L — AB (ref 3–11)
AST: 40 U/L — AB (ref 5–34)
Alkaline Phosphatase: 80 U/L (ref 40–150)
BUN: 37.2 mg/dL — ABNORMAL HIGH (ref 7.0–26.0)
CALCIUM: 9.1 mg/dL (ref 8.4–10.4)
CHLORIDE: 100 meq/L (ref 98–109)
CO2: 25 mEq/L (ref 22–29)
CREATININE: 1.9 mg/dL — AB (ref 0.7–1.3)
Glucose: 269 mg/dl — ABNORMAL HIGH (ref 70–140)
POTASSIUM: 4.2 meq/L (ref 3.5–5.1)
Sodium: 139 mEq/L (ref 136–145)
TOTAL PROTEIN: 6.8 g/dL (ref 6.4–8.3)
Total Bilirubin: 0.49 mg/dL (ref 0.20–1.20)

## 2013-06-26 LAB — CBC & DIFF AND RETIC
BASO%: 0.4 % (ref 0.0–2.0)
BASOS ABS: 0 10*3/uL (ref 0.0–0.1)
EOS%: 8.1 % — AB (ref 0.0–7.0)
Eosinophils Absolute: 0.8 10*3/uL — ABNORMAL HIGH (ref 0.0–0.5)
HEMATOCRIT: 38 % — AB (ref 38.4–49.9)
HGB: 12.3 g/dL — ABNORMAL LOW (ref 13.0–17.1)
IMMATURE RETIC FRACT: 12.9 % — AB (ref 3.00–10.60)
LYMPH#: 1.7 10*3/uL (ref 0.9–3.3)
LYMPH%: 17.2 % (ref 14.0–49.0)
MCH: 29.6 pg (ref 27.2–33.4)
MCHC: 32.4 g/dL (ref 32.0–36.0)
MCV: 91.6 fL (ref 79.3–98.0)
MONO#: 0.7 10*3/uL (ref 0.1–0.9)
MONO%: 6.9 % (ref 0.0–14.0)
NEUT#: 6.8 10*3/uL — ABNORMAL HIGH (ref 1.5–6.5)
NEUT%: 67.4 % (ref 39.0–75.0)
Platelets: 178 10*3/uL (ref 140–400)
RBC: 4.15 10*6/uL — ABNORMAL LOW (ref 4.20–5.82)
RDW: 16.9 % — ABNORMAL HIGH (ref 11.0–14.6)
Retic %: 2.13 % — ABNORMAL HIGH (ref 0.80–1.80)
Retic Ct Abs: 88.4 10*3/uL (ref 34.80–93.90)
WBC: 10.1 10*3/uL (ref 4.0–10.3)

## 2013-06-26 LAB — FERRITIN CHCC: Ferritin: 36 ng/ml (ref 22–316)

## 2013-06-26 NOTE — Progress Notes (Signed)
[name] OFFICE PROGRESS NOTE  Thomas Downing, MD DIAGNOSIS:  Chronic anemia, multifactorial due to to chronic GI bleed from arteriovenous malformation, on chronic antiplatelet agents for peripheral vascular disease and anemia chronic disease from chronic kidney failure  SUMMARY OF HEMATOLOGIC HISTORY: His last normal CBC was around 2008 with a hemoglobin of 14. Something around 2012, he had worsening anemia secondary to bleeding AVM. Plavix was discontinued. Between 2012 to 2014, Chronic GI bleed and most recently his antiplatelet agents was put on hold due to frequent bleeding. The patient requires some blood transfusions intermittently this year. October 2014, he received 1 dose of intravenous iron infusion INTERVAL HISTORY: [name] 80 y.o. male returns for further followup. He complained of chronic fatigue. Denies any chest pain, shortness of breath or dizziness. The patient denies any recent signs or symptoms of bleeding such as spontaneous epistaxis, hematuria or hematochezia.  I have reviewed the past medical history, past surgical history, social history and family history with the patient and they are unchanged from previous note.  ALLERGIES:  is allergic to donepezil; heparin; hydralazine; hydromorphone; morphine; plavix; promethazine; sulfonamide derivatives; cefuroxime; metoprolol; and ertapenem.  MEDICATIONS:  Current Outpatient Prescriptions  Medication Sig Dispense Refill  . acetaminophen (TYLENOL) 650 MG CR tablet Take 1,300 mg by mouth 2 (two) times daily.      Marland Kitchen albuterol (PROAIR HFA) 108 (90 BASE) MCG/ACT inhaler Inhale 2 puffs into the lungs daily as needed for wheezing or shortness of breath.      Marland Kitchen albuterol (PROVENTIL) (2.5 MG/3ML) 0.083% nebulizer solution Take 2.5 mg by nebulization 4 (four) times daily.      Marland Kitchen allopurinol (ZYLOPRIM) 300 MG tablet Take 300 mg by mouth 2 (two) times daily.      Marland Kitchen amLODipine (NORVASC) 10 MG tablet Take  10 mg by mouth daily.      Marland Kitchen aspirin EC 325 MG tablet Take 1 tablet (325 mg total) by mouth daily.  30 tablet  0  . atorvastatin (LIPITOR) 20 MG tablet Take 20 mg by mouth daily.       . B-D UF III MINI PEN NEEDLES 31G X 5 MM MISC       . cloNIDine HCl (KAPVAY) 0.1 MG TB12 ER tablet Take 1 tablet (0.1 mg total) by mouth 2 (two) times daily.  60 tablet  5  . colchicine 0.6 MG tablet Take 0.6 mg by mouth 2 (two) times daily.      Marland Kitchen escitalopram (LEXAPRO) 20 MG tablet Take 20 mg by mouth daily.      Marland Kitchen ezetimibe (ZETIA) 10 MG tablet Take 10 mg by mouth daily.       . folic acid (FOLVITE) A999333 MCG tablet Take 400 mcg by mouth daily.       . furosemide (LASIX) 80 MG tablet Take 1 tablet (80 mg total) by mouth 2 (two) times daily.  180 tablet  3  . gabapentin (NEURONTIN) 100 MG tablet Take 100 mg by mouth at bedtime.       . insulin aspart (NOVOLOG) 100 UNIT/ML injection Inject 20 Units into the skin 3 (three) times daily with meals.      . insulin glargine (LANTUS) 100 UNIT/ML injection Inject 59 Units into the skin 2 (two) times daily.       . isosorbide mononitrate (IMDUR) 30 MG 24 hr tablet Take 60 mg by mouth daily.       . metolazone (ZAROXOLYN) 2.5 MG tablet Take 2.5 mg by  mouth See admin instructions. Take 30 minutes prior to morning furosemide dose for a weight gain of 3 lbs or more      . Multiple Vitamin (MULTIVITAMIN WITH MINERALS) TABS Take 0.5 tablets by mouth 2 (two) times daily.      . nitroGLYCERIN (NITROSTAT) 0.4 MG SL tablet Place 0.4 mg under the tongue every 5 (five) minutes as needed for chest pain. May repeat up to 3 times      . ONE TOUCH ULTRA TEST test strip       . pantoprazole (PROTONIX) 40 MG tablet Take 40 mg by mouth daily.       . predniSONE (DELTASONE) 5 MG tablet Take 5 mg by mouth daily.      Marland Kitchen tiotropium (SPIRIVA) 18 MCG inhalation capsule Place 18 mcg into inhaler and inhale at bedtime.       No current facility-administered medications for this visit.     REVIEW  OF SYSTEMS:   Constitutional: Denies fevers, chills or night sweats Eyes: Denies blurriness of vision Ears, nose, mouth, throat, and face: Denies mucositis or sore throat Respiratory: Denies cough, dyspnea or wheezes Cardiovascular: Denies palpitation, chest discomfort or lower extremity swelling Gastrointestinal:  Denies nausea, heartburn or change in bowel habits Skin: Denies abnormal skin rashes Lymphatics: Denies new lymphadenopathy or easy bruising Neurological:Denies numbness, tingling or new weaknesses Behavioral/Psych: Mood is stable, no new changes  All other systems were reviewed with the patient and are negative.  PHYSICAL EXAMINATION: ECOG PERFORMANCE STATUS: 1 - Symptomatic but completely ambulatory  Filed Vitals:   06/26/13 0833  BP: 138/52  Pulse: 58  Temp: 97.6 F (36.4 C)  Resp: 18   Filed Weights   06/26/13 0833  Weight: 245 lb 9.6 oz (111.403 kg)    GENERAL:alert, no distress and comfortable. Patient is morbidly obese SKIN: skin color, texture, turgor are normal, no rashes or significant lesions apart from chronic bruising Musculoskeletal:no cyanosis of digits and no clubbing  NEURO: alert & oriented x 3 with fluent speech, no focal motor/sensory deficits  LABORATORY DATA:  I have reviewed the data as listed Results for orders placed in visit on 06/26/13 (from the past 48 hour(s))  CBC & DIFF AND RETIC     Status: Abnormal   Collection Time    06/26/13  8:08 AM      Result Value Range   WBC 10.1  4.0 - 10.3 10e3/uL   NEUT# 6.8 (*) 1.5 - 6.5 10e3/uL   HGB 12.3 (*) 13.0 - 17.1 g/dL   HCT 38.0 (*) 38.4 - 49.9 %   Platelets 178  140 - 400 10e3/uL   MCV 91.6  79.3 - 98.0 fL   MCH 29.6  27.2 - 33.4 pg   MCHC 32.4  32.0 - 36.0 g/dL   RBC 4.15 (*) 4.20 - 5.82 10e6/uL   RDW 16.9 (*) 11.0 - 14.6 %   lymph# 1.7  0.9 - 3.3 10e3/uL   MONO# 0.7  0.1 - 0.9 10e3/uL   Eosinophils Absolute 0.8 (*) 0.0 - 0.5 10e3/uL   Basophils Absolute 0.0  0.0 - 0.1 10e3/uL    NEUT% 67.4  39.0 - 75.0 %   LYMPH% 17.2  14.0 - 49.0 %   MONO% 6.9  0.0 - 14.0 %   EOS% 8.1 (*) 0.0 - 7.0 %   BASO% 0.4  0.0 - 2.0 %   Retic % 2.13 (*) 0.80 - 1.80 %   Retic Ct Abs 88.40  34.80 - 93.90  10e3/uL   Immature Retic Fract 12.90 (*) 3.00 - 10.60 %  COMPREHENSIVE METABOLIC PANEL (ZO10)     Status: Abnormal   Collection Time    06/26/13  8:08 AM      Result Value Range   Sodium 139  136 - 145 mEq/L   Potassium 4.2  3.5 - 5.1 mEq/L   Chloride 100  98 - 109 mEq/L   CO2 25  22 - 29 mEq/L   Glucose 269 (*) 70 - 140 mg/dl   BUN 37.2 (*) 7.0 - 26.0 mg/dL   Creatinine 1.9 (*) 0.7 - 1.3 mg/dL   Total Bilirubin 0.49  0.20 - 1.20 mg/dL   Alkaline Phosphatase 80  40 - 150 U/L   AST 40 (*) 5 - 34 U/L   ALT 32  0 - 55 U/L   Total Protein 6.8  6.4 - 8.3 g/dL   Albumin 3.5  3.5 - 5.0 g/dL   Calcium 9.1  8.4 - 10.4 mg/dL   Anion Gap 14 (*) 3 - 11 mEq/L    Lab Results  Component Value Date   WBC 10.1 06/26/2013   HGB 12.3* 06/26/2013   HCT 38.0* 06/26/2013   MCV 91.6 06/26/2013   PLT 178 06/26/2013    ASSESSMENT & PLAN:  #1 chronic anemia #2 history of iron deficiency #3 peripheral vascular disease and coronary artery disease, on chronic antiplatelet agents #4 history of hemorrhoidal bleeding, resolved #5 fatigue due to poorly controlled diabetes This is multifactorial, related to anemia of chronic disease, history of chronic GI bleed, and chronic kidney failure. He received 1 dose of iron infusion recently. His last iron studies suggest a low ferritin but his hemoglobin continues to improve. The patient is on chronic antiplatelet agents for his History of coronary artery disease.  He denies any recent hemorrhoidal bleeding. I recommend he continue followup with his primary care provider every 3-4 months with repeat CBC. I have no plans of making a return appointment for him, unless the hemoglobin dropped to less than 10 g. At present time, there is no indication to start  erythropoietin stimulating agents for chronic kidney failure as his hemoglobin is well over 12 g. Suspect his chronic fatigue is due to poorly controlled diabetes. I recommend he reduce oral carbohydrates intake and attempt weight loss. He will continue his current diabetes medicine as directed by his primary care provider. All questions were answered. The patient knows to call the clinic with any problems, questions or concerns. No barriers to learning was detected.  I spent 15 minutes counseling the patient face to face. The total time spent in the appointment was 20 minutes and more than 50% was on counseling.     [place], [place], MD 06/26/2013 9:07 AM

## 2013-07-14 ENCOUNTER — Ambulatory Visit: Payer: BC Managed Care – PPO | Admitting: Nurse Practitioner

## 2013-07-14 IMAGING — CR DG CHEST 1V PORT
2 series · 2 of 2 positions shown · non-contrast
Comparison: 07/11/2012.

CLINICAL DATA: Shortness of breath..  Chest pain.

PORTABLE CHEST - 1 VIEW

[AP (1 of 2)]
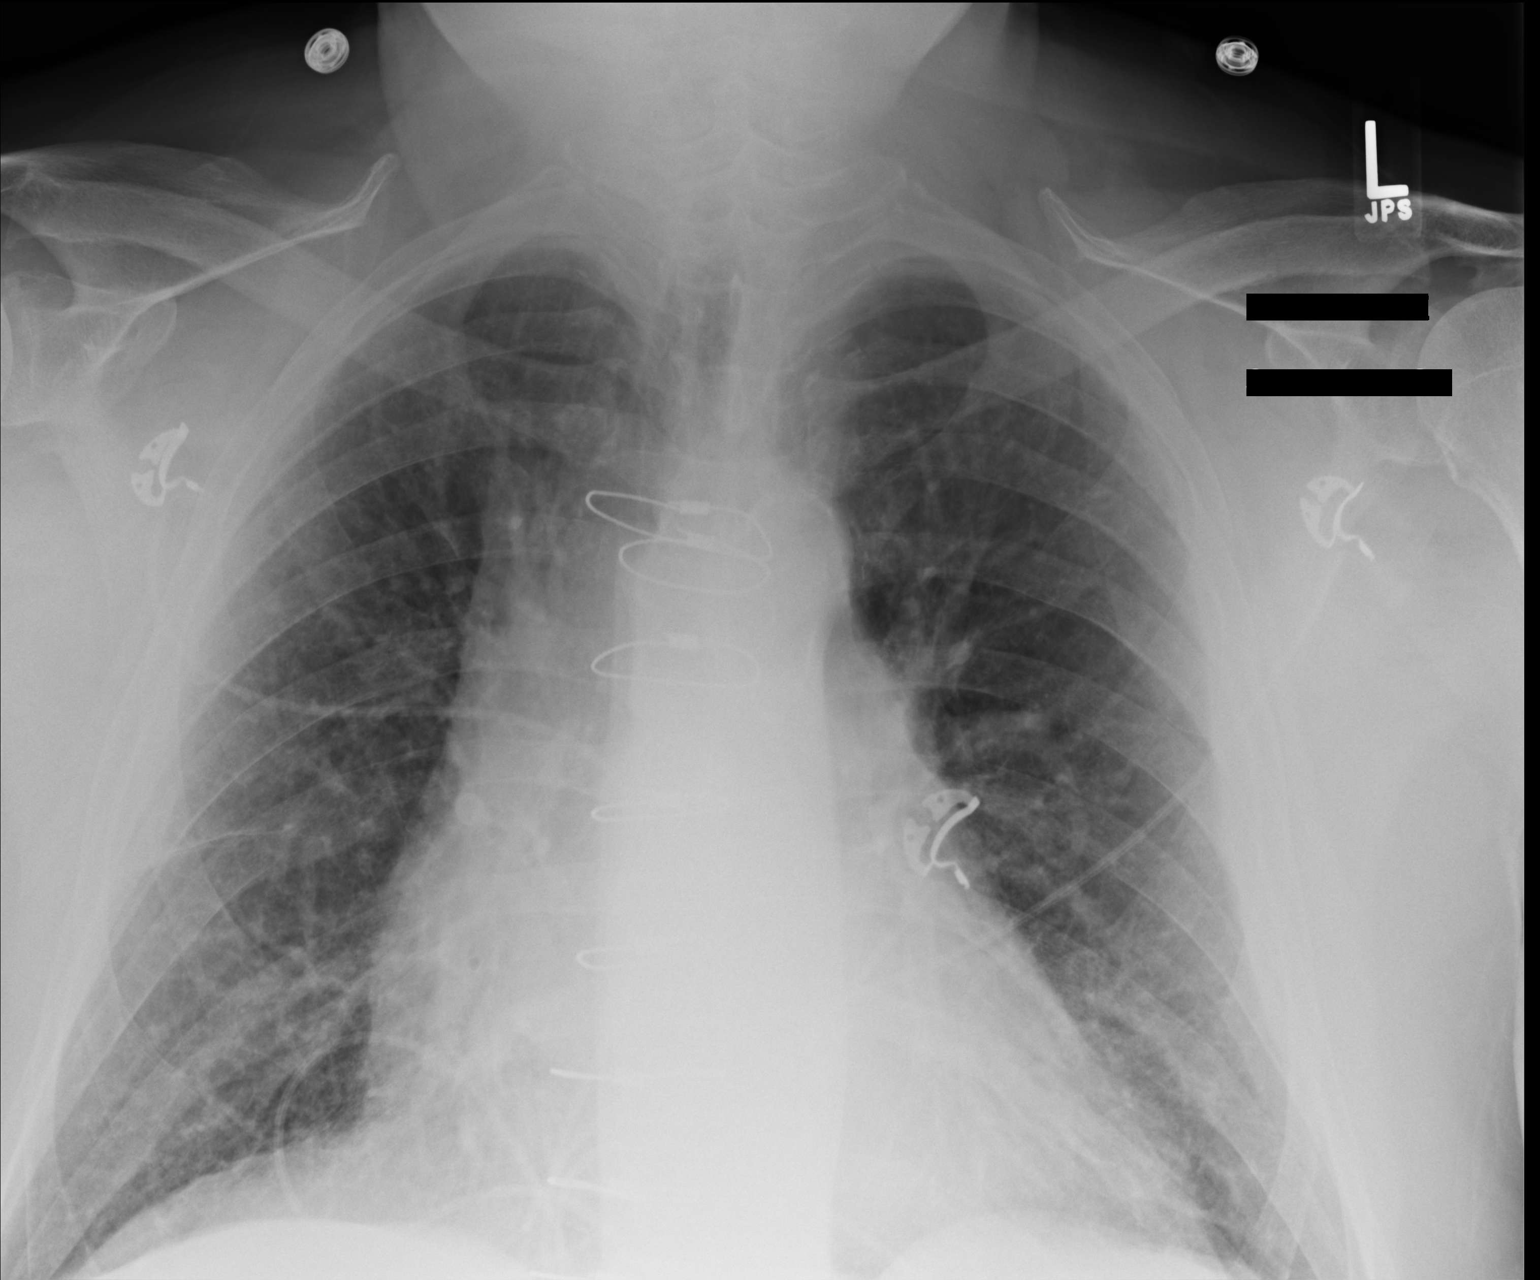

[AP (2 of 2)]
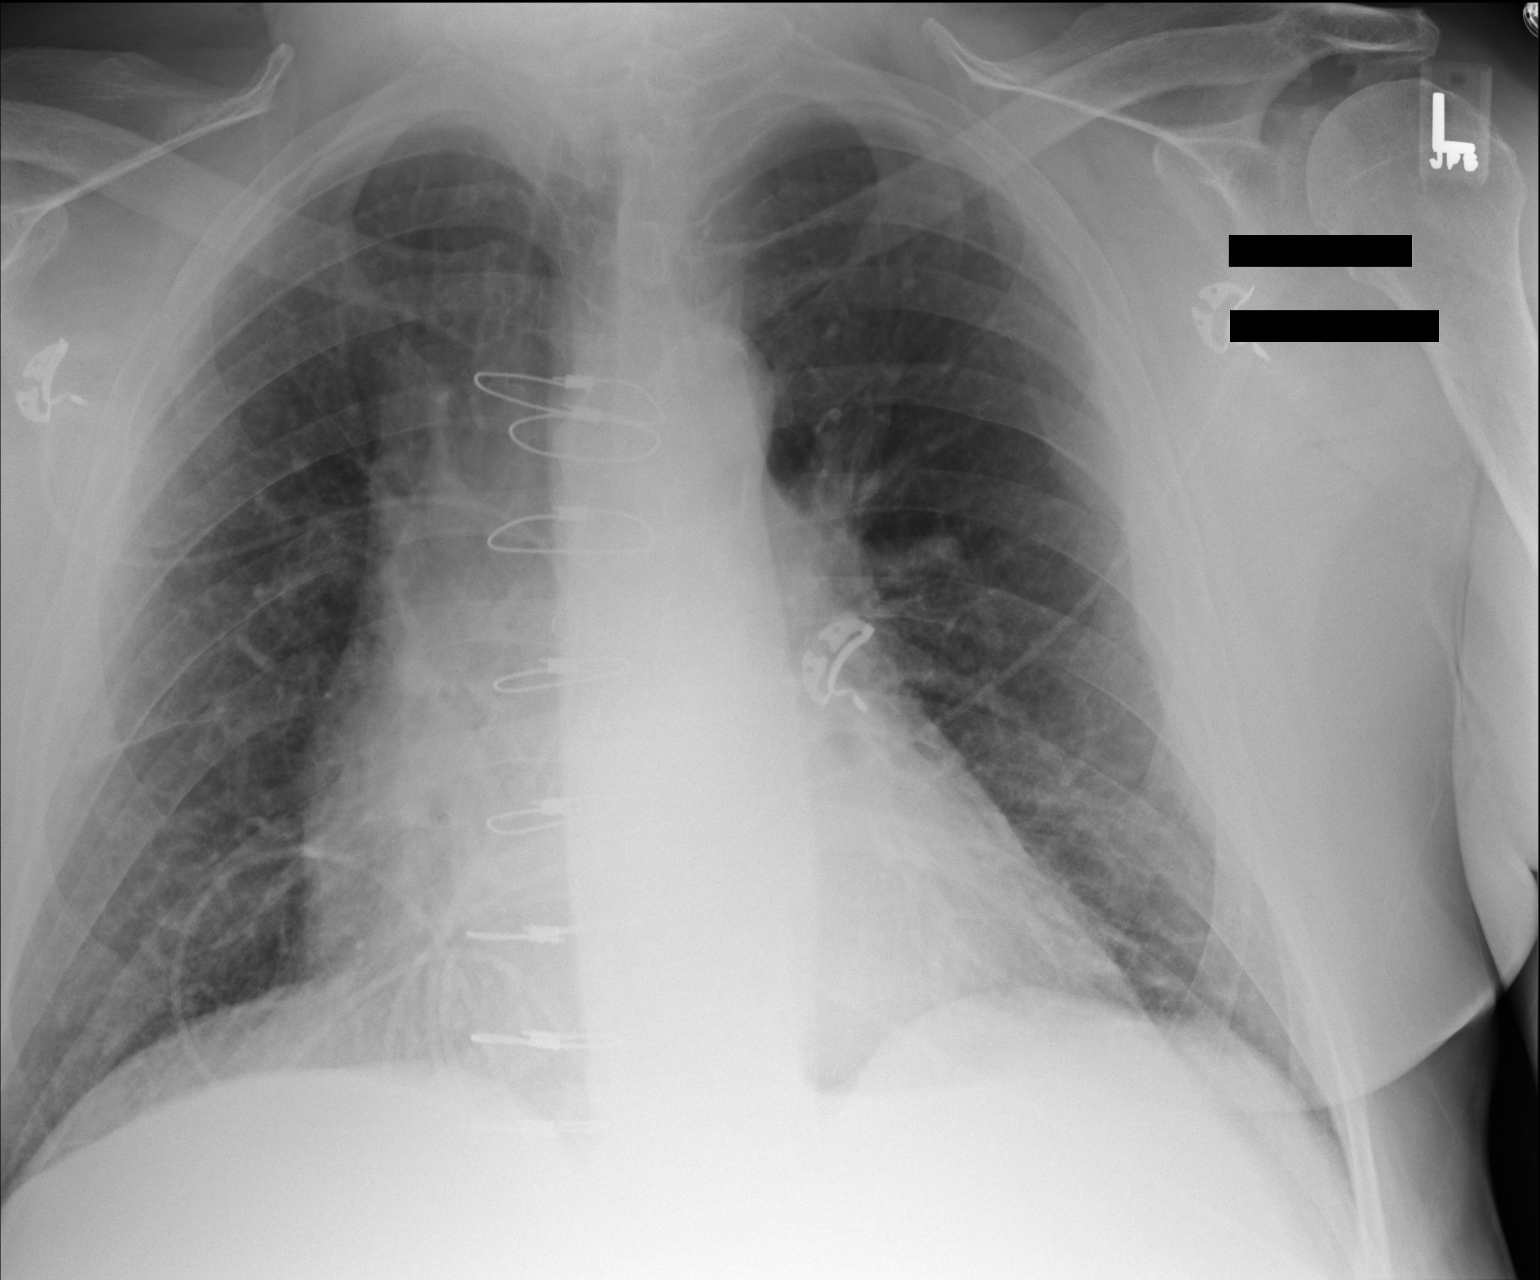

[2 of 2 positions shown; findings below may reference images not displayed]

FINDINGS: Right costophrenic angle not entirely included on the
present exam.

Post mediastinotomy.

Cardiomegaly.

Pulmonary vascular prominence most notable centrally.

No segmental infiltrate or gross pneumothorax.

Calcified aorta.
IMPRESSION: Cardiomegaly.

Mild pulmonary vascular prominence most notable centrally and
without change.

Calcified aorta.

## 2013-09-25 ENCOUNTER — Ambulatory Visit: Payer: BC Managed Care – PPO | Admitting: Hematology and Oncology

## 2013-09-25 ENCOUNTER — Other Ambulatory Visit: Payer: BC Managed Care – PPO

## 2013-10-14 ENCOUNTER — Inpatient Hospital Stay (HOSPITAL_COMMUNITY)
Admission: EM | Admit: 2013-10-14 | Discharge: 2013-10-19 | DRG: 193 | Disposition: A | Discharge status: Home / Self Care | Payer: BC Managed Care – PPO | Attending: Oncology | Admitting: Oncology

## 2013-10-14 ENCOUNTER — Encounter (HOSPITAL_COMMUNITY): Payer: Self-pay | Admitting: Emergency Medicine

## 2013-10-14 ENCOUNTER — Emergency Department (HOSPITAL_COMMUNITY): Payer: BC Managed Care – PPO

## 2013-10-14 DIAGNOSIS — Z860100 Personal history of colon polyps, unspecified: Secondary | ICD-10-CM

## 2013-10-14 DIAGNOSIS — N183 Chronic kidney disease, stage 3 unspecified: Secondary | ICD-10-CM | POA: Diagnosis present

## 2013-10-14 DIAGNOSIS — M545 Low back pain, unspecified: Secondary | ICD-10-CM | POA: Diagnosis present

## 2013-10-14 DIAGNOSIS — I252 Old myocardial infarction: Secondary | ICD-10-CM

## 2013-10-14 DIAGNOSIS — I214 Non-ST elevation (NSTEMI) myocardial infarction: Secondary | ICD-10-CM

## 2013-10-14 DIAGNOSIS — G4733 Obstructive sleep apnea (adult) (pediatric): Secondary | ICD-10-CM | POA: Diagnosis present

## 2013-10-14 DIAGNOSIS — Z87891 Personal history of nicotine dependence: Secondary | ICD-10-CM

## 2013-10-14 DIAGNOSIS — I2510 Atherosclerotic heart disease of native coronary artery without angina pectoris: Secondary | ICD-10-CM | POA: Diagnosis present

## 2013-10-14 DIAGNOSIS — J9620 Acute and chronic respiratory failure, unspecified whether with hypoxia or hypercapnia: Secondary | ICD-10-CM | POA: Diagnosis present

## 2013-10-14 DIAGNOSIS — R9431 Abnormal electrocardiogram [ECG] [EKG]: Secondary | ICD-10-CM

## 2013-10-14 DIAGNOSIS — I5032 Chronic diastolic (congestive) heart failure: Secondary | ICD-10-CM | POA: Diagnosis present

## 2013-10-14 DIAGNOSIS — K219 Gastro-esophageal reflux disease without esophagitis: Secondary | ICD-10-CM | POA: Diagnosis present

## 2013-10-14 DIAGNOSIS — Z800 Family history of malignant neoplasm of digestive organs: Secondary | ICD-10-CM

## 2013-10-14 DIAGNOSIS — R079 Chest pain, unspecified: Secondary | ICD-10-CM

## 2013-10-14 DIAGNOSIS — Z9849 Cataract extraction status, unspecified eye: Secondary | ICD-10-CM

## 2013-10-14 DIAGNOSIS — Z951 Presence of aortocoronary bypass graft: Secondary | ICD-10-CM

## 2013-10-14 DIAGNOSIS — Z85828 Personal history of other malignant neoplasm of skin: Secondary | ICD-10-CM

## 2013-10-14 DIAGNOSIS — Z885 Allergy status to narcotic agent status: Secondary | ICD-10-CM

## 2013-10-14 DIAGNOSIS — Z9089 Acquired absence of other organs: Secondary | ICD-10-CM

## 2013-10-14 DIAGNOSIS — I509 Heart failure, unspecified: Secondary | ICD-10-CM | POA: Diagnosis present

## 2013-10-14 DIAGNOSIS — I739 Peripheral vascular disease, unspecified: Secondary | ICD-10-CM | POA: Diagnosis present

## 2013-10-14 DIAGNOSIS — N184 Chronic kidney disease, stage 4 (severe): Secondary | ICD-10-CM | POA: Diagnosis present

## 2013-10-14 DIAGNOSIS — F329 Major depressive disorder, single episode, unspecified: Secondary | ICD-10-CM | POA: Diagnosis present

## 2013-10-14 DIAGNOSIS — Z888 Allergy status to other drugs, medicaments and biological substances status: Secondary | ICD-10-CM

## 2013-10-14 DIAGNOSIS — Z8249 Family history of ischemic heart disease and other diseases of the circulatory system: Secondary | ICD-10-CM

## 2013-10-14 DIAGNOSIS — Z801 Family history of malignant neoplasm of trachea, bronchus and lung: Secondary | ICD-10-CM

## 2013-10-14 DIAGNOSIS — Z6839 Body mass index (BMI) 39.0-39.9, adult: Secondary | ICD-10-CM

## 2013-10-14 DIAGNOSIS — I129 Hypertensive chronic kidney disease with stage 1 through stage 4 chronic kidney disease, or unspecified chronic kidney disease: Secondary | ICD-10-CM | POA: Diagnosis present

## 2013-10-14 DIAGNOSIS — IMO0002 Reserved for concepts with insufficient information to code with codable children: Secondary | ICD-10-CM

## 2013-10-14 DIAGNOSIS — E1129 Type 2 diabetes mellitus with other diabetic kidney complication: Secondary | ICD-10-CM | POA: Diagnosis present

## 2013-10-14 DIAGNOSIS — G309 Alzheimer's disease, unspecified: Secondary | ICD-10-CM | POA: Diagnosis present

## 2013-10-14 DIAGNOSIS — J189 Pneumonia, unspecified organism: Principal | ICD-10-CM | POA: Diagnosis present

## 2013-10-14 DIAGNOSIS — F0280 Dementia in other diseases classified elsewhere without behavioral disturbance: Secondary | ICD-10-CM | POA: Diagnosis present

## 2013-10-14 DIAGNOSIS — I25810 Atherosclerosis of coronary artery bypass graft(s) without angina pectoris: Secondary | ICD-10-CM

## 2013-10-14 DIAGNOSIS — F3289 Other specified depressive episodes: Secondary | ICD-10-CM | POA: Diagnosis present

## 2013-10-14 DIAGNOSIS — IMO0001 Reserved for inherently not codable concepts without codable children: Secondary | ICD-10-CM

## 2013-10-14 DIAGNOSIS — Q2111 Secundum atrial septal defect: Secondary | ICD-10-CM

## 2013-10-14 DIAGNOSIS — I10 Essential (primary) hypertension: Secondary | ICD-10-CM | POA: Diagnosis present

## 2013-10-14 DIAGNOSIS — K5730 Diverticulosis of large intestine without perforation or abscess without bleeding: Secondary | ICD-10-CM | POA: Diagnosis present

## 2013-10-14 DIAGNOSIS — Z833 Family history of diabetes mellitus: Secondary | ICD-10-CM

## 2013-10-14 DIAGNOSIS — I480 Paroxysmal atrial fibrillation: Secondary | ICD-10-CM | POA: Diagnosis present

## 2013-10-14 DIAGNOSIS — B370 Candidal stomatitis: Secondary | ICD-10-CM | POA: Diagnosis not present

## 2013-10-14 DIAGNOSIS — G8929 Other chronic pain: Secondary | ICD-10-CM | POA: Diagnosis present

## 2013-10-14 DIAGNOSIS — Q211 Atrial septal defect: Secondary | ICD-10-CM

## 2013-10-14 DIAGNOSIS — Z9861 Coronary angioplasty status: Secondary | ICD-10-CM

## 2013-10-14 DIAGNOSIS — E1139 Type 2 diabetes mellitus with other diabetic ophthalmic complication: Secondary | ICD-10-CM | POA: Diagnosis present

## 2013-10-14 DIAGNOSIS — Z882 Allergy status to sulfonamides status: Secondary | ICD-10-CM

## 2013-10-14 DIAGNOSIS — I519 Heart disease, unspecified: Secondary | ICD-10-CM | POA: Diagnosis present

## 2013-10-14 DIAGNOSIS — I5033 Acute on chronic diastolic (congestive) heart failure: Secondary | ICD-10-CM

## 2013-10-14 DIAGNOSIS — J45901 Unspecified asthma with (acute) exacerbation: Secondary | ICD-10-CM

## 2013-10-14 DIAGNOSIS — E11319 Type 2 diabetes mellitus with unspecified diabetic retinopathy without macular edema: Secondary | ICD-10-CM | POA: Diagnosis present

## 2013-10-14 DIAGNOSIS — J441 Chronic obstructive pulmonary disease with (acute) exacerbation: Secondary | ICD-10-CM | POA: Diagnosis present

## 2013-10-14 DIAGNOSIS — I4891 Unspecified atrial fibrillation: Secondary | ICD-10-CM | POA: Diagnosis present

## 2013-10-14 DIAGNOSIS — Z8261 Family history of arthritis: Secondary | ICD-10-CM

## 2013-10-14 DIAGNOSIS — E119 Type 2 diabetes mellitus without complications: Secondary | ICD-10-CM | POA: Diagnosis present

## 2013-10-14 DIAGNOSIS — E1165 Type 2 diabetes mellitus with hyperglycemia: Secondary | ICD-10-CM | POA: Diagnosis present

## 2013-10-14 DIAGNOSIS — E669 Obesity, unspecified: Secondary | ICD-10-CM | POA: Diagnosis present

## 2013-10-14 DIAGNOSIS — M109 Gout, unspecified: Secondary | ICD-10-CM | POA: Diagnosis present

## 2013-10-14 DIAGNOSIS — E785 Hyperlipidemia, unspecified: Secondary | ICD-10-CM | POA: Diagnosis present

## 2013-10-14 DIAGNOSIS — N189 Chronic kidney disease, unspecified: Secondary | ICD-10-CM

## 2013-10-14 HISTORY — DX: Unspecified chronic bronchitis: J42

## 2013-10-14 HISTORY — DX: Obstructive sleep apnea (adult) (pediatric): G47.33

## 2013-10-14 HISTORY — DX: Pure hypercholesterolemia, unspecified: E78.00

## 2013-10-14 HISTORY — DX: Dependence on other enabling machines and devices: Z99.89

## 2013-10-14 LAB — URINALYSIS, ROUTINE W REFLEX MICROSCOPIC
Bilirubin Urine: NEGATIVE
Glucose, UA: NEGATIVE mg/dL
Hgb urine dipstick: NEGATIVE
Ketones, ur: NEGATIVE mg/dL
LEUKOCYTES UA: NEGATIVE
NITRITE: NEGATIVE
PH: 6 (ref 5.0–8.0)
Protein, ur: 100 mg/dL — AB
SPECIFIC GRAVITY, URINE: 1.016 (ref 1.005–1.030)
Urobilinogen, UA: 0.2 mg/dL (ref 0.0–1.0)

## 2013-10-14 LAB — HEPATIC FUNCTION PANEL
ALK PHOS: 79 U/L (ref 39–117)
ALT: 16 U/L (ref 0–53)
AST: 25 U/L (ref 0–37)
Albumin: 3.5 g/dL (ref 3.5–5.2)
BILIRUBIN TOTAL: 0.4 mg/dL (ref 0.3–1.2)
TOTAL PROTEIN: 7.3 g/dL (ref 6.0–8.3)

## 2013-10-14 LAB — BASIC METABOLIC PANEL
BUN: 52 mg/dL — ABNORMAL HIGH (ref 6–23)
CHLORIDE: 98 meq/L (ref 96–112)
CO2: 27 mEq/L (ref 19–32)
Calcium: 9.3 mg/dL (ref 8.4–10.5)
Creatinine, Ser: 2.13 mg/dL — ABNORMAL HIGH (ref 0.50–1.35)
GFR calc Af Amer: 32 mL/min — ABNORMAL LOW (ref >90)
GFR calc non Af Amer: 28 mL/min — ABNORMAL LOW (ref >90)
GLUCOSE: 88 mg/dL (ref 70–99)
POTASSIUM: 4.6 meq/L (ref 3.7–5.3)
Sodium: 141 mEq/L (ref 137–147)

## 2013-10-14 LAB — CBC
HEMATOCRIT: 33.8 % — AB (ref 39.0–52.0)
HEMOGLOBIN: 10.5 g/dL — AB (ref 13.0–17.0)
MCH: 28.7 pg (ref 26.0–34.0)
MCHC: 31.1 g/dL (ref 30.0–36.0)
MCV: 92.3 fL (ref 78.0–100.0)
PLATELETS: 267 10*3/uL (ref 150–400)
RBC: 3.66 MIL/uL — ABNORMAL LOW (ref 4.22–5.81)
RDW: 17.9 % — ABNORMAL HIGH (ref 11.5–15.5)
WBC: 14.4 10*3/uL — ABNORMAL HIGH (ref 4.0–10.5)

## 2013-10-14 LAB — I-STAT TROPONIN, ED: Troponin i, poc: 0.05 ng/mL (ref 0.00–0.08)

## 2013-10-14 LAB — I-STAT ARTERIAL BLOOD GAS, ED
Acid-Base Excess: 5 mmol/L — ABNORMAL HIGH (ref 0.0–2.0)
BICARBONATE: 29.3 meq/L — AB (ref 20.0–24.0)
O2 Saturation: 90 %
PH ART: 7.443 (ref 7.350–7.450)
Patient temperature: 101.1
TCO2: 31 mmol/L (ref 0–100)
pCO2 arterial: 43.4 mmHg (ref 35.0–45.0)
pO2, Arterial: 61 mmHg — ABNORMAL LOW (ref 80.0–100.0)

## 2013-10-14 LAB — DIFFERENTIAL
Basophils Absolute: 0 10*3/uL (ref 0.0–0.1)
Basophils Relative: 0 % (ref 0–1)
EOS ABS: 0.3 10*3/uL (ref 0.0–0.7)
EOS PCT: 2 % (ref 0–5)
LYMPHS ABS: 1.4 10*3/uL (ref 0.7–4.0)
Lymphocytes Relative: 9 % — ABNORMAL LOW (ref 12–46)
Monocytes Absolute: 1.5 10*3/uL — ABNORMAL HIGH (ref 0.1–1.0)
Monocytes Relative: 10 % (ref 3–12)
NEUTROS PCT: 79 % — AB (ref 43–77)
Neutro Abs: 12.1 10*3/uL — ABNORMAL HIGH (ref 1.7–7.7)

## 2013-10-14 LAB — GLUCOSE, CAPILLARY: Glucose-Capillary: 194 mg/dL — ABNORMAL HIGH (ref 70–99)

## 2013-10-14 LAB — INFLUENZA PANEL BY PCR (TYPE A & B)
H1N1FLUPCR: NOT DETECTED
INFLAPCR: NEGATIVE
Influenza B By PCR: NEGATIVE

## 2013-10-14 LAB — PRO B NATRIURETIC PEPTIDE: Pro B Natriuretic peptide (BNP): 895.7 pg/mL — ABNORMAL HIGH (ref 0–450)

## 2013-10-14 LAB — TSH: TSH: 3.1 u[IU]/mL (ref 0.350–4.500)

## 2013-10-14 LAB — URINE MICROSCOPIC-ADD ON

## 2013-10-14 LAB — CBG MONITORING, ED: Glucose-Capillary: 98 mg/dL (ref 70–99)

## 2013-10-14 LAB — TROPONIN I: Troponin I: <0.3 ng/mL (ref <0.30)

## 2013-10-14 LAB — MAGNESIUM: Magnesium: 2.4 mg/dL (ref 1.5–2.5)

## 2013-10-14 MED ORDER — ASPIRIN EC 325 MG PO TBEC
325.0000 mg | DELAYED_RELEASE_TABLET | Freq: Every day | ORAL | Status: DC
  Administered 2013-10-14 – 2013-10-19 (×6): 325 mg via ORAL
  Filled 2013-10-14 (×6): qty 1

## 2013-10-14 MED ORDER — METHYLPREDNISOLONE SODIUM SUCC 125 MG IJ SOLR
80.0000 mg | Freq: Two times a day (BID) | INTRAMUSCULAR | Status: DC
  Administered 2013-10-15: 80 mg via INTRAVENOUS
  Filled 2013-10-14 (×3): qty 1.28

## 2013-10-14 MED ORDER — ISOSORBIDE MONONITRATE ER 60 MG PO TB24: 60.0000 mg | ORAL_TABLET | Freq: Every day | ORAL | Status: DC

## 2013-10-14 MED ORDER — NITROGLYCERIN 0.4 MG SL SUBL
0.4000 mg | SUBLINGUAL_TABLET | SUBLINGUAL | Status: DC | PRN
  Administered 2013-10-15 (×2): 0.4 mg via SUBLINGUAL
  Filled 2013-10-14: qty 1

## 2013-10-14 MED ORDER — PANTOPRAZOLE SODIUM 40 MG IV SOLR
40.0000 mg | INTRAVENOUS | Status: DC
  Administered 2013-10-14 – 2013-10-18 (×5): 40 mg via INTRAVENOUS
  Filled 2013-10-14 (×6): qty 40

## 2013-10-14 MED ORDER — PANTOPRAZOLE SODIUM 40 MG PO TBEC: 40.0000 mg | DELAYED_RELEASE_TABLET | Freq: Every day | ORAL | Status: DC

## 2013-10-14 MED ORDER — TIOTROPIUM BROMIDE MONOHYDRATE 18 MCG IN CAPS
18.0000 ug | ORAL_CAPSULE | Freq: Every day | RESPIRATORY_TRACT | Status: DC
  Administered 2013-10-14 – 2013-10-18 (×5): 18 ug via RESPIRATORY_TRACT
  Filled 2013-10-14 (×2): qty 5

## 2013-10-14 MED ORDER — INSULIN GLARGINE 100 UNIT/ML ~~LOC~~ SOLN
30.0000 [IU] | Freq: Two times a day (BID) | SUBCUTANEOUS | Status: DC
  Administered 2013-10-15 – 2013-10-17 (×4): 30 [IU] via SUBCUTANEOUS
  Filled 2013-10-14 (×7): qty 0.3

## 2013-10-14 MED ORDER — METHYLPREDNISOLONE SODIUM SUCC 125 MG IJ SOLR
125.0000 mg | Freq: Once | INTRAMUSCULAR | Status: AC
  Administered 2013-10-14: 125 mg via INTRAVENOUS
  Filled 2013-10-14: qty 2

## 2013-10-14 MED ORDER — IPRATROPIUM-ALBUTEROL 0.5-2.5 (3) MG/3ML IN SOLN
3.0000 mL | Freq: Once | RESPIRATORY_TRACT | Status: AC
  Administered 2013-10-14: 3 mL via RESPIRATORY_TRACT
  Filled 2013-10-14: qty 3

## 2013-10-14 MED ORDER — SODIUM CHLORIDE 0.9 % IV SOLN: INTRAVENOUS | Status: DC

## 2013-10-14 MED ORDER — SODIUM CHLORIDE 0.9 % IV SOLN
3.0000 g | Freq: Once | INTRAVENOUS | Status: AC
  Administered 2013-10-14: 3 g via INTRAVENOUS
  Filled 2013-10-14: qty 3

## 2013-10-14 MED ORDER — INSULIN ASPART 100 UNIT/ML ~~LOC~~ SOLN: 0.0000 [IU] | Freq: Three times a day (TID) | SUBCUTANEOUS | Status: DC

## 2013-10-14 MED ORDER — SODIUM CHLORIDE 0.9 % IV BOLUS (SEPSIS)
500.0000 mL | Freq: Once | INTRAVENOUS | Status: AC
  Administered 2013-10-14: 500 mL via INTRAVENOUS

## 2013-10-14 MED ORDER — SODIUM CHLORIDE 0.9 % IV SOLN
3.0000 g | Freq: Three times a day (TID) | INTRAVENOUS | Status: DC
  Administered 2013-10-15 – 2013-10-18 (×11): 3 g via INTRAVENOUS
  Filled 2013-10-14 (×12): qty 3

## 2013-10-14 MED ORDER — INSULIN GLARGINE 100 UNIT/ML ~~LOC~~ SOLN
62.0000 [IU] | Freq: Two times a day (BID) | SUBCUTANEOUS | Status: DC
  Filled 2013-10-14 (×3): qty 0.62

## 2013-10-14 MED ORDER — ACETAMINOPHEN 325 MG PO TABS
650.0000 mg | ORAL_TABLET | Freq: Once | ORAL | Status: AC
  Administered 2013-10-14: 650 mg via ORAL
  Filled 2013-10-14: qty 2

## 2013-10-14 MED ORDER — IPRATROPIUM-ALBUTEROL 0.5-2.5 (3) MG/3ML IN SOLN
3.0000 mL | RESPIRATORY_TRACT | Status: DC
  Administered 2013-10-14 – 2013-10-15 (×4): 3 mL via RESPIRATORY_TRACT
  Filled 2013-10-14 (×5): qty 3

## 2013-10-14 MED ORDER — ALBUTEROL SULFATE (2.5 MG/3ML) 0.083% IN NEBU: 3.0000 mL | INHALATION_SOLUTION | Freq: Every day | RESPIRATORY_TRACT | Status: DC | PRN

## 2013-10-14 MED ORDER — FUROSEMIDE 10 MG/ML IJ SOLN
40.0000 mg | Freq: Two times a day (BID) | INTRAMUSCULAR | Status: DC
  Administered 2013-10-14 – 2013-10-17 (×7): 40 mg via INTRAVENOUS
  Filled 2013-10-14 (×10): qty 4

## 2013-10-14 MED ORDER — SODIUM CHLORIDE 0.9 % IJ SOLN
3.0000 mL | INTRAMUSCULAR | Status: DC | PRN
  Administered 2013-10-14: 3 mL via INTRAVENOUS

## 2013-10-14 MED ORDER — SODIUM CHLORIDE 0.9 % IJ SOLN
3.0000 mL | Freq: Two times a day (BID) | INTRAMUSCULAR | Status: DC
  Administered 2013-10-14 – 2013-10-19 (×9): 3 mL via INTRAVENOUS

## 2013-10-14 MED ORDER — INSULIN GLARGINE 100 UNIT/ML ~~LOC~~ SOLN
15.0000 [IU] | Freq: Every day | SUBCUTANEOUS | Status: DC
  Filled 2013-10-14: qty 0.15

## 2013-10-14 MED ORDER — DEXTROSE 5 % IV SOLN
500.0000 mg | INTRAVENOUS | Status: DC
  Administered 2013-10-14 – 2013-10-16 (×3): 500 mg via INTRAVENOUS
  Filled 2013-10-14 (×4): qty 500

## 2013-10-14 MED ORDER — METHYLPREDNISOLONE SODIUM SUCC 125 MG IJ SOLR
80.0000 mg | Freq: Two times a day (BID) | INTRAMUSCULAR | Status: DC
  Filled 2013-10-14 (×2): qty 1.28

## 2013-10-14 MED ORDER — ALBUTEROL SULFATE (2.5 MG/3ML) 0.083% IN NEBU
2.5000 mg | INHALATION_SOLUTION | Freq: Four times a day (QID) | RESPIRATORY_TRACT | Status: DC
  Administered 2013-10-14: 2.5 mg via RESPIRATORY_TRACT
  Filled 2013-10-14: qty 3

## 2013-10-14 MED ORDER — PREDNISONE 5 MG PO TABS
5.0000 mg | ORAL_TABLET | Freq: Every day | ORAL | Status: DC
  Filled 2013-10-14: qty 1

## 2013-10-14 MED ORDER — SODIUM CHLORIDE 0.9 % IV SOLN: 250.0000 mL | INTRAVENOUS | Status: DC | PRN

## 2013-10-14 NOTE — H&P (Signed)
Date: 10/14/2013               Patient Name:  [name] MRN: [id number]  DOB: 14-Dec-1933 Age / Sex: 80 y.o., male   PCP: [name], MD         Medical Service: Internal Medicine Teaching Service         Attending Physician: Dr. Axel Filler, MD    First Contact: Dr. Redmond Pulling Pager: [phone]  Second Contact: Dr. 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   Patient states he has had worsening shortness of breath x 4 days.  He has also been experiencing cough productive of grey sputum during that time and fever since this morning.  He was seen by his PCP Dr. Arelia Sneddon on Thursday 5/21 who gave patient cough syrup and told him to call back if he felt worse.  Patient's wife called back Friday 5/22, and patient was prescribed Z-pak but wife did not have time to pick it up until this morning so patient had only taken one dose.  He came to ED today since he was still feeling poorly.  He uses 3L home O2 PRN (intermittently for past few days) as well as CPAP at night.  He denies coughing or choking when eating/drinking.  Denies chest pain but reports intermittent palpitations; denies leg swelling, orthopnea, PND.  Reports weakness but no syncope, also reports myalgias.   In ED, patient had O2 sat 82% on arrival, Tmax 101.1, leucocytosis to 14.  CXR showed right lung PNA thus patient started on Unasyn and duonebs.  ABG also showed 7.44/43.4/61/29.    PCP Dr. Arelia Sneddon, pulmonology Dr. Annamaria Boots, cardiologist  Dr. Burt Knack, nephrologist Dr. Baird Cancer  Meds: Current Facility-Administered Medications  Medication Dose Route Frequency Provider Last Rate Last Dose  . Ampicillin-Sulbactam (UNASYN) 3 g in sodium chloride 0.9 % 100 mL IVPB  3 g Intravenous Once Neta Ehlers, MD 100 mL/hr at 10/14/13 1847 3 g at 10/14/13 1847   Current Outpatient Prescriptions  Medication Sig Dispense Refill  . acetaminophen (TYLENOL) 650 MG CR tablet Take 1,300 mg by mouth 2 (two) times daily.      Marland Kitchen albuterol (PROAIR HFA) 108 (90 BASE) MCG/ACT inhaler Inhale 2 puffs into the lungs daily as needed for wheezing or shortness of breath.      Marland Kitchen albuterol (PROVENTIL) (2.5 MG/3ML) 0.083% nebulizer solution Take 2.5 mg by nebulization 4 (four) times daily.      Marland Kitchen allopurinol (ZYLOPRIM) 300 MG tablet Take 300 mg by mouth 2 (two) times daily.      Marland Kitchen amLODipine (NORVASC) 10 MG tablet Take 10 mg by mouth daily.      Marland Kitchen aspirin EC 325 MG tablet Take 1 tablet (325 mg total) by mouth daily.  30 tablet  0  . atorvastatin (LIPITOR) 20 MG tablet Take 20 mg by mouth daily.       . B-D UF III MINI PEN NEEDLES 31G X 5 MM MISC       .  cloNIDine HCl (KAPVAY) 0.1 MG TB12 ER tablet Take 1 tablet (0.1 mg total) by mouth 2 (two) times daily.  60 tablet  5  . colchicine 0.6 MG tablet Take 0.6 mg by mouth 2 (two) times daily.      Marland Kitchen escitalopram (LEXAPRO) 20 MG tablet Take 20 mg by mouth daily.      Marland Kitchen ezetimibe (ZETIA) 10 MG tablet Take 10 mg by mouth daily.       . folic acid (FOLVITE) A999333 MCG tablet Take 400 mcg by mouth daily.       . furosemide (LASIX) 80 MG tablet Take 1 tablet (80 mg total) by mouth 2 (two) times daily.  180 tablet  3  . gabapentin (NEURONTIN) 100 MG tablet Take 100 mg by mouth at bedtime.       . insulin aspart (NOVOLOG) 100 UNIT/ML injection Inject 20 Units into the skin 3 (three) times daily with meals.      . insulin glargine (LANTUS) 100 UNIT/ML injection Inject 64 Units into the skin 2 (two) times daily.       .  isosorbide mononitrate (IMDUR) 60 MG 24 hr tablet Take 60 mg by mouth daily.      . metolazone (ZAROXOLYN) 2.5 MG tablet Take 2.5 mg by mouth See admin instructions. Take 30 minutes prior to morning furosemide dose for a weight gain of 3 lbs or more      . Multiple Vitamin (MULTIVITAMIN WITH MINERALS) TABS Take 0.5 tablets by mouth 2 (two) times daily.      . ONE TOUCH ULTRA TEST test strip       . pantoprazole (PROTONIX) 40 MG tablet Take 40 mg by mouth daily.       . predniSONE (DELTASONE) 5 MG tablet Take 5 mg by mouth daily.      Marland Kitchen tiotropium (SPIRIVA) 18 MCG inhalation capsule Place 18 mcg into inhaler and inhale at bedtime.      . nitroGLYCERIN (NITROSTAT) 0.4 MG SL tablet Place 0.4 mg under the tongue every 5 (five) minutes as needed for chest pain. May repeat up to 3 times        Allergies: Allergies as of 10/14/2013 - Review Complete 10/14/2013  Allergen Reaction Noted  . Donepezil Diarrhea 05/02/2011  . Heparin Rash and Other (See Comments)   . Hydralazine Other (See Comments) 05/02/2011  . Hydromorphone Itching 05/02/2011  . Morphine Itching   . Plavix [clopidogrel bisulfate] Other (See Comments) 05/02/2011  . Promethazine Other (See Comments) 05/02/2011  . Sulfonamide derivatives Itching 05/22/2008  . Cefuroxime Other (See Comments)   . Metoprolol Other (See Comments)   . Ertapenem Itching and Rash 05/02/2011   Past Medical History  Diagnosis Date  . Adenomatous colon polyp   . Diverticulosis   . GERD (gastroesophageal reflux disease)   . Hiatal hernia   . Alzheimer disease   . Gout     "hands; backbone; elbows; shoulders"  . Asthma with bronchitis   . COPD (chronic obstructive pulmonary disease)   . Chronic hypoxemic respiratory failure   . OSA (obstructive sleep apnea)     CPAP  . CHF (congestive heart failure)     grade 2 diastolic dysfunction, EF [id number] on ECHO 12/2011  . Blood transfusion     "I've had about 6 pints" (12/31/2011)  . Pneumonia     intermittent   . Anemia   . GI bleed     GI [name],   . Hypertension   .  Peripheral vascular disease     PAD  . Dysrhythmia     "skips"  . Anginal pain   . Myocardial infarction     "they said I had 2 light ones"  . Shortness of breath 12/31/2011    "all the time"  . Type II diabetes mellitus     with complications:  retinopathy, nephrophathy.   . Arthritis     "plenty"  . Chronic lower back pain   . Renal insufficiency     "went on dialysis probably 3 times" Last 04/2011  . Skin cancer     "forehead; arms"  . Depression   . Diverticulosis   . Colon polyps     adenomatous and hyperplastic  . GERD (gastroesophageal reflux disease)   . Hiatal hernia   . Memory deficits 01/11/2013  . Obesity   . Carotid bruit     bilateral  . Gout   . CKD (chronic kidney disease) 03/07/2013  . Hemorrhoids   . AVM (arteriovenous malformation) of colon   . Gastric polyp   . Atrial fibrillation    Past Surgical History  Procedure Laterality Date  . Hand surgery Left 1990s    "chain saw accident; had to reattach fingers on left hand"  . Esophagogastroduodenoscopy  05/05/2011    Procedure: ESOPHAGOGASTRODUODENOSCOPY (EGD);  Surgeon: Inda Castle, MD;  Location: [facility];  Service: Endoscopy;  Laterality: N/A;  . Flexible sigmoidoscopy  05/05/2011    Procedure: FLEXIBLE SIGMOIDOSCOPY;  Surgeon: Inda Castle, MD;  Location: [facility];  Service: Endoscopy;  Laterality: N/A;  . Laparoscopic cholecystectomy  ~ 2001  . Excisional hemorrhoidectomy  1950's  . Cataract extraction w/ intraocular lens  implant, bilateral Bilateral   . Coronary artery bypass graft  1997    CABG X2 vessels  . Iliac artery stent  11/2011    left/H&P  . Renal artery stent  2010    left/H&P  . Iliac artery stent  11/2011    right  . Coronary angioplasty with stent placement  12/31/2011    "I have 15 stents; heart, kidney, aorta, legs"  . Colonoscopy w/ biopsies and polypectomy      "have had probably 5 cut out"  (12/31/2011)  . Enteroscopy  03/04/2012    Procedure: ENTEROSCOPY;  Surgeon: Inda Castle, MD;  Location: [facility];  Service: Endoscopy;  Laterality: N/A;  jessica/leone  . Esophagogastroduodenoscopy N/A 03/04/2013    Procedure: ESOPHAGOGASTRODUODENOSCOPY (EGD);  Surgeon: Inda Castle, MD;  Location: [facility];  Service: Endoscopy;  Laterality: N/A;  . Colonoscopy N/A 03/05/2013    Procedure: COLONOSCOPY;  Surgeon: Inda Castle, MD;  Location: [facility];  Service: Endoscopy;  Laterality: N/A;   Family History  Problem Relation Age of Onset  . Lung cancer Brother   . Diabetes Mother   . Arthritis Mother   . Heart attack Mother   . Heart disease Sister   . Stomach cancer Maternal Grandfather   . Heart disease Father   . Heart attack Father   . Heart disease Brother   . ALS Sister   . ALS      nephew/neice  . Colon cancer Neg Hx    History   Social History  . Marital Status: Married    Spouse Name: N/A    Number of Children: 5  . Years of Education: 8 th   Occupational History  . 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  Smoking status: Former Smoker -- 2.50 packs/day for 45 years    Types: Cigarettes    Quit date: 05/25/1990  . Smokeless tobacco: Former Systems developer    Types: Chew    Quit date: 06/01/1990  . Alcohol Use: No     Comment: 12/31/2011 "used to drink; last alcohol has been 30 years I imagine"  . Drug Use: No  . Sexual Activity: Not Currently   Other Topics Concern  . Not on file   Social History Narrative   Daily caffeine use.  Lives in a house with his wife.  Uses a cane to ambulate and he has a walker, but he does not use it.  He does not cook or pay bills.  He does not drive.    No smoking, EtOH, illicits.   Review of Systems: Review of Systems  Constitutional: Positive for diaphoresis. Negative for fever.  Eyes: Negative for blurred vision.  Respiratory: Positive for cough, sputum production, shortness of breath and  wheezing.   Cardiovascular: Positive for palpitations. Negative for chest pain, orthopnea, leg swelling and PND.  Gastrointestinal: Positive for nausea. Negative for vomiting, abdominal pain, diarrhea and constipation.  Genitourinary: Negative for dysuria.  Musculoskeletal: Positive for myalgias. Negative for falls.  Neurological: Positive for weakness and headaches. Negative for dizziness, focal weakness and loss of consciousness.    Physical Exam: Blood pressure 132/46, pulse 77, temperature 99.7 F (37.6 C), temperature source Oral, resp. rate 19, SpO2 92.00%. General: alert, cooperative, and in no apparent distress, no difficulty speaking in full sentences HEENT: NCAT vision grossly intact, oropharynx clear and non-erythematous  Neck: supple, no lymphadenopathy Lungs: mildly increased work of respiration, diffuse crackles, rhonchi in right mid-lung, minimal end-expiratory wheezing Heart: regular rate and rhythm, no murmurs, gallops, or rubs Abdomen: soft, non-tender, non-distended, normal bowel sounds Extremities: 2+ DP/PT pulses bilaterally, no cyanosis, clubbing, or edema Neurologic: alert & oriented X3, cranial nerves II-XII intact, strength grossly intact, sensation intact to light touch   Lab results: Basic Metabolic Panel:  Recent Labs  10/14/13 1625  NA 141  K 4.6  CL 98  CO2 27  GLUCOSE 88  BUN 52*  CREATININE 2.13*  CALCIUM 9.3   CBC:  Recent Labs  10/14/13 1625  WBC 14.4*  HGB 10.5*  HCT 33.8*  MCV 92.3  PLT 267   BNP:  Recent Labs  10/14/13 1625  PROBNP 895.7*   CBG:  Recent Labs  10/14/13 1622  GLUCAP 98   Urinalysis:  Recent Labs  10/14/13 1725  COLORURINE YELLOW  LABSPEC 1.016  PHURINE 6.0  GLUCOSEU NEGATIVE  HGBUR NEGATIVE  BILIRUBINUR NEGATIVE  KETONESUR NEGATIVE  PROTEINUR 100*  UROBILINOGEN 0.2  NITRITE NEGATIVE  LEUKOCYTESUR NEGATIVE    Imaging results:  Dg Chest Port 1 View  10/14/2013   CLINICAL DATA:   80 year old male with shortness of breath.  EXAM: PORTABLE CHEST - 1 VIEW  COMPARISON:  03/03/2013  FINDINGS: Cardiomegaly and cardiac surgical changes again noted.  Mid right lung airspace opacity likely represents pneumonia or aspiration.  Mild peribronchial thickening is unchanged.  Elevation of the right hemidiaphragm is again noted.  There is no evidence of pneumothorax, large pleural effusion or acute bony abnormality.  IMPRESSION: Mid right lung airspace opacity likely representing pneumonia or aspiration.  Cardiomegaly.   Electronically Signed   By: Hassan Rowan M.D.   On: 10/14/2013 17:27    Other results: EKG: sinus rhythm, normal axis and intervals, minimally increased ST depression in V3-V6 from prior  Assessment & Plan by Problem: #CAP vs. Aspiration PNA- Patient presented with increased shortness of breath x 4 days with cough productive of gray sputum.  Tmax to 101.1 in ED.  Leucocytosis to 14.4 but patient does not meet other SIRS criteria.  CXR showed mid-right lung airspace opacity, likely aspiration vs PNA.  Unlikely PE given Well's score of 0.  Patient uses 3L home O2 PRN; on 3L now and maintaining O2 sats well, mental status intact.    Flu panel negative, UA negative.   -admit to IMTS telemetry  -continue Unasyn in addition to azithromycin -sputum culture pending -Legionella, Strep pneumo urinary antigens pending -respiratory virus panel pending -NPO, SLP eval in AM   #COPD exacerbation- Patient presented with increased dyspnea, cough, and sputum production along with wheezing, consistent with COPD exacerbation.  On exam, patient with mildly increased work of respiration and minimal wheezing.Likely that these symptoms may be explained by pneumonia alone per above but will add steroids to treat possible concurrent COPD exacerbation.  PFTs in 2012 showed mild obstructive airway disease with moderately reduced diffusion capacity.  Patient uses albuterol neb QID, Spiriva qhs at home as  well as O2 prn.  Of note, patient takes prednisone 5 mg daily at home for gout. -Solumedrol IV 125 mg once now, 80 mg BID starting in AM  -duonebs q4h  -continue home Spiriva -RN to ambulate with pulse ox in AM   #CAD s/p CABG x 2- Patient denies chest pain.  Mild EKG changes per above, POC tropnonin in ED negative.   -cardiology consult -cycle troponins x 3 -continue ASA -repeat EKG in AM  #CHF- pBNP relatively stable at 895, 820 in 02/2013.  Little concern for acute exacerbation given diastolic dysfunction with no lower extremity swelling, no orthopnea or PND; furthermore, symptoms better explained by above.  Patient takes Lasix 80 mg BID, metolazone prn weight gain at home.  Of note, patient did received NS 1L bolus in ED.  -Lasix IV 40 mg BID -holding metolazone for now  #Uncontrolled DM2- A1C 9.9% in 08/2012, currently in 8s per wife.  Patient takes Lantus 65 units BID, Novolog 20 units TID AC.  Glucose on admission BMP 88.  -CBGs AC/hs -decrease Lantus to 30 units BID given NPO status -SSI-sensitive  #HTN- Currently normotensive.  Patient on amlodipine 10 mg daily, clonidine 0.1 mg BID at home.  -holding PO meds for now pending SLP eval -continue to monitor   #CKD2-3- Creatinine stable at 2.1, baseline ~1.9-2.1.  -BMP in AM   #History of atrial fibrillation- Currently in sinus rhythm.   -continue telemetry   #DVT PPX- SCDs for now, will give trial dose of lovenox in AM per pharmacy (patient has heparin allergy but believes he has taken lovenox without difficulty in past)  #Code status- Full code   Dispo: Disposition is deferred at this time, awaiting improvement of current medical problems. Anticipated discharge in approximately 2-3 day(s).   The patient does have a current PCP Redmond Pulling Arna Medici, MD) and does need an [facility] hospital follow-up appointment after discharge.   Signed: Ivin Poot, MD 10/14/2013, 7:06 PM

## 2013-10-14 NOTE — ED Provider Notes (Signed)
CSN: [id number]     Arrival date & time 10/14/13  1607 History   First MD Initiated Contact with Patient 10/14/13 1612     Chief Complaint  Patient presents with  . Shortness of Breath     (Consider location/radiation/quality/duration/timing/severity/associated sxs/prior Treatment) Patient is a 80 y.o. male presenting with shortness of breath. The history is provided by the patient. No language interpreter was used.  Shortness of Breath Severity:  Moderate Onset quality:  Gradual Duration:  3 days Timing:  Constant Progression:  Worsening Chronicity:  Recurrent Relieved by:  Nothing Worsened by:  Exertion and movement Ineffective treatments:  Rest Associated symptoms: abdominal pain, chest pain, cough, fever, headaches and sputum production   Associated symptoms: no rash and no vomiting   Associated symptoms comment:  Myalgias  Abdominal pain:    Location:  Generalized   Quality:  Aching   Severity:  Unable to specify   Timing:  Unable to specify Chest pain:    Quality:  Aching   Severity:  Unable to specify   Timing:  Intermittent   Progression:  Waxing and waning   Chronicity:  New Cough:    Cough characteristics:  Productive   Sputum characteristics:  Nondescript   Severity:  Moderate   Onset quality:  Gradual   Duration:  3 days   Timing:  Constant   Progression:  Worsening   Chronicity:  New Fever:    Duration:  1 day   Timing:  Unable to specify   Progression:  Unable to specify Headaches:    Severity:  Moderate   Onset quality:  Unable to specify   Timing:  Intermittent   Progression:  Waxing and waning   Chronicity:  New Risk factors: obesity     Past Medical History  Diagnosis Date  . Adenomatous colon polyp   . Diverticulosis   . GERD (gastroesophageal reflux disease)   . Hiatal hernia   . Alzheimer disease   . Gout     "hands; backbone; elbows; shoulders"  . Asthma with bronchitis   . COPD (chronic obstructive pulmonary disease)   .  Chronic hypoxemic respiratory failure   . OSA (obstructive sleep apnea)     CPAP  . CHF (congestive heart failure)     grade 2 diastolic dysfunction, EF 23-53% on ECHO 12/2011  . Blood transfusion     "I've had about 6 pints" (12/31/2011)  . Pneumonia     intermittent  . Anemia   . GI bleed     GI [name],   . Hypertension   . Peripheral vascular disease     PAD  . Dysrhythmia     "skips"  . Anginal pain   . Myocardial infarction     "they said I had 2 light ones"  . Shortness of breath 12/31/2011    "all the time"  . Type II diabetes mellitus     with complications:  retinopathy, nephrophathy.   . Arthritis     "plenty"  . Chronic lower back pain   . Renal insufficiency     "went on dialysis probably 3 times" Last 04/2011  . Skin cancer     "forehead; arms"  . Depression   . Diverticulosis   . Colon polyps     adenomatous and hyperplastic  . GERD (gastroesophageal reflux disease)   . Hiatal hernia   . Memory deficits 01/11/2013  . Obesity   . Carotid bruit     bilateral  . Gout   .  CKD (chronic kidney disease) 03/07/2013  . Hemorrhoids   . AVM (arteriovenous malformation) of colon   . Gastric polyp   . Atrial fibrillation    Past Surgical History  Procedure Laterality Date  . Hand surgery Left 1990s    "chain saw accident; had to reattach fingers on left hand"  . Esophagogastroduodenoscopy  05/05/2011    Procedure: ESOPHAGOGASTRODUODENOSCOPY (EGD);  Surgeon: Inda Castle, MD;  Location: [facility];  Service: Endoscopy;  Laterality: N/A;  . Flexible sigmoidoscopy  05/05/2011    Procedure: FLEXIBLE SIGMOIDOSCOPY;  Surgeon: Inda Castle, MD;  Location: [facility];  Service: Endoscopy;  Laterality: N/A;  . Laparoscopic cholecystectomy  ~ 2001  . Excisional hemorrhoidectomy  1950's  . Cataract extraction w/ intraocular lens  implant, bilateral Bilateral   . Coronary artery bypass graft  1997    CABG X2 vessels  . Iliac artery stent  11/2011    left/H&P   . Renal artery stent  2010    left/H&P  . Iliac artery stent  11/2011    right  . Coronary angioplasty with stent placement  12/31/2011    "I have 15 stents; heart, kidney, aorta, legs"  . Colonoscopy w/ biopsies and polypectomy      "have had probably 5 cut out" (12/31/2011)  . Enteroscopy  03/04/2012    Procedure: ENTEROSCOPY;  Surgeon: Inda Castle, MD;  Location: [facility];  Service: Endoscopy;  Laterality: N/A;  jessica/leone  . Esophagogastroduodenoscopy N/A 03/04/2013    Procedure: ESOPHAGOGASTRODUODENOSCOPY (EGD);  Surgeon: Inda Castle, MD;  Location: [facility];  Service: Endoscopy;  Laterality: N/A;  . Colonoscopy N/A 03/05/2013    Procedure: COLONOSCOPY;  Surgeon: Inda Castle, MD;  Location: [facility];  Service: Endoscopy;  Laterality: N/A;   Family History  Problem Relation Age of Onset  . Lung cancer Brother   . Diabetes Mother   . Arthritis Mother   . Heart attack Mother   . Heart disease Sister   . Stomach cancer Maternal Grandfather   . Heart disease Father   . Heart attack Father   . Heart disease Brother   . ALS Sister   . ALS      nephew/neice  . Colon cancer Neg Hx    History  Substance Use Topics  . Smoking status: Former Smoker -- 2.50 packs/day for 45 years    Types: Cigarettes    Quit date: 05/25/1990  . Smokeless tobacco: Former Systems developer    Types: Chew    Quit date: 06/01/1990  . Alcohol Use: No     Comment: 12/31/2011 "used to drink; last alcohol has been 30 years I imagine"    Review of Systems  Constitutional: Positive for fever. Negative for activity change, appetite change and fatigue.  HENT: Negative for congestion, facial swelling, rhinorrhea and trouble swallowing.   Eyes: Negative for photophobia and pain.  Respiratory: Positive for cough, sputum production and shortness of breath. Negative for chest tightness.   Cardiovascular: Positive for chest pain. Negative for leg swelling.  Gastrointestinal: Positive for abdominal  pain. Negative for nausea, vomiting, diarrhea and constipation.  Endocrine: Negative for polydipsia and polyuria.  Genitourinary: Negative for dysuria, urgency, decreased urine volume and difficulty urinating.  Musculoskeletal: Negative for back pain and gait problem.  Skin: Negative for color change, rash and wound.  Allergic/Immunologic: Negative for immunocompromised state.  Neurological: Positive for headaches. Negative for dizziness, facial asymmetry, speech difficulty, weakness and numbness.  Psychiatric/Behavioral: Negative for confusion, decreased concentration and  agitation.      Allergies  Donepezil; Heparin; Hydralazine; Hydromorphone; Morphine; Plavix; Promethazine; Sulfonamide derivatives; Cefuroxime; Metoprolol; and Ertapenem  Home Medications   Prior to Admission medications   Medication Sig Start Date End Date Taking? Authorizing Provider  acetaminophen (TYLENOL) 650 MG CR tablet Take 1,300 mg by mouth 2 (two) times daily.    Historical Provider, MD  albuterol (PROAIR HFA) 108 (90 BASE) MCG/ACT inhaler Inhale 2 puffs into the lungs daily as needed for wheezing or shortness of breath.    Historical Provider, MD  albuterol (PROVENTIL) (2.5 MG/3ML) 0.083% nebulizer solution Take 2.5 mg by nebulization 4 (four) times daily.    Historical Provider, MD  allopurinol (ZYLOPRIM) 300 MG tablet Take 300 mg by mouth 2 (two) times daily.    Historical Provider, MD  amLODipine (NORVASC) 10 MG tablet Take 10 mg by mouth daily.    Historical Provider, MD  aspirin EC 325 MG tablet Take 1 tablet (325 mg total) by mouth daily. 03/19/13   Bobby Rumpf York, PA-C  atorvastatin (LIPITOR) 20 MG tablet Take 20 mg by mouth daily.     Historical Provider, MD  B-D UF III MINI PEN NEEDLES 31G X 5 MM MISC  03/14/13   Historical Provider, MD  cloNIDine HCl (KAPVAY) 0.1 MG TB12 ER tablet Take 1 tablet (0.1 mg total) by mouth 2 (two) times daily. 12/11/11   Tarri Fuller, PA-C  colchicine 0.6 MG tablet Take  0.6 mg by mouth 2 (two) times daily.    Historical Provider, MD  escitalopram (LEXAPRO) 20 MG tablet Take 20 mg by mouth daily.    Historical Provider, MD  ezetimibe (ZETIA) 10 MG tablet Take 10 mg by mouth daily.     Historical Provider, MD  folic acid (FOLVITE) A999333 MCG tablet Take 400 mcg by mouth daily.     Historical Provider, MD  furosemide (LASIX) 80 MG tablet Take 1 tablet (80 mg total) by mouth 2 (two) times daily. 06/29/12   Carole Civil, MD  gabapentin (NEURONTIN) 100 MG tablet Take 100 mg by mouth at bedtime.     Historical Provider, MD  insulin aspart (NOVOLOG) 100 UNIT/ML injection Inject 20 Units into the skin 3 (three) times daily with meals.    Historical Provider, MD  insulin glargine (LANTUS) 100 UNIT/ML injection Inject 59 Units into the skin 2 (two) times daily.     Historical Provider, MD  isosorbide mononitrate (IMDUR) 30 MG 24 hr tablet Take 60 mg by mouth daily.  03/09/11   Historical Provider, MD  metolazone (ZAROXOLYN) 2.5 MG tablet Take 2.5 mg by mouth See admin instructions. Take 30 minutes prior to morning furosemide dose for a weight gain of 3 lbs or more    Historical Provider, MD  Multiple Vitamin (MULTIVITAMIN WITH MINERALS) TABS Take 0.5 tablets by mouth 2 (two) times daily.    Historical Provider, MD  nitroGLYCERIN (NITROSTAT) 0.4 MG SL tablet Place 0.4 mg under the tongue every 5 (five) minutes as needed for chest pain. May repeat up to 3 times    Historical Provider, MD  ONE TOUCH ULTRA TEST test strip  03/22/13   Historical Provider, MD  pantoprazole (PROTONIX) 40 MG tablet Take 40 mg by mouth daily.     Historical Provider, MD  predniSONE (DELTASONE) 5 MG tablet Take 5 mg by mouth daily. 11/07/12   Historical Provider, MD  tiotropium (SPIRIVA) 18 MCG inhalation capsule Place 18 mcg into inhaler and inhale at bedtime.    Historical  Provider, MD   BP 107/38  Pulse 77  Temp(Src) 99.7 F (37.6 C) (Oral)  Resp 18  SpO2 92% Physical Exam  Constitutional: He  is oriented to person, place, and time. He appears well-developed and well-nourished. No distress.  HENT:  Head: Normocephalic and atraumatic.  Mouth/Throat: No oropharyngeal exudate.  Eyes: Pupils are equal, round, and reactive to light.  Neck: Normal range of motion. Neck supple.  Cardiovascular: Normal rate, regular rhythm and normal heart sounds.  Exam reveals no gallop and no friction rub.   No murmur heard. Pulmonary/Chest: No accessory muscle usage. Tachypnea noted. No respiratory distress. He has wheezes in the right upper field, the right middle field, the left upper field, the left middle field and the left lower field. He has rhonchi in the right upper field, the right middle field, the left upper field and the left middle field. He has rales in the right middle field and the left lower field.  Pursed lip breathing  Abdominal: Soft. Bowel sounds are normal. He exhibits no distension and no mass. There is no tenderness. There is no rebound and no guarding.  Musculoskeletal: Normal range of motion. He exhibits no edema and no tenderness.  Neurological: He is alert and oriented to person, place, and time.  Skin: Skin is warm and dry.  Psychiatric: He has a normal mood and affect.    ED Course  Procedures (including critical care time) Labs Review Labs Reviewed  PRO B NATRIURETIC PEPTIDE - Abnormal; Notable for the following:    Pro B Natriuretic peptide (BNP) 895.7 (*)    All other components within normal limits  BASIC METABOLIC PANEL - Abnormal; Notable for the following:    BUN 52 (*)    Creatinine, Ser 2.13 (*)    GFR calc non Af Amer 28 (*)    GFR calc Af Amer 32 (*)    All other components within normal limits  CBC - Abnormal; Notable for the following:    WBC 14.4 (*)    RBC 3.66 (*)    Hemoglobin 10.5 (*)    HCT 33.8 (*)    RDW 17.9 (*)    All other components within normal limits  I-STAT ARTERIAL BLOOD GAS, ED - Abnormal; Notable for the following:    pO2,  Arterial 61.0 (*)    Bicarbonate 29.3 (*)    Acid-Base Excess 5.0 (*)    All other components within normal limits  CULTURE, BLOOD (ROUTINE X 2)  CULTURE, BLOOD (ROUTINE X 2)  URINE CULTURE  BLOOD GAS, ARTERIAL  URINALYSIS, ROUTINE W REFLEX MICROSCOPIC  INFLUENZA PANEL BY PCR (TYPE A & B, H1N1)  URINE MICROSCOPIC-ADD ON  Randolm Idol, ED  CBG MONITORING, ED    Imaging Review Dg Chest Port 1 View  10/14/2013   CLINICAL DATA:  80 year old male with shortness of breath.  EXAM: PORTABLE CHEST - 1 VIEW  COMPARISON:  03/03/2013  FINDINGS: Cardiomegaly and cardiac surgical changes again noted.  Mid right lung airspace opacity likely represents pneumonia or aspiration.  Mild peribronchial thickening is unchanged.  Elevation of the right hemidiaphragm is again noted.  There is no evidence of pneumothorax, large pleural effusion or acute bony abnormality.  IMPRESSION: Mid right lung airspace opacity likely representing pneumonia or aspiration.  Cardiomegaly.   Electronically Signed   By: Hassan Rowan M.D.   On: 10/14/2013 17:27     EKG Interpretation   Date/Time:  Saturday Oct 14 2013 16:17:49 EDT Ventricular Rate:  79  PR Interval:  151 QRS Duration: 85 QT Interval:  378 QTC Calculation: 433 R Axis:   56 Text Interpretation:  Sinus rhythm Repol abnrm suggests ischemia, diffuse  leads ST changes seen on prior, but are now more prominent in V3-V6  Confirmed by DOCHERTY  MD, MEGAN [phone]) on 10/14/2013 4:25:34 PM      MDM   Final diagnoses:  Community acquired pneumonia    Pt is a 80 y.o. male with Pmhx as above who presents with about 3 days of inc SOB, cough, myalgias, now today with worsening SOB and intermittent delerium. Pt seen by PCP 3 days ago, given cough syrup, then was called in Rx for z-pack today, took first dose. On PE, pt hypoxic on RA in WR, febrile, tachypneic, he has scattered wheezing and crackles throughout. Minimal LE edema. He complaints of pain all over (chest  abdomen, back, head, legs).   CXR shows aspiration vs pna, With elevated WBC< inc SOB, fever, suspect pna. Unasyn given.  PORT score of 128 (risk class IV). IM teaching service consulted for admission. Wheezing improved w/ duoneb.      Neta Ehlers, MD 10/14/13 [phone]

## 2013-10-14 NOTE — ED Notes (Signed)
Daughter brought pt because she was concerned about his breathing today. He is labored at rest in triage, o2 sats are 82% on RA, he states he always on 3L [place] but he didn't bring it with him today. He states his whole body hurts now.

## 2013-10-14 NOTE — ED Notes (Signed)
Pt started Zpack today, was seen in office last Wednesday and was told if no better start ABX.

## 2013-10-14 NOTE — Progress Notes (Signed)
ANTIBIOTIC CONSULT NOTE - INITIAL  Pharmacy Consult for Unasyn Indication: PNA  Allergies  Allergen Reactions  . Donepezil Diarrhea  . Heparin Rash and Other (See Comments)    Family stated it almost killed him. Hallucinations and itching.  Marland Kitchen Hydralazine Other (See Comments)    DO NOT TAKE PER MD  . Hydromorphone Itching  . Morphine Itching  . Plavix [Clopidogrel Bisulfate] Other (See Comments)    GI BLEEDING; "so bad I had to take blood; dr took me off it"  . Promethazine Other (See Comments)    respiratory failure  . Sulfonamide Derivatives Itching  . Cefuroxime Other (See Comments)     unkown reaction  . Metoprolol Other (See Comments)    unknown  . Ertapenem Itching and Rash    12/31/2011 pt does not recall allergy or severity    Patient Measurements: Height: 5\' 6"  (167.6 cm) Weight: 241 lb 10 oz (109.6 kg) IBW/kg (Calculated) : 63.8 Adjusted Body Weight:   Vital Signs: Temp: 98.6 F (37 C) (05/23 1952) Temp src: Oral (05/23 1952) BP: 158/37 mmHg (05/23 1952) Pulse Rate: 67 (05/23 1952) Intake/Output from previous day:   Intake/Output from this shift:    Labs:  Recent Labs  10/14/13 1625  WBC 14.4*  HGB 10.5*  PLT 267  CREATININE 2.13*   Estimated Creatinine Clearance: 33.2 ml/min (by C-G formula based on Cr of 2.13). No results found for this basename: VANCOTROUGH, VANCOPEAK, VANCORANDOM, GENTTROUGH, GENTPEAK, GENTRANDOM, TOBRATROUGH, TOBRAPEAK, TOBRARND, AMIKACINPEAK, AMIKACINTROU, AMIKACIN,  in the last 72 hours   Microbiology: No results found for this or any previous visit (from the past 720 hour(s)).  Medical History: Past Medical History  Diagnosis Date  . Adenomatous colon polyp   . Diverticulosis   . GERD (gastroesophageal reflux disease)   . Hiatal hernia   . Alzheimer disease   . Gout     "hands; backbone; elbows; shoulders"  . Asthma with bronchitis   . COPD (chronic obstructive pulmonary disease)   . Chronic hypoxemic respiratory  failure   . OSA (obstructive sleep apnea)     CPAP  . CHF (congestive heart failure)     grade 2 diastolic dysfunction, EF 95-18% on ECHO 12/2011  . Blood transfusion     "I've had about 6 pints" (12/31/2011)  . Pneumonia     intermittent  . Anemia   . GI bleed     GI [name],   . Hypertension   . Peripheral vascular disease     PAD  . Dysrhythmia     "skips"  . Anginal pain   . Myocardial infarction     "they said I had 2 light ones"  . Shortness of breath 12/31/2011    "all the time"  . Type II diabetes mellitus     with complications:  retinopathy, nephrophathy.   . Arthritis     "plenty"  . Chronic lower back pain   . Renal insufficiency     "went on dialysis probably 3 times" Last 04/2011  . Skin cancer     "forehead; arms"  . Depression   . Diverticulosis   . Colon polyps     adenomatous and hyperplastic  . GERD (gastroesophageal reflux disease)   . Hiatal hernia   . Memory deficits 01/11/2013  . Obesity   . Carotid bruit     bilateral  . Gout   . CKD (chronic kidney disease) 03/07/2013  . Hemorrhoids   . AVM (arteriovenous malformation) of colon   .  Gastric polyp   . Atrial fibrillation     Medications:  Prescriptions prior to admission  Medication Sig Dispense Refill  . acetaminophen (TYLENOL) 650 MG CR tablet Take 1,300 mg by mouth 2 (two) times daily.      Marland Kitchen albuterol (PROAIR HFA) 108 (90 BASE) MCG/ACT inhaler Inhale 2 puffs into the lungs daily as needed for wheezing or shortness of breath.      Marland Kitchen albuterol (PROVENTIL) (2.5 MG/3ML) 0.083% nebulizer solution Take 2.5 mg by nebulization 4 (four) times daily.      Marland Kitchen allopurinol (ZYLOPRIM) 300 MG tablet Take 300 mg by mouth 2 (two) times daily.      Marland Kitchen amLODipine (NORVASC) 10 MG tablet Take 10 mg by mouth daily.      Marland Kitchen aspirin EC 325 MG tablet Take 1 tablet (325 mg total) by mouth daily.  30 tablet  0  . atorvastatin (LIPITOR) 20 MG tablet Take 20 mg by mouth daily.       . B-D UF III MINI PEN NEEDLES 31G  X 5 MM MISC       . cloNIDine HCl (KAPVAY) 0.1 MG TB12 ER tablet Take 1 tablet (0.1 mg total) by mouth 2 (two) times daily.  60 tablet  5  . colchicine 0.6 MG tablet Take 0.6 mg by mouth 2 (two) times daily.      Marland Kitchen escitalopram (LEXAPRO) 20 MG tablet Take 20 mg by mouth daily.      Marland Kitchen ezetimibe (ZETIA) 10 MG tablet Take 10 mg by mouth daily.       . folic acid (FOLVITE) 841 MCG tablet Take 400 mcg by mouth daily.       . furosemide (LASIX) 80 MG tablet Take 1 tablet (80 mg total) by mouth 2 (two) times daily.  180 tablet  3  . gabapentin (NEURONTIN) 100 MG tablet Take 100 mg by mouth at bedtime.       . insulin aspart (NOVOLOG) 100 UNIT/ML injection Inject 20 Units into the skin 3 (three) times daily with meals.      . insulin glargine (LANTUS) 100 UNIT/ML injection Inject 64 Units into the skin 2 (two) times daily.       . isosorbide mononitrate (IMDUR) 60 MG 24 hr tablet Take 60 mg by mouth daily.      . metolazone (ZAROXOLYN) 2.5 MG tablet Take 2.5 mg by mouth See admin instructions. Take 30 minutes prior to morning furosemide dose for a weight gain of 3 lbs or more      . Multiple Vitamin (MULTIVITAMIN WITH MINERALS) TABS Take 0.5 tablets by mouth 2 (two) times daily.      . ONE TOUCH ULTRA TEST test strip       . pantoprazole (PROTONIX) 40 MG tablet Take 40 mg by mouth daily.       . predniSONE (DELTASONE) 5 MG tablet Take 5 mg by mouth daily.      Marland Kitchen tiotropium (SPIRIVA) 18 MCG inhalation capsule Place 18 mcg into inhaler and inhale at bedtime.      . nitroGLYCERIN (NITROSTAT) 0.4 MG SL tablet Place 0.4 mg under the tongue every 5 (five) minutes as needed for chest pain. May repeat up to 3 times       Assessment: Labored breathing, O2 sats 82%, cough, malgias.  80 y/o M presents with SOB. Starting a Z-pack today from outpatient MD.CSR=aspiration vs PNA. -Tmax 101.1. WBC 14.2. Scr 2.13 (CrCl 33.2)(at baseline)  Goal of Therapy:  Resolution of  PNA  Plan:  Continue Azithromycin 500mg  IV  q24h Unasyn 3g x 1 in ED then 3g IV q8hr.   [place] Goh S. Alford Highland, PharmD, [facility] Clinical Staff Pharmacist Pager [phone]  Coralyn Pear Alford Highland 10/14/2013,8:44 PM

## 2013-10-14 NOTE — Consult Note (Signed)
Cardiology Consultation Note  Patient ID: [name], MRN: [id number], DOB/AGE: [birth year] 80 y.o. Admit date: 10/14/2013   Date of Consult: 10/14/2013 Primary Physician: [name], MD Primary Cardiologist: Sherren Mocha   Chief Complaint: SOB  Reason for Consult: Abnormal EKG with ST depression in pt hx of CAD    80 year old man with history of  Poorly controlled HTN, DM2 (A1C 9.9% in 08/2012), , CAD s/p CABG x 2 in 1997 with chronic stable angina , HFpEF  (EF 65-70% with grade 2 diastolic dysfunction in 10/2701), paroxysmal atrial fibrillation, CKD2-3, peripheral vascular disease with iliac intervention and chronic claudication,  OSA on CPAP who presents with shortness of breath and worked up to have pneumonia   HPI: pt staes that he has been have scant cough with productive green sputum and associated SOB. Denies any overt chest pain. Has chronic claudication. No orthopnea, PND , le edema , syncope, nasuea . Reports complicance with home medications , including his CPAP .  In the ED pt was noted to be hypoxic and a chest xray revealed right lung ASD .  Cardiology was consulted by admitting medicine team since the EKG appeared to have ST depression in V4-V6 .   Past Medical History  Diagnosis Date  . Adenomatous colon polyp   . Diverticulosis   . GERD (gastroesophageal reflux disease)   . Hiatal hernia   . Alzheimer disease   . Gout     "hands; backbone; elbows; shoulders"  . Asthma with bronchitis   . COPD (chronic obstructive pulmonary disease)   . Chronic hypoxemic respiratory failure   . OSA (obstructive sleep apnea)     CPAP  . CHF (congestive heart failure)     grade 2 diastolic dysfunction, EF 50-09% on ECHO 12/2011  . Blood transfusion     "I've had about 6 pints" (12/31/2011)  . Pneumonia     intermittent  . Anemia   . GI bleed     GI [name],   . Hypertension   . Peripheral vascular disease     PAD  . Dysrhythmia     "skips"  . Anginal pain   .  Myocardial infarction     "they said I had 2 light ones"  . Shortness of breath 12/31/2011    "all the time"  . Type II diabetes mellitus     with complications:  retinopathy, nephrophathy.   . Arthritis     "plenty"  . Chronic lower back pain   . Renal insufficiency     "went on dialysis probably 3 times" Last 04/2011  . Skin cancer     "forehead; arms"  . Depression   . Diverticulosis   . Colon polyps     adenomatous and hyperplastic  . GERD (gastroesophageal reflux disease)   . Hiatal hernia   . Memory deficits 01/11/2013  . Obesity   . Carotid bruit     bilateral  . Gout   . CKD (chronic kidney disease) 03/07/2013  . Hemorrhoids   . AVM (arteriovenous malformation) of colon   . Gastric polyp   . Atrial fibrillation       Most Recent Cardiac Studies:  Echo 12/2011  Left ventricle: The cavity size was normal. Wall thickness was normal. Systolic function was vigorous. The estimated ejection fraction was in the range of 65% to 70%. Wall motion was normal; there were no regional wall motion abnormalities. Features are consistent with a pseudonormal left ventricular filling pattern,  with concomitant abnormal relaxation and increased filling pressure (grade 2 diastolic dysfunction). Doppler parameters are consistent with elevated mean left atrial filling pressure. - Ventricular septum: Septal motion showed paradox. - Mitral valve: Calcified annulus. Mildly thickened leaflets . - Left atrium: The atrium was mildly dilated.  Lexi Nuclear Stress test 09/2012 Overall Impression: Normal stress nuclear study. RV appears dilated  LV Ejection Fraction: 65%. LV Wall Motion: NL LV Function; NL Wall Motion  [place]      Surgical History:  Past Surgical History  Procedure Laterality Date  . Hand surgery Left 1990s    "chain saw accident; had to reattach fingers on left hand"  . Esophagogastroduodenoscopy  05/05/2011    Procedure: ESOPHAGOGASTRODUODENOSCOPY (EGD);   Surgeon: Inda Castle, MD;  Location: [facility];  Service: Endoscopy;  Laterality: N/A;  . Flexible sigmoidoscopy  05/05/2011    Procedure: FLEXIBLE SIGMOIDOSCOPY;  Surgeon: Inda Castle, MD;  Location: [facility];  Service: Endoscopy;  Laterality: N/A;  . Laparoscopic cholecystectomy  ~ 2001  . Excisional hemorrhoidectomy  1950's  . Cataract extraction w/ intraocular lens  implant, bilateral Bilateral   . Coronary artery bypass graft  1997    CABG X2 vessels  . Iliac artery stent  11/2011    left/H&P  . Renal artery stent  2010    left/H&P  . Iliac artery stent  11/2011    right  . Coronary angioplasty with stent placement  12/31/2011    "I have 15 stents; heart, kidney, aorta, legs"  . Colonoscopy w/ biopsies and polypectomy      "have had probably 5 cut out" (12/31/2011)  . Enteroscopy  03/04/2012    Procedure: ENTEROSCOPY;  Surgeon: Inda Castle, MD;  Location: [place];  Service: Endoscopy;  Laterality: N/A;  jessica/leone  . Esophagogastroduodenoscopy N/A 03/04/2013    Procedure: ESOPHAGOGASTRODUODENOSCOPY (EGD);  Surgeon: Inda Castle, MD;  Location: [place];  Service: Endoscopy;  Laterality: N/A;  . Colonoscopy N/A 03/05/2013    Procedure: COLONOSCOPY;  Surgeon: Inda Castle, MD;  Location: [place];  Service: Endoscopy;  Laterality: N/A;     Home Meds: Prior to Admission medications   Medication Sig Start Date End Date Taking? Authorizing Provider  acetaminophen (TYLENOL) 650 MG CR tablet Take 1,300 mg by mouth 2 (two) times daily.   Yes Historical Provider, MD  albuterol (PROAIR HFA) 108 (90 BASE) MCG/ACT inhaler Inhale 2 puffs into the lungs daily as needed for wheezing or shortness of breath.   Yes Historical Provider, MD  albuterol (PROVENTIL) (2.5 MG/3ML) 0.083% nebulizer solution Take 2.5 mg by nebulization 4 (four) times daily.   Yes Historical Provider, MD  allopurinol (ZYLOPRIM) 300 MG tablet Take 300 mg by mouth 2 (two) times daily.   Yes  Historical Provider, MD  amLODipine (NORVASC) 10 MG tablet Take 10 mg by mouth daily.   Yes Historical Provider, MD  aspirin EC 325 MG tablet Take 1 tablet (325 mg total) by mouth daily. 03/19/13  Yes Marianne L York, PA-C  atorvastatin (LIPITOR) 20 MG tablet Take 20 mg by mouth daily.    Yes Historical Provider, MD  B-D UF III MINI PEN NEEDLES 31G X 5 MM MISC  03/14/13  Yes Historical Provider, MD  cloNIDine HCl (KAPVAY) 0.1 MG TB12 ER tablet Take 1 tablet (0.1 mg total) by mouth 2 (two) times daily. 12/11/11  Yes Tarri Fuller, PA-C  colchicine 0.6 MG tablet Take 0.6 mg by mouth 2 (two) times daily.  Yes Historical Provider, MD  escitalopram (LEXAPRO) 20 MG tablet Take 20 mg by mouth daily.   Yes Historical Provider, MD  ezetimibe (ZETIA) 10 MG tablet Take 10 mg by mouth daily.    Yes Historical Provider, MD  folic acid (FOLVITE) 400 MCG tablet Take 400 mcg by mouth daily.    Yes Historical Provider, MD  furosemide (LASIX) 80 MG tablet Take 1 tablet (80 mg total) by mouth 2 (two) times daily. 06/29/12  Yes Vickki Hearing, MD  gabapentin (NEURONTIN) 100 MG tablet Take 100 mg by mouth at bedtime.    Yes Historical Provider, MD  insulin aspart (NOVOLOG) 100 UNIT/ML injection Inject 20 Units into the skin 3 (three) times daily with meals.   Yes Historical Provider, MD  insulin glargine (LANTUS) 100 UNIT/ML injection Inject 64 Units into the skin 2 (two) times daily.    Yes Historical Provider, MD  isosorbide mononitrate (IMDUR) 60 MG 24 hr tablet Take 60 mg by mouth daily.   Yes Historical Provider, MD  metolazone (ZAROXOLYN) 2.5 MG tablet Take 2.5 mg by mouth See admin instructions. Take 30 minutes prior to morning furosemide dose for a weight gain of 3 lbs or more   Yes Historical Provider, MD  Multiple Vitamin (MULTIVITAMIN WITH MINERALS) TABS Take 0.5 tablets by mouth 2 (two) times daily.   Yes Historical Provider, MD  ONE TOUCH ULTRA TEST test strip  03/22/13  Yes Historical Provider, MD    pantoprazole (PROTONIX) 40 MG tablet Take 40 mg by mouth daily.    Yes Historical Provider, MD  predniSONE (DELTASONE) 5 MG tablet Take 5 mg by mouth daily. 11/07/12  Yes Historical Provider, MD  tiotropium (SPIRIVA) 18 MCG inhalation capsule Place 18 mcg into inhaler and inhale at bedtime.   Yes Historical Provider, MD  nitroGLYCERIN (NITROSTAT) 0.4 MG SL tablet Place 0.4 mg under the tongue every 5 (five) minutes as needed for chest pain. May repeat up to 3 times    Historical Provider, MD    Inpatient Medications:  . [START ON 10/15/2013] ampicillin-sulbactam (UNASYN) IV  3 g Intravenous Q8H  . aspirin EC  325 mg Oral Daily  . azithromycin  500 mg Intravenous Q24H  . furosemide  40 mg Intravenous Q12H  . [START ON 10/15/2013] insulin aspart  0-9 Units Subcutaneous TID WC  . [START ON 10/15/2013] insulin glargine  30 Units Subcutaneous BID  . ipratropium-albuterol  3 mL Nebulization Q4H  . methylPREDNISolone (SOLU-MEDROL) injection  80 mg Intravenous Q12H  . pantoprazole (PROTONIX) IV  40 mg Intravenous Q24H  . sodium chloride  3 mL Intravenous Q12H  . tiotropium  18 mcg Inhalation QHS      Allergies:  Allergies  Allergen Reactions  . Donepezil Diarrhea  . Heparin Rash and Other (See Comments)    Family stated it almost killed him. Hallucinations and itching.  Marland Kitchen Hydralazine Other (See Comments)    DO NOT TAKE PER MD  . Hydromorphone Itching  . Morphine Itching  . Plavix [Clopidogrel Bisulfate] Other (See Comments)    GI BLEEDING; "so bad I had to take blood; dr took me off it"  . Promethazine Other (See Comments)    respiratory failure  . Sulfonamide Derivatives Itching  . Cefuroxime Other (See Comments)     unkown reaction  . Metoprolol Other (See Comments)    unknown  . Ertapenem Itching and Rash    12/31/2011 pt does not recall allergy or severity    History  Social History  . Marital Status: Married    Spouse Name: N/A    Number of Children: 5  . Years of  Education: 8 th   Occupational History  . 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 Smoking status: Former Smoker -- 2.50 packs/day for 45 years    Types: Cigarettes    Quit date: 05/25/1990  . Smokeless tobacco: Former Systems developer    Types: Chew    Quit date: 06/01/1990  . Alcohol Use: No     Comment: 12/31/2011 "used to drink; last alcohol has been 30 years I imagine"  . Drug Use: No  . Sexual Activity: Not Currently   Other Topics Concern  . Not on file   Social History Narrative   Daily caffeine use.  Lives in a house with his wife.  Uses a cane to ambulate and he has a walker, but he does not use it.  He does not cook or pay bills.  He does not drive.       Family History  Problem Relation Age of Onset  . Lung cancer Brother   . Diabetes Mother   . Arthritis Mother   . Heart attack Mother   . Heart disease Sister   . Stomach cancer Maternal Grandfather   . Heart disease Father   . Heart attack Father   . Heart disease Brother   . ALS Sister   . ALS      nephew/neice  . Colon cancer Neg Hx      Review of Systems: General: negative for chills, fever, night sweats or weight changes.  Cardiovascular: negative for chest pain, edema, orthopnea, palpitations, paroxysmal nocturnal dyspnea, shortness of breath or dyspnea on exertion Dermatological: negative for rash Respiratory: negative for cough or wheezing Urologic: negative for hematuria Abdominal: negative for nausea, vomiting, diarrhea, bright red blood per rectum, melena, or hematemesis Neurologic: negative for visual changes, syncope, or dizziness All other systems reviewed and are otherwise negative except as noted above.  Labs:  Recent Labs  10/14/13 2230  TROPONINI <0.30   Lab Results  Component Value Date   WBC 14.4* 10/14/2013   HGB 10.5* 10/14/2013   HCT 33.8* 10/14/2013   MCV 92.3 10/14/2013   PLT 267 10/14/2013    Recent Labs Lab 10/14/13 1625 10/14/13 2230  NA 141  --   K 4.6  --    CL 98  --   CO2 27  --   BUN 52*  --   CREATININE 2.13*  --   CALCIUM 9.3  --   PROT  --  7.3  BILITOT  --  0.4  ALKPHOS  --  79  ALT  --  16  AST  --  25  GLUCOSE 88  --    Lab Results  Component Value Date   CHOL 144 09/21/2012   HDL 24* 09/21/2012   LDLCALC UNABLE TO CALCULATE IF TRIGLYCERIDE OVER 400 mg/dL 09/21/2012   TRIG 425* 09/21/2012   Lab Results  Component Value Date   DDIMER 1.39* 11/20/2010   Troponins< 0.03  Radiology/Studies:  Dg Chest Port 1 View  10/14/2013   CLINICAL DATA:  80 year old male with shortness of breath.  EXAM: PORTABLE CHEST - 1 VIEW  COMPARISON:  03/03/2013  FINDINGS: Cardiomegaly and cardiac surgical changes again noted.  Mid right lung airspace opacity likely represents pneumonia or aspiration.  Mild peribronchial thickening is unchanged.  Elevation of the right hemidiaphragm is  again noted.  There is no evidence of pneumothorax, large pleural effusion or acute bony abnormality.  IMPRESSION: Mid right lung airspace opacity likely representing pneumonia or aspiration.  Cardiomegaly.   Electronically Signed   By: Hassan Rowan M.D.   On: 10/14/2013 17:27    EKG: NSR, ST depression in V4-V^ . In c/w EKG from 02/2013 ST depression have subtle increase. Overall appearance is unchanged   Physical Exam: Blood pressure 158/37, pulse 67, temperature 98.6 F (37 C), temperature source Oral, resp. rate 20, height 5\' 6"  (1.676 m), weight 109.6 kg (241 lb 10 oz), SpO2 94.00%. General: Well developed, well nourished, in no acute distress. Head: Normocephalic, atraumatic, sclera non-icteric, no xanthomas, nares are without discharge.  Neck: Negative for carotid bruits. JVD not elevated. Lungs: Clear bilaterally to auscultation without wheezes, rales, or rhonchi. Breathing is unlabored. Heart: RRR with S1 S2. No murmurs, rubs, or gallops appreciated. Abdomen: Soft, non-tender, non-distended with normoactive bowel sounds. No hepatomegaly. No rebound/guarding. No  obvious abdominal masses. Msk:  Strength and tone appear normal for age. Extremities: No clubbing or cyanosis. No edema.  Distal pedal pulses are 2+ and equal bilaterally. Neuro: Alert and oriented X 3. No facial asymmetry. No focal deficit. Moves all extremities spontaneously. Psych:  Responds to questions appropriately with a normal affect.     Assessment and Plan: Ass -Abnormal EKG- overall does not appear significantly changed  additionally pt  Has no chest pain and CE are negative. Current symptoms appear to be related to pneumonia - hx of CAD  - penumonia   Plan  Continue monitor on tele and medical management for seconady prevention of CAD ( including aspirin and statin )   No further work up at this time    Signed, Grafton Folk  M.D  10/14/2013, 11:17 PM

## 2013-10-15 LAB — GLUCOSE, CAPILLARY
Glucose-Capillary: 277 mg/dL — ABNORMAL HIGH (ref 70–99)
Glucose-Capillary: 304 mg/dL — ABNORMAL HIGH (ref 70–99)
Glucose-Capillary: 350 mg/dL — ABNORMAL HIGH (ref 70–99)
Glucose-Capillary: 364 mg/dL — ABNORMAL HIGH (ref 70–99)

## 2013-10-15 LAB — TROPONIN I
Troponin I: <0.3 ng/mL (ref <0.30)
Troponin I: <0.3 ng/mL (ref <0.30)
Troponin I: <0.3 ng/mL (ref <0.30)

## 2013-10-15 LAB — HIV ANTIBODY (ROUTINE TESTING W REFLEX): HIV: NONREACTIVE

## 2013-10-15 MED ORDER — INSULIN ASPART 100 UNIT/ML ~~LOC~~ SOLN
0.0000 [IU] | Freq: Three times a day (TID) | SUBCUTANEOUS | Status: DC
Start: 2013-10-15 — End: 2013-10-16
  Administered 2013-10-15: 9 [IU] via SUBCUTANEOUS
  Administered 2013-10-15 – 2013-10-16 (×2): 7 [IU] via SUBCUTANEOUS
  Administered 2013-10-16: 3 [IU] via SUBCUTANEOUS
  Administered 2013-10-16: 8 [IU] via SUBCUTANEOUS

## 2013-10-15 MED ORDER — INSULIN ASPART 100 UNIT/ML ~~LOC~~ SOLN
0.0000 [IU] | SUBCUTANEOUS | Status: DC
  Administered 2013-10-15: 7 [IU] via SUBCUTANEOUS

## 2013-10-15 MED ORDER — METHYLPREDNISOLONE SODIUM SUCC 125 MG IJ SOLR
60.0000 mg | Freq: Once | INTRAMUSCULAR | Status: AC
  Administered 2013-10-16: 60 mg via INTRAVENOUS
  Filled 2013-10-15: qty 0.96

## 2013-10-15 MED ORDER — ACETAMINOPHEN 325 MG PO TABS
650.0000 mg | ORAL_TABLET | Freq: Four times a day (QID) | ORAL | Status: DC | PRN
  Administered 2013-10-15: 650 mg via ORAL
  Filled 2013-10-15 (×2): qty 2

## 2013-10-15 MED ORDER — IPRATROPIUM-ALBUTEROL 0.5-2.5 (3) MG/3ML IN SOLN
3.0000 mL | Freq: Three times a day (TID) | RESPIRATORY_TRACT | Status: DC
Start: 2013-10-15 — End: 2013-10-19
  Administered 2013-10-15 – 2013-10-19 (×12): 3 mL via RESPIRATORY_TRACT
  Filled 2013-10-15 (×11): qty 3

## 2013-10-15 NOTE — Progress Notes (Signed)
Subjective:  No complaints of chest pain at the present time. Admitted with possible pneumonia. Not really coughing or short of breath  Objective:  Vital Signs in the last 24 hours: BP 123/39  Pulse 64  Temp(Src) 98.3 F (36.8 C) (Axillary)  Resp 18  Ht 5\' 6"  (1.676 m)  Wt 110.1 kg (242 lb 11.6 oz)  BMI 39.20 kg/m2  SpO2 90%  Physical Exam: Obese pleasant male in no acute distress Lungs:  Reduced breath sounds  Cardiac:  Regular rhythm, normal S1 and S2, no S3 Abdomen:  Soft, nontender, no masses Extremities:  No edema present  Intake/Output from previous day: 05/23 0701 - 05/24 0700 In: 500 [I.V.:500] Out: 300 [Urine:300]  Weight Filed Weights   10/14/13 1952 10/15/13 0427  Weight: 109.6 kg (241 lb 10 oz) 110.1 kg (242 lb 11.6 oz)    Lab Results: Basic Metabolic Panel:  Recent Labs  10/14/13 1625  NA 141  K 4.6  CL 98  CO2 27  GLUCOSE 88  BUN 52*  CREATININE 2.13*   CBC:  Recent Labs  10/14/13 1625 10/14/13 2230  WBC 14.4*  --   NEUTROABS  --  12.1*  HGB 10.5*  --   HCT 33.8*  --   MCV 92.3  --   PLT 267  --    Cardiac Enzymes:  Recent Labs  10/14/13 2230 10/15/13 0426 10/15/13 0905  TROPONINI <0.30 <0.30 <0.30   Telemetry: Sinus rhythm  Assessment/Plan:  1. Abnormal EKG which is at baseline 2. Stage IV chronic kidney disease 3. Pneumonia 4. Chronic diastolic heart failure compensated  Recommendations:  No further cardiac assessment for evaluation needed this admission. We'll sign off. Call if problems.   Kerry Hough  MD [facility]  10/15/2013, 11:20 AM

## 2013-10-15 NOTE — Evaluation (Addendum)
Clinical/Bedside Swallow Evaluation Patient Details  Name: [name] MRN: [id number] Date of Birth: 01-Jun-1933  Today's Date: 10/15/2013 Time: 1145-1200 SLP Time Calculation (min): 15 min  Past Medical History:  Past Medical History  Diagnosis Date  . Adenomatous colon polyp   . Diverticulosis   . GERD (gastroesophageal reflux disease)   . Hiatal hernia   . Alzheimer disease   . Gout     "hands; backbone; elbows; shoulders"  . Asthma with bronchitis   . COPD (chronic obstructive pulmonary disease)   . Chronic hypoxemic respiratory failure   . OSA (obstructive sleep apnea)     CPAP  . CHF (congestive heart failure)     grade 2 diastolic dysfunction, EF 32-35% on ECHO 12/2011  . Blood transfusion     "I've had about 6 pints" (12/31/2011)  . Pneumonia     intermittent  . Anemia   . GI bleed     GI [name],   . Hypertension   . Peripheral vascular disease     PAD  . Dysrhythmia     "skips"  . Anginal pain   . Myocardial infarction     "they said I had 2 light ones"  . Shortness of breath 12/31/2011    "all the time"  . Type II diabetes mellitus     with complications:  retinopathy, nephrophathy.   . Arthritis     "plenty"  . Chronic lower back pain   . Renal insufficiency     "went on dialysis probably 3 times" Last 04/2011  . Skin cancer     "forehead; arms"  . Depression   . Diverticulosis   . Colon polyps     adenomatous and hyperplastic  . GERD (gastroesophageal reflux disease)   . Hiatal hernia   . Memory deficits 01/11/2013  . Obesity   . Carotid bruit     bilateral  . Gout   . CKD (chronic kidney disease) 03/07/2013  . Hemorrhoids   . AVM (arteriovenous malformation) of colon   . Gastric polyp   . Atrial fibrillation    Past Surgical History:  Past Surgical History  Procedure Laterality Date  . Hand surgery Left 1990s    "chain saw accident; had to reattach fingers on left hand"  . Esophagogastroduodenoscopy  05/05/2011    Procedure:  ESOPHAGOGASTRODUODENOSCOPY (EGD);  Surgeon: Inda Castle, MD;  Location: [facility];  Service: Endoscopy;  Laterality: N/A;  . Flexible sigmoidoscopy  05/05/2011    Procedure: FLEXIBLE SIGMOIDOSCOPY;  Surgeon: Inda Castle, MD;  Location: [facility];  Service: Endoscopy;  Laterality: N/A;  . Laparoscopic cholecystectomy  ~ 2001  . Excisional hemorrhoidectomy  1950's  . Cataract extraction w/ intraocular lens  implant, bilateral Bilateral   . Coronary artery bypass graft  1997    CABG X2 vessels  . Iliac artery stent  11/2011    left/H&P  . Renal artery stent  2010    left/H&P  . Iliac artery stent  11/2011    right  . Coronary angioplasty with stent placement  12/31/2011    "I have 15 stents; heart, kidney, aorta, legs"  . Colonoscopy w/ biopsies and polypectomy      "have had probably 5 cut out" (12/31/2011)  . Enteroscopy  03/04/2012    Procedure: ENTEROSCOPY;  Surgeon: Inda Castle, MD;  Location: [place];  Service: Endoscopy;  Laterality: N/A;  jessica/leone  . Esophagogastroduodenoscopy N/A 03/04/2013    Procedure: ESOPHAGOGASTRODUODENOSCOPY (EGD);  Surgeon: Herbie Baltimore  Shaaron Adler, MD;  Location: [place];  Service: Endoscopy;  Laterality: N/A;  . Colonoscopy N/A 03/05/2013    Procedure: COLONOSCOPY;  Surgeon: Inda Castle, MD;  Location: [place];  Service: Endoscopy;  Laterality: N/A;   HPI:  80 year old man with history of  Poorly controlled HTN, DM2 (A1C 9.9% in 08/2012), , CAD s/p CABG x 2 in 1997 with chronic stable angina , HFpEF  (EF 65-70% with grade 2 diastolic dysfunction in 09/1759), paroxysmal atrial fibrillation, CKD2-3, peripheral vascular disease with iliac intervention and chronic claudication,  OSA on CPAP who presents with shortness of breath and worked up to have pneumonia  CXR:  Mid right lung airspace opacity likely representing pneumonia or aspiration.  BSE ordered due to results of CXR indicating possible aspiration.      Assessment / Plan /  Recommendation Clinical Impression  BSE completed.  Patient transferred to chair from bathroom by RN prior to evaluation.  Oxygen per nasal cannula applied by RN as SOB.  O2 saturation 97%prior to PO trials and maintained  throughout evaluation.  No outward clinical s/s of aspiration noted with any consistency but noted with slight difficulty coordinating swallow with respiration.   Recommend to proceed with regular consistency and thin liquids with basic swallow precautions.  Patient demonstrated understanding of recommendations.   No outward clinical s/s of aspiration noted but recommend continued Skilled ST briefly for diet tolerance to ensure safety  due to results of current CXR and current respiratory status.      Aspiration Risk  Mild    Diet Recommendation Regular;Thin liquid   Liquid Administration via: Cup;No straw Medication Administration: Whole meds with liquid Supervision: Patient able to self feed Compensations: Slow rate;Small sips/bites Postural Changes and/or Swallow Maneuvers: Out of bed for meals    Other  Recommendations Oral Care Recommendations: Oral care Q4 per protocol   Follow Up Recommendations  None    Frequency and Duration min 1 x/week  1 week   Pertinent Vitals/Pain O2 saturations 97%    SLP Swallow Goals Please refer to Care Plans   Swallow Study Prior Functional Status  No prior history of dysphagia     General Date of Onset: 10/14/13 HPI: 80 year old man with history of  Poorly controlled HTN, DM2 (A1C 9.9% in 08/2012), , CAD s/p CABG x 2 in 1997 with chronic stable angina , HFpEF  (EF 65-70% with grade 2 diastolic dysfunction in 10/735), paroxysmal atrial fibrillation, CKD2-3, peripheral vascular disease with iliac intervention and chronic claudication,  OSA on CPAP who presents with shortness of breath and worked up to have pneumonia   Type of Study: Bedside swallow evaluation Diet Prior to this Study: NPO Temperature Spikes Noted:  No Respiratory Status: Nasal cannula History of Recent Intubation: No Behavior/Cognition: Alert;Cooperative;Pleasant mood Oral Cavity - Dentition: Dentures, top;Dentures, bottom Self-Feeding Abilities: Able to feed self Patient Positioning: Upright in chair Baseline Vocal Quality: Clear Volitional Cough: Strong Volitional Swallow: Able to elicit    Oral/Motor/Sensory Function Overall Oral Motor/Sensory Function: Appears within functional limits for tasks assessed   Ice Chips Ice chips: Not tested   Thin Liquid Thin Liquid: Within functional limits Presentation: Cup    Nectar Thick Nectar Thick Liquid: Not tested   Honey Thick Honey Thick Liquid: Not tested   Puree Puree: Within functional limits   Solid   GO    Solid: Within functional limits      Sharman Crate [place], [place] Chryl Heck Celines Femia 10/15/2013,12:17  PM

## 2013-10-15 NOTE — Progress Notes (Signed)
Subjective: No acute events overnight.  Patient seen and examined this AM. Feels better, not dyspneic on room air, no chest pain.    Objective: Vital signs in last 24 hours: Filed Vitals:   10/14/13 2344 10/15/13 0135 10/15/13 0427 10/15/13 0435  BP:  135/45 123/39   Pulse:  63 61 64  Temp:  97.6 F (36.4 C) 98.3 F (36.8 C)   TempSrc:  Axillary Axillary   Resp:  20 19 18   Height:      Weight:   110.1 kg (242 lb 11.6 oz)   SpO2: 93% 90% 90%    Weight change:   Intake/Output Summary (Last 24 hours) at 10/15/13 1258 Last data filed at 10/15/13 1100  Gross per 24 hour  Intake    700 ml  Output    600 ml  Net    100 ml   General: resting in bed in NAD HEENT: no gross abnormality Cardiac: RRR, no rubs, murmurs or gallops Pulm: mild crackles RLL, no wheezing, moving normal volumes of air Abd: soft, nontender, nondistended, BS present Ext: warm and well perfused, no pedal edema Neuro: alert and oriented X3, responding appropriately  Lab Results: Basic Metabolic Panel:  Recent Labs Lab 10/14/13 1625 10/14/13 2230  NA 141  --   K 4.6  --   CL 98  --   CO2 27  --   GLUCOSE 88  --   BUN 52*  --   CREATININE 2.13*  --   CALCIUM 9.3  --   MG  --  2.4   Liver Function Tests:  Recent Labs Lab 10/14/13 2230  AST 25  ALT 16  ALKPHOS 79  BILITOT 0.4  PROT 7.3  ALBUMIN 3.5   CBC:  Recent Labs Lab 10/14/13 1625 10/14/13 2230  WBC 14.4*  --   NEUTROABS  --  12.1*  HGB 10.5*  --   HCT 33.8*  --   MCV 92.3  --   PLT 267  --    Cardiac Enzymes:  Recent Labs Lab 10/14/13 2230 10/15/13 0426 10/15/13 0905  TROPONINI <0.30 <0.30 <0.30   BNP:  Recent Labs Lab 10/14/13 1625  PROBNP 895.7*   CBG:  Recent Labs Lab 10/14/13 1622 10/14/13 2220 10/15/13 0641 10/15/13 1008  GLUCAP 98 194* 277* 304*   Urinalysis:  Recent Labs Lab 10/14/13 1725  COLORURINE YELLOW  LABSPEC 1.016  PHURINE 6.0  GLUCOSEU NEGATIVE  HGBUR NEGATIVE  BILIRUBINUR  NEGATIVE  KETONESUR NEGATIVE  PROTEINUR 100*  UROBILINOGEN 0.2  NITRITE NEGATIVE  LEUKOCYTESUR NEGATIVE    Studies/Results: Dg Chest Port 1 View  10/14/2013   CLINICAL DATA:  80 year old male with shortness of breath.  EXAM: PORTABLE CHEST - 1 VIEW  COMPARISON:  03/03/2013  FINDINGS: Cardiomegaly and cardiac surgical changes again noted.  Mid right lung airspace opacity likely represents pneumonia or aspiration.  Mild peribronchial thickening is unchanged.  Elevation of the right hemidiaphragm is again noted.  There is no evidence of pneumothorax, large pleural effusion or acute bony abnormality.  IMPRESSION: Mid right lung airspace opacity likely representing pneumonia or aspiration.  Cardiomegaly.   Electronically Signed   By: Hassan Rowan M.D.   On: 10/14/2013 17:27   Medications: I have reviewed the patient's current medications. Scheduled Meds: . ampicillin-sulbactam (UNASYN) IV  3 g Intravenous Q8H  . aspirin EC  325 mg Oral Daily  . azithromycin  500 mg Intravenous Q24H  . furosemide  40 mg Intravenous Q12H  .  insulin aspart  0-9 Units Subcutaneous Q4H  . insulin glargine  30 Units Subcutaneous BID  . ipratropium-albuterol  3 mL Nebulization Q4H  . pantoprazole (PROTONIX) IV  40 mg Intravenous Q24H  . sodium chloride  3 mL Intravenous Q12H  . tiotropium  18 mcg Inhalation QHS   Continuous Infusions: none  PRN Meds:.sodium chloride, nitroGLYCERIN, sodium chloride  Assessment/Plan: CAP vs. Aspiration PNA: Patient presented with increased shortness of breath x 4 days with cough productive of gray sputum. Tmax to 101.1 in ED. Leucocytosis to 14.4 but patient does not meet other SIRS criteria. CXR showed mid-right lung airspace opacity, likely aspiration vs PNA. Unlikely PE given Well's score of 0. Patient uses 3L home O2 PRN.  Says his symptoms are improving this AM. He denies dyspnea, chest pain and breathing comfortably on RA.  He says he sometimes coughs with food but unclear if he is  aspirating.   SLP swallow eval complete, no S&S of aspiration but recommends continue skilled ST briefly for diet tolerance to ensure safety - recommend regular diet/thin liquid; no follow-up recommended. -continue Unasyn and azithromycin  -sputum culture pending  -Legionella, Strep pneumo urinary antigens pending  -respiratory virus panel pending   COPD exacerbation: Patient presented with increased dyspnea, cough, and sputum production along with wheezing, consistent with COPD exacerbation.  PFTs in 2012 showed mild obstructive airway disease with moderately reduced diffusion capacity. Patient uses albuterol neb QID, Spiriva qhs at home as well as O2 prn. Of note, patient takes prednisone 5 mg daily at home for gout.  No wheezing today. -Solumedrol IV 80 mg today, taper tomorrow -duonebs q4h  -continue home Spiriva  -RN to ambulate with pulse ox  CAD: s/p CABG x 2- Patient denies chest pain. slight ST depressions on EKG appear long-standing and slightly improved on AM EKG.  Cardiology was consulted and felt changes unimpressive.  CE neg x 3.   -continue ASA   CHF: pBNP relatively stable at 895, 820 in 02/2013. Little concern for acute exacerbation given diastolic dysfunction with no lower extremity swelling, no orthopnea or PND; furthermore, symptoms better explained by above. Patient takes Lasix 80 mg BID, metolazone prn weight gain at home. Of note, patient did received NS 1L bolus in ED.  -Lasix IV 40 mg BID  -holding metolazone for now   Uncontrolled DM2: A1C 9.9% in 08/2012, currently in 8s per wife. Patient takes Lantus 65 units BID, Novolog 20 units TID AC. Glucose on admission BMP 88.  -CBGs AC/hs  -decrease Lantus to 30 units BID  -SSI-sensitive   HTN- Currently normotensive. Patient on amlodipine 10 mg daily, clonidine 0.1 mg BID at home.  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  -monitor BMP  History of atrial  fibrillation- Currently in sinus rhythm.  -continue telemetry   Diet:  Heart/carb mod VTE ppx: SCDx, no pharmacologic VTE in the setting of heparin allergy Code status:  Full code  Dispo: Disposition is deferred at this time, awaiting improvement of current medical problems.  Anticipated discharge in approximately 2-3 day(s).   The patient does have a current PCP Redmond Pulling Arna Medici, MD) and does not need an [facility] hospital follow-up appointment after discharge.  The patient does not know have transportation limitations that hinder transportation to clinic appointments.  .Services Needed at time of discharge: Y = Yes, Blank = No PT:   OT:   RN:   Equipment:   Other:  LOS: 1 day   Duwaine Maxin, DO 10/15/2013, 12:58 PM

## 2013-10-15 NOTE — H&P (Signed)
Internal Medicine On-Call Attending Admission Note Date: 10/15/2013  Patient name: [name] Medical record number: [id number] Date of birth: [birth year] Age: 80 y.o. Gender: male  I saw and evaluated the patient. I reviewed the resident's note and I agree with the resident's findings and plan as documented in the resident's note, with the following additional comments.  Chief Complaint(s): Shortness of breath, cough  History - key components related to admission: Patient is a 80 year old man with history of hypertension, type 2 diabetes mellitus, COPD, coronary artery disease status post CABG, gout, diastolic congestive heart failure, paroxysmal atrial fibrillation, chronic anemia due to GI blood loss from AVM, chronic kidney disease, and other problems as outlined in the medical history, admitted with worsening shortness of breath and cough.     Physical Exam - key components related to admission:  Filed Vitals:   10/14/13 2344 10/15/13 0135 10/15/13 0427 10/15/13 0435  BP:  135/45 123/39   Pulse:  63 61 64  Temp:  97.6 F (36.4 C) 98.3 F (36.8 C)   TempSrc:  Axillary Axillary   Resp:  20 19 18   Height:      Weight:   242 lb 11.6 oz (110.1 kg)   SpO2: 93% 90% 90%    General: Alert, no acute distress Lungs: Right basilar crackles; no wheezing Heart: Regular; no extra sounds or murmurs Abdomen: Bowel sounds present, soft, nontender Extremities: No edema   Lab results:   Basic Metabolic Panel:  Recent Labs  10/14/13 1625 10/14/13 2230  NA 141  --   K 4.6  --   CL 98  --   CO2 27  --   GLUCOSE 88  --   BUN 52*  --   CREATININE 2.13*  --   CALCIUM 9.3  --   MG  --  2.4    Liver Function Tests:  Recent Labs  10/14/13 2230  AST 25  ALT 16  ALKPHOS 79  BILITOT 0.4  PROT 7.3  ALBUMIN 3.5     CBC:  Recent Labs  10/14/13 1625  WBC 14.4*  HGB 10.5*  HCT 33.8*  MCV 92.3  PLT 267    Recent Labs  10/14/13 2230  NEUTROABS 12.1*  LYMPHSABS  1.4  MONOABS 1.5*  EOSABS 0.3  BASOSABS 0.0    Cardiac Enzymes:  Recent Labs  10/14/13 2230 10/15/13 0426 10/15/13 0905  TROPONINI <0.30 <0.30 <0.30    BNP:  Recent Labs  10/14/13 1625  PROBNP 895.7*     CBG:  Recent Labs  10/14/13 1622 10/14/13 2220 10/15/13 0641 10/15/13 1008  GLUCAP 98 194* 277* 304*      Thyroid Function Tests:  Recent Labs  10/14/13 2230  TSH 3.100      Urinalysis    Component Value Date/Time   COLORURINE YELLOW 10/14/2013 [place] 10/14/2013 1725   LABSPEC 1.016 10/14/2013 1725   PHURINE 6.0 10/14/2013 1725   GLUCOSEU NEGATIVE 10/14/2013 1725   HGBUR NEGATIVE 10/14/2013 [place] 10/14/2013 1725   KETONESUR NEGATIVE 10/14/2013 1725   PROTEINUR 100* 10/14/2013 1725   UROBILINOGEN 0.2 10/14/2013 1725   NITRITE NEGATIVE 10/14/2013 1725   LEUKOCYTESUR NEGATIVE 10/14/2013 1725    Urine microscopic:  Recent Labs  10/14/13 1725  OTHERU AMORPHOUS URATES/PHOSPHATES     Imaging results:  Dg Chest Port 1 View  10/14/2013   CLINICAL DATA:  80 year old male with shortness of breath.  EXAM: PORTABLE CHEST - 1 VIEW  COMPARISON:  03/03/2013  FINDINGS: Cardiomegaly and cardiac surgical changes again noted.  Mid right lung airspace opacity likely represents pneumonia or aspiration.  Mild peribronchial thickening is unchanged.  Elevation of the right hemidiaphragm is again noted.  There is no evidence of pneumothorax, large pleural effusion or acute bony abnormality.  IMPRESSION: Mid right lung airspace opacity likely representing pneumonia or aspiration.  Cardiomegaly.   Electronically Signed   By: Hassan Rowan M.D.   On: 10/14/2013 17:27    Other results: EKG: Sinus rhythm; repol abnrm suggests ischemia, diffuse leads; ST changes seen on prior, but are now more prominent in V3-V6  Assessment & Plan by Problem:  1.  Community acquired pneumonia.  Patient presents with shortness of breath, cough, fever, and  chest x-ray findings consistent with pneumonia.  Plan is empiric IV antibiotics pending blood and sputum culture results; supplemental oxygen and follow saturations; supportive care.  Given concern about possible aspiration, Unasyn plus azithromycin were chosen as antibiotic regimen.  2.  COPD.  Patient has COPD and reports that he wears oxygen at night with his CPAP.  He received initial steroids here in addition to inhaled bronchodilators.  He currently has no wheezing on lung exam.  Plan is continue inhaled bronchodilators; supplemental oxygen and follow saturations; wean steroids.  3.  Coronary artery disease.  Patient has no chest pain, and his symptoms are consistent with pneumonia.  His EKG changes are similar to prior EKGs although somewhat more prominent; this was discussed by the night float team with cardiology, who saw patient and felt that there was nothing acute based on the EKG appearance.  4.  Other problems and plans as per the resident physician's note.

## 2013-10-16 ENCOUNTER — Encounter (HOSPITAL_COMMUNITY): Payer: Self-pay | Admitting: General Practice

## 2013-10-16 DIAGNOSIS — N189 Chronic kidney disease, unspecified: Secondary | ICD-10-CM

## 2013-10-16 DIAGNOSIS — I214 Non-ST elevation (NSTEMI) myocardial infarction: Secondary | ICD-10-CM

## 2013-10-16 DIAGNOSIS — I2510 Atherosclerotic heart disease of native coronary artery without angina pectoris: Secondary | ICD-10-CM

## 2013-10-16 LAB — CBC WITH DIFFERENTIAL/PLATELET
Basophils Absolute: 0 10*3/uL (ref 0.0–0.1)
Basophils Relative: 0 % (ref 0–1)
EOS ABS: 0 10*3/uL (ref 0.0–0.7)
Eosinophils Relative: 0 % (ref 0–5)
HEMATOCRIT: 30.8 % — AB (ref 39.0–52.0)
HEMOGLOBIN: 9.3 g/dL — AB (ref 13.0–17.0)
Lymphocytes Relative: 4 % — ABNORMAL LOW (ref 12–46)
Lymphs Abs: 0.6 10*3/uL — ABNORMAL LOW (ref 0.7–4.0)
MCH: 27.8 pg (ref 26.0–34.0)
MCHC: 30.2 g/dL (ref 30.0–36.0)
MCV: 92.2 fL (ref 78.0–100.0)
MONO ABS: 1.3 10*3/uL — AB (ref 0.1–1.0)
MONOS PCT: 9 % (ref 3–12)
Neutro Abs: 12.3 10*3/uL — ABNORMAL HIGH (ref 1.7–7.7)
Neutrophils Relative %: 87 % — ABNORMAL HIGH (ref 43–77)
Platelets: 216 10*3/uL (ref 150–400)
RBC: 3.34 MIL/uL — ABNORMAL LOW (ref 4.22–5.81)
RDW: 17.7 % — ABNORMAL HIGH (ref 11.5–15.5)
WBC: 14.2 10*3/uL — ABNORMAL HIGH (ref 4.0–10.5)

## 2013-10-16 LAB — BASIC METABOLIC PANEL
BUN: 59 mg/dL — AB (ref 6–23)
CHLORIDE: 100 meq/L (ref 96–112)
CO2: 27 mEq/L (ref 19–32)
CREATININE: 1.94 mg/dL — AB (ref 0.50–1.35)
Calcium: 9.2 mg/dL (ref 8.4–10.5)
GFR calc Af Amer: 36 mL/min — ABNORMAL LOW (ref >90)
GFR, EST NON AFRICAN AMERICAN: 31 mL/min — AB (ref >90)
Glucose, Bld: 356 mg/dL — ABNORMAL HIGH (ref 70–99)
Potassium: 5.1 mEq/L (ref 3.7–5.3)
Sodium: 139 mEq/L (ref 137–147)

## 2013-10-16 LAB — URINE CULTURE

## 2013-10-16 LAB — MRSA PCR SCREENING: MRSA BY PCR: POSITIVE — AB

## 2013-10-16 LAB — GLUCOSE, CAPILLARY
GLUCOSE-CAPILLARY: 327 mg/dL — AB (ref 70–99)
Glucose-Capillary: 308 mg/dL — ABNORMAL HIGH (ref 70–99)
Glucose-Capillary: 243 mg/dL — ABNORMAL HIGH (ref 70–99)
Glucose-Capillary: 411 mg/dL — ABNORMAL HIGH (ref 70–99)

## 2013-10-16 LAB — TROPONIN I: Troponin I: 3.13 ng/mL (ref <0.30)

## 2013-10-16 MED ORDER — ATORVASTATIN CALCIUM 20 MG PO TABS
20.0000 mg | ORAL_TABLET | Freq: Every day | ORAL | Status: DC
  Administered 2013-10-17 – 2013-10-18 (×2): 20 mg via ORAL
  Filled 2013-10-16 (×3): qty 1

## 2013-10-16 MED ORDER — ESCITALOPRAM OXALATE 20 MG PO TABS
20.0000 mg | ORAL_TABLET | Freq: Every day | ORAL | Status: DC
  Administered 2013-10-16 – 2013-10-19 (×4): 20 mg via ORAL
  Filled 2013-10-16 (×4): qty 1

## 2013-10-16 MED ORDER — HEPARIN BOLUS VIA INFUSION
4000.0000 [IU] | Freq: Once | INTRAVENOUS | Status: AC
  Administered 2013-10-17: 4000 [IU] via INTRAVENOUS
  Filled 2013-10-16: qty 4000

## 2013-10-16 MED ORDER — EZETIMIBE 10 MG PO TABS
10.0000 mg | ORAL_TABLET | Freq: Every day | ORAL | Status: DC
  Administered 2013-10-16 – 2013-10-19 (×4): 10 mg via ORAL
  Filled 2013-10-16 (×4): qty 1

## 2013-10-16 MED ORDER — INSULIN ASPART 100 UNIT/ML ~~LOC~~ SOLN
0.0000 [IU] | Freq: Three times a day (TID) | SUBCUTANEOUS | Status: DC
  Administered 2013-10-17: 3 [IU] via SUBCUTANEOUS
  Administered 2013-10-17: 5 [IU] via SUBCUTANEOUS
  Administered 2013-10-17: 3 [IU] via SUBCUTANEOUS
  Administered 2013-10-18: 8 [IU] via SUBCUTANEOUS
  Administered 2013-10-18: 5 [IU] via SUBCUTANEOUS
  Administered 2013-10-18 – 2013-10-19 (×2): 3 [IU] via SUBCUTANEOUS
  Administered 2013-10-19: 5 [IU] via SUBCUTANEOUS

## 2013-10-16 MED ORDER — ISOSORBIDE MONONITRATE ER 60 MG PO TB24
60.0000 mg | ORAL_TABLET | Freq: Every day | ORAL | Status: DC
  Administered 2013-10-16 – 2013-10-19 (×4): 60 mg via ORAL
  Filled 2013-10-16 (×4): qty 1

## 2013-10-16 MED ORDER — HEPARIN (PORCINE) IN NACL 100-0.45 UNIT/ML-% IJ SOLN
1600.0000 [IU]/h | INTRAMUSCULAR | Status: DC
Start: 2013-10-17 — End: 2013-10-18
  Administered 2013-10-17: 1400 [IU]/h via INTRAVENOUS
  Administered 2013-10-18: 1600 [IU]/h via INTRAVENOUS
  Filled 2013-10-16 (×4): qty 250

## 2013-10-16 MED ORDER — AMLODIPINE BESYLATE 10 MG PO TABS
10.0000 mg | ORAL_TABLET | Freq: Every day | ORAL | Status: DC
  Filled 2013-10-16: qty 1

## 2013-10-16 MED ORDER — AMLODIPINE BESYLATE 10 MG PO TABS
10.0000 mg | ORAL_TABLET | Freq: Every day | ORAL | Status: DC
  Administered 2013-10-16 – 2013-10-18 (×3): 10 mg via ORAL
  Filled 2013-10-16 (×4): qty 1

## 2013-10-16 MED ORDER — CENTRAL NICU FLUSH (1/2 NS + HEPARIN 0.5 UNIT/ML): 0.5000 mL | INJECTION | INTRAVENOUS | Status: DC | PRN

## 2013-10-16 NOTE — Progress Notes (Signed)
Subjective:   CTSP due to abnormal trop. Patient with CAD s/p CABG admitted with PNA. Initial ECG abnormal with lateral ST scooping/depression. Seen by Cardiology and changes felt to be chronic. Initial troponins normal but had recurrent CP and f/u troponin now 3.13. Now CP free.   Baseline CR ~ 2.0.  (GFR 31)   Objective:  Vital Signs in the last 24 hours: BP 154/58  Pulse 79  Temp(Src) 97.6 F (36.4 C) (Oral)  Resp 18  Ht 5\' 6"  (1.676 m)  Wt 108.636 kg (239 lb 8 oz)  BMI 38.67 kg/m2  SpO2 98%  Physical Exam: Obese pleasant male in no acute distress Lungs:  Reduced breath sounds  Cardiac:  Regular rhythm, normal S1 and S2, no S3 Abdomen:  Soft, nontender, no masses Extremities:  No edema present  Intake/Output from previous day: 05/24 0701 - 05/25 0700 In: 1440 [P.O.:540; IV Piggyback:900] Out: 1550 [Urine:1550]  Weight Filed Weights   10/14/13 1952 10/15/13 0427 10/16/13 0654  Weight: 109.6 kg (241 lb 10 oz) 110.1 kg (242 lb 11.6 oz) 108.636 kg (239 lb 8 oz)    Lab Results: Basic Metabolic Panel:  Recent Labs  10/14/13 1625 10/16/13 0432  NA 141 139  K 4.6 5.1  CL 98 100  CO2 27 27  GLUCOSE 88 356*  BUN 52* 59*  CREATININE 2.13* 1.94*   CBC:  Recent Labs  10/14/13 1625 10/14/13 2230 10/16/13 0432  WBC 14.4*  --  14.2*  NEUTROABS  --  12.1* 12.3*  HGB 10.5*  --  9.3*  HCT 33.8*  --  30.8*  MCV 92.3  --  92.2  PLT 267  --  216   Cardiac Enzymes:  Recent Labs  10/15/13 0905 10/15/13 2132 10/16/13 1837  TROPONINI <0.30 <0.30 3.13*   Telemetry: Sinus rhythm  Assessment/Plan:  1. NSTEMI 2. CAD s/p CABG 3. Stage IV chronic kidney disease 4. Pneumonia 5. Chronic diastolic heart failure compensated  Recommendations:  He has suffered NSTEMI, likel demand ischemia. Will start heparin. Continue ASA, statin. No b-blocker due to resp compromise. With Stage IV renal failure will start with Myoview in am to further risk stratify. If high risk  will likely need cath.  Thomas Pascal Kaileb Monsanto,MD 9:53 PM

## 2013-10-16 NOTE — Progress Notes (Signed)
Report given to receiving RN. Patient in bed resting, with family at the bedside. No verbal complaints and no signs or symptoms of distress noted. 

## 2013-10-16 NOTE — Progress Notes (Addendum)
Subjective: Chest pain overnight, trop neg, resolved today.  [name] was seen and examined today.  He is feeling ok.  Denies chest pain or dyspnea.    Objective: Vital signs in last 24 hours: Filed Vitals:   10/15/13 1411 10/15/13 2034 10/16/13 0219 10/16/13 0654  BP: 135/70 149/65 137/56   Pulse: 82 92 69   Temp: 98 F (36.7 C) 98.3 F (36.8 C)    TempSrc: Oral Oral    Resp: 20 18 20    Height:      Weight:    108.636 kg (239 lb 8 oz)  SpO2: 93% 97% 99%    Weight change: -0.964 kg (-2 lb 2 oz)  Intake/Output Summary (Last 24 hours) at 10/16/13 0807 Last data filed at 10/16/13 0255  Gross per 24 hour  Intake   1440 ml  Output   1550 ml  Net   -110 ml   General: sitting up chair in NAD HEENT: no gross abnormality Cardiac: RRR, no rubs, murmurs or gallops Pulm: mild crackles RLL, no wheezing, moving normal volumes of air Abd: soft, nontender, nondistended, BS present Ext: warm and well perfused, no pedal edema Neuro: alert and oriented X3, responding appropriately  Lab Results: Basic Metabolic Panel:  Recent Labs Lab 10/14/13 1625 10/14/13 2230 10/16/13 0432  NA 141  --  139  K 4.6  --  5.1  CL 98  --  100  CO2 27  --  27  GLUCOSE 88  --  356*  BUN 52*  --  59*  CREATININE 2.13*  --  1.94*  CALCIUM 9.3  --  9.2  MG  --  2.4  --    Liver Function Tests:  Recent Labs Lab 10/14/13 2230  AST 25  ALT 16  ALKPHOS 79  BILITOT 0.4  PROT 7.3  ALBUMIN 3.5   CBC:  Recent Labs Lab 10/14/13 1625 10/14/13 2230 10/16/13 0432  WBC 14.4*  --  14.2*  NEUTROABS  --  12.1* 12.3*  HGB 10.5*  --  9.3*  HCT 33.8*  --  30.8*  MCV 92.3  --  92.2  PLT 267  --  216   Cardiac Enzymes:  Recent Labs Lab 10/15/13 0426 10/15/13 0905 10/15/13 2132  TROPONINI <0.30 <0.30 <0.30   BNP:  Recent Labs Lab 10/14/13 1625  PROBNP 895.7*   CBG:  Recent Labs Lab 10/14/13 1622 10/14/13 2220 10/15/13 0641 10/15/13 1008 10/15/13 1605 10/15/13 2128  GLUCAP  98 194* 277* 304* 350* 364*   Urinalysis:  Recent Labs Lab 10/14/13 1725  COLORURINE YELLOW  LABSPEC 1.016  PHURINE 6.0  GLUCOSEU NEGATIVE  HGBUR NEGATIVE  BILIRUBINUR NEGATIVE  KETONESUR NEGATIVE  PROTEINUR 100*  UROBILINOGEN 0.2  NITRITE NEGATIVE  LEUKOCYTESUR NEGATIVE    Studies/Results: Dg Chest Port 1 View  10/14/2013   CLINICAL DATA:  80 year old male with shortness of breath.  EXAM: PORTABLE CHEST - 1 VIEW  COMPARISON:  03/03/2013  FINDINGS: Cardiomegaly and cardiac surgical changes again noted.  Mid right lung airspace opacity likely represents pneumonia or aspiration.  Mild peribronchial thickening is unchanged.  Elevation of the right hemidiaphragm is again noted.  There is no evidence of pneumothorax, large pleural effusion or acute bony abnormality.  IMPRESSION: Mid right lung airspace opacity likely representing pneumonia or aspiration.  Cardiomegaly.   Electronically Signed   By: Hassan Rowan M.D.   On: 10/14/2013 17:27   Medications: I have reviewed the patient's current medications. Scheduled Meds: . ampicillin-sulbactam (  UNASYN) IV  3 g Intravenous Q8H  . aspirin EC  325 mg Oral Daily  . azithromycin  500 mg Intravenous Q24H  . furosemide  40 mg Intravenous Q12H  . insulin aspart  0-9 Units Subcutaneous TID WC & HS  . insulin glargine  30 Units Subcutaneous BID  . ipratropium-albuterol  3 mL Nebulization TID  . methylPREDNISolone (SOLU-MEDROL) injection  60 mg Intravenous Once  . pantoprazole (PROTONIX) IV  40 mg Intravenous Q24H  . sodium chloride  3 mL Intravenous Q12H  . tiotropium  18 mcg Inhalation QHS   Continuous Infusions: none  PRN Meds:.sodium chloride, acetaminophen, nitroGLYCERIN, sodium chloride  Assessment/Plan: CAP vs. Aspiration PNA: Patient presented with increased shortness of breath x 4 days with cough productive of gray sputum. Tmax to 101.1 in ED. Leucocytosis to 14.4 but patient does not meet other SIRS criteria. CXR showed mid-right lung  airspace opacity, likely aspiration vs PNA. Unlikely PE given Well's score of 0. Patient uses 3L home O2 PRN.  Says his symptoms are improving this AM. He denies dyspnea, chest pain and breathing comfortably on RA.  He says he sometimes coughs with food but unclear if he is aspirating.   SLP swallow eval complete, no S&S of aspiration but recommends continue skilled ST briefly for diet tolerance to ensure safety - recommend regular diet/thin liquid; no follow-up recommended.  Flu negative, HIV negative.  -continue Unasyn and azithromycin  -sputum culture pending  -Legionella, Strep pneumo urinary antigens pending  -respiratory virus panel pending   COPD exacerbation: Patient presented with increased dyspnea, cough, and sputum production along with wheezing, consistent with COPD exacerbation.  PFTs in 2012 showed mild obstructive airway disease with moderately reduced diffusion capacity. Patient uses albuterol neb QID, Spiriva qhs at home as well as O2 prn. Of note, patient takes prednisone 5 mg daily at home for gout.  No wheezing today. -Solumedrol IV 60 mg today, Prednisone 40 with taper tomorrow -duonebs q4h  -continue home Spiriva  -RN to ambulate with pulse ox  CAD: s/p CABG x 2- Patient no chest pain at admission. slight ST depressions on EKG appear long-standing and slightly improved on AM EKG.  Cardiology was consulted and felt EKG at baseline.  CE neg x 3.  Chest pain last night, resolved, negative troponin.  EKG still with ST depressions look more pronounced.   - repeat EKG and re-cycle troponins.   - continue ASA, statin, NTG prn - resume imdur  CHF: pBNP relatively stable at 895, 820 in 02/2013. Little concern for acute exacerbation given diastolic dysfunction with no lower extremity swelling, no orthopnea or PND; furthermore, symptoms better explained by above. Patient takes Lasix 80 mg BID, metolazone prn weight gain at home. Of note, patient did received NS 1L bolus in ED.  -Lasix IV  40 mg BID  -holding metolazone for now   Uncontrolled DM2: A1C 9.9% in 08/2012, currently in 8s per wife. Patient takes Lantus 65 units BID, Novolog 20 units TID AC. Glucose on admission BMP 88. CBG 250-350s today.  -CBGs AC/hs  -continue Lantus to 30 units BID; will likely need to increase  -SSI-sensitive --> moderate  HTN: normotensive at admission but hypertensive today. Patient on amlodipine 10 mg daily, clonidine 0.1 mg BID at home.  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  -monitor BMP  History of atrial fibrillation: Currently in sinus rhythm.  -continue telemetry   Diet:  Heart/carb mod VTE ppx: SCDx, no pharmacologic VTE in the setting of heparin allergy Code status:  Full code  Dispo: Disposition is deferred at this time, awaiting improvement of current medical problems.  Anticipated discharge in approximately 2-3 day(s).   The patient does have a current PCP Redmond Pulling Arna Medici, MD) and does not need an [facility] hospital follow-up appointment after discharge.  The patient does not know have transportation limitations that hinder transportation to clinic appointments.  .Services Needed at time of discharge: Y = Yes, Blank = No PT:   OT:   RN:   Equipment:   Other:     LOS: 2 days   Duwaine Maxin, DO 10/16/2013, 8:07 AM

## 2013-10-16 NOTE — Progress Notes (Addendum)
ANTICOAGULATION CONSULT NOTE - Initial Consult  Pharmacy Consult for heparin Indication: chest pain/ACS  Allergies  Allergen Reactions  . Donepezil Diarrhea  . Heparin Rash and Other (See Comments)    Family stated it almost killed him. Hallucinations and itching.  Marland Kitchen Hydralazine Other (See Comments)    DO NOT TAKE PER MD  . Hydromorphone Itching  . Morphine Itching  . Plavix [Clopidogrel Bisulfate] Other (See Comments)    GI BLEEDING; "so bad I had to take blood; dr took me off it"  . Promethazine Other (See Comments)    respiratory failure  . Sulfonamide Derivatives Itching  . Cefuroxime Other (See Comments)     unkown reaction  . Metoprolol Other (See Comments)    unknown  . Ertapenem Itching and Rash    12/31/2011 pt does not recall allergy or severity    Patient Measurements: Height: 5\' 6"  (167.6 cm) Weight: 239 lb 8 oz (108.636 kg) (c scale) IBW/kg (Calculated) : 63.8 Heparin Dosing Weight: 88 kg  Vital Signs: Temp: 97.6 F (36.4 C) (05/25 2008) Temp src: Oral (05/25 2008) BP: 154/58 mmHg (05/25 2008) Pulse Rate: 79 (05/25 2008)  Labs:  Recent Labs  10/14/13 1625  10/15/13 0905 10/15/13 2132 10/16/13 0432 10/16/13 1837  HGB 10.5*  --   --   --  9.3*  --   HCT 33.8*  --   --   --  30.8*  --   PLT 267  --   --   --  216  --   CREATININE 2.13*  --   --   --  1.94*  --   TROPONINI  --   < > <0.30 <0.30  --  3.13*  < > = values in this interval not displayed.  Estimated Creatinine Clearance: 36.3 ml/min (by C-G formula based on Cr of 1.94).   Medical History: Past Medical History  Diagnosis Date  . Adenomatous colon polyp   . Diverticulosis   . GERD (gastroesophageal reflux disease)   . Hiatal hernia   . Alzheimer disease   . Gout     "hands; backbone; elbows; shoulders"  . Asthma with bronchitis   . COPD (chronic obstructive pulmonary disease)   . Chronic hypoxemic respiratory failure   . CHF (congestive heart failure)     grade 2 diastolic  dysfunction, EF 65-70% on ECHO 12/2011  . Blood transfusion     "I've had about 6 pints; related to a GI bleed; colon bleeding"   . Anemia   . GI bleed     GI [name],   . Hypertension   . Peripheral vascular disease     PAD  . Dysrhythmia     "skips"  . Anginal pain   . Myocardial infarction     "they said I had 2 light ones"  . Shortness of breath 12/31/2011    "all the time"  . Type II diabetes mellitus     with complications:  retinopathy, nephrophathy.   . Arthritis     "plenty"  . Chronic lower back pain   . Renal insufficiency     "went on dialysis probably 3 times" Last 04/2011  . Skin cancer     "forehead; arms"  . Depression   . Diverticulosis   . Colon polyps     adenomatous and hyperplastic  . GERD (gastroesophageal reflux disease)   . Hiatal hernia   . Memory deficits 01/11/2013  . Obesity   . Carotid bruit  bilateral  . CKD (chronic kidney disease) 03/07/2013  . Hemorrhoids   . AVM (arteriovenous malformation) of colon   . Gastric polyp   . Atrial fibrillation   . High cholesterol   . Pneumonia     "have had it several times"  . Chronic bronchitis   . OSA on CPAP   . On home oxygen therapy     "3L just at night" (10/16/2013)    Medications:  Prescriptions prior to admission  Medication Sig Dispense Refill  . acetaminophen (TYLENOL) 650 MG CR tablet Take 1,300 mg by mouth 2 (two) times daily.      Marland Kitchen albuterol (PROAIR HFA) 108 (90 BASE) MCG/ACT inhaler Inhale 2 puffs into the lungs daily as needed for wheezing or shortness of breath.      Marland Kitchen albuterol (PROVENTIL) (2.5 MG/3ML) 0.083% nebulizer solution Take 2.5 mg by nebulization 4 (four) times daily.      Marland Kitchen allopurinol (ZYLOPRIM) 300 MG tablet Take 300 mg by mouth 2 (two) times daily.      Marland Kitchen amLODipine (NORVASC) 10 MG tablet Take 10 mg by mouth daily.      Marland Kitchen aspirin EC 325 MG tablet Take 1 tablet (325 mg total) by mouth daily.  30 tablet  0  . atorvastatin (LIPITOR) 20 MG tablet Take 20 mg by mouth  daily.       . B-D UF III MINI PEN NEEDLES 31G X 5 MM MISC       . cloNIDine HCl (KAPVAY) 0.1 MG TB12 ER tablet Take 1 tablet (0.1 mg total) by mouth 2 (two) times daily.  60 tablet  5  . colchicine 0.6 MG tablet Take 0.6 mg by mouth 2 (two) times daily.      Marland Kitchen escitalopram (LEXAPRO) 20 MG tablet Take 20 mg by mouth daily.      Marland Kitchen ezetimibe (ZETIA) 10 MG tablet Take 10 mg by mouth daily.       . folic acid (FOLVITE) 109 MCG tablet Take 400 mcg by mouth daily.       . furosemide (LASIX) 80 MG tablet Take 1 tablet (80 mg total) by mouth 2 (two) times daily.  180 tablet  3  . gabapentin (NEURONTIN) 100 MG tablet Take 100 mg by mouth at bedtime.       . insulin aspart (NOVOLOG) 100 UNIT/ML injection Inject 20 Units into the skin 3 (three) times daily with meals.      . insulin glargine (LANTUS) 100 UNIT/ML injection Inject 64 Units into the skin 2 (two) times daily.       . isosorbide mononitrate (IMDUR) 60 MG 24 hr tablet Take 60 mg by mouth daily.      . metolazone (ZAROXOLYN) 2.5 MG tablet Take 2.5 mg by mouth See admin instructions. Take 30 minutes prior to morning furosemide dose for a weight gain of 3 lbs or more      . Multiple Vitamin (MULTIVITAMIN WITH MINERALS) TABS Take 0.5 tablets by mouth 2 (two) times daily.      . ONE TOUCH ULTRA TEST test strip       . pantoprazole (PROTONIX) 40 MG tablet Take 40 mg by mouth daily.       . predniSONE (DELTASONE) 5 MG tablet Take 5 mg by mouth daily.      Marland Kitchen tiotropium (SPIRIVA) 18 MCG inhalation capsule Place 18 mcg into inhaler and inhale at bedtime.      . nitroGLYCERIN (NITROSTAT) 0.4 MG SL tablet Place 0.4  mg under the tongue every 5 (five) minutes as needed for chest pain. May repeat up to 3 times        Assessment: Pt with history of CAD, CABG, here for pneumonia, also has chronic anemia and history of severe GI bleeding.  Pt complains of recurrent chest pain, now with (+) troponin.  Initiating heparin for rule-out ACS.  CBC is stable, no  bleeding noted.  Of note, patient is on aspirin 325.    Goal of Therapy:  Heparin level 0.3-0.7 units/ml Monitor platelets by anticoagulation protocol: Yes   Plan:  Pt has heparin allergy listed.  In the past he has received heparin bolus x1 and heparin gtt x1 - which appears to have been turned off after 1 hour.   F/u plan for anticoagulation, pending clarification from MD   Hughes Better, PharmD, [place] Pharmacist Pager: [phone] 10/16/2013 10:20 PM      11:49 PM Dr Haroldine Laws wishes to continue with heparin after being informed of  "drug reaction" if family refuses we will document this. Benefit outweighs risk    begin heparin with 4000 unit bolus then 1400 units/hr with 8 hour HL and will communicate with RN to monitor closely.   HL/cbc  at 0600 CBC and daily HL starting  5-27  Curlene Dolphin

## 2013-10-16 NOTE — Progress Notes (Addendum)
Internal Medicine On-Call Attending  Date: 10/16/2013  Patient name: [name] Medical record number: [id number] Date of birth: 05/09/1934 Age: 80 y.o. Gender: male  I saw and evaluated the patient. I discussed patient and reviewed the resident's note by Dr. Redmond Pulling, and I agree with the resident's findings and plans as documented in her note. Dr. Beryle Beams will take over as attending physician tomorrow 10/18/2011.

## 2013-10-16 NOTE — Progress Notes (Signed)
Pt places self on/off cpap.  RT will continue to monitor 

## 2013-10-16 NOTE — Progress Notes (Signed)
INTERNAL MEDICINE TEACHING SERVICE Night Float Progress Note   Subjective:    Saw patient at bedside this evening. Resting comfortably w/ CPAP. Denied any chest pain, SOB, dizziness, lightheadedness, or palpitations. Said he was feeling well.    Objective:    BP 154/58  Pulse 79  Temp(Src) 97.6 F (36.4 C) (Oral)  Resp 18  Ht 5\' 6"  (1.676 m)  Wt 239 lb 8 oz (108.636 kg)  BMI 38.67 kg/m2  SpO2 98%   Labs: Basic Metabolic Panel:    Component Value Date/Time   NA 139 10/16/2013 0432   NA 139 06/26/2013 0808   K 5.1 10/16/2013 0432   K 4.2 06/26/2013 0808   CL 100 10/16/2013 0432   CO2 27 10/16/2013 0432   CO2 25 06/26/2013 0808   BUN 59* 10/16/2013 0432   BUN 37.2* 06/26/2013 0808   CREATININE 1.94* 10/16/2013 0432   CREATININE 1.9* 06/26/2013 0808   GLUCOSE 356* 10/16/2013 0432   GLUCOSE 269* 06/26/2013 0808   CALCIUM 9.2 10/16/2013 0432   CALCIUM 9.1 06/26/2013 0808   CALCIUM 7.9* 08/09/2008 0350    CBC:    Component Value Date/Time   WBC 14.2* 10/16/2013 0432   WBC 10.1 06/26/2013 0808   HGB 9.3* 10/16/2013 0432   HGB 12.3* 06/26/2013 0808   HCT 30.8* 10/16/2013 0432   HCT 38.0* 06/26/2013 0808   PLT 216 10/16/2013 0432   PLT 178 06/26/2013 0808   MCV 92.2 10/16/2013 0432   MCV 91.6 06/26/2013 0808   NEUTROABS 12.3* 10/16/2013 0432   NEUTROABS 6.8* 06/26/2013 0808   LYMPHSABS 0.6* 10/16/2013 0432   LYMPHSABS 1.7 06/26/2013 0808   MONOABS 1.3* 10/16/2013 0432   MONOABS 0.7 06/26/2013 0808   EOSABS 0.0 10/16/2013 0432   EOSABS 0.8* 06/26/2013 0808   BASOSABS 0.0 10/16/2013 0432   BASOSABS 0.0 06/26/2013 0808    Cardiac Enzymes: Lab Results  Component Value Date   CKTOTAL 227 04/01/2012   CKMB 6.2* 04/01/2012   TROPONINI 3.13* 10/16/2013    Physical Exam: General: Vital signs reviewed and noted. Well-developed, well-nourished, in no acute distress; alert, appropriate and cooperative throughout examination. Using CPAP.   Lungs:  Clear to auscultation BL without crackles or wheezes.  Heart: RRR. S1  and S2 normal without gallop, murmur, or rubs.  Abdomen:  BS normoactive. Soft, Nondistended, non-tender.  No masses or organomegaly.  Extremities: No pretibial edema.     Assessment/ Plan:    Called around 8:00 PM for troponin positive of 3.13 this evening. Patient complained of 2/10 chest pain yesterday evening (~7:30 PM) per nursing report, thought to be reflux-type chest pain as per the patient. Repeat EKG last night w/ twi's in lead II, III, and aVF w/ ST depressions present in V2-V6. These findings were present on EKG from 10/14/13 and as far back as 02/2013, however, slightly more pronounced on EKG from PM of November 02, 2013. Troponin negative yesterday evening. Patient not having active chest pain currently, says he is feeling quite well. Seen by cardiology yesterday, signed off as patient was not having active cardiac issues at that time. Repeat EKG this evening shows NSR w/ similar twi's in II, III, and aVF, and mildly normalized/decreased ST depressions in V2-V6. -Discussed patient w/ Dr. Haroldine Laws, started Heparin gtt. Patient to have Myoview in the AM. -NPO at midnight -Continue to cycle troponins   Corky Sox, MD  10/16/2013, 10:02 PM

## 2013-10-16 NOTE — Progress Notes (Signed)
Inpatient Diabetes Program Recommendations  AACE/ADA: New Consensus Statement on Inpatient Glycemic Control (2013)  Target Ranges:  Prepandial:   less than 140 mg/dL      Peak postprandial:   less than 180 mg/dL (1-2 hours)      Critically ill patients:  140 - 180 mg/dL   Reason for Visit: Hyperglycemia  Diabetes history: DM2 Outpatient Diabetes medications: Novolog 20 units tid, Lantus 64 units bid Current orders for Inpatient glycemic control: Lantus 30 units bid and Novolog sensitive tidwc  Results for BAPTISTE, LITTLER (MRN [id number]) as of 10/16/2013 15:21  Ref. Range 10/15/2013 10:08 10/15/2013 16:05 10/15/2013 21:28 10/16/2013 07:09 10/16/2013 12:00  Glucose-Capillary Latest Range: 70-99 mg/dL 304 (H) 350 (H) 364 (H) 308 (H) 243 (H)    Inpatient Diabetes Program Recommendations Insulin - Basal: Increase Lantus to 40 units bid Insulin - Meal Coverage: Add Novolog 10 units tidwc for meal coverage insulin if pt eats >50% meal HgbA1C: HgbA1C to assess glycemic control prior to hospitalization  Note: Will follow. Thank you. Lorenda Peck, RD, LDN, CDE Inpatient Diabetes Coordinator [phone]

## 2013-10-17 ENCOUNTER — Inpatient Hospital Stay (HOSPITAL_COMMUNITY): Payer: BC Managed Care – PPO

## 2013-10-17 DIAGNOSIS — J189 Pneumonia, unspecified organism: Principal | ICD-10-CM

## 2013-10-17 LAB — CBC WITH DIFFERENTIAL/PLATELET
BASOS PCT: 0 % (ref 0–1)
Basophils Absolute: 0 10*3/uL (ref 0.0–0.1)
Eosinophils Absolute: 0 10*3/uL (ref 0.0–0.7)
Eosinophils Relative: 0 % (ref 0–5)
HCT: 30 % — ABNORMAL LOW (ref 39.0–52.0)
Hemoglobin: 9.1 g/dL — ABNORMAL LOW (ref 13.0–17.0)
Lymphocytes Relative: 4 % — ABNORMAL LOW (ref 12–46)
Lymphs Abs: 0.6 10*3/uL — ABNORMAL LOW (ref 0.7–4.0)
MCH: 27.7 pg (ref 26.0–34.0)
MCHC: 30.3 g/dL (ref 30.0–36.0)
MCV: 91.5 fL (ref 78.0–100.0)
MONO ABS: 1.4 10*3/uL — AB (ref 0.1–1.0)
Monocytes Relative: 10 % (ref 3–12)
NEUTROS ABS: 12.5 10*3/uL — AB (ref 1.7–7.7)
NEUTROS PCT: 86 % — AB (ref 43–77)
Platelets: 215 10*3/uL (ref 150–400)
RBC: 3.28 MIL/uL — ABNORMAL LOW (ref 4.22–5.81)
RDW: 17.7 % — AB (ref 11.5–15.5)
WBC: 14.5 10*3/uL — ABNORMAL HIGH (ref 4.0–10.5)

## 2013-10-17 LAB — GLUCOSE, CAPILLARY
GLUCOSE-CAPILLARY: 207 mg/dL — AB (ref 70–99)
GLUCOSE-CAPILLARY: 170 mg/dL — AB (ref 70–99)
GLUCOSE-CAPILLARY: 151 mg/dL — AB (ref 70–99)
Glucose-Capillary: 166 mg/dL — ABNORMAL HIGH (ref 70–99)

## 2013-10-17 LAB — BASIC METABOLIC PANEL
BUN: 63 mg/dL — ABNORMAL HIGH (ref 6–23)
CHLORIDE: 99 meq/L (ref 96–112)
CO2: 27 mEq/L (ref 19–32)
CREATININE: 1.7 mg/dL — AB (ref 0.50–1.35)
Calcium: 9.3 mg/dL (ref 8.4–10.5)
GFR calc non Af Amer: 37 mL/min — ABNORMAL LOW (ref >90)
GFR, EST AFRICAN AMERICAN: 43 mL/min — AB (ref >90)
Glucose, Bld: 281 mg/dL — ABNORMAL HIGH (ref 70–99)
POTASSIUM: 4.7 meq/L (ref 3.7–5.3)
Sodium: 140 mEq/L (ref 137–147)

## 2013-10-17 LAB — TROPONIN I
TROPONIN I: 2.85 ng/mL — AB (ref <0.30)
TROPONIN I: 2.86 ng/mL — AB (ref <0.30)
Troponin I: 1.97 ng/mL (ref <0.30)
Troponin I: 1.55 ng/mL (ref <0.30)

## 2013-10-17 LAB — STREP PNEUMONIAE URINARY ANTIGEN: STREP PNEUMO URINARY ANTIGEN: NEGATIVE

## 2013-10-17 LAB — APTT: aPTT: 28 seconds (ref 24–37)

## 2013-10-17 LAB — HEPARIN LEVEL (UNFRACTIONATED)
Heparin Unfractionated: <0.1 IU/mL — ABNORMAL LOW (ref 0.30–0.70)
Heparin Unfractionated: 0.1 IU/mL — ABNORMAL LOW (ref 0.30–0.70)

## 2013-10-17 LAB — LEGIONELLA ANTIGEN, URINE: Legionella Antigen, Urine: NEGATIVE

## 2013-10-17 LAB — PROTIME-INR
INR: 1.12 (ref 0.00–1.49)
Prothrombin Time: 14.2 seconds (ref 11.6–15.2)

## 2013-10-17 MED ORDER — CHLORHEXIDINE GLUCONATE CLOTH 2 % EX PADS
6.0000 | MEDICATED_PAD | Freq: Every day | CUTANEOUS | Status: DC
  Administered 2013-10-17 – 2013-10-19 (×2): 6 via TOPICAL

## 2013-10-17 MED ORDER — INSULIN GLARGINE 100 UNIT/ML ~~LOC~~ SOLN
35.0000 [IU] | Freq: Two times a day (BID) | SUBCUTANEOUS | Status: DC
  Administered 2013-10-17 – 2013-10-19 (×4): 35 [IU] via SUBCUTANEOUS
  Filled 2013-10-17 (×5): qty 0.35

## 2013-10-17 MED ORDER — TECHNETIUM TC 99M SESTAMIBI - CARDIOLITE
30.0000 | Freq: Once | INTRAVENOUS | Status: AC | PRN
  Administered 2013-10-17: 30 via INTRAVENOUS

## 2013-10-17 MED ORDER — REGADENOSON 0.4 MG/5ML IV SOLN
INTRAVENOUS | Status: AC
  Administered 2013-10-17: 0.4 mg
  Filled 2013-10-17: qty 5

## 2013-10-17 MED ORDER — REGADENOSON 0.4 MG/5ML IV SOLN
0.4000 mg | Freq: Once | INTRAVENOUS | Status: DC
  Filled 2013-10-17: qty 5

## 2013-10-17 MED ORDER — PREDNISONE 20 MG PO TABS
40.0000 mg | ORAL_TABLET | Freq: Every day | ORAL | Status: DC
  Filled 2013-10-17 (×2): qty 2

## 2013-10-17 MED ORDER — AZITHROMYCIN 500 MG PO TABS
500.0000 mg | ORAL_TABLET | Freq: Every day | ORAL | Status: DC
  Administered 2013-10-17 – 2013-10-18 (×2): 500 mg via ORAL
  Filled 2013-10-17 (×3): qty 1

## 2013-10-17 MED ORDER — GABAPENTIN 100 MG PO CAPS
100.0000 mg | ORAL_CAPSULE | Freq: Every day | ORAL | Status: DC
  Administered 2013-10-18 (×2): 100 mg via ORAL
  Filled 2013-10-17 (×4): qty 1

## 2013-10-17 MED ORDER — MUPIROCIN 2 % EX OINT
1.0000 "application " | TOPICAL_OINTMENT | Freq: Two times a day (BID) | CUTANEOUS | Status: DC
  Administered 2013-10-17 – 2013-10-19 (×6): 1 via NASAL
  Filled 2013-10-17: qty 22

## 2013-10-17 MED ORDER — FLUCONAZOLE 100 MG PO TABS
100.0000 mg | ORAL_TABLET | Freq: Every day | ORAL | Status: DC
  Administered 2013-10-18 – 2013-10-19 (×2): 100 mg via ORAL
  Filled 2013-10-17 (×2): qty 1

## 2013-10-17 MED ORDER — TECHNETIUM TC 99M SESTAMIBI GENERIC - CARDIOLITE
10.0000 | Freq: Once | INTRAVENOUS | Status: AC | PRN
  Administered 2013-10-17: 10 via INTRAVENOUS

## 2013-10-17 MED ORDER — FLUCONAZOLE 200 MG PO TABS
200.0000 mg | ORAL_TABLET | Freq: Once | ORAL | Status: AC
  Administered 2013-10-17: 200 mg via ORAL
  Filled 2013-10-17: qty 1

## 2013-10-17 MED ORDER — PREDNISONE 50 MG PO TABS
50.0000 mg | ORAL_TABLET | Freq: Once | ORAL | Status: AC
  Administered 2013-10-17: 50 mg via ORAL
  Filled 2013-10-17: qty 1

## 2013-10-17 NOTE — Progress Notes (Signed)
Patient Name: [name] Date of Encounter: 10/17/2013     Principal Problem:   CAP (community acquired pneumonia) Active Problems:   DM   Obstructive sleep apnea   HTN (hypertension)   HLD (hyperlipidemia)   Chronic renal insufficiency, stage III (moderate) - Baseline creatinine roughly 1.6   Diastolic dysfunction, - grade 2 by echo 01/01/12   CAD (coronary artery disease)   Paroxysmal atrial fibrillation   Acute-on-chronic respiratory failure   Nonspecific abnormal electrocardiogram (ECG) (EKG)   NSTEMI (non-ST elevated myocardial infarction)    SUBJECTIVE  Patient admitted with pneumonia.  Elevated cardiac enzymes consistent with non-STEMI.  The patient has chronic renal insufficiency. He denies any chest pain or shortness of breath.  He is scheduled for a lexiscan Myoview stress test today.  CURRENT MEDS . amLODipine  10 mg Oral Daily  . ampicillin-sulbactam (UNASYN) IV  3 g Intravenous Q8H  . aspirin EC  325 mg Oral Daily  . atorvastatin  20 mg Oral q1800  . azithromycin  500 mg Intravenous Q24H  . Chlorhexidine Gluconate Cloth  6 each Topical Q0600  . escitalopram  20 mg Oral Daily  . ezetimibe  10 mg Oral Daily  . furosemide  40 mg Intravenous Q12H  . insulin aspart  0-15 Units Subcutaneous TID WC  . insulin glargine  30 Units Subcutaneous BID  . ipratropium-albuterol  3 mL Nebulization TID  . isosorbide mononitrate  60 mg Oral Daily  . mupirocin ointment  1 application Nasal BID  . pantoprazole (PROTONIX) IV  40 mg Intravenous Q24H  . sodium chloride  3 mL Intravenous Q12H  . tiotropium  18 mcg Inhalation QHS    OBJECTIVE  Filed Vitals:   10/16/13 1930 10/16/13 1931 10/16/13 2008 10/17/13 0558  BP:   154/58 151/54  Pulse:   79 60  Temp:   97.6 F (36.4 C) 98.1 F (36.7 C)  TempSrc:   Oral Oral  Resp:   18 18  Height:      Weight:    236 lb 9.6 oz (107.321 kg)  SpO2: 97% 97% 98% 94%    Intake/Output Summary (Last 24 hours) at 10/17/13  0754 Last data filed at 10/17/13 0559  Gross per 24 hour  Intake    803 ml  Output   4275 ml  Net  -3472 ml   Filed Weights   10/15/13 0427 10/16/13 0654 10/17/13 0558  Weight: 242 lb 11.6 oz (110.1 kg) 239 lb 8 oz (108.636 kg) 236 lb 9.6 oz (107.321 kg)    PHYSICAL EXAM  General: Pleasant, NAD.  He is in no distress.  He is lying flat. Neuro: Alert and oriented X 3. Moves all extremities spontaneously. Psych: Normal affect. HEENT:  Normal  Neck: Supple without bruits or JVD. Lungs:  Minimal scattered rhonchi.  No expiratory wheezing. Heart: RRR no s3, s4, or murmurs. Abdomen: Soft, non-tender, non-distended, BS + x 4.  Extremities: No clubbing, cyanosis or edema. DP/PT/Radials 2+ and equal bilaterally.  Accessory Clinical Findings  CBC  Recent Labs  10/16/13 0432 10/17/13 0500  WBC 14.2* 14.5*  NEUTROABS 12.3* 12.5*  HGB 9.3* 9.1*  HCT 30.8* 30.0*  MCV 92.2 91.5  PLT 216 242   Basic Metabolic Panel  Recent Labs  10/14/13 2230 10/16/13 0432 10/17/13 0500  NA  --  139 140  K  --  5.1 4.7  CL  --  100 99  CO2  --  27 27  GLUCOSE  --  356* 281*  BUN  --  59* 63*  CREATININE  --  1.94* 1.70*  CALCIUM  --  9.2 9.3  MG 2.4  --   --    Liver Function Tests  Recent Labs  10/14/13 2230  AST 25  ALT 16  ALKPHOS 79  BILITOT 0.4  PROT 7.3  ALBUMIN 3.5   No results found for this basename: LIPASE, AMYLASE,  in the last 72 hours Cardiac Enzymes  Recent Labs  10/16/13 1837 10/16/13 2358 10/17/13 0500  TROPONINI 3.13* 2.86* 2.85*   BNP No components found with this basename: POCBNP,  D-Dimer No results found for this basename: DDIMER,  in the last 72 hours Hemoglobin A1C No results found for this basename: HGBA1C,  in the last 72 hours Fasting Lipid Panel No results found for this basename: CHOL, HDL, LDLCALC, TRIG, CHOLHDL, LDLDIRECT,  in the last 72 hours Thyroid Function Tests  Recent Labs  10/14/13 2230  TSH 3.100    TELE  Normal  sinus rhythm with PACs  ECG    Radiology/Studies  Dg Chest Port 1 View  10/14/2013   CLINICAL DATA:  80 year old male with shortness of breath.  EXAM: PORTABLE CHEST - 1 VIEW  COMPARISON:  03/03/2013  FINDINGS: Cardiomegaly and cardiac surgical changes again noted.  Mid right lung airspace opacity likely represents pneumonia or aspiration.  Mild peribronchial thickening is unchanged.  Elevation of the right hemidiaphragm is again noted.  There is no evidence of pneumothorax, large pleural effusion or acute bony abnormality.  IMPRESSION: Mid right lung airspace opacity likely representing pneumonia or aspiration.  Cardiomegaly.   Electronically Signed   By: Hassan Rowan M.D.   On: 10/14/2013 17:27    ASSESSMENT AND PLAN 1. NSTEMI  2. CAD s/p CABG  3. Stage IV chronic kidney disease  4. Pneumonia  5. Chronic diastolic heart failure compensated  Plan: Continue IV heparin.  Continue aspirin and statin.  Await results of Myoview.  If high risk will likely need cardiac catheterization.   Signed, Darlin Coco MD

## 2013-10-17 NOTE — Progress Notes (Signed)
Subjective/interval hx: No CP but elevated CE overnight, trending down.  Heparin gtt started per cardiology.  No CP at the time but says he has occasional palpitations.  Mr Grussing was seen and examined this AM.  He is NPO for myoview this AM.   He is in good spirits, denies chest pain or dyspnea.    Objective: Vital signs in last 24 hours: Filed Vitals:   10/16/13 1930 10/16/13 1931 10/16/13 2008 10/17/13 0558  BP:   154/58 151/54  Pulse:   79 60  Temp:   97.6 F (36.4 C) 98.1 F (36.7 C)  TempSrc:   Oral Oral  Resp:   18 18  Height:      Weight:    107.321 kg (236 lb 9.6 oz)  SpO2: 97% 97% 98% 94%   Weight change: -1.315 kg (-2 lb 14.4 oz)  Intake/Output Summary (Last 24 hours) at 10/17/13 0924 Last data filed at 10/17/13 0559  Gross per 24 hour  Intake    443 ml  Output   3575 ml  Net  -3132 ml   General: resting in bed in NAD HEENT: no gross abnormality Cardiac: RRR, no rubs, murmurs or gallops Pulm: mild crackles RLL, diffuse mild wheezing, moving normal volumes of air Abd: soft, nontender, nondistended, BS present Ext: warm and well perfused, no pedal edema Neuro: alert and oriented X3, responding appropriately  Lab Results: Basic Metabolic Panel:  Recent Labs Lab 10/14/13 2230 10/16/13 0432 10/17/13 0500  NA  --  139 140  K  --  5.1 4.7  CL  --  100 99  CO2  --  27 27  GLUCOSE  --  356* 281*  BUN  --  59* 63*  CREATININE  --  1.94* 1.70*  CALCIUM  --  9.2 9.3  MG 2.4  --   --    Liver Function Tests:  Recent Labs Lab 10/14/13 2230  AST 25  ALT 16  ALKPHOS 79  BILITOT 0.4  PROT 7.3  ALBUMIN 3.5   CBC:  Recent Labs Lab 10/16/13 0432 10/17/13 0500  WBC 14.2* 14.5*  NEUTROABS 12.3* 12.5*  HGB 9.3* 9.1*  HCT 30.8* 30.0*  MCV 92.2 91.5  PLT 216 215   Cardiac Enzymes:  Recent Labs Lab 10/16/13 1837 10/16/13 2358 10/17/13 0500  TROPONINI 3.13* 2.86* 2.85*   BNP:  Recent Labs Lab 10/14/13 1625  PROBNP 895.7*    CBG:  Recent Labs Lab 10/15/13 2128 10/16/13 0709 10/16/13 1200 10/16/13 1555 10/16/13 2151 10/17/13 0614  GLUCAP 364* 308* 243* 327* 411* 207*   Urinalysis:  Recent Labs Lab 10/14/13 1725  COLORURINE YELLOW  LABSPEC 1.016  PHURINE 6.0  GLUCOSEU NEGATIVE  HGBUR NEGATIVE  BILIRUBINUR NEGATIVE  KETONESUR NEGATIVE  PROTEINUR 100*  UROBILINOGEN 0.2  NITRITE NEGATIVE  LEUKOCYTESUR NEGATIVE    Studies/Results: No results found. Medications: I have reviewed the patient's current medications. Scheduled Meds: . amLODipine  10 mg Oral Daily  . ampicillin-sulbactam (UNASYN) IV  3 g Intravenous Q8H  . aspirin EC  325 mg Oral Daily  . atorvastatin  20 mg Oral q1800  . azithromycin  500 mg Oral QHS  . Chlorhexidine Gluconate Cloth  6 each Topical Q0600  . escitalopram  20 mg Oral Daily  . ezetimibe  10 mg Oral Daily  . furosemide  40 mg Intravenous Q12H  . insulin aspart  0-15 Units Subcutaneous TID WC  . insulin glargine  30 Units Subcutaneous BID  . ipratropium-albuterol  3 mL Nebulization TID  . isosorbide mononitrate  60 mg Oral Daily  . mupirocin ointment  1 application Nasal BID  . pantoprazole (PROTONIX) IV  40 mg Intravenous Q24H  . sodium chloride  3 mL Intravenous Q12H  . tiotropium  18 mcg Inhalation QHS   Continuous Infusions: . heparin 1,400 Units/hr (10/17/13 0036)  none  PRN Meds:.sodium chloride, acetaminophen, nitroGLYCERIN, sodium chloride  Assessment/Plan: CAP: Patient presented with increased shortness of breath x 4 days with cough productive of gray sputum. Tmax to 101.1 in ED. Leucocytosis to 14.4 but patient does not meet other SIRS criteria. CXR showed mid-right lung airspace opacity, likely aspiration vs PNA. Unlikely PE given Well's score of 0. Patient uses 3L home O2 PRN.  Says his symptoms are improving this AM. He denies dyspnea, chest pain and breathing comfortably on RA.  He says he sometimes coughs with food but unclear if he is  aspirating.   SLP swallow eval complete, no S&S of aspiration but recommends continue skilled ST briefly for diet tolerance to ensure safety - recommend regular diet/thin liquid; no follow-up recommended.  Flu negative, HIV negative, legionella and strep negative.  WBC stable around 14, likely influenced by steroid. -continue Unasyn and azithromycin  -sputum culture pending    NSTEMI w/ prior CAD hx: s/p CABG x 2- Patient had no chest pain at admission. Slight ST depressions on EKG appear long-standing and slightly improved on AM EKG.  Cardiology was consulted and felt EKG at baseline.  CE initially neg x 3.  Patient experienced CP on night of 05/24 (neg trop, slightly increased ST depression).  Subsequent trops on 05/25 3.13--> 2.86--> 2.85 --> 1.97.  Patient on heparin gtt. - myoview this AM; if high risk will need cath - continue heparin gtt - continue ASA, statin, imdur, NTG prn - no BB given COPD exac  Thrush:  HIV neg.  Likely 2/2 to uncontrolled DM. - fluconazole 200mg  today, then fluconazole 100mg  daily x 7-14 days  COPD exacerbation: Patient presented with increased dyspnea, cough, and sputum production along with wheezing, consistent with COPD exacerbation.  PFTs in 2012 showed mild obstructive airway disease with moderately reduced diffusion capacity. Patient uses albuterol neb QID, Spiriva qhs at home as well as O2 prn. Of note, patient takes prednisone 5 mg daily at home for gout.  Mild wheezing today. -Prednisone 50mg  today -duonebs TID -continue home Spiriva  -RN to ambulate with pulse ox  dHF (EF 65-70%, Aug 2013): pBNP relatively stable at 895; 820 in 02/2013. Little concern for acute exacerbation given diastolic dysfunction with no lower extremity swelling, no orthopnea or PND; furthermore, symptoms better explained by above. Patient takes Lasix 80 mg BID, metolazone prn weight gain at home. Of note, patient did received NS 1L bolus in ED.  Net neg 4.48L. -Lasix IV 40 mg BID   -holding metolazone for now   Uncontrolled DM2: A1C 9.9% in 08/2012, currently in 8s per wife. Patient takes Lantus 65 units BID, Novolog 20 units TID AC. Glucose on admission BMP 88. CBG 207 this AM while NPO for myoview.  -CBGs AC/hs  - increase Lantus 30 units BID --> 35 units BID once diet is resumed - continue SSI moderate  HTN: normotensive at admission but hypertensive today. Patient on amlodipine 10 mg daily, clonidine 0.1 mg BID at home.  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  -monitor BMP  History of atrial  fibrillation: Currently in sinus rhythm.  -continue telemetry   Diet:  NPO for myoview, resume heart/carb mod after  VTE ppx: SCDx, no pharmacologic VTE in the setting of heparin allergy Code status:  Full code  Dispo: Disposition is deferred at this time, awaiting improvement of current medical problems.  Anticipated discharge in approximately 2-3 day(s).   The patient does have a current PCP Redmond Pulling Arna Medici, MD) and does not need an [facility] hospital follow-up appointment after discharge.  The patient does not know have transportation limitations that hinder transportation to clinic appointments.  .Services Needed at time of discharge: Y = Yes, Blank = No PT:   OT:   RN:   Equipment:   Other:     LOS: 3 days   Duwaine Maxin, DO 10/17/2013, 9:24 AM

## 2013-10-17 NOTE — Care Management Note (Addendum)
  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 //Home with spouse.     Action/Plan:   -admit to IMTS telemetry  -continue Unasyn in addition to azithromycin  -sputum culture //Access for Home Health services.   Anticipated DC Date:  10/19/2013   Anticipated DC Plan:  [place]  CM consult      Choice offered to / List presented to:  C-1 Patient   DME arranged  [facility]      DME agency  [place].        Status of service:  Completed, signed off Medicare Important Message given?  YES (If response is "NO", the following Medicare IM given date fields will be blank) Date Medicare IM given:  10/18/2013 Date Additional Medicare IM given:    Discharge Disposition:  HOME/SELF CARE  Per UR Regulation:  Reviewed for med. necessity/level of care/duration of stay  If discussed at [place] of Stay Meetings, dates discussed:    Comments:  Triton Heidrich RN, BSN, MSHL, CCM  Nurse - Case Manager, ([facility] [place])  [phone]  10/19/2013 Social:  From home with wife SATS 86% on RA.  Patient has home oxygen with [employer]  but states he wishes to change to [facility] because he needs a portable tank to room for d/c and does not want to wait on delivery from [place].  Patient states he has wanted to change over to [facility] prior to this admission  but did  not understood how to process the change. CM provided education on process. CM ordered O2 with [facility]. Dispositon:  Home / Self care.

## 2013-10-17 NOTE — Progress Notes (Signed)
ANTICOAGULATION CONSULT NOTE - Follow Up Consult  Pharmacy Consult for Heparin  Indication: chest pain/ACS  Allergies  Allergen Reactions  . Donepezil Diarrhea  . Heparin Rash and Other (See Comments)    Family stated it almost killed him. Hallucinations and itching.  Marland Kitchen Hydralazine Other (See Comments)    DO NOT TAKE PER MD  . Hydromorphone Itching  . Morphine Itching  . Plavix [Clopidogrel Bisulfate] Other (See Comments)    GI BLEEDING; "so bad I had to take blood; dr took me off it"  . Promethazine Other (See Comments)    respiratory failure  . Sulfonamide Derivatives Itching  . Cefuroxime Other (See Comments)     unkown reaction  . Metoprolol Other (See Comments)    unknown  . Ertapenem Itching and Rash    12/31/2011 pt does not recall allergy or severity    Patient Measurements: Height: 5\' 6"  (167.6 cm) Weight: 236 lb 9.6 oz (107.321 kg) (c scale) IBW/kg (Calculated) : 63.8 Heparin Dosing Weight: 88 kg  Vital Signs: Temp: 99.1 F (37.3 C) (05/26 2039) Temp src: Oral (05/26 2039) BP: 124/54 mmHg (05/26 2039) Pulse Rate: 70 (05/26 2039)  Labs:  Recent Labs  10/16/13 0432  10/16/13 2358 10/17/13 0500 10/17/13 1300 10/17/13 1422 10/17/13 1720 10/17/13 2220  HGB 9.3*  --   --  9.1*  --   --   --   --   HCT 30.8*  --   --  30.0*  --   --   --   --   PLT 216  --   --  215  --   --   --   --   APTT  --   --  28  --   --   --   --   --   LABPROT  --   --  14.2  --   --   --   --   --   INR  --   --  1.12  --   --   --   --   --   HEPARINUNFRC  --   --   --   --   --  <0.10*  --  0.10*  CREATININE 1.94*  --   --  1.70*  --   --   --   --   TROPONINI  --   < > 2.86* 2.85* 1.97*  --  1.55*  --   < > = values in this interval not displayed.  Estimated Creatinine Clearance: 41.1 ml/min (by C-G formula based on Cr of 1.7).   Medications:  Heparin 1400 units/hr  Assessment: 80 y/o M on heparin for +troponin, s/p NST, noted h/o GIB, Hgb 9.1, HL is 0.1, other labs  as above.   Goal of Therapy:  Heparin level 0.3-0.7 units/ml Monitor platelets by anticoagulation protocol: Yes   Plan:  -Increase heparin to 1600 units/hr -0700 HL -Daily CBC/HL -Monitor for bleeding, trend Hgb  Narda Bonds 10/17/2013,10:57 PM

## 2013-10-17 NOTE — Progress Notes (Signed)
  Noted Steroid taper. CBG's still high on Solumedrol. Should improve as doses are tapered down.  Pt may still need meal coverage however as the moderate correction may not be enough to cover, esp if then ordered Prednisone.  Thank you, Rosita Kea, RN, CNS, Diabetes Coordinator [phone])

## 2013-10-17 NOTE — Progress Notes (Signed)
     Nuclear stress test complete.  Perry Mount PA-C  [facility]

## 2013-10-17 NOTE — Progress Notes (Signed)
Patient currently on home CPAP with FFM (home mask and tubing) with 3 lpm O2 bleed in.

## 2013-10-17 NOTE — Progress Notes (Signed)
[place] for heparin Indication: chest pain/ACS  Allergies  Allergen Reactions  . Donepezil Diarrhea  . Heparin Rash and Other (See Comments)    Family stated it almost killed him. Hallucinations and itching.  Marland Kitchen Hydralazine Other (See Comments)    DO NOT TAKE PER MD  . Hydromorphone Itching  . Morphine Itching  . Plavix [Clopidogrel Bisulfate] Other (See Comments)    GI BLEEDING; "so bad I had to take blood; dr took me off it"  . Promethazine Other (See Comments)    respiratory failure  . Sulfonamide Derivatives Itching  . Cefuroxime Other (See Comments)     unkown reaction  . Metoprolol Other (See Comments)    unknown  . Ertapenem Itching and Rash    12/31/2011 pt does not recall allergy or severity    Labs:  Recent Labs  10/14/13 1625  10/16/13 0432  10/16/13 2358 10/17/13 0500 10/17/13 1300 10/17/13 1422  HGB 10.5*  --  9.3*  --   --  9.1*  --   --   HCT 33.8*  --  30.8*  --   --  30.0*  --   --   PLT 267  --  216  --   --  215  --   --   APTT  --   --   --   --  28  --   --   --   LABPROT  --   --   --   --  14.2  --   --   --   INR  --   --   --   --  1.12  --   --   --   HEPARINUNFRC  --   --   --   --   --   --   --  <0.10*  CREATININE 2.13*  --  1.94*  --   --  1.70*  --   --   TROPONINI  --   < >  --   < > 2.86* 2.85* 1.97*  --   < > = values in this interval not displayed.  Estimated Creatinine Clearance: 41.1 ml/min (by C-G formula based on Cr of 1.7).  Assessment: Pt with history of CAD, CABG, here for pneumonia, also has chronic anemia and history of severe GI bleeding.  Pt complains of recurrent chest pain, now with (+) troponin.  Initiating heparin for rule-out ACS.  CBC is stable, no bleeding noted.  Of note, patient is on aspirin 325.   Heparin level < 0.10   Goal of Therapy:  Heparin level 0.3-0.7 units/ml Monitor platelets by anticoagulation protocol: Yes   Plan:  1) Heparin resumed at 1400 units / hr  (after test) 2) Heparin level at 2200 pm  Thank you. Anette Guarneri, PharmD [phone]

## 2013-10-17 NOTE — Progress Notes (Signed)
Patient ID: [name], male   DOB: 08-13-1933, 80 y.o.   MRN: [id number] Attending Physician Note: Patient interviewed and examined. Management plan discussed with resident physician Dr Duwaine Maxin. Prompt Cardiology input appreciated. No further episodes of chest pain. Oral candida on m exam today. 80 y/o with HTN, CAD,S/P prior CABG/MI, type 2 DM, paroxysmal A fib COPD, admitted with increased cough & dyspnea. Initial fever 101.1,  Patchy right pulmonary infiltrate on CXR.  He began Rx for pneumonia. He developed transient, stabbing chest pain radiating to back and arms. Subsequent rise in serum troponin.  Transient changes on EKG with ST depression in V3 - not reproducible. Heparin infusion started.  For cardiac myoview study today. Daughter here. Status reviewed.

## 2013-10-18 DIAGNOSIS — I5030 Unspecified diastolic (congestive) heart failure: Secondary | ICD-10-CM

## 2013-10-18 DIAGNOSIS — B370 Candidal stomatitis: Secondary | ICD-10-CM

## 2013-10-18 DIAGNOSIS — I5033 Acute on chronic diastolic (congestive) heart failure: Secondary | ICD-10-CM

## 2013-10-18 LAB — CBC
HCT: 30.9 % — ABNORMAL LOW (ref 39.0–52.0)
HEMOGLOBIN: 9.4 g/dL — AB (ref 13.0–17.0)
MCH: 28 pg (ref 26.0–34.0)
MCHC: 30.4 g/dL (ref 30.0–36.0)
MCV: 92 fL (ref 78.0–100.0)
Platelets: 171 10*3/uL (ref 150–400)
RBC: 3.36 MIL/uL — AB (ref 4.22–5.81)
RDW: 17.7 % — ABNORMAL HIGH (ref 11.5–15.5)
WBC: 10.4 10*3/uL (ref 4.0–10.5)

## 2013-10-18 LAB — BASIC METABOLIC PANEL
BUN: 56 mg/dL — ABNORMAL HIGH (ref 6–23)
CALCIUM: 9.4 mg/dL (ref 8.4–10.5)
CHLORIDE: 98 meq/L (ref 96–112)
CO2: 25 meq/L (ref 19–32)
Creatinine, Ser: 1.66 mg/dL — ABNORMAL HIGH (ref 0.50–1.35)
GFR calc Af Amer: 44 mL/min — ABNORMAL LOW (ref >90)
GFR calc non Af Amer: 38 mL/min — ABNORMAL LOW (ref >90)
GLUCOSE: 305 mg/dL — AB (ref 70–99)
Potassium: 4.8 mEq/L (ref 3.7–5.3)
SODIUM: 139 meq/L (ref 137–147)

## 2013-10-18 LAB — GLUCOSE, CAPILLARY
Glucose-Capillary: 273 mg/dL — ABNORMAL HIGH (ref 70–99)
Glucose-Capillary: 228 mg/dL — ABNORMAL HIGH (ref 70–99)
Glucose-Capillary: 177 mg/dL — ABNORMAL HIGH (ref 70–99)
Glucose-Capillary: 265 mg/dL — ABNORMAL HIGH (ref 70–99)

## 2013-10-18 MED ORDER — BIOTENE DRY MOUTH MT LIQD
15.0000 mL | Freq: Two times a day (BID) | OROMUCOSAL | Status: DC
  Administered 2013-10-18 – 2013-10-19 (×2): 15 mL via OROMUCOSAL

## 2013-10-18 MED ORDER — HEPARIN SODIUM (PORCINE) 5000 UNIT/ML IJ SOLN
5000.0000 [IU] | Freq: Three times a day (TID) | INTRAMUSCULAR | Status: DC
  Administered 2013-10-18 – 2013-10-19 (×4): 5000 [IU] via SUBCUTANEOUS
  Filled 2013-10-18 (×4): qty 1

## 2013-10-18 MED ORDER — FUROSEMIDE 80 MG PO TABS
80.0000 mg | ORAL_TABLET | Freq: Two times a day (BID) | ORAL | Status: DC
  Administered 2013-10-18 – 2013-10-19 (×3): 80 mg via ORAL
  Filled 2013-10-18 (×5): qty 1

## 2013-10-18 MED ORDER — AMOXICILLIN 500 MG PO CAPS
500.0000 mg | ORAL_CAPSULE | Freq: Three times a day (TID) | ORAL | Status: DC
  Administered 2013-10-18 – 2013-10-19 (×3): 500 mg via ORAL
  Filled 2013-10-18 (×5): qty 1

## 2013-10-18 MED ORDER — PREDNISONE 5 MG PO TABS
25.0000 mg | ORAL_TABLET | Freq: Once | ORAL | Status: AC
  Administered 2013-10-18: 25 mg via ORAL
  Filled 2013-10-18: qty 1

## 2013-10-18 NOTE — Progress Notes (Addendum)
Subjective/interval hx: No acute events overnight.  Says he feels the best he has felt since admission.  No chest pain.  Appetite good.  Normal bowel and bladder.  He tells me the cardiologist says he does not need a stent.  Objective: Vital signs in last 24 hours: Filed Vitals:   10/17/13 1343 10/17/13 2039 10/17/13 2146 10/18/13 0435  BP: 175/70 124/54  149/76  Pulse: 67 70  69  Temp: 98.7 F (37.1 C) 99.1 F (37.3 C)  98.3 F (36.8 C)  TempSrc: Oral Oral  Oral  Resp: 18 16  18   Height:      Weight:    105.915 kg (233 lb 8 oz)  SpO2: 97% 94% 95% 98%   Weight change: -1.406 kg (-3 lb 1.6 oz)  Intake/Output Summary (Last 24 hours) at 10/18/13 0753 Last data filed at 10/18/13 0557  Gross per 24 hour  Intake   1520 ml  Output   2250 ml  Net   -730 ml   General: sitting up on side of bed HEENT: no gross abnormality Cardiac: irregular rhythm, no rubs, murmurs or gallops Pulm: CTA B/L, moving normal volumes of air Abd: soft, nontender, nondistended, BS present Ext: warm and well perfused, no pedal edema Neuro: alert and oriented X3, responding appropriately  Lab Results: Basic Metabolic Panel:  Recent Labs Lab 10/14/13 2230  10/17/13 0500 10/18/13 0500  NA  --   < > 140 139  K  --   < > 4.7 4.8  CL  --   < > 99 98  CO2  --   < > 27 25  GLUCOSE  --   < > 281* 305*  BUN  --   < > 63* 56*  CREATININE  --   < > 1.70* 1.66*  CALCIUM  --   < > 9.3 9.4  MG 2.4  --   --   --   < > = values in this interval not displayed. Liver Function Tests:  Recent Labs Lab 10/14/13 2230  AST 25  ALT 16  ALKPHOS 79  BILITOT 0.4  PROT 7.3  ALBUMIN 3.5   CBC:  Recent Labs Lab 10/16/13 0432 10/17/13 0500 10/18/13 0500  WBC 14.2* 14.5* 10.4  NEUTROABS 12.3* 12.5*  --   HGB 9.3* 9.1* 9.4*  HCT 30.8* 30.0* 30.9*  MCV 92.2 91.5 92.0  PLT 216 215 171   Cardiac Enzymes:  Recent Labs Lab 10/17/13 0500 10/17/13 1300 10/17/13 1720  TROPONINI 2.85* 1.97* 1.55*    BNP:  Recent Labs Lab 10/14/13 1625  PROBNP 895.7*   CBG:  Recent Labs Lab 10/16/13 2151 10/17/13 0614 10/17/13 1436 10/17/13 1641 10/17/13 2110 10/18/13 0630  GLUCAP 411* 207* 170* 166* 151* 273*   Urinalysis:  Recent Labs Lab 10/14/13 1725  COLORURINE YELLOW  LABSPEC 1.016  PHURINE 6.0  GLUCOSEU NEGATIVE  HGBUR NEGATIVE  BILIRUBINUR NEGATIVE  KETONESUR NEGATIVE  PROTEINUR 100*  UROBILINOGEN 0.2  NITRITE NEGATIVE  LEUKOCYTESUR NEGATIVE    Studies/Results: Nm Myocar Multi W/spect W/wall Motion / Ef  10/17/2013   CLINICAL DATA:  Elevated cardiac enzymes.  EXAM: MYOCARDIAL IMAGING WITH SPECT (REST AND PHARMACOLOGIC-STRESS)  GATED LEFT VENTRICULAR WALL MOTION STUDY  LEFT VENTRICULAR EJECTION FRACTION  TECHNIQUE: Standard myocardial SPECT imaging performed after resting intravenous injection of 10 mCi Tc-42m sestimibi. Subsequently, intravenous infusion of regadenoson performed under the supervision of the Cardiology staff. At peak effect of the drug, 30 mCi Tc-61m sestimibi injected intravenously and  standard myocardial SPECT imaging performed. Quantitative gated imaging also performed to evaluate left ventricular wall motion and estimate left ventricular ejection fraction.  FINDINGS: MYOCARDIAL IMAGING WITH SPECT (REST AND PHARMACOLOGIC-STRESS)  The myocardial perfusion on the stress images are normal. There is no evidence for a fixed or reversible defect.  GATED LEFT VENTRICULAR WALL MOTION STUDY  Review of the gated images demonstrates some dyskinesia, particularly along the apex.  LEFT VENTRICULAR EJECTION FRACTION  QGS ejection fraction measures 46% , with an end-diastolic volume of 956 ml and an end-systolic volume of 68 ml.  IMPRESSION: No evidence for pharmacological induced reversibility or ischemia.  Calculated ejection fraction is 46%. There appears to be mild dyskinesia.   Electronically Signed   By: Markus Daft M.D.   On: 10/17/2013 15:03   Medications: I have  reviewed the patient's current medications. Scheduled Meds: . amLODipine  10 mg Oral Daily  . ampicillin-sulbactam (UNASYN) IV  3 g Intravenous Q8H  . aspirin EC  325 mg Oral Daily  . atorvastatin  20 mg Oral q1800  . azithromycin  500 mg Oral QHS  . Chlorhexidine Gluconate Cloth  6 each Topical Q0600  . escitalopram  20 mg Oral Daily  . ezetimibe  10 mg Oral Daily  . fluconazole  100 mg Oral Daily  . furosemide  40 mg Intravenous Q12H  . gabapentin  100 mg Oral QHS  . insulin aspart  0-15 Units Subcutaneous TID WC  . insulin glargine  35 Units Subcutaneous BID  . ipratropium-albuterol  3 mL Nebulization TID  . isosorbide mononitrate  60 mg Oral Daily  . mupirocin ointment  1 application Nasal BID  . pantoprazole (PROTONIX) IV  40 mg Intravenous Q24H  . regadenoson  0.4 mg Intravenous Once  . sodium chloride  3 mL Intravenous Q12H  . tiotropium  18 mcg Inhalation QHS   Continuous Infusions: . heparin 1,600 Units/hr (10/18/13 0000)  none  PRN Meds:.sodium chloride, acetaminophen, nitroGLYCERIN, sodium chloride  Assessment/Plan: CAP: Patient presented with increased shortness of breath x 4 days with cough productive of gray sputum. Tmax to 101.1 in ED. Leucocytosis to 14.4 but patient does not meet other SIRS criteria. CXR showed mid-right lung airspace opacity, likely aspiration vs PNA. Unlikely PE given Well's score of 0. Patient uses 3L home O2 PRN.  Says his symptoms are improving this AM. He denies dyspnea, chest pain and breathing comfortably on RA.  He says he sometimes coughs with food but unclear if he is aspirating.   SLP swallow eval complete, no S&S of aspiration but recommends continue skilled ST briefly for diet tolerance to ensure safety - recommend regular diet/thin liquid; no follow-up recommended.  Flu negative, HIV negative, legionella and strep negative.  WBC stable around 14, likely influenced by steroid.  No wheezing today, lungs clear. - continue below antibiotics  for 5 days - Unasyn --> amoxicillin 500 TID  - continue po azithromycin - sputum culture pending   - PT eval - CXR in 6 weeks - likely d/c home tomorrow  NSTEMI w/ prior CAD hx: s/p CABG x 2- Patient had no chest pain at admission. Slight ST depressions on EKG appear long-standing and slightly improved on AM EKG.  Cardiology was consulted and felt EKG at baseline.  CE initially neg x 3.  Patient experienced CP on night of 05/24 (neg trop, slightly increased ST depression).  Subsequent trops on 05/25 3.13--> 2.86--> 2.85 --> 1.97.  Patient started on heparin gtt.  No ischemia  on 10/17/13 Myoview.   No further CP - d/c heparin gtt - continue ASA, statin, imdur, NTG prn - no BB given COPD exac - d/c home with cardiology follow-up tomorrow  [place]:  HIV neg.  Likely 2/2 to uncontrolled DM. - fluconazole 200mg  today, then fluconazole 100mg  daily x 7-14 days  COPD exacerbation, mild: Patient presented with increased dyspnea, cough, and sputum production along with wheezing, consistent with COPD exacerbation.  PFTs in 2012 showed mild obstructive airway disease with moderately reduced diffusion capacity. Patient uses albuterol neb QID, Spiriva qhs at home as well as O2 prn. Of note, patient takes prednisone 5 mg daily at home for gout.  Mild wheezing today. -Prednisone 25mg  today -duonebs TID -continue home Spiriva  -RN to ambulate with pulse ox  dHF (EF 46%, Myoview 09/2013): pBNP relatively stable at 895; 820 in 02/2013. Little concern for acute exacerbation given diastolic dysfunction with no lower extremity swelling, no orthopnea or PND; furthermore, symptoms better explained by above. Patient takes Lasix 80 mg BID, metolazone prn weight gain at home. Of note, patient did received NS 1L bolus in ED.  Net neg 4.1L.  Worsening HF per myoview this admission (EF 46% vs EF 65% per myoview 09/2012)  -Lasix IV 40 mg BID --> Lasix 80 po BID -holding metolazone for now   Uncontrolled DM2: A1C 9.9% in  08/2012, currently in 8s per wife. Patient takes Lantus 65 units BID, Novolog 20 units TID AC. Glucose on admission BMP 88. CBG 207 this AM while NPO for myoview.  -CBGs AC/hs  - continue Lantus 35 units BID  - continue SSI moderate  HTN: Patient on amlodipine 10 mg daily, clonidine 0.1 mg BID at home.  - continue amlodipine; hold clonidine for now; resume if elevated pressures. - continue to monitor   CKD2-3- Creatinine stable at 1.66, baseline ~1.9-2.1.  -monitor BMP  History of atrial fibrillation: NSR at admission.  Back in Afib. -continue telemetry   Diet:  heart/carb mod after  VTE ppx: SCDs, [place] heparin Code status:  Full code  Dispo: Disposition is deferred at this time, awaiting improvement of current medical problems.  Anticipated discharge in approximately 2-3 day(s).   The patient does have a current PCP Redmond Pulling Arna Medici, MD) and does not need an [facility] hospital follow-up appointment after discharge.  The patient does not know have transportation limitations that hinder transportation to clinic appointments.  .Services Needed at time of discharge: Y = Yes, Blank = No PT:   OT:   RN:   Equipment:   Other:     LOS: 4 days   Duwaine Maxin, DO 10/18/2013, 7:53 AM Attending physician note: I personally examined this patient and discussed study results with him and his wife. Findings are accurate as recorded above by resident physician Dr. Duwaine Maxin. He had a cardiac Myoview study yesterday which does show a significant decrease in his ejection fraction compare with the study done just one year ago down from 60-65% to 45%. No areas of focal ischemia. He will followup with his cardiologist after discharge. With respect to his pneumonia, he will be transitioned to oral antibiotics today and discharge tomorrow if otherwise stable. Murriel Hopper, MD, [place]  Hematology-Oncology/Internal Medicine

## 2013-10-18 NOTE — Progress Notes (Signed)
Pt has home CPAP and says he needs no assistance in placing on tonight. Told to notify RT if needed otherwise.

## 2013-10-18 NOTE — Progress Notes (Signed)
Speech Language Pathology Treatment: Dysphagia  Patient Details Name: [name] MRN: [id number] DOB: 1934/01/26 Today's Date: 10/18/2013 Time: 3662-9476 SLP Time Calculation (min): 21 min  Assessment / Plan / Recommendation Clinical Impression  Pt reports no difficulty swallowing, and appropriate intake. Pt observed with graham cracker and thin liquids. No overt s/s aspiration observed or reported.  ST to sign off at this time. Please reconsult if needs arise.   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 BSE completed 10/15/13. ST following up for tolerance of recommended diet.   Pertinent Vitals VSS  SLP Plan  All goals met;Discharge SLP treatment due to (comment)    Recommendations Diet recommendations: Regular;Thin liquid Liquids provided via: Cup;Straw Medication Administration: Whole meds with liquid Supervision: Patient able to self feed Compensations: Slow rate;Small sips/bites Postural Changes and/or Swallow Maneuvers: Out of bed for meals;Seated upright 90 degrees              Oral Care Recommendations: Oral care Q4 per protocol Follow up Recommendations: None Plan: All goals met;Discharge SLP treatment due to (comment)    GO Celia B. Quentin Ore [facility], [place] [phone]  Lorre Nick 10/18/2013, 10:10 AM

## 2013-10-18 NOTE — Progress Notes (Signed)
Patient Name: [name] Date of Encounter: 10/18/2013     Principal Problem:   CAP (community acquired pneumonia) Active Problems:   DM   Obstructive sleep apnea   HTN (hypertension)   HLD (hyperlipidemia)   Chronic renal insufficiency, stage III (moderate) - Baseline creatinine roughly 1.6   Diastolic dysfunction, - grade 2 by echo 01/01/12   CAD (coronary artery disease)   Paroxysmal atrial fibrillation   Acute-on-chronic respiratory failure   Nonspecific abnormal electrocardiogram (ECG) (EKG)   NSTEMI (non-ST elevated myocardial infarction)    SUBJECTIVE  Patient feels well today.  No chest discomfort. His Myoview stress test yesterday showed no ischemia.  CURRENT MEDS . amLODipine  10 mg Oral Daily  . ampicillin-sulbactam (UNASYN) IV  3 g Intravenous Q8H  . aspirin EC  325 mg Oral Daily  . atorvastatin  20 mg Oral q1800  . azithromycin  500 mg Oral QHS  . Chlorhexidine Gluconate Cloth  6 each Topical Q0600  . escitalopram  20 mg Oral Daily  . ezetimibe  10 mg Oral Daily  . fluconazole  100 mg Oral Daily  . furosemide  40 mg Intravenous Q12H  . gabapentin  100 mg Oral QHS  . insulin aspart  0-15 Units Subcutaneous TID WC  . insulin glargine  35 Units Subcutaneous BID  . ipratropium-albuterol  3 mL Nebulization TID  . isosorbide mononitrate  60 mg Oral Daily  . mupirocin ointment  1 application Nasal BID  . pantoprazole (PROTONIX) IV  40 mg Intravenous Q24H  . regadenoson  0.4 mg Intravenous Once  . sodium chloride  3 mL Intravenous Q12H  . tiotropium  18 mcg Inhalation QHS    OBJECTIVE  Filed Vitals:   10/17/13 1343 10/17/13 2039 10/17/13 2146 10/18/13 0435  BP: 175/70 124/54  149/76  Pulse: 67 70  69  Temp: 98.7 F (37.1 C) 99.1 F (37.3 C)  98.3 F (36.8 C)  TempSrc: Oral Oral  Oral  Resp: 18 16  18   Height:      Weight:    233 lb 8 oz (105.915 kg)  SpO2: 97% 94% 95% 98%    Intake/Output Summary (Last 24 hours) at 10/18/13 0805 Last data  filed at 10/18/13 0557  Gross per 24 hour  Intake   1520 ml  Output   2250 ml  Net   -730 ml   Filed Weights   10/16/13 0654 10/17/13 0558 10/18/13 0435  Weight: 239 lb 8 oz (108.636 kg) 236 lb 9.6 oz (107.321 kg) 233 lb 8 oz (105.915 kg)    PHYSICAL EXAM General: Pleasant, NAD. He is in no distress. He is lying flat.  Neuro: Alert and oriented X 3. Moves all extremities spontaneously.  Psych: Normal affect.  HEENT: Normal  Neck: Supple without bruits or JVD.  Lungs: Minimal scattered rhonchi. No expiratory wheezing.  Heart: RRR no s3, s4, or murmurs.  Abdomen: Soft, non-tender, non-distended, BS + x 4.  Extremities: No clubbing, cyanosis or edema. DP/PT/Radials 2+ and equal bilaterally.    Accessory Clinical Findings  CBC  Recent Labs  10/16/13 0432 10/17/13 0500 10/18/13 0500  WBC 14.2* 14.5* 10.4  NEUTROABS 12.3* 12.5*  --   HGB 9.3* 9.1* 9.4*  HCT 30.8* 30.0* 30.9*  MCV 92.2 91.5 92.0  PLT 216 215 270   Basic Metabolic Panel  Recent Labs  10/17/13 0500 10/18/13 0500  NA 140 139  K 4.7 4.8  CL 99 98  CO2 27 25  GLUCOSE 281* 305*  BUN 63* 56*  CREATININE 1.70* 1.66*  CALCIUM 9.3 9.4   Liver Function Tests No results found for this basename: AST, ALT, ALKPHOS, BILITOT, PROT, ALBUMIN,  in the last 72 hours No results found for this basename: LIPASE, AMYLASE,  in the last 72 hours Cardiac Enzymes  Recent Labs  10/17/13 0500 10/17/13 1300 10/17/13 1720  TROPONINI 2.85* 1.97* 1.55*   BNP No components found with this basename: POCBNP,  D-Dimer No results found for this basename: DDIMER,  in the last 72 hours Hemoglobin A1C No results found for this basename: HGBA1C,  in the last 72 hours Fasting Lipid Panel No results found for this basename: CHOL, HDL, LDLCALC, TRIG, CHOLHDL, LDLDIRECT,  in the last 72 hours Thyroid Function Tests No results found for this basename: TSH, T4TOTAL, FREET3, T3FREE, THYROIDAB,  in the last 72  hours  TELE  Normal sinus rhythm with PACs  ECG  Normal sinus rhythm with PACs  Radiology/Studies  Nm Myocar Multi W/spect W/wall Motion / Ef  10/17/2013   CLINICAL DATA:  Elevated cardiac enzymes.  EXAM: MYOCARDIAL IMAGING WITH SPECT (REST AND PHARMACOLOGIC-STRESS)  GATED LEFT VENTRICULAR WALL MOTION STUDY  LEFT VENTRICULAR EJECTION FRACTION  TECHNIQUE: Standard myocardial SPECT imaging performed after resting intravenous injection of 10 mCi Tc-70m sestimibi. Subsequently, intravenous infusion of regadenoson performed under the supervision of the Cardiology staff. At peak effect of the drug, 30 mCi Tc-33m sestimibi injected intravenously and standard myocardial SPECT imaging performed. Quantitative gated imaging also performed to evaluate left ventricular wall motion and estimate left ventricular ejection fraction.  FINDINGS: MYOCARDIAL IMAGING WITH SPECT (REST AND PHARMACOLOGIC-STRESS)  The myocardial perfusion on the stress images are normal. There is no evidence for a fixed or reversible defect.  GATED LEFT VENTRICULAR WALL MOTION STUDY  Review of the gated images demonstrates some dyskinesia, particularly along the apex.  LEFT VENTRICULAR EJECTION FRACTION  QGS ejection fraction measures 46% , with an end-diastolic volume of 096 ml and an end-systolic volume of 68 ml.  IMPRESSION: No evidence for pharmacological induced reversibility or ischemia.  Calculated ejection fraction is 46%. There appears to be mild dyskinesia.   Electronically Signed   By: Markus Daft M.D.   On: 10/17/2013 15:03   Dg Chest Port 1 View  10/14/2013   CLINICAL DATA:  80 year old male with shortness of breath.  EXAM: PORTABLE CHEST - 1 VIEW  COMPARISON:  03/03/2013  FINDINGS: Cardiomegaly and cardiac surgical changes again noted.  Mid right lung airspace opacity likely represents pneumonia or aspiration.  Mild peribronchial thickening is unchanged.  Elevation of the right hemidiaphragm is again noted.  There is no evidence  of pneumothorax, large pleural effusion or acute bony abnormality.  IMPRESSION: Mid right lung airspace opacity likely representing pneumonia or aspiration.  Cardiomegaly.   Electronically Signed   By: Hassan Rowan M.D.   On: 10/14/2013 17:27    ASSESSMENT AND PLAN 1. NSTEMI  2. CAD s/p CABG  3. Stage IV chronic kidney disease  4. Pneumonia  5. Chronic diastolic heart failure compensated  Plan: Stop IV heparin.  Use medical dose heparin for DVT prophylaxis.  Continue aspirin.  Not a candidate for long-term anticoagulation because of previous bleeding problems. Resume home dose of oral Lasix.  Stop IV Lasix Anticipate home soon when pneumonia is under satisfactory control.  Signed, Darlin Coco MD

## 2013-10-19 DIAGNOSIS — I5032 Chronic diastolic (congestive) heart failure: Secondary | ICD-10-CM

## 2013-10-19 DIAGNOSIS — J449 Chronic obstructive pulmonary disease, unspecified: Secondary | ICD-10-CM

## 2013-10-19 LAB — CBC WITH DIFFERENTIAL/PLATELET
BASOS PCT: 0 % (ref 0–1)
Basophils Absolute: 0 10*3/uL (ref 0.0–0.1)
EOS ABS: 0 10*3/uL (ref 0.0–0.7)
EOS PCT: 0 % (ref 0–5)
HEMATOCRIT: 30.9 % — AB (ref 39.0–52.0)
HEMOGLOBIN: 9.4 g/dL — AB (ref 13.0–17.0)
LYMPHS ABS: 0.9 10*3/uL (ref 0.7–4.0)
Lymphocytes Relative: 10 % — ABNORMAL LOW (ref 12–46)
MCH: 27.5 pg (ref 26.0–34.0)
MCHC: 30.4 g/dL (ref 30.0–36.0)
MCV: 90.4 fL (ref 78.0–100.0)
MONO ABS: 0.8 10*3/uL (ref 0.1–1.0)
Monocytes Relative: 9 % (ref 3–12)
NEUTROS ABS: 7.6 10*3/uL (ref 1.7–7.7)
Neutrophils Relative %: 81 % — ABNORMAL HIGH (ref 43–77)
Platelets: 179 10*3/uL (ref 150–400)
RBC: 3.42 MIL/uL — AB (ref 4.22–5.81)
RDW: 17.3 % — ABNORMAL HIGH (ref 11.5–15.5)
WBC: 9.3 10*3/uL (ref 4.0–10.5)

## 2013-10-19 LAB — GLUCOSE, CAPILLARY
GLUCOSE-CAPILLARY: 196 mg/dL — AB (ref 70–99)
Glucose-Capillary: 220 mg/dL — ABNORMAL HIGH (ref 70–99)

## 2013-10-19 MED ORDER — PREDNISONE 2.5 MG PO TABS
12.5000 mg | ORAL_TABLET | Freq: Once | ORAL | Status: AC
  Administered 2013-10-19: 12.5 mg via ORAL
  Filled 2013-10-19: qty 1

## 2013-10-19 MED ORDER — FLUCONAZOLE 100 MG PO TABS: 100.0000 mg | ORAL_TABLET | Freq: Every day | ORAL | Status: DC

## 2013-10-19 MED ORDER — INSULIN GLARGINE 100 UNIT/ML ~~LOC~~ SOLN: 45.0000 [IU] | Freq: Two times a day (BID) | SUBCUTANEOUS | Status: DC

## 2013-10-19 MED ORDER — AZITHROMYCIN 500 MG PO TABS: 500.0000 mg | ORAL_TABLET | Freq: Every day | ORAL | Status: DC

## 2013-10-19 MED ORDER — AMOXICILLIN 500 MG PO CAPS: 500.0000 mg | ORAL_CAPSULE | Freq: Three times a day (TID) | ORAL | Status: DC

## 2013-10-19 NOTE — Discharge Summary (Signed)
Patient Name:  [name]  MRN: [id number]  PCP: [name], MD  DOB:  [birth year]       Date of Admission:  10/14/2013  Date of Discharge:  10/19/2013      Attending Physician: Dr. Annia Belt, MD         DISCHARGE DIAGNOSES: 1.   Community acquired pneumonia 2.   Non-ST elevated myocardial infarction 3.   Coronary artery disease  4.   Oropharyngeal candidiasis 5.   Chronic diastolic heart failure 6.   Chronic obstructive pulmonary disease 7.   Diabetes mellitus type 2, uncontrolled 8.   Paroxysmal atrial fibrillation 9.   Obstructive sleep apnea 10.   Hypertension 11.   Chronic kidney disease, stage 3     DISPOSITION AND FOLLOW-UP: [name] is to follow-up with the listed providers as detailed below, at patient's visiting, please address following issues:  1) Blood glucose control on lower insulin dose.  He will likely need to increase dose. 2) resolution of thrush 2) He will need follow-up CXR in 6 weeks.  Follow-up Information   Follow up with [name], MD On 10/23/2013. (at [place] for hospital follow-up)    Specialty:  [facility] Medicine   Contact information:   [place] [address] [phone]       Follow up with Sherren Mocha, MD On 11/08/2013. (at 9:30AM)    Specialty:  Cardiology   Contact information:   [address] [address] [phone]      Discharge Instructions   (HEART FAILURE PATIENTS) Call MD:  Anytime you have any of the following symptoms: 1) 3 pound weight gain in 24 hours or 5 pounds in 1 week 2) shortness of breath, with or without a dry hacking cough 3) swelling in the hands, feet or stomach 4) if you have to sleep on extra pillows at night in order to breathe.    Complete by:  As directed      Call MD for:  difficulty breathing, headache or visual disturbances    Complete by:  As directed      Call MD for:  extreme fatigue    Complete by:  As  directed      Call MD for:  hives    Complete by:  As directed      Call MD for:  persistant dizziness or light-headedness    Complete by:  As directed      Call MD for:  persistant nausea and vomiting    Complete by:  As directed      Call MD for:  severe uncontrolled pain    Complete by:  As directed      Call MD for:  temperature >100.4    Complete by:  As directed      Diet - low sodium heart healthy    Complete by:  As directed      Increase activity slowly    Complete by:  As directed             DISCHARGE MEDICATIONS:   Medication List         acetaminophen 650 MG CR tablet  Commonly known as:  TYLENOL  Take 1,300 mg by mouth 2 (two) times daily.     allopurinol 300 MG tablet  Commonly known as:  ZYLOPRIM  Take 300 mg by mouth 2 (two) times daily.     amLODipine 10 MG tablet  Commonly known as:  NORVASC  Take 10 mg by mouth daily.     amoxicillin 500 MG capsule  Commonly known as:  AMOXIL  Take 1 capsule (500 mg total) by mouth every 8 (eight) hours. Take your last dose tonight at 10PM.     aspirin EC 325 MG tablet  Take 1 tablet (325 mg total) by mouth daily.     atorvastatin 20 MG tablet  Commonly known as:  LIPITOR  Take 20 mg by mouth daily.     azithromycin 500 MG tablet  Commonly known as:  ZITHROMAX  Take 1 tablet (500 mg total) by mouth at bedtime. Take your last dose tonight at 10PM.     B-D UF III MINI PEN NEEDLES 31G X 5 MM Misc  Generic drug:  Insulin Pen Needle     cloNIDine HCl 0.1 MG Tb12 ER tablet  Commonly known as:  KAPVAY  Take 1 tablet (0.1 mg total) by mouth 2 (two) times daily.     colchicine 0.6 MG tablet  Take 0.6 mg by mouth 2 (two) times daily.     escitalopram 20 MG tablet  Commonly known as:  LEXAPRO  Take 20 mg by mouth daily.     ezetimibe 10 MG tablet  Commonly known as:  ZETIA  Take 10 mg by mouth daily.     fluconazole 100 MG tablet  Commonly known as:  DIFLUCAN  Take 1 tablet (100 mg total) by mouth  daily.     folic acid A999333 MCG tablet  Commonly known as:  FOLVITE  Take 400 mcg by mouth daily.     furosemide 80 MG tablet  Commonly known as:  LASIX  Take 1 tablet (80 mg total) by mouth 2 (two) times daily.     gabapentin 100 MG tablet  Commonly known as:  NEURONTIN  Take 100 mg by mouth at bedtime.     insulin aspart 100 UNIT/ML injection  Commonly known as:  novoLOG  Inject 20 Units into the skin 3 (three) times daily with meals.     insulin glargine 100 UNIT/ML injection  Commonly known as:  LANTUS  Inject 0.45 mLs (45 Units total) into the skin 2 (two) times daily.     isosorbide mononitrate 60 MG 24 hr tablet  Commonly known as:  IMDUR  Take 60 mg by mouth daily.     metolazone 2.5 MG tablet  Commonly known as:  ZAROXOLYN  Take 2.5 mg by mouth See admin instructions. Take 30 minutes prior to morning furosemide dose for a weight gain of 3 lbs or more     multivitamin with minerals Tabs tablet  Take 0.5 tablets by mouth 2 (two) times daily.     nitroGLYCERIN 0.4 MG SL tablet  Commonly known as:  NITROSTAT  Place 0.4 mg under the tongue every 5 (five) minutes as needed for chest pain. May repeat up to 3 times     ONE TOUCH ULTRA TEST test strip  Generic drug:  glucose blood     pantoprazole 40 MG tablet  Commonly known as:  PROTONIX  Take 40 mg by mouth daily.     predniSONE 5 MG tablet  Commonly known as:  DELTASONE  Take 5 mg by mouth daily.     PROAIR HFA 108 (90 BASE) MCG/ACT inhaler  Generic drug:  albuterol  Inhale 2 puffs into the lungs daily as needed for wheezing or shortness of breath.     albuterol (2.5 MG/3ML)  0.083% nebulizer solution  Commonly known as:  PROVENTIL  Take 2.5 mg by nebulization 4 (four) times daily.     tiotropium 18 MCG inhalation capsule  Commonly known as:  SPIRIVA  Place 18 mcg into inhaler and inhale at bedtime.         CONSULTS:  Cardiology    PROCEDURES PERFORMED:  Nm Myocar Multi W/spect W/wall Motion /  Ef  10/17/2013   CLINICAL DATA:  Elevated cardiac enzymes.  EXAM: MYOCARDIAL IMAGING WITH SPECT (REST AND PHARMACOLOGIC-STRESS)  GATED LEFT VENTRICULAR WALL MOTION STUDY  LEFT VENTRICULAR EJECTION FRACTION  TECHNIQUE: Standard myocardial SPECT imaging performed after resting intravenous injection of 10 mCi Tc-25m sestimibi. Subsequently, intravenous infusion of regadenoson performed under the supervision of the Cardiology staff. At peak effect of the drug, 30 mCi Tc-41m sestimibi injected intravenously and standard myocardial SPECT imaging performed. Quantitative gated imaging also performed to evaluate left ventricular wall motion and estimate left ventricular ejection fraction.  FINDINGS: MYOCARDIAL IMAGING WITH SPECT (REST AND PHARMACOLOGIC-STRESS)  The myocardial perfusion on the stress images are normal. There is no evidence for a fixed or reversible defect.  GATED LEFT VENTRICULAR WALL MOTION STUDY  Review of the gated images demonstrates some dyskinesia, particularly along the apex.  LEFT VENTRICULAR EJECTION FRACTION  QGS ejection fraction measures 46% , with an end-diastolic volume of 160 ml and an end-systolic volume of 68 ml.  IMPRESSION: No evidence for pharmacological induced reversibility or ischemia.  Calculated ejection fraction is 46%. There appears to be mild dyskinesia.   Electronically Signed   By: Markus Daft M.D.   On: 10/17/2013 15:03   Dg Chest Port 1 View  10/14/2013   CLINICAL DATA:  80 year old male with shortness of breath.  EXAM: PORTABLE CHEST - 1 VIEW  COMPARISON:  03/03/2013  FINDINGS: Cardiomegaly and cardiac surgical changes again noted.  Mid right lung airspace opacity likely represents pneumonia or aspiration.  Mild peribronchial thickening is unchanged.  Elevation of the right hemidiaphragm is again noted.  There is no evidence of pneumothorax, large pleural effusion or acute bony abnormality.  IMPRESSION: Mid right lung airspace opacity likely representing pneumonia or  aspiration.  Cardiomegaly.   Electronically Signed   By: Hassan Rowan M.D.   On: 10/14/2013 17:27      ADMISSION DATA: HPI: Mr. Garriga is a 80 year old man with history of HTN, DM2 (A1C 9.9% in 08/2012), mild COPD, CAD s/p CABG x 2 in 1997, dCHF (EF 65-70% with grade 2 diastolic dysfunction in 05/930), paroxysmal atrial fibrillation, CKD2-3, peripheral vascular disease, GERD, diverticulosis, gout, OSA on CPAP who presents with shortness of breath.  Patient states he has had worsening shortness of breath x 4 days. He has also been experiencing cough productive of grey sputum during that time and fever since this morning. He was seen by his PCP Dr. Arelia Sneddon on Thursday 5/21 who gave patient cough syrup and told him to call back if he felt worse. Patient's wife called back Friday 5/22, and patient was prescribed Z-pak but wife did not have time to pick it up until this morning so patient had only taken one dose. He came to ED today since he was still feeling poorly. He uses 3L home O2 PRN (intermittently for past few days) as well as CPAP at night. He denies coughing or choking when eating/drinking. Denies chest pain but reports intermittent palpitations; denies leg swelling, orthopnea, PND. Reports weakness but no syncope, also reports myalgias.  In ED, patient had  O2 sat 82% on arrival, Tmax 101.1, leucocytosis to 14. CXR showed right lung PNA thus patient started on Unasyn and duonebs. ABG also showed 7.44/43.4/61/29.  PCP Dr. Arelia Sneddon, pulmonology Dr. Annamaria Boots, cardiologist Dr. Burt Knack, nephrologist Dr. Baird Cancer  Physical Exam: Blood pressure 132/46, pulse 77, temperature 99.7 F (37.6 C), temperature source Oral, resp. rate 19, SpO2 92.00%.  General: alert, cooperative, and in no apparent distress, no difficulty speaking in full sentences HEENT: NCAT vision grossly intact, oropharynx clear and non-erythematous  Neck: supple, no lymphadenopathy Lungs: mildly increased work of respiration, diffuse crackles,  rhonchi in right mid-lung, minimal end-expiratory wheezing Heart: regular rate and rhythm, no murmurs, gallops, or rubs Abdomen: soft, non-tender, non-distended, normal bowel sounds  Extremities: 2+ DP/PT pulses bilaterally, no cyanosis, clubbing, or edema Neurologic: alert & oriented X3, cranial nerves II-XII intact, strength grossly intact, sensation intact to light touch  Labs: Basic Metabolic Panel:   Recent Labs   10/14/13 1625   NA  141   K  4.6   CL  98   CO2  27   GLUCOSE  88   BUN  52*   CREATININE  2.13*   CALCIUM  9.3    CBC:   Recent Labs   10/14/13 1625   WBC  14.4*   HGB  10.5*   HCT  33.8*   MCV  92.3   PLT  267    BNP:   Recent Labs   10/14/13 1625   PROBNP  895.7*    CBG:   Recent Labs   10/14/13 1622   GLUCAP  98    Urinalysis:   Recent Labs   10/14/13 1725   COLORURINE  YELLOW   LABSPEC  1.016   PHURINE  6.0   GLUCOSEU  NEGATIVE   HGBUR  NEGATIVE   BILIRUBINUR  NEGATIVE   KETONESUR  NEGATIVE   PROTEINUR  100*   UROBILINOGEN  0.2   NITRITE  NEGATIVE   [place] COURSE: Community acquired pneumonia:  Patient presented with symptoms of pneumonia and CXR showed mid-right lung airspace opacity.  Blood cultures were drawn and he was started on Vancomycin and Unasyn.  His symptoms were already beginning to improve on the morning after admission.  Because of the location of the pneumonia, SLP swallow evaluation was completed which showed no evidence of aspiration.  SLP recommended a regular diet with thin liquid.  His leukocytosis resolved, he remained afebrile and maintained oxygen saturations on 3L (his usual prn home dose).  As he improved, antibiotics were switched from IV to po to complete a 5 day course of treatment for community acquired pneumonia.  A hospital follow-up appointment has been scheduled with his PCP on 10/23/13.  He will need a follow-up CXR in 6 weeks to check for resolution of his pneumonia.     NSTEMI w/ prior CAD hx:  The patient has CAD, with history of CABG in 1997 and angioplasty with stenting in 2013.  He had no chest pain at admission. Slight ST depressions on initial EKG appeared to be long-standing and troponin was negative x 3.  Cardiology was consulted and felt EKG was at baseline.  The patient did experience chest pain on the night of 05/24.  EKG done at the time showed slightly more pronounced ST depressions, however troponin remained negative.  The patient had no further chest pain, however subsequent troponin the following day was elevated at 3.13 and trended down to 1.97.  Cardiology was re-consulted and the patient was started on a heparin drip for NSTEMI, likely due to demand ischemia.  The following day, per Cardiology recommendations, heparin drip was stopped and [place] heparin (for VTE prophylaxis) was resumed.  Myoview completed on 10/17/13 showed no ischemia, however it did reveal a worsening EF of 46% (compared to EF of 65% on Myoview 09/2012).  The patient will follow-up with Cardiology on 11/08/2013.   Oropharyngeal candidiasis:  The patient was HIV negative this admission and his thrush is likely secondary to uncontrolled DM coupled with chronic steroid use.  Fluconazole was started at admission and should be continued for a total of 14 days.  COPD:  IV Solu-Medrol was started at admission given his initial symptoms.  He had only mild wheezing during admission with no wheezing in the days leading up to discharge. I am not convinced this was a COPD exacerbation, if so it was very mild.  Because he takes steroid chronically for gout, steroids were continued with quick taper during admission.  He was discharged on his home dose of 5mg  daily.  Home medications were resumed at discharge.  Chronic diastolic heart failure:  His pBNP was relatively stable at 895; 820 in 02/2013.  There was little concern for acute exacerbation given diastolic dysfunction with no lower extremity  edema, no orthopnea or PND; furthermore, his symptoms were better explained by pneumonia.  He takes Lasix 80 mg BID and metolazone prn for weight gain at home.  Lasix was continued during admission (initially IV then home po dose resumed).  He was net negative 4L by day of discharge.  Myoview this admission revealed EF 46% compared to EF 65% per [facility] 09/2012.  Home medications were resumed at discharge.   Uncontrolled DM type 2:  His HgbA1c was 9.9% 08/2012.  Home medications are Lantus 65 units BID and Novolog 20 units TID AC.  We started with Lantus 30 units BID initially and titrated up based on CBGs.  He was discharged on Lantus 45 units BID and will likely require increase to his home dose when he follows-up with his PCP.  He was instructed to check his CBGs several times per day over the next few days and to take his meter to follow-up appointment on 06/01 with PCP.    DISCHARGE DATA: Vital Signs: BP 143/55  Pulse 80  Temp(Src) 98.3 F (36.8 C) (Oral)  Resp 16  Ht 5\' 6"  (1.676 m)  Wt 104.327 kg (230 lb)  BMI 37.14 kg/m2  SpO2 87%  Labs: Results for orders placed during the hospital encounter of 10/14/13 (from the past 24 hour(s))  GLUCOSE, CAPILLARY     Status: Abnormal   Collection Time    10/18/13  4:29 PM      Result Value Ref Range   Glucose-Capillary 177 (*) 70 - 99 mg/dL   Comment 1 Notify RN    GLUCOSE, CAPILLARY     Status: Abnormal   Collection Time    10/18/13  8:59 PM      Result Value Ref Range   Glucose-Capillary 265 (*) 70 - 99 mg/dL   Comment 1 Notify RN     Comment 2 Documented in Chart    CBC WITH DIFFERENTIAL     Status: Abnormal   Collection Time    10/19/13  5:14 AM      Result Value Ref Range   WBC 9.3  4.0 - 10.5 K/uL   RBC 3.42 (*) 4.22 - 5.81 MIL/uL  Hemoglobin 9.4 (*) 13.0 - 17.0 g/dL   HCT 30.9 (*) 39.0 - 52.0 %   MCV 90.4  78.0 - 100.0 fL   MCH 27.5  26.0 - 34.0 pg   MCHC 30.4  30.0 - 36.0 g/dL   RDW 17.3 (*) 11.5 - 15.5 %   Platelets  179  150 - 400 K/uL   Neutrophils Relative % 81 (*) 43 - 77 %   Lymphocytes Relative 10 (*) 12 - 46 %   Monocytes Relative 9  3 - 12 %   Eosinophils Relative 0  0 - 5 %   Basophils Relative 0  0 - 1 %   Neutro Abs 7.6  1.7 - 7.7 K/uL   Lymphs Abs 0.9  0.7 - 4.0 K/uL   Monocytes Absolute 0.8  0.1 - 1.0 K/uL   Eosinophils Absolute 0.0  0.0 - 0.7 K/uL   Basophils Absolute 0.0  0.0 - 0.1 K/uL   WBC Morphology MILD LEFT SHIFT (1-5% METAS, OCC MYELO, OCC BANDS)    GLUCOSE, CAPILLARY     Status: Abnormal   Collection Time    10/19/13  5:51 AM      Result Value Ref Range   Glucose-Capillary 196 (*) 70 - 99 mg/dL  GLUCOSE, CAPILLARY     Status: Abnormal   Collection Time    10/19/13 11:04 AM      Result Value Ref Range   Glucose-Capillary 220 (*) 70 - 99 mg/dL   Comment 1 Notify RN       Services Ordered on Discharge: Y = Yes; Blank = No PT:   OT:   RN:   Equipment:   Other:      Time Spent on Discharge: 35 min   Signed: Duwaine Maxin PGY 1, Internal Medicine Resident 10/19/2013, 2:49 PM  Attending Physician Discharge Note: I personally examined this patient on the day of discharge and attest to the accuracy of the evaluation and discharge plan as recorded by resident physician Dr. Duwaine Maxin  Murriel Hopper, MD, [place]  Hematology-Oncology/Internal Medicine

## 2013-10-19 NOTE — Progress Notes (Addendum)
Subjective/interval hx: No acute events overnight.  Patient seen and examined this AM.  Feels well, no complaints and ready for discharge home.   Objective: Vital signs in last 24 hours: Filed Vitals:   10/18/13 2153 10/19/13 0601 10/19/13 0929 10/19/13 1019  BP:  151/78  151/48  Pulse:  90  79  Temp:  97.9 F (36.6 C)    TempSrc:  Oral    Resp:  20    Height:      Weight:  104.327 kg (230 lb)    SpO2: 100% 94% 96%    Weight change: -1.588 kg (-3 lb 8 oz)  Intake/Output Summary (Last 24 hours) at 10/19/13 1054 Last data filed at 10/19/13 1026  Gross per 24 hour  Intake    580 ml  Output   1700 ml  Net  -1120 ml   General: resting in bed in NAD HEENT: no gross abnormality Cardiac: irregular rhythm, no rubs, murmurs or gallops Pulm: CTA B/L, moving normal volumes of air, SpO2 mid 90s on RA Abd: soft, nontender, nondistended, BS present Ext: warm and well perfused, no pedal edema Neuro: alert and oriented X3, responding appropriately  Lab Results: Basic Metabolic Panel:  Recent Labs Lab 10/14/13 2230  10/17/13 0500 10/18/13 0500  NA  --   < > 140 139  K  --   < > 4.7 4.8  CL  --   < > 99 98  CO2  --   < > 27 25  GLUCOSE  --   < > 281* 305*  BUN  --   < > 63* 56*  CREATININE  --   < > 1.70* 1.66*  CALCIUM  --   < > 9.3 9.4  MG 2.4  --   --   --   < > = values in this interval not displayed. Liver Function Tests:  Recent Labs Lab 10/14/13 2230  AST 25  ALT 16  ALKPHOS 79  BILITOT 0.4  PROT 7.3  ALBUMIN 3.5   CBC:  Recent Labs Lab 10/17/13 0500 10/18/13 0500 10/19/13 0514  WBC 14.5* 10.4 9.3  NEUTROABS 12.5*  --  7.6  HGB 9.1* 9.4* 9.4*  HCT 30.0* 30.9* 30.9*  MCV 91.5 92.0 90.4  PLT 215 171 179   Cardiac Enzymes:  Recent Labs Lab 10/17/13 0500 10/17/13 1300 10/17/13 1720  TROPONINI 2.85* 1.97* 1.55*   BNP:  Recent Labs Lab 10/14/13 1625  PROBNP 895.7*   CBG:  Recent Labs Lab 10/17/13 2110 10/18/13 0630 10/18/13 1135  10/18/13 1629 10/18/13 2059 10/19/13 0551  GLUCAP 151* 273* 228* 177* 265* 196*   Urinalysis:  Recent Labs Lab 10/14/13 1725  COLORURINE YELLOW  LABSPEC 1.016  PHURINE 6.0  GLUCOSEU NEGATIVE  HGBUR NEGATIVE  BILIRUBINUR NEGATIVE  KETONESUR NEGATIVE  PROTEINUR 100*  UROBILINOGEN 0.2  NITRITE NEGATIVE  LEUKOCYTESUR NEGATIVE    Studies/Results: Nm Myocar Multi W/spect W/wall Motion / Ef  10/17/2013   CLINICAL DATA:  Elevated cardiac enzymes.  EXAM: MYOCARDIAL IMAGING WITH SPECT (REST AND PHARMACOLOGIC-STRESS)  GATED LEFT VENTRICULAR WALL MOTION STUDY  LEFT VENTRICULAR EJECTION FRACTION  TECHNIQUE: Standard myocardial SPECT imaging performed after resting intravenous injection of 10 mCi Tc-42m sestimibi. Subsequently, intravenous infusion of regadenoson performed under the supervision of the Cardiology staff. At peak effect of the drug, 30 mCi Tc-47m sestimibi injected intravenously and standard myocardial SPECT imaging performed. Quantitative gated imaging also performed to evaluate left ventricular wall motion and estimate left ventricular ejection fraction.  FINDINGS: MYOCARDIAL IMAGING WITH SPECT (REST AND PHARMACOLOGIC-STRESS)  The myocardial perfusion on the stress images are normal. There is no evidence for a fixed or reversible defect.  GATED LEFT VENTRICULAR WALL MOTION STUDY  Review of the gated images demonstrates some dyskinesia, particularly along the apex.  LEFT VENTRICULAR EJECTION FRACTION  QGS ejection fraction measures 46% , with an end-diastolic volume of 269 ml and an end-systolic volume of 68 ml.  IMPRESSION: No evidence for pharmacological induced reversibility or ischemia.  Calculated ejection fraction is 46%. There appears to be mild dyskinesia.   Electronically Signed   By: Markus Daft M.D.   On: 10/17/2013 15:03   Medications: I have reviewed the patient's current medications. Scheduled Meds: . amLODipine  10 mg Oral Daily  . amoxicillin  500 mg Oral 3 times per  day  . antiseptic oral rinse  15 mL Mouth Rinse BID  . aspirin EC  325 mg Oral Daily  . atorvastatin  20 mg Oral q1800  . azithromycin  500 mg Oral QHS  . Chlorhexidine Gluconate Cloth  6 each Topical Q0600  . escitalopram  20 mg Oral Daily  . ezetimibe  10 mg Oral Daily  . fluconazole  100 mg Oral Daily  . furosemide  80 mg Oral BID  . gabapentin  100 mg Oral QHS  . heparin subcutaneous  5,000 Units Subcutaneous 3 times per day  . insulin aspart  0-15 Units Subcutaneous TID WC  . insulin glargine  35 Units Subcutaneous BID  . ipratropium-albuterol  3 mL Nebulization TID  . isosorbide mononitrate  60 mg Oral Daily  . mupirocin ointment  1 application Nasal BID  . pantoprazole (PROTONIX) IV  40 mg Intravenous Q24H  . predniSONE  12.5 mg Oral Once  . regadenoson  0.4 mg Intravenous Once  . sodium chloride  3 mL Intravenous Q12H  . tiotropium  18 mcg Inhalation QHS   Continuous Infusions:  none  PRN Meds:.sodium chloride, acetaminophen, nitroGLYCERIN, sodium chloride  Assessment/Plan: CAP: Patient presented with increased shortness of breath x 4 days with cough productive of gray sputum. Tmax to 101.1 in ED. Leucocytosis to 14.4 but patient does not meet other SIRS criteria. CXR showed mid-right lung airspace opacity, likely aspiration vs PNA. Unlikely PE given Well's score of 0. Patient uses 3L home O2 PRN.  Says his symptoms are improving this AM. He denies dyspnea, chest pain and breathing comfortably on RA.  He says he sometimes coughs with food but unclear if he is aspirating.   SLP swallow eval complete, no S&S of aspiration but recommends continue skilled ST briefly for diet tolerance to ensure safety - recommend regular diet/thin liquid; no follow-up recommended.  Flu negative, HIV negative, legionella and strep negative.  No wheezing today, lungs clear.  WBC improved 14.5 >>> 9.3, afebrile. - continue amoxicillin 500 TID and azithromycin qd through today (5 day total CAP trx) -  sputum culture pending   - CXR in 6 weeks - d/c home today - outpatient follow-up scheduled with PCP next week (06/01)  NSTEMI w/ prior CAD hx: s/p CABG x 2- Patient had no chest pain at admission. Slight ST depressions on EKG appear long-standing and slightly improved on AM EKG.  Cardiology was consulted and felt EKG at baseline.  CE initially neg x 3.  Patient experienced CP on night of 05/24 (neg trop, slightly increased ST depression).  Subsequent trops on 05/25 3.13--> 2.86--> 2.85 --> 1.97.  Patient started on heparin gtt.  No ischemia on 10/17/13 Myoview.   No further CP. - continue ASA, statin, imdur, NTG prn - d/c home with cardiology follow-up - he already has an appt on 06/17  Thrush:  HIV neg.  Likely 2/2 to uncontrolled DM. - continue fluconazole 100mg  daily x 7-14 days   COPD: Patient presented with increased dyspnea, cough, and sputum production along with wheezing, consistent with COPD exacerbation.  Patient had mild wheezing during admission with no wheezing in recent days.  I am not convinced this was a COPD exacerbation, if so it was very mild. -Prednisone 12.5mg  today; resume home dose of Prednisone 5mg  daily (for gout) at d/c - continue home bronchodilators at d/c  dHF (EF 46%, Myoview 09/2013): pBNP relatively stable at 895; 820 in 02/2013. Little concern for acute exacerbation given diastolic dysfunction with no lower extremity swelling, no orthopnea or PND; furthermore, symptoms better explained by above. Patient takes Lasix 80 mg BID, metolazone prn weight gain at home. Of note, patient did received NS 1L bolus in ED.  Net neg 4.1L.  Worsening HF per myoview this admission (EF 46% vs EF 65% per myoview 09/2012)  - continue home dose of Lasix 80mg  po BID at d/c - resume prn metolazone at d/c  Uncontrolled DM type 2: A1C 9.9% in 08/2012, currently in 8s per wife. Patient takes Lantus 65 units BID, Novolog 20 units TID AC. Glucose on admission BMP 88. CBG 207 this AM while  NPO for myoview.  CBGs 150-270s in the past 24 hours. - increase Lantus to 45 units BID at d/c.  Will not increase to full home dose since he has only been on 35 units BID this admission.  Will defer to PCP to increase as needed. - patient has been instructed to check CBGs several times per day over the next few days and to take his meter to follow-up appointment on 06/01 with PCP.  HTN: Patient on amlodipine 10 mg daily, clonidine 0.1 mg BID at home.  - continue home medications at d/c  CKD2-3- Creatinine stable at 1.66, baseline ~1.9-2.1.  -monitor BMP  History of atrial fibrillation: NSR at admission.  Back in Afib. - continue ASA at d/c  Diet:  heart/carb mod  VTE ppx: SCDs, [place] heparin Code status:  Full code  Dispo: He is stable for d/c home with follow-up with PCP and Cardiology in 1 week.  The patient does have a current PCP Redmond Pulling Arna Medici, MD) and does not need an [facility] hospital follow-up appointment after discharge.  The patient does not know have transportation limitations that hinder transportation to clinic appointments.  .Services Needed at time of discharge: Y = Yes, Blank = No PT:   OT:   RN:   Equipment:   Other:     LOS: 5 days   Duwaine Maxin, DO 10/19/2013, 10:54 AM Attending physician note: I personally interviewed and examined this patient today. I concur with physical findings and management planned as recorded above by resident physician Dr. Duwaine Maxin. The patient is stable for discharge today. Murriel Hopper, MD, [facility]ology-Oncology/Internal Medicine

## 2013-10-19 NOTE — Progress Notes (Addendum)
DC IV, DC Tele, DC Home. Discharge instructions and home medications discussed with patient and patient's wife. Patient and patient's wife denied any questions or concerns at this time. Patient leaving unit via wheelchair with O2 and appears in no acute distress.

## 2013-10-19 NOTE — Progress Notes (Signed)
PT Cancellation Note  Patient Details Name: [name] MRN: [id number] DOB: 01-Feb-1934   Cancelled Treatment:    Reason Eval Not Completed: PT screened, no needs identified, will sign off  Wife present. Spoke with them re: his home set-up, equipment, and mobility PTA. Pt has been limited for some time and they both feel he has all the equipment he needs for home use. They are interested in an electric scooter for community use, however have been told insurance will not pay for one. Pt ambulated with nursing earlier to assess home O2 needs and both pt and wife report he is near his baseline and are not concerned re: mobility.    Jeanie Cooks Audrionna Lampton 10/19/2013, 1:50 PM Pager [phone]

## 2013-10-19 NOTE — Progress Notes (Signed)
SATURATION QUALIFICATIONS: (This note is used to comply with regulatory documentation for home oxygen)  Patient Saturations on Room Air at Rest = 87%  Patient Saturations on 4 Liters of oxygen while Ambulating = 97%

## 2013-10-19 NOTE — Progress Notes (Signed)
Patient Name: [name] Date of Encounter: 10/19/2013     Principal Problem:   CAP (community acquired pneumonia) Active Problems:   DM   Obstructive sleep apnea   HTN (hypertension)   HLD (hyperlipidemia)   Chronic renal insufficiency, stage III (moderate) - Baseline creatinine roughly 1.6   Diastolic dysfunction, - grade 2 by echo 01/01/12   CAD (coronary artery disease)   Paroxysmal atrial fibrillation   Acute-on-chronic respiratory failure   Nonspecific abnormal electrocardiogram (ECG) (EKG)   NSTEMI (non-ST elevated myocardial infarction)    SUBJECTIVE  Denies chest pain or shortness of breath.  Lying flat in bed without dyspnea.  Telemetry stable normal sinus rhythm with PACs and PVCs.  CURRENT MEDS . amLODipine  10 mg Oral Daily  . amoxicillin  500 mg Oral 3 times per day  . antiseptic oral rinse  15 mL Mouth Rinse BID  . aspirin EC  325 mg Oral Daily  . atorvastatin  20 mg Oral q1800  . azithromycin  500 mg Oral QHS  . Chlorhexidine Gluconate Cloth  6 each Topical Q0600  . escitalopram  20 mg Oral Daily  . ezetimibe  10 mg Oral Daily  . fluconazole  100 mg Oral Daily  . furosemide  80 mg Oral BID  . gabapentin  100 mg Oral QHS  . heparin subcutaneous  5,000 Units Subcutaneous 3 times per day  . insulin aspart  0-15 Units Subcutaneous TID WC  . insulin glargine  35 Units Subcutaneous BID  . ipratropium-albuterol  3 mL Nebulization TID  . isosorbide mononitrate  60 mg Oral Daily  . mupirocin ointment  1 application Nasal BID  . pantoprazole (PROTONIX) IV  40 mg Intravenous Q24H  . regadenoson  0.4 mg Intravenous Once  . sodium chloride  3 mL Intravenous Q12H  . tiotropium  18 mcg Inhalation QHS    OBJECTIVE  Filed Vitals:   10/18/13 1428 10/18/13 2116 10/18/13 2153 10/19/13 0601  BP: 163/58 135/75  151/78  Pulse: 62 87  90  Temp: 98 F (36.7 C) 97.9 F (36.6 C)  97.9 F (36.6 C)  TempSrc: Oral Oral  Oral  Resp: 20 20  20   Height:        Weight:    230 lb (104.327 kg)  SpO2: 97% 97% 100% 94%    Intake/Output Summary (Last 24 hours) at 10/19/13 0919 Last data filed at 10/19/13 0600  Gross per 24 hour  Intake    340 ml  Output   1700 ml  Net  -1360 ml   Filed Weights   10/17/13 0558 10/18/13 0435 10/19/13 0601  Weight: 236 lb 9.6 oz (107.321 kg) 233 lb 8 oz (105.915 kg) 230 lb (104.327 kg)    PHYSICAL EXAM  General: Pleasant, NAD. Neuro: Alert and oriented X 3. Moves all extremities spontaneously. Psych: Normal affect. HEENT:  Normal  Neck: Supple without bruits or JVD. Lungs:  Resp regular and unlabored, CTA. Heart: RRR no s3, s4, or murmurs. Abdomen: Soft, non-tender, non-distended, BS + x 4.  Extremities: No clubbing, cyanosis or edema. DP/PT/Radials 2+ and equal bilaterally.  Accessory Clinical Findings  CBC  Recent Labs  10/17/13 0500 10/18/13 0500 10/19/13 0514  WBC 14.5* 10.4 9.3  NEUTROABS 12.5*  --  7.6  HGB 9.1* 9.4* 9.4*  HCT 30.0* 30.9* 30.9*  MCV 91.5 92.0 90.4  PLT 215 171 616   Basic Metabolic Panel  Recent Labs  10/17/13 0500 10/18/13 0500  NA 140  139  K 4.7 4.8  CL 99 98  CO2 27 25  GLUCOSE 281* 305*  BUN 63* 56*  CREATININE 1.70* 1.66*  CALCIUM 9.3 9.4   Liver Function Tests No results found for this basename: AST, ALT, ALKPHOS, BILITOT, PROT, ALBUMIN,  in the last 72 hours No results found for this basename: LIPASE, AMYLASE,  in the last 72 hours Cardiac Enzymes  Recent Labs  10/17/13 0500 10/17/13 1300 10/17/13 1720  TROPONINI 2.85* 1.97* 1.55*   BNP No components found with this basename: POCBNP,  D-Dimer No results found for this basename: DDIMER,  in the last 72 hours Hemoglobin A1C No results found for this basename: HGBA1C,  in the last 72 hours Fasting Lipid Panel No results found for this basename: CHOL, HDL, LDLCALC, TRIG, CHOLHDL, LDLDIRECT,  in the last 72 hours Thyroid Function Tests No results found for this basename: TSH, T4TOTAL, FREET3,  T3FREE, THYROIDAB,  in the last 72 hours  TELE  Normal sinus rhythm with PACs  ECG    Radiology/Studies  Nm Myocar Multi W/spect W/wall Motion / Ef  10/17/2013   CLINICAL DATA:  Elevated cardiac enzymes.  EXAM: MYOCARDIAL IMAGING WITH SPECT (REST AND PHARMACOLOGIC-STRESS)  GATED LEFT VENTRICULAR WALL MOTION STUDY  LEFT VENTRICULAR EJECTION FRACTION  TECHNIQUE: Standard myocardial SPECT imaging performed after resting intravenous injection of 10 mCi Tc-20m sestimibi. Subsequently, intravenous infusion of regadenoson performed under the supervision of the Cardiology staff. At peak effect of the drug, 30 mCi Tc-23m sestimibi injected intravenously and standard myocardial SPECT imaging performed. Quantitative gated imaging also performed to evaluate left ventricular wall motion and estimate left ventricular ejection fraction.  FINDINGS: MYOCARDIAL IMAGING WITH SPECT (REST AND PHARMACOLOGIC-STRESS)  The myocardial perfusion on the stress images are normal. There is no evidence for a fixed or reversible defect.  GATED LEFT VENTRICULAR WALL MOTION STUDY  Review of the gated images demonstrates some dyskinesia, particularly along the apex.  LEFT VENTRICULAR EJECTION FRACTION  QGS ejection fraction measures 46% , with an end-diastolic volume of 161 ml and an end-systolic volume of 68 ml.  IMPRESSION: No evidence for pharmacological induced reversibility or ischemia.  Calculated ejection fraction is 46%. There appears to be mild dyskinesia.   Electronically Signed   By: Markus Daft M.D.   On: 10/17/2013 15:03   Dg Chest Port 1 View  10/14/2013   CLINICAL DATA:  80 year old male with shortness of breath.  EXAM: PORTABLE CHEST - 1 VIEW  COMPARISON:  03/03/2013  FINDINGS: Cardiomegaly and cardiac surgical changes again noted.  Mid right lung airspace opacity likely represents pneumonia or aspiration.  Mild peribronchial thickening is unchanged.  Elevation of the right hemidiaphragm is again noted.  There is no  evidence of pneumothorax, large pleural effusion or acute bony abnormality.  IMPRESSION: Mid right lung airspace opacity likely representing pneumonia or aspiration.  Cardiomegaly.   Electronically Signed   By: Hassan Rowan M.D.   On: 10/14/2013 17:27    ASSESSMENT AND PLAN 1. NSTEMI  2. CAD s/p CABG  3. Stage IV chronic kidney disease  4. Pneumonia  5. Chronic diastolic heart failure compensated  Plan: Continue Lasix 80 mg twice a day which is his home dose. Okay for discharge home soon from cardiac standpoint  Signed, Darlin Coco MD

## 2013-10-19 NOTE — Discharge Instructions (Addendum)
1. You have hospital follow up appointments as follows:     Dr. Stephens November on Monday 06/01 at [place].     Dr. Burt Knack at [facility] on 06/17 at 9:30AM  2. You need to wear your oxygen all of the time until you can follow-up with Dr. Stephens November.  If your oxygen saturation is okay without oxygen when you see Dr. Stephens November you may be able to return to using it as needed and at night.  However, if not you may need to see the lung doctor.  3. Please take all medications as prescribed.  I have decreased your insulin dose to 45 units twice per day.  Please check your blood sugar 2-3 times per day over the next few days and take your meter/logs to Dr. Stephens November on Monday.  If your blood sugar is too high he will need to increase your insulin.    4. If you have worsening of your symptoms or new symptoms arise, please call the clinic ([phone]), or go to the ER immediately if symptoms are severe.

## 2013-10-21 LAB — CULTURE, BLOOD (ROUTINE X 2)
CULTURE: NO GROWTH
Culture: NO GROWTH

## 2013-11-06 ENCOUNTER — Telehealth: Payer: Self-pay | Admitting: *Deleted

## 2013-11-06 ENCOUNTER — Other Ambulatory Visit: Payer: Self-pay | Admitting: Hematology and Oncology

## 2013-11-06 ENCOUNTER — Telehealth: Payer: Self-pay | Admitting: Hematology and Oncology

## 2013-11-06 DIAGNOSIS — K922 Gastrointestinal hemorrhage, unspecified: Secondary | ICD-10-CM

## 2013-11-06 NOTE — Telephone Encounter (Signed)
I can see her this Thursday at 1015 am with labs I will put order for labs

## 2013-11-06 NOTE — Telephone Encounter (Signed)
, °

## 2013-11-06 NOTE — Telephone Encounter (Signed)
Wife left VM states pt has been more anemic lately w/ Hgb less than 10.  She asks for pt to see Dr. [name] again.  Called back and s/w pt himself. He reports "I stay tired."  States he was instructed to take oral iron but he can't tolerate it.  He would like to see Dr. [name] again to see if anything else will help.

## 2013-11-06 NOTE — Telephone Encounter (Signed)
Informed pt of appt on This Thursday at 9:45 am for lab and then 10:15 am to see Dr. [name].  He verbalized understanding. POF sent to scheduler.

## 2013-11-08 ENCOUNTER — Ambulatory Visit (INDEPENDENT_AMBULATORY_CARE_PROVIDER_SITE_OTHER): Payer: BC Managed Care – PPO | Admitting: Cardiovascular Disease

## 2013-11-08 ENCOUNTER — Encounter: Payer: Self-pay | Admitting: Cardiovascular Disease

## 2013-11-08 VITALS — BP 136/59 | HR 72 | Ht 66.0 in | Wt 235.4 lb

## 2013-11-08 DIAGNOSIS — I209 Angina pectoris, unspecified: Secondary | ICD-10-CM

## 2013-11-08 DIAGNOSIS — I25118 Atherosclerotic heart disease of native coronary artery with other forms of angina pectoris: Secondary | ICD-10-CM

## 2013-11-08 DIAGNOSIS — I509 Heart failure, unspecified: Secondary | ICD-10-CM

## 2013-11-08 DIAGNOSIS — I5032 Chronic diastolic (congestive) heart failure: Secondary | ICD-10-CM

## 2013-11-08 DIAGNOSIS — I2510 Atherosclerotic heart disease of native coronary artery without angina pectoris: Secondary | ICD-10-CM

## 2013-11-08 NOTE — Patient Instructions (Signed)
Your physician has requested that you have an echocardiogram. Echocardiography is a painless test that uses sound waves to create images of your heart. It provides your doctor with information about the size and shape of your heart and how well your heart's chambers and valves are working. This procedure takes approximately one hour. There are no restrictions for this procedure.  Your physician recommends that you continue on your current medications as directed. Please refer to the Current Medication list given to you today.  Your physician wants you to follow-up in: 4 MONTHS with Dr Burt Knack.  You will receive a reminder letter in the mail two months in advance. If you don't receive a letter, please call our office to schedule the follow-up appointment.

## 2013-11-09 ENCOUNTER — Encounter: Payer: Self-pay | Admitting: Hematology and Oncology

## 2013-11-09 ENCOUNTER — Telehealth: Payer: Self-pay | Admitting: Hematology and Oncology

## 2013-11-09 ENCOUNTER — Encounter: Payer: Self-pay | Admitting: Cardiovascular Disease

## 2013-11-09 ENCOUNTER — Other Ambulatory Visit (HOSPITAL_BASED_OUTPATIENT_CLINIC_OR_DEPARTMENT_OTHER): Payer: BC Managed Care – PPO

## 2013-11-09 ENCOUNTER — Ambulatory Visit (HOSPITAL_BASED_OUTPATIENT_CLINIC_OR_DEPARTMENT_OTHER): Payer: BC Managed Care – PPO | Admitting: Hematology and Oncology

## 2013-11-09 VITALS — BP 130/41 | HR 71 | Temp 97.7°F | Resp 18 | Ht 66.0 in | Wt 240.0 lb

## 2013-11-09 DIAGNOSIS — K31811 Angiodysplasia of stomach and duodenum with bleeding: Secondary | ICD-10-CM

## 2013-11-09 DIAGNOSIS — K922 Gastrointestinal hemorrhage, unspecified: Secondary | ICD-10-CM

## 2013-11-09 DIAGNOSIS — D500 Iron deficiency anemia secondary to blood loss (chronic): Secondary | ICD-10-CM

## 2013-11-09 DIAGNOSIS — R5381 Other malaise: Secondary | ICD-10-CM

## 2013-11-09 DIAGNOSIS — R0602 Shortness of breath: Secondary | ICD-10-CM

## 2013-11-09 DIAGNOSIS — R5383 Other fatigue: Secondary | ICD-10-CM

## 2013-11-09 LAB — COMPREHENSIVE METABOLIC PANEL (CC13)
ALBUMIN: 3.3 g/dL — AB (ref 3.5–5.0)
ALT: 24 U/L (ref 0–55)
AST: 25 U/L (ref 5–34)
Alkaline Phosphatase: 78 U/L (ref 40–150)
Anion Gap: 12 mEq/L — ABNORMAL HIGH (ref 3–11)
BUN: 45.9 mg/dL — ABNORMAL HIGH (ref 7.0–26.0)
CALCIUM: 9.3 mg/dL (ref 8.4–10.4)
CHLORIDE: 102 meq/L (ref 98–109)
CO2: 26 meq/L (ref 22–29)
Creatinine: 2.3 mg/dL — ABNORMAL HIGH (ref 0.7–1.3)
GLUCOSE: 267 mg/dL — AB (ref 70–140)
Potassium: 4 mEq/L (ref 3.5–5.1)
Sodium: 140 mEq/L (ref 136–145)
Total Bilirubin: 0.56 mg/dL (ref 0.20–1.20)
Total Protein: 6.8 g/dL (ref 6.4–8.3)

## 2013-11-09 LAB — CBC & DIFF AND RETIC
BASO%: 0.2 % (ref 0.0–2.0)
BASOS ABS: 0 10*3/uL (ref 0.0–0.1)
EOS%: 4.7 % (ref 0.0–7.0)
Eosinophils Absolute: 0.4 10*3/uL (ref 0.0–0.5)
HEMATOCRIT: 27.8 % — AB (ref 38.4–49.9)
HGB: 8.2 g/dL — ABNORMAL LOW (ref 13.0–17.1)
Immature Retic Fract: 11.1 % — ABNORMAL HIGH (ref 3.00–10.60)
LYMPH%: 11.8 % — ABNORMAL LOW (ref 14.0–49.0)
MCH: 26 pg — ABNORMAL LOW (ref 27.2–33.4)
MCHC: 29.5 g/dL — AB (ref 32.0–36.0)
MCV: 88.3 fL (ref 79.3–98.0)
MONO#: 0.8 10*3/uL (ref 0.1–0.9)
MONO%: 8.6 % (ref 0.0–14.0)
NEUT#: 6.8 10*3/uL — ABNORMAL HIGH (ref 1.5–6.5)
NEUT%: 74.7 % (ref 39.0–75.0)
PLATELETS: 212 10*3/uL (ref 140–400)
RBC: 3.15 10*6/uL — ABNORMAL LOW (ref 4.20–5.82)
RDW: 17.9 % — ABNORMAL HIGH (ref 11.0–14.6)
Retic %: 3.57 % — ABNORMAL HIGH (ref 0.80–1.80)
Retic Ct Abs: 112.46 10*3/uL — ABNORMAL HIGH (ref 34.80–93.90)
WBC: 9.1 10*3/uL (ref 4.0–10.3)
lymph#: 1.1 10*3/uL (ref 0.9–3.3)

## 2013-11-09 LAB — FERRITIN CHCC: Ferritin: 28 ng/ml (ref 22–316)

## 2013-11-09 LAB — IRON AND TIBC CHCC
%SAT: 6 % — ABNORMAL LOW (ref 20–55)
Iron: 20 ug/dL — ABNORMAL LOW (ref 42–163)
TIBC: 321 ug/dL (ref 202–409)
UIBC: 302 ug/dL (ref 117–376)

## 2013-11-09 LAB — LACTATE DEHYDROGENASE (CC13): LDH: 226 U/L (ref 125–245)

## 2013-11-09 LAB — TECHNOLOGIST REVIEW

## 2013-11-09 NOTE — Progress Notes (Signed)
HPI:  80 year old gentleman presenting for followup evaluation. He has extensive cardiovascular disease. The patient had CABG in 1997. He's undergone multiple PCI procedures. He's had contrast-induced nephropathy in the past requiring short-term hemodialysis. He also has peripheral arterial disease and congestive heart failure. He has undergone multiple iliac stenting procedures, including treatment with covered stents. Medical therapy has been recommended for further treatment of his occlusive vascular disease.  His last ABIs are 0.75 on the right and 0.80 on the left. Velocities demonstrated severe bilateral iliac disease as well as common femoral arterial disease.  He was recently hospitalized with pneumonia and sustained a non-ST elevation infarction, presumably a type II event. He presents today for hospital followup evaluation. Apparently his antihypertensives were held because of question of aspiration. His blood pressure spiked and at that point he developed severe substernal chest pain. He ultimately ruled in for non-ST elevation infarction. A Myoview scan was performed in this demonstrated no ischemia. Medical therapy was recommended. Patient has not had further chest pain. The patient complains of generalized fatigue. He has chronic shortness of breath without significant change. He does not follow a prudent diet. He is very sedentary based on multiple medical problems.  Outpatient Encounter Prescriptions as of 11/08/2013  Medication Sig  . acetaminophen (TYLENOL) 650 MG CR tablet Take 1,300 mg by mouth 2 (two) times daily.  Marland Kitchen albuterol (PROAIR HFA) 108 (90 BASE) MCG/ACT inhaler Inhale 2 puffs into the lungs daily as needed for wheezing or shortness of breath.  Marland Kitchen albuterol (PROVENTIL) (2.5 MG/3ML) 0.083% nebulizer solution Take 2.5 mg by nebulization 4 (four) times daily.  Marland Kitchen allopurinol (ZYLOPRIM) 300 MG tablet Take 300 mg by mouth 2 (two) times daily.  Marland Kitchen amLODipine (NORVASC) 10 MG tablet  Take 10 mg by mouth daily.  Marland Kitchen aspirin EC 325 MG tablet Take 1 tablet (325 mg total) by mouth daily.  Marland Kitchen atorvastatin (LIPITOR) 20 MG tablet Take 20 mg by mouth daily.   . B-D UF III MINI PEN NEEDLES 31G X 5 MM MISC   . cloNIDine HCl (KAPVAY) 0.1 MG TB12 ER tablet Take 1 tablet (0.1 mg total) by mouth 2 (two) times daily.  . colchicine 0.6 MG tablet Take 0.6 mg by mouth 2 (two) times daily.  Marland Kitchen escitalopram (LEXAPRO) 20 MG tablet Take 20 mg by mouth daily.  Marland Kitchen ezetimibe (ZETIA) 10 MG tablet Take 10 mg by mouth daily.   . folic acid (FOLVITE) 376 MCG tablet Take 400 mcg by mouth daily.   . furosemide (LASIX) 80 MG tablet Take 1 tablet (80 mg total) by mouth 2 (two) times daily.  Marland Kitchen gabapentin (NEURONTIN) 100 MG tablet Take 100 mg by mouth at bedtime.   . insulin aspart (NOVOLOG) 100 UNIT/ML injection Inject 20 Units into the skin 3 (three) times daily with meals.  . insulin glargine (LANTUS) 100 UNIT/ML injection Inject 0.45 mLs (45 Units total) into the skin 2 (two) times daily.  . isosorbide mononitrate (IMDUR) 60 MG 24 hr tablet Take 60 mg by mouth daily.  . metolazone (ZAROXOLYN) 2.5 MG tablet Take 2.5 mg by mouth See admin instructions. Take 30 minutes prior to morning furosemide dose for a weight gain of 3 lbs or more  . Multiple Vitamin (MULTIVITAMIN WITH MINERALS) TABS Take 0.5 tablets by mouth 2 (two) times daily.  . nitroGLYCERIN (NITROSTAT) 0.4 MG SL tablet Place 0.4 mg under the tongue every 5 (five) minutes as needed for chest pain. May repeat up to 3 times  .  ONE TOUCH ULTRA TEST test strip   . pantoprazole (PROTONIX) 40 MG tablet Take 40 mg by mouth daily.   . predniSONE (DELTASONE) 5 MG tablet Take 5 mg by mouth daily.  Marland Kitchen tiotropium (SPIRIVA) 18 MCG inhalation capsule Place 18 mcg into inhaler and inhale at bedtime.  . [DISCONTINUED] amoxicillin (AMOXIL) 500 MG capsule Take 1 capsule (500 mg total) by mouth every 8 (eight) hours. Take your last dose tonight at 10PM.  . [DISCONTINUED]  azithromycin (ZITHROMAX) 500 MG tablet Take 1 tablet (500 mg total) by mouth at bedtime. Take your last dose tonight at 10PM.  . [DISCONTINUED] fluconazole (DIFLUCAN) 100 MG tablet Take 1 tablet (100 mg total) by mouth daily.    Allergies  Allergen Reactions  . Donepezil Diarrhea  . Heparin Rash and Other (See Comments)    Family stated it almost killed him. Hallucinations and itching.  Marland Kitchen Hydralazine Other (See Comments)    DO NOT TAKE PER MD  . Hydromorphone Itching  . Morphine Itching  . Plavix [Clopidogrel Bisulfate] Other (See Comments)    GI BLEEDING; "so bad I had to take blood; dr took me off it"  . Promethazine Other (See Comments)    respiratory failure  . Sulfonamide Derivatives Itching  . Cefuroxime Other (See Comments)     unkown reaction  . Metoprolol Other (See Comments)    unknown  . Ertapenem Itching and Rash    12/31/2011 pt does not recall allergy or severity    Past Medical History  Diagnosis Date  . Adenomatous colon polyp   . Diverticulosis   . GERD (gastroesophageal reflux disease)   . Hiatal hernia   . Alzheimer disease   . Gout     "hands; backbone; elbows; shoulders"  . Asthma with bronchitis   . COPD (chronic obstructive pulmonary disease)   . Chronic hypoxemic respiratory failure   . CHF (congestive heart failure)     grade 2 diastolic dysfunction, EF 23-55% on ECHO 12/2011  . Blood transfusion     "I've had about 6 pints; related to a GI bleed; colon bleeding"   . Anemia   . GI bleed     GI [name],   . Hypertension   . Peripheral vascular disease     PAD  . Dysrhythmia     "skips"  . Anginal pain   . Myocardial infarction     "they said I had 2 light ones"  . Shortness of breath 12/31/2011    "all the time"  . Type II diabetes mellitus     with complications:  retinopathy, nephrophathy.   . Arthritis     "plenty"  . Chronic lower back pain   . Renal insufficiency     "went on dialysis probably 3 times" Last 04/2011  . Skin cancer      "forehead; arms"  . Depression   . Diverticulosis   . Colon polyps     adenomatous and hyperplastic  . GERD (gastroesophageal reflux disease)   . Hiatal hernia   . Memory deficits 01/11/2013  . Obesity   . Carotid bruit     bilateral  . CKD (chronic kidney disease) 03/07/2013  . Hemorrhoids   . AVM (arteriovenous malformation) of colon   . Gastric polyp   . Atrial fibrillation   . High cholesterol   . Pneumonia     "have had it several times"  . Chronic bronchitis   . OSA on CPAP   . On home  oxygen therapy     "3L just at night" (10/16/2013)    ROS: Negative except as per HPI  BP 136/59  Pulse 72  Ht 5\' 6"  (1.676 m)  Wt 106.777 kg (235 lb 6.4 oz)  BMI 38.01 kg/m2  PHYSICAL EXAM: Pt is alert and oriented, pleasant obese male in NAD HEENT: normal Neck: JVP - normal, carotids 2+= with bruits bilaterally, right greater than left  Lungs: CTA bilaterally CV: RRR without murmur or gallop Abd: soft, NT, Positive BS, obese Ext: 1+ pretibial and ankle edema bilaterally Skin: warm/dry no rash  ASSESSMENT AND PLAN: 1. Coronary artery disease, native vessel. The patient is status post CABG. recent non-ST elevation infarction noted. Myoview scan was low risk with no major ischemia noted. LVEF was mildly depressed. I've recommended an echocardiogram for better assessment of LV function. He will continue on his current medical program. The patient's wife is concerned that his non-ST elevation infarction was related to his oral antihypertensives not being administered while he was in the hospital. She has asked me to investigate this further.  2. Chronic diastolic heart failure. Stable symptoms and weight on furosemide 80 mg twice daily. Medical program was reviewed and no changes were made today. The importance of sodium restriction was reinforced, but the patient clearly will not follow dietary recommendations.  3. Malignant hypertension. Blood pressure is controlled on a  combination of amlodipine, clonidine, furosemide, and isosorbide.  I'll see him back in 4 months unless problems arise in the interim.  Sherren Mocha 11/09/2013 11:14 PM

## 2013-11-09 NOTE — Assessment & Plan Note (Signed)
This is due to anemia. At present time, he is not a candidate for blood transfusion. I am confident he will respond well to intravenous iron infusion.

## 2013-11-09 NOTE — Progress Notes (Signed)
[name] OFFICE PROGRESS NOTE  Thomas Downing, MD DIAGNOSIS:  Chronic anemia, multifactorial due to to chronic GI bleed from arteriovenous malformation, on chronic antiplatelet agents for peripheral vascular disease and anemia chronic disease from chronic kidney failure  SUMMARY OF HEMATOLOGIC HISTORY: His last normal CBC was around 2008 with a hemoglobin of 14. Something around 2012, he had worsening anemia secondary to bleeding AVM. Plavix was discontinued. Between 2012 to 2014, Chronic GI bleed and most recently his antiplatelet agents was put on hold due to frequent bleeding. The patient requires some blood transfusions intermittently this year. October 2014, he received 1 dose of intravenous iron infusion INTERVAL HISTORY: [name] 80 y.o. male returns for routine followup. He complains of fatigue. He has noticed some passage of occasional dark stools.  I have reviewed the past medical history, past surgical history, social history and family history with the patient and they are unchanged from previous note.  ALLERGIES:  is allergic to donepezil; heparin; hydralazine; hydromorphone; morphine; plavix; promethazine; sulfonamide derivatives; cefuroxime; metoprolol; and ertapenem.  MEDICATIONS:  Current Outpatient Prescriptions  Medication Sig Dispense Refill  . acetaminophen (TYLENOL) 650 MG CR tablet Take 1,300 mg by mouth 2 (two) times daily.      Marland Kitchen albuterol (PROAIR HFA) 108 (90 BASE) MCG/ACT inhaler Inhale 2 puffs into the lungs daily as needed for wheezing or shortness of breath.      Marland Kitchen albuterol (PROVENTIL) (2.5 MG/3ML) 0.083% nebulizer solution Take 2.5 mg by nebulization 4 (four) times daily.      Marland Kitchen allopurinol (ZYLOPRIM) 300 MG tablet Take 300 mg by mouth 2 (two) times daily.      Marland Kitchen amLODipine (NORVASC) 10 MG tablet Take 10 mg by mouth daily.      Marland Kitchen aspirin EC 325 MG tablet Take 1 tablet (325 mg total) by mouth daily.  30 tablet  0  .  atorvastatin (LIPITOR) 20 MG tablet Take 20 mg by mouth daily.       . B-D UF III MINI PEN NEEDLES 31G X 5 MM MISC       . cloNIDine HCl (KAPVAY) 0.1 MG TB12 ER tablet Take 1 tablet (0.1 mg total) by mouth 2 (two) times daily.  60 tablet  5  . colchicine 0.6 MG tablet Take 0.6 mg by mouth 2 (two) times daily.      Marland Kitchen escitalopram (LEXAPRO) 20 MG tablet Take 20 mg by mouth daily.      Marland Kitchen ezetimibe (ZETIA) 10 MG tablet Take 10 mg by mouth daily.       . folic acid (FOLVITE) 193 MCG tablet Take 400 mcg by mouth daily.       . furosemide (LASIX) 80 MG tablet Take 1 tablet (80 mg total) by mouth 2 (two) times daily.  180 tablet  3  . gabapentin (NEURONTIN) 100 MG tablet Take 100 mg by mouth at bedtime.       . insulin aspart (NOVOLOG) 100 UNIT/ML injection Inject 20 Units into the skin 3 (three) times daily with meals.      . insulin glargine (LANTUS) 100 UNIT/ML injection Inject 0.45 mLs (45 Units total) into the skin 2 (two) times daily.  10 mL  0  . isosorbide mononitrate (IMDUR) 60 MG 24 hr tablet Take 60 mg by mouth daily.      . metolazone (ZAROXOLYN) 2.5 MG tablet Take 2.5 mg by mouth See admin instructions. Take 30 minutes prior to morning furosemide dose for a weight gain  of 3 lbs or more      . Multiple Vitamin (MULTIVITAMIN WITH MINERALS) TABS Take 0.5 tablets by mouth 2 (two) times daily.      . nitroGLYCERIN (NITROSTAT) 0.4 MG SL tablet Place 0.4 mg under the tongue every 5 (five) minutes as needed for chest pain. May repeat up to 3 times      . ONE TOUCH ULTRA TEST test strip       . pantoprazole (PROTONIX) 40 MG tablet Take 40 mg by mouth daily.       . predniSONE (DELTASONE) 5 MG tablet Take 5 mg by mouth daily.      Marland Kitchen tiotropium (SPIRIVA) 18 MCG inhalation capsule Place 18 mcg into inhaler and inhale at bedtime.       No current facility-administered medications for this visit.     REVIEW OF SYSTEMS:   Constitutional: Denies fevers, chills or night sweats Eyes: Denies blurriness of  vision Ears, nose, mouth, throat, and face: Denies mucositis or sore throat Gastrointestinal:  Denies nausea, heartburn or change in bowel habits Skin: Denies abnormal skin rashes Lymphatics: Denies new lymphadenopathy or easy bruising Neurological:Denies numbness, tingling or new weaknesses Behavioral/Psych: Mood is stable, no new changes  All other systems were reviewed with the patient and are negative.  PHYSICAL EXAMINATION: ECOG PERFORMANCE STATUS: 1 - Symptomatic but completely ambulatory  Filed Vitals:   11/09/13 0957  BP: 130/41  Pulse: 71  Temp: 97.7 F (36.5 C)  Resp: 18   Filed Weights   11/09/13 0957  Weight: 240 lb (108.863 kg)    GENERAL:alert, no distress and comfortable. He looks pale SKIN: skin color is pale, texture, turgor are normal, no rashes or significant lesions EYES: normal, Conjunctiva are pale and non-injected, sclera clear OROPHARYNX:no exudate, no erythema and lips, buccal mucosa, and tongue normal  ABDOMEN:abdomen soft, non-tender and normal bowel sounds Musculoskeletal:no cyanosis of digits and no clubbing  NEURO: alert & oriented x 3 with fluent speech, no focal motor/sensory deficits  LABORATORY DATA:  I have reviewed the data as listed Results for orders placed in visit on 11/09/13 (from the past 48 hour(s))  CBC & DIFF AND RETIC     Status: Abnormal   Collection Time    11/09/13  9:39 AM      Result Value Ref Range   WBC 9.1  4.0 - 10.3 10e3/uL   NEUT# 6.8 (*) 1.5 - 6.5 10e3/uL   HGB 8.2 (*) 13.0 - 17.1 g/dL   HCT 27.8 (*) 38.4 - 49.9 %   Platelets 212  140 - 400 10e3/uL   MCV 88.3  79.3 - 98.0 fL   MCH 26.0 (*) 27.2 - 33.4 pg   MCHC 29.5 (*) 32.0 - 36.0 g/dL   RBC 3.15 (*) 4.20 - 5.82 10e6/uL   RDW 17.9 (*) 11.0 - 14.6 %   lymph# 1.1  0.9 - 3.3 10e3/uL   MONO# 0.8  0.1 - 0.9 10e3/uL   Eosinophils Absolute 0.4  0.0 - 0.5 10e3/uL   Basophils Absolute 0.0  0.0 - 0.1 10e3/uL   NEUT% 74.7  39.0 - 75.0 %   LYMPH% 11.8 (*) 14.0 -  49.0 %   MONO% 8.6  0.0 - 14.0 %   EOS% 4.7  0.0 - 7.0 %   BASO% 0.2  0.0 - 2.0 %   Retic % 3.57 (*) 0.80 - 1.80 %   Retic Ct Abs 112.46 (*) 34.80 - 93.90 10e3/uL   Immature Retic Fract 11.10 (*)  3.00 - 10.60 %  LACTATE DEHYDROGENASE (CC13)     Status: None   Collection Time    11/09/13  9:39 AM      Result Value Ref Range   LDH 226  125 - 245 U/L  TECHNOLOGIST REVIEW     Status: None   Collection Time    11/09/13  9:39 AM      Result Value Ref Range   Technologist Review rare myelo present    COMPREHENSIVE METABOLIC PANEL (IO96)     Status: Abnormal   Collection Time    11/09/13  9:39 AM      Result Value Ref Range   Sodium 140  136 - 145 mEq/L   Potassium 4.0  3.5 - 5.1 mEq/L   Chloride 102  98 - 109 mEq/L   CO2 26  22 - 29 mEq/L   Glucose 267 (*) 70 - 140 mg/dl   BUN 45.9 (*) 7.0 - 26.0 mg/dL   Creatinine 2.3 (*) 0.7 - 1.3 mg/dL   Total Bilirubin 0.56  0.20 - 1.20 mg/dL   Alkaline Phosphatase 78  40 - 150 U/L   AST 25  5 - 34 U/L   ALT 24  0 - 55 U/L   Total Protein 6.8  6.4 - 8.3 g/dL   Albumin 3.3 (*) 3.5 - 5.0 g/dL   Calcium 9.3  8.4 - 10.4 mg/dL   Anion Gap 12 (*) 3 - 11 mEq/L  FERRITIN [facility]     Status: None   Collection Time    11/09/13  9:39 AM      Result Value Ref Range   Ferritin 28  22 - 316 ng/ml  IRON AND TIBC [facility]     Status: Abnormal   Collection Time    11/09/13  9:39 AM      Result Value Ref Range   Iron 20 (*) 42 - 163 ug/dL   TIBC 321  202 - 409 ug/dL   UIBC 302  117 - 376 ug/dL   %SAT 6 (*) 20 - 55 %    Lab Results  Component Value Date   WBC 9.1 11/09/2013   HGB 8.2* 11/09/2013   HCT 27.8* 11/09/2013   MCV 88.3 11/09/2013   PLT 212 11/09/2013    ASSESSMENT & PLAN:  Anemia This is due to ongoing GI bleed until proven otherwise. We discussed some of the risks, benefits, and alternatives of intravenous iron infusions. The patient is symptomatic from anemia and the iron level is critically low. He tolerated oral iron supplement poorly and  desires to achieved higher levels of iron faster for adequate hematopoesis. Some of the side-effects to be expected including risks of infusion reactions, phlebitis, headaches, nausea and fatigue.  The patient is willing to proceed. Patient education material was dispensed.  Goal is to keep ferritin level greater than 50 I recommend the patient increase folic acid supplementation.  Gastric and duodenal angiodysplasia with hemorrhage The patient is known to have angiodysplasias. He is on antiplatelet agents due to cardiac disease. The patient needs to be monitored carefully. I will bring him back to have repeat blood count in a month and to see him back again in 3 months.   Shortness of breath This is due to anemia. At present time, he is not a candidate for blood transfusion. I am confident he will respond well to intravenous iron infusion.    All questions were answered. The patient knows to call the clinic with any  problems, questions or concerns. No barriers to learning was detected.  I spent 25 minutes counseling the patient face to face. The total time spent in the appointment was 30 minutes and more than 50% was on counseling.     [place], Morrow, MD 11/09/2013 8:30 PM

## 2013-11-09 NOTE — Assessment & Plan Note (Signed)
The patient is known to have angiodysplasias. He is on antiplatelet agents due to cardiac disease. The patient needs to be monitored carefully. I will bring him back to have repeat blood count in a month and to see him back again in 3 months.

## 2013-11-09 NOTE — Telephone Encounter (Signed)
gave ptm appt for labs,md and IV iron

## 2013-11-09 NOTE — Telephone Encounter (Signed)
gave pt appt for lab,md and Iv iron

## 2013-11-09 NOTE — Assessment & Plan Note (Addendum)
This is due to ongoing GI bleed until proven otherwise. We discussed some of the risks, benefits, and alternatives of intravenous iron infusions. The patient is symptomatic from anemia and the iron level is critically low. He tolerated oral iron supplement poorly and desires to achieved higher levels of iron faster for adequate hematopoesis. Some of the side-effects to be expected including risks of infusion reactions, phlebitis, headaches, nausea and fatigue.  The patient is willing to proceed. Patient education material was dispensed.  Goal is to keep ferritin level greater than 50 I recommend the patient increase folic acid supplementation.

## 2013-11-10 ENCOUNTER — Ambulatory Visit (HOSPITAL_BASED_OUTPATIENT_CLINIC_OR_DEPARTMENT_OTHER): Payer: BC Managed Care – PPO

## 2013-11-10 VITALS — BP 149/63 | HR 66 | Temp 98.0°F | Resp 18

## 2013-11-10 DIAGNOSIS — K922 Gastrointestinal hemorrhage, unspecified: Secondary | ICD-10-CM

## 2013-11-10 DIAGNOSIS — D500 Iron deficiency anemia secondary to blood loss (chronic): Secondary | ICD-10-CM

## 2013-11-10 DIAGNOSIS — K31811 Angiodysplasia of stomach and duodenum with bleeding: Secondary | ICD-10-CM

## 2013-11-10 MED ORDER — SODIUM CHLORIDE 0.9 % IV SOLN
Freq: Once | INTRAVENOUS | Status: AC
  Administered 2013-11-10: 11:00:00 via INTRAVENOUS

## 2013-11-10 MED ORDER — SODIUM CHLORIDE 0.9 % IV SOLN
1020.0000 mg | Freq: Once | INTRAVENOUS | Status: AC
  Administered 2013-11-10: 1020 mg via INTRAVENOUS
  Filled 2013-11-10: qty 34

## 2013-11-10 NOTE — Patient Instructions (Signed)

## 2013-11-14 ENCOUNTER — Telehealth: Payer: Self-pay | Admitting: Hematology and Oncology

## 2013-11-14 ENCOUNTER — Other Ambulatory Visit: Payer: Self-pay | Admitting: Nurse Practitioner

## 2013-11-14 ENCOUNTER — Telehealth: Payer: Self-pay | Admitting: *Deleted

## 2013-11-14 ENCOUNTER — Other Ambulatory Visit: Payer: Self-pay | Admitting: *Deleted

## 2013-11-14 DIAGNOSIS — R0600 Dyspnea, unspecified: Secondary | ICD-10-CM

## 2013-11-14 NOTE — Telephone Encounter (Signed)
per 6/23 pof wife instructed to take pt to [facility] for cxr. no other orders per 6/23 pof

## 2013-11-14 NOTE — Telephone Encounter (Signed)
Reviewed RN's documentation below. Pt has experienced episodes of shortness of breath despite having Feraheme infusionon 11/10/2013. Patient wondering if this is delayed reaction to [facility]. I think this unlikely.   Given the reported acuity of patient's dyspnea & chills; , ordering Chest Xray to rule out bronchial infection or less likely, pneumonia. It is possible this is an atypical presentation of a blood clot, but am not inclined to order a VQ scan unless CXR is abnormal. Patient does not have any known risk factors for pulmonary embolus.   Assessment/ Plan: 1) Dyspnea & chills- Chest Xray ordered. Will follow up after patient gets films.

## 2013-11-14 NOTE — Telephone Encounter (Signed)
   Provider input needed: Feraheme side effects   Reason for call: He usually feels better after feraheme but feels poorly after this one.  Constitutional: positive for chills Respiratory: positive for "just said he couldn't breathe"   ALLERGIES:  is allergic to donepezil; heparin; hydralazine; hydromorphone; morphine; plavix; promethazine; sulfonamide derivatives; cefuroxime; metoprolol; and ertapenem.  Patient last received chemotherapy/ treatment on 11-10-2013 received Feraheme.  Patient was last seen in the office on 11-09-2013  Next appt is 02-09-2014  Is patient having fevers greater than 100.5?  Chills on 11-10-2013 and 11-13-2013 and wife did not check temperature.  At this time temperature = 98.4.   Is patient having uncontrolled pain, or new pain? no   Is patient having new back pain that changes with position (worsens or eases when laying down?)  no   Is patient able to eat and drink? yes    Is patient able to pass stool without difficulty?   yes, Last bm yesterday.     Is patient having uncontrolled nausea?  no    Wife [name] calls 11/14/2013 with complaint of  Constitutional: positive for chills and "weak, tired, hard to go to sleep.  Took a pill for sleep last night, didn't sleep and now today he is sleeping more.  Chills off and on so wraped in blankets."  Respiratory: positive for "says he couldn't breathe so I put his oxygen on he hasn't worn since d/c'd from hospital 10-19-2013.  O2 sats = 96 to 97 % and he says he is breathing better with oxygen on.  b/p = 151/55, p= 57.  I looked the feraheme up and these are side effects of the medicine."   Summary Based on the above information advised family to  Await return call as this nurse will notify APP.  Verified return number is [phone].   [name], [name]  11/14/2013, 2:11 PM   Background Info  [name]   DOB: [birth year]   MR#: [id number]   CSN#   [id number] 11/14/2013

## 2013-11-14 NOTE — Telephone Encounter (Signed)
Called patien'e wife [name].  Instructed to go to [facility] Radiology department to register for CXR for office to receive results before 5:00 pm.  [name] says "I'll Try".

## 2013-11-15 NOTE — Telephone Encounter (Signed)
No results noted with CXR ordered yesterday.  Called home number, spoke with [name] who reports "[name] took the kidney booster (Zaroxolyn) and thirty minutes later took his medications.  He lost four pounds going to the bathroom and feels much better.  We're going to have to keep a better check.  I didn't realize he'd put on so much fluid."  Will notify providers.

## 2013-11-29 ENCOUNTER — Ambulatory Visit (HOSPITAL_COMMUNITY): Payer: BC Managed Care – PPO | Attending: Cardiovascular Disease | Admitting: Cardiology

## 2013-11-29 DIAGNOSIS — R0609 Other forms of dyspnea: Secondary | ICD-10-CM | POA: Insufficient documentation

## 2013-11-29 DIAGNOSIS — R0989 Other specified symptoms and signs involving the circulatory and respiratory systems: Secondary | ICD-10-CM | POA: Insufficient documentation

## 2013-11-29 DIAGNOSIS — I10 Essential (primary) hypertension: Secondary | ICD-10-CM | POA: Insufficient documentation

## 2013-11-29 DIAGNOSIS — E785 Hyperlipidemia, unspecified: Secondary | ICD-10-CM | POA: Insufficient documentation

## 2013-11-29 DIAGNOSIS — J449 Chronic obstructive pulmonary disease, unspecified: Secondary | ICD-10-CM | POA: Insufficient documentation

## 2013-11-29 DIAGNOSIS — J4489 Other specified chronic obstructive pulmonary disease: Secondary | ICD-10-CM | POA: Insufficient documentation

## 2013-11-29 DIAGNOSIS — I5032 Chronic diastolic (congestive) heart failure: Secondary | ICD-10-CM

## 2013-11-29 DIAGNOSIS — I517 Cardiomegaly: Secondary | ICD-10-CM | POA: Insufficient documentation

## 2013-11-29 DIAGNOSIS — I079 Rheumatic tricuspid valve disease, unspecified: Secondary | ICD-10-CM | POA: Insufficient documentation

## 2013-11-29 DIAGNOSIS — I509 Heart failure, unspecified: Secondary | ICD-10-CM | POA: Insufficient documentation

## 2013-11-29 DIAGNOSIS — I359 Nonrheumatic aortic valve disorder, unspecified: Secondary | ICD-10-CM | POA: Insufficient documentation

## 2013-11-29 DIAGNOSIS — I2510 Atherosclerotic heart disease of native coronary artery without angina pectoris: Secondary | ICD-10-CM | POA: Insufficient documentation

## 2013-11-29 DIAGNOSIS — E669 Obesity, unspecified: Secondary | ICD-10-CM | POA: Insufficient documentation

## 2013-11-29 DIAGNOSIS — I4891 Unspecified atrial fibrillation: Secondary | ICD-10-CM

## 2013-11-29 DIAGNOSIS — I25118 Atherosclerotic heart disease of native coronary artery with other forms of angina pectoris: Secondary | ICD-10-CM

## 2013-11-29 DIAGNOSIS — I379 Nonrheumatic pulmonary valve disorder, unspecified: Secondary | ICD-10-CM | POA: Insufficient documentation

## 2013-11-29 NOTE — Progress Notes (Signed)
Echo performed. 

## 2013-11-30 ENCOUNTER — Telehealth: Payer: Self-pay | Admitting: Cardiovascular Disease

## 2013-11-30 NOTE — Telephone Encounter (Signed)
New message ° ° ° ° ° °Want echo results °

## 2013-11-30 NOTE — Telephone Encounter (Signed)
Echo results reviewed with patient's wife who is currently with patient and reports calling on his behalf.  Wife verbalized understanding

## 2013-12-08 ENCOUNTER — Telehealth: Payer: Self-pay | Admitting: *Deleted

## 2013-12-08 ENCOUNTER — Telehealth: Payer: Self-pay | Admitting: Hematology and Oncology

## 2013-12-08 ENCOUNTER — Ambulatory Visit (HOSPITAL_COMMUNITY)
Admission: RE | Admit: 2013-12-08 | Discharge: 2013-12-08 | Disposition: A | Discharge status: Home / Self Care | Payer: BC Managed Care – PPO | Source: Ambulatory Visit | Attending: Hematology and Oncology | Admitting: Hematology and Oncology

## 2013-12-08 ENCOUNTER — Ambulatory Visit: Payer: BC Managed Care – PPO

## 2013-12-08 ENCOUNTER — Other Ambulatory Visit: Payer: Self-pay

## 2013-12-08 ENCOUNTER — Other Ambulatory Visit (HOSPITAL_BASED_OUTPATIENT_CLINIC_OR_DEPARTMENT_OTHER): Payer: BC Managed Care – PPO

## 2013-12-08 ENCOUNTER — Other Ambulatory Visit: Payer: Self-pay | Admitting: Hematology and Oncology

## 2013-12-08 VITALS — BP 149/38 | HR 59 | Temp 97.8°F | Resp 16

## 2013-12-08 DIAGNOSIS — K922 Gastrointestinal hemorrhage, unspecified: Secondary | ICD-10-CM | POA: Insufficient documentation

## 2013-12-08 DIAGNOSIS — D649 Anemia, unspecified: Secondary | ICD-10-CM

## 2013-12-08 LAB — CBC & DIFF AND RETIC
BASO%: 0.1 % (ref 0.0–2.0)
Basophils Absolute: 0 10*3/uL (ref 0.0–0.1)
EOS ABS: 0.2 10*3/uL (ref 0.0–0.5)
EOS%: 1.7 % (ref 0.0–7.0)
HCT: 26.3 % — ABNORMAL LOW (ref 38.4–49.9)
HGB: 8 g/dL — ABNORMAL LOW (ref 13.0–17.1)
Immature Retic Fract: 14.1 % — ABNORMAL HIGH (ref 3.00–10.60)
LYMPH%: 8.9 % — AB (ref 14.0–49.0)
MCH: 28.5 pg (ref 27.2–33.4)
MCHC: 30.4 g/dL — ABNORMAL LOW (ref 32.0–36.0)
MCV: 93.6 fL (ref 79.3–98.0)
MONO#: 0.9 10*3/uL (ref 0.1–0.9)
MONO%: 8.9 % (ref 0.0–14.0)
NEUT%: 80.4 % — AB (ref 39.0–75.0)
NEUTROS ABS: 8.4 10*3/uL — AB (ref 1.5–6.5)
PLATELETS: 163 10*3/uL (ref 140–400)
RBC: 2.81 10*6/uL — ABNORMAL LOW (ref 4.20–5.82)
RDW: 21.5 % — ABNORMAL HIGH (ref 11.0–14.6)
RETIC %: 5.18 % — AB (ref 0.80–1.80)
Retic Ct Abs: 145.56 10*3/uL — ABNORMAL HIGH (ref 34.80–93.90)
WBC: 10.5 10*3/uL — ABNORMAL HIGH (ref 4.0–10.3)
lymph#: 0.9 10*3/uL (ref 0.9–3.3)

## 2013-12-08 LAB — HOLD TUBE, BLOOD BANK

## 2013-12-08 LAB — PREPARE RBC (CROSSMATCH)

## 2013-12-08 LAB — FERRITIN CHCC: FERRITIN: 70 ng/mL (ref 22–316)

## 2013-12-08 MED ORDER — SODIUM CHLORIDE 0.9 % IV SOLN: 250.0000 mL | Freq: Once | INTRAVENOUS | Status: DC

## 2013-12-08 NOTE — Patient Instructions (Signed)

## 2013-12-08 NOTE — Telephone Encounter (Signed)
Left message for patient to call us back regarding labs.

## 2013-12-08 NOTE — Progress Notes (Signed)
Per NG - pt to receive 1 unit of blood if symptomatic.  Infusion ok and MW will enter appts.  Orders entered.

## 2013-12-08 NOTE — Telephone Encounter (Signed)
S/w the pt's dtr and she is aware of the lab appt on 12/14/2013.

## 2013-12-09 LAB — TYPE AND SCREEN
ABO/RH(D): O POS
ANTIBODY SCREEN: NEGATIVE
UNIT DIVISION: 0

## 2013-12-11 ENCOUNTER — Other Ambulatory Visit: Payer: Self-pay

## 2013-12-11 ENCOUNTER — Observation Stay (HOSPITAL_COMMUNITY)
Admission: EM | Admit: 2013-12-11 | Discharge: 2013-12-12 | Disposition: A | Discharge status: Home / Self Care | Payer: BC Managed Care – PPO | Attending: Cardiovascular Disease | Admitting: Cardiovascular Disease

## 2013-12-11 ENCOUNTER — Encounter (HOSPITAL_COMMUNITY): Payer: Self-pay | Admitting: Emergency Medicine

## 2013-12-11 ENCOUNTER — Emergency Department (HOSPITAL_COMMUNITY): Payer: BC Managed Care – PPO

## 2013-12-11 DIAGNOSIS — IMO0002 Reserved for concepts with insufficient information to code with codable children: Secondary | ICD-10-CM | POA: Insufficient documentation

## 2013-12-11 DIAGNOSIS — Z85828 Personal history of other malignant neoplasm of skin: Secondary | ICD-10-CM | POA: Insufficient documentation

## 2013-12-11 DIAGNOSIS — J449 Chronic obstructive pulmonary disease, unspecified: Secondary | ICD-10-CM | POA: Insufficient documentation

## 2013-12-11 DIAGNOSIS — E119 Type 2 diabetes mellitus without complications: Secondary | ICD-10-CM | POA: Diagnosis present

## 2013-12-11 DIAGNOSIS — Z9981 Dependence on supplemental oxygen: Secondary | ICD-10-CM | POA: Insufficient documentation

## 2013-12-11 DIAGNOSIS — N186 End stage renal disease: Secondary | ICD-10-CM | POA: Insufficient documentation

## 2013-12-11 DIAGNOSIS — E11319 Type 2 diabetes mellitus with unspecified diabetic retinopathy without macular edema: Secondary | ICD-10-CM | POA: Insufficient documentation

## 2013-12-11 DIAGNOSIS — Z882 Allergy status to sulfonamides status: Secondary | ICD-10-CM | POA: Insufficient documentation

## 2013-12-11 DIAGNOSIS — I120 Hypertensive chronic kidney disease with stage 5 chronic kidney disease or end stage renal disease: Secondary | ICD-10-CM | POA: Insufficient documentation

## 2013-12-11 DIAGNOSIS — N058 Unspecified nephritic syndrome with other morphologic changes: Secondary | ICD-10-CM | POA: Insufficient documentation

## 2013-12-11 DIAGNOSIS — R079 Chest pain, unspecified: Principal | ICD-10-CM | POA: Insufficient documentation

## 2013-12-11 DIAGNOSIS — Z79899 Other long term (current) drug therapy: Secondary | ICD-10-CM | POA: Insufficient documentation

## 2013-12-11 DIAGNOSIS — Z860100 Personal history of colon polyps, unspecified: Secondary | ICD-10-CM | POA: Insufficient documentation

## 2013-12-11 DIAGNOSIS — Z794 Long term (current) use of insulin: Secondary | ICD-10-CM | POA: Insufficient documentation

## 2013-12-11 DIAGNOSIS — E785 Hyperlipidemia, unspecified: Secondary | ICD-10-CM | POA: Diagnosis present

## 2013-12-11 DIAGNOSIS — F329 Major depressive disorder, single episode, unspecified: Secondary | ICD-10-CM | POA: Insufficient documentation

## 2013-12-11 DIAGNOSIS — E669 Obesity, unspecified: Secondary | ICD-10-CM | POA: Insufficient documentation

## 2013-12-11 DIAGNOSIS — G309 Alzheimer's disease, unspecified: Secondary | ICD-10-CM | POA: Insufficient documentation

## 2013-12-11 DIAGNOSIS — E1129 Type 2 diabetes mellitus with other diabetic kidney complication: Secondary | ICD-10-CM | POA: Insufficient documentation

## 2013-12-11 DIAGNOSIS — F3289 Other specified depressive episodes: Secondary | ICD-10-CM | POA: Insufficient documentation

## 2013-12-11 DIAGNOSIS — E1139 Type 2 diabetes mellitus with other diabetic ophthalmic complication: Secondary | ICD-10-CM | POA: Insufficient documentation

## 2013-12-11 DIAGNOSIS — N183 Chronic kidney disease, stage 3 unspecified: Secondary | ICD-10-CM | POA: Diagnosis present

## 2013-12-11 DIAGNOSIS — Z992 Dependence on renal dialysis: Secondary | ICD-10-CM | POA: Insufficient documentation

## 2013-12-11 DIAGNOSIS — N189 Chronic kidney disease, unspecified: Secondary | ICD-10-CM | POA: Diagnosis present

## 2013-12-11 DIAGNOSIS — I10 Essential (primary) hypertension: Secondary | ICD-10-CM | POA: Diagnosis present

## 2013-12-11 DIAGNOSIS — M109 Gout, unspecified: Secondary | ICD-10-CM | POA: Insufficient documentation

## 2013-12-11 DIAGNOSIS — Z862 Personal history of diseases of the blood and blood-forming organs and certain disorders involving the immune mechanism: Secondary | ICD-10-CM | POA: Insufficient documentation

## 2013-12-11 DIAGNOSIS — K219 Gastro-esophageal reflux disease without esophagitis: Secondary | ICD-10-CM | POA: Insufficient documentation

## 2013-12-11 DIAGNOSIS — J4489 Other specified chronic obstructive pulmonary disease: Secondary | ICD-10-CM | POA: Insufficient documentation

## 2013-12-11 DIAGNOSIS — Z8701 Personal history of pneumonia (recurrent): Secondary | ICD-10-CM | POA: Insufficient documentation

## 2013-12-11 DIAGNOSIS — Z7982 Long term (current) use of aspirin: Secondary | ICD-10-CM | POA: Insufficient documentation

## 2013-12-11 DIAGNOSIS — I2510 Atherosclerotic heart disease of native coronary artery without angina pectoris: Secondary | ICD-10-CM | POA: Diagnosis present

## 2013-12-11 DIAGNOSIS — I252 Old myocardial infarction: Secondary | ICD-10-CM | POA: Insufficient documentation

## 2013-12-11 DIAGNOSIS — F0280 Dementia in other diseases classified elsewhere without behavioral disturbance: Secondary | ICD-10-CM | POA: Insufficient documentation

## 2013-12-11 DIAGNOSIS — M129 Arthropathy, unspecified: Secondary | ICD-10-CM | POA: Insufficient documentation

## 2013-12-11 DIAGNOSIS — Z888 Allergy status to other drugs, medicaments and biological substances status: Secondary | ICD-10-CM | POA: Insufficient documentation

## 2013-12-11 DIAGNOSIS — Z9861 Coronary angioplasty status: Secondary | ICD-10-CM | POA: Insufficient documentation

## 2013-12-11 DIAGNOSIS — Z87891 Personal history of nicotine dependence: Secondary | ICD-10-CM | POA: Insufficient documentation

## 2013-12-11 DIAGNOSIS — E7800 Pure hypercholesterolemia, unspecified: Secondary | ICD-10-CM | POA: Insufficient documentation

## 2013-12-11 DIAGNOSIS — G8929 Other chronic pain: Secondary | ICD-10-CM | POA: Insufficient documentation

## 2013-12-11 DIAGNOSIS — I509 Heart failure, unspecified: Secondary | ICD-10-CM | POA: Insufficient documentation

## 2013-12-11 DIAGNOSIS — Z951 Presence of aortocoronary bypass graft: Secondary | ICD-10-CM | POA: Insufficient documentation

## 2013-12-11 DIAGNOSIS — G4733 Obstructive sleep apnea (adult) (pediatric): Secondary | ICD-10-CM | POA: Insufficient documentation

## 2013-12-11 LAB — URINALYSIS, ROUTINE W REFLEX MICROSCOPIC
BILIRUBIN URINE: NEGATIVE
Glucose, UA: NEGATIVE mg/dL
Ketones, ur: NEGATIVE mg/dL
Leukocytes, UA: NEGATIVE
Nitrite: NEGATIVE
Protein, ur: NEGATIVE mg/dL
SPECIFIC GRAVITY, URINE: 1.012 (ref 1.005–1.030)
Urobilinogen, UA: 0.2 mg/dL (ref 0.0–1.0)
pH: 6.5 (ref 5.0–8.0)

## 2013-12-11 LAB — CBC WITH DIFFERENTIAL/PLATELET
BASOS ABS: 0 10*3/uL (ref 0.0–0.1)
BASOS PCT: 0 % (ref 0–1)
Eosinophils Absolute: 0.2 10*3/uL (ref 0.0–0.7)
Eosinophils Relative: 2 % (ref 0–5)
HCT: 29.2 % — ABNORMAL LOW (ref 39.0–52.0)
Hemoglobin: 9 g/dL — ABNORMAL LOW (ref 13.0–17.0)
Lymphocytes Relative: 12 % (ref 12–46)
Lymphs Abs: 1.2 10*3/uL (ref 0.7–4.0)
MCH: 28.5 pg (ref 26.0–34.0)
MCHC: 30.8 g/dL (ref 30.0–36.0)
MCV: 92.4 fL (ref 78.0–100.0)
Monocytes Absolute: 1 10*3/uL (ref 0.1–1.0)
Monocytes Relative: 10 % (ref 3–12)
NEUTROS ABS: 7.8 10*3/uL — AB (ref 1.7–7.7)
NEUTROS PCT: 77 % (ref 43–77)
Platelets: 154 10*3/uL (ref 150–400)
RBC: 3.16 MIL/uL — ABNORMAL LOW (ref 4.22–5.81)
RDW: 20.8 % — AB (ref 11.5–15.5)
WBC: 10.2 10*3/uL (ref 4.0–10.5)

## 2013-12-11 LAB — I-STAT TROPONIN, ED: Troponin i, poc: 0.08 ng/mL (ref 0.00–0.08)

## 2013-12-11 LAB — COMPREHENSIVE METABOLIC PANEL
ALBUMIN: 3.5 g/dL (ref 3.5–5.2)
ALK PHOS: 82 U/L (ref 39–117)
ALT: 19 U/L (ref 0–53)
AST: 20 U/L (ref 0–37)
Anion gap: 14 (ref 5–15)
BILIRUBIN TOTAL: 0.5 mg/dL (ref 0.3–1.2)
BUN: 64 mg/dL — ABNORMAL HIGH (ref 6–23)
CHLORIDE: 95 meq/L — AB (ref 96–112)
CO2: 30 mEq/L (ref 19–32)
Calcium: 9.4 mg/dL (ref 8.4–10.5)
Creatinine, Ser: 2.13 mg/dL — ABNORMAL HIGH (ref 0.50–1.35)
GFR calc Af Amer: 32 mL/min — ABNORMAL LOW (ref >90)
GFR calc non Af Amer: 28 mL/min — ABNORMAL LOW (ref >90)
Glucose, Bld: 320 mg/dL — ABNORMAL HIGH (ref 70–99)
POTASSIUM: 4 meq/L (ref 3.7–5.3)
Sodium: 139 mEq/L (ref 137–147)
Total Protein: 6.9 g/dL (ref 6.0–8.3)

## 2013-12-11 LAB — URINE MICROSCOPIC-ADD ON

## 2013-12-11 LAB — GLUCOSE, CAPILLARY: Glucose-Capillary: 213 mg/dL — ABNORMAL HIGH (ref 70–99)

## 2013-12-11 LAB — TROPONIN I: Troponin I: <0.3 ng/mL (ref <0.30)

## 2013-12-11 LAB — PRO B NATRIURETIC PEPTIDE: PRO B NATRI PEPTIDE: 1685 pg/mL — AB (ref 0–450)

## 2013-12-11 MED ORDER — ASPIRIN EC 325 MG PO TBEC
325.0000 mg | DELAYED_RELEASE_TABLET | Freq: Every day | ORAL | Status: DC
  Administered 2013-12-12: 325 mg via ORAL
  Filled 2013-12-11: qty 1

## 2013-12-11 MED ORDER — CLONIDINE HCL ER 0.1 MG PO TB12
0.1000 mg | ORAL_TABLET | Freq: Two times a day (BID) | ORAL | Status: DC
  Filled 2013-12-11: qty 1

## 2013-12-11 MED ORDER — ISOSORBIDE MONONITRATE ER 60 MG PO TB24
60.0000 mg | ORAL_TABLET | Freq: Every day | ORAL | Status: DC
  Administered 2013-12-12: 60 mg via ORAL
  Filled 2013-12-11: qty 1

## 2013-12-11 MED ORDER — INSULIN ASPART 100 UNIT/ML ~~LOC~~ SOLN
0.0000 [IU] | Freq: Three times a day (TID) | SUBCUTANEOUS | Status: DC
  Administered 2013-12-12 (×2): 3 [IU] via SUBCUTANEOUS

## 2013-12-11 MED ORDER — ESCITALOPRAM OXALATE 20 MG PO TABS
20.0000 mg | ORAL_TABLET | Freq: Every day | ORAL | Status: DC
  Administered 2013-12-12: 20 mg via ORAL
  Filled 2013-12-11: qty 1

## 2013-12-11 MED ORDER — ALBUTEROL SULFATE (2.5 MG/3ML) 0.083% IN NEBU
2.5000 mg | INHALATION_SOLUTION | Freq: Three times a day (TID) | RESPIRATORY_TRACT | Status: DC
  Administered 2013-12-11: 2.5 mg via RESPIRATORY_TRACT
  Filled 2013-12-11: qty 3

## 2013-12-11 MED ORDER — ATORVASTATIN CALCIUM 20 MG PO TABS
20.0000 mg | ORAL_TABLET | Freq: Every day | ORAL | Status: DC
  Administered 2013-12-12: 20 mg via ORAL
  Filled 2013-12-11: qty 1

## 2013-12-11 MED ORDER — COLCHICINE 0.6 MG PO TABS
0.6000 mg | ORAL_TABLET | Freq: Two times a day (BID) | ORAL | Status: DC
  Administered 2013-12-11 – 2013-12-12 (×2): 0.6 mg via ORAL
  Filled 2013-12-11 (×3): qty 1

## 2013-12-11 MED ORDER — PREDNISONE 5 MG PO TABS
5.0000 mg | ORAL_TABLET | Freq: Two times a day (BID) | ORAL | Status: DC
  Administered 2013-12-11 – 2013-12-12 (×2): 5 mg via ORAL
  Filled 2013-12-11 (×3): qty 1

## 2013-12-11 MED ORDER — FUROSEMIDE 80 MG PO TABS
80.0000 mg | ORAL_TABLET | Freq: Two times a day (BID) | ORAL | Status: DC
  Administered 2013-12-11 – 2013-12-12 (×2): 80 mg via ORAL
  Filled 2013-12-11 (×4): qty 1

## 2013-12-11 MED ORDER — INSULIN GLARGINE 100 UNIT/ML ~~LOC~~ SOLN
62.0000 [IU] | Freq: Two times a day (BID) | SUBCUTANEOUS | Status: DC
Start: 2013-12-11 — End: 2013-12-12
  Administered 2013-12-11 – 2013-12-12 (×2): 62 [IU] via SUBCUTANEOUS
  Filled 2013-12-11 (×3): qty 0.62

## 2013-12-11 MED ORDER — NITROGLYCERIN 2 % TD OINT
0.5000 [in_us] | TOPICAL_OINTMENT | Freq: Once | TRANSDERMAL | Status: AC
  Administered 2013-12-11: 0.5 [in_us] via TOPICAL
  Filled 2013-12-11: qty 1

## 2013-12-11 MED ORDER — TIOTROPIUM BROMIDE MONOHYDRATE 18 MCG IN CAPS
18.0000 ug | ORAL_CAPSULE | Freq: Every day | RESPIRATORY_TRACT | Status: DC
  Administered 2013-12-11: 18 ug via RESPIRATORY_TRACT
  Filled 2013-12-11: qty 5

## 2013-12-11 MED ORDER — AMLODIPINE BESYLATE 10 MG PO TABS
10.0000 mg | ORAL_TABLET | Freq: Every day | ORAL | Status: DC
  Administered 2013-12-12: 10 mg via ORAL
  Filled 2013-12-11: qty 1

## 2013-12-11 MED ORDER — ALBUTEROL SULFATE (2.5 MG/3ML) 0.083% IN NEBU: 2.5000 mL | INHALATION_SOLUTION | Freq: Every day | RESPIRATORY_TRACT | Status: DC | PRN

## 2013-12-11 MED ORDER — CLONIDINE HCL 0.1 MG PO TABS
0.1000 mg | ORAL_TABLET | Freq: Three times a day (TID) | ORAL | Status: DC
  Administered 2013-12-11 – 2013-12-12 (×2): 0.1 mg via ORAL
  Filled 2013-12-11 (×4): qty 1

## 2013-12-11 MED ORDER — NITROGLYCERIN 0.4 MG SL SUBL: 0.4000 mg | SUBLINGUAL_TABLET | SUBLINGUAL | Status: DC | PRN

## 2013-12-11 MED ORDER — ACETAMINOPHEN 325 MG PO TABS: 650.0000 mg | ORAL_TABLET | ORAL | Status: DC | PRN

## 2013-12-11 MED ORDER — ALLOPURINOL 300 MG PO TABS
300.0000 mg | ORAL_TABLET | Freq: Two times a day (BID) | ORAL | Status: DC
  Administered 2013-12-11 – 2013-12-12 (×2): 300 mg via ORAL
  Filled 2013-12-11 (×3): qty 1

## 2013-12-11 MED ORDER — ONDANSETRON HCL 4 MG/2ML IJ SOLN: 4.0000 mg | Freq: Four times a day (QID) | INTRAMUSCULAR | Status: DC | PRN

## 2013-12-11 MED ORDER — GABAPENTIN 100 MG PO CAPS
100.0000 mg | ORAL_CAPSULE | Freq: Every day | ORAL | Status: DC
  Administered 2013-12-11: 100 mg via ORAL
  Filled 2013-12-11 (×2): qty 1

## 2013-12-11 MED ORDER — PANTOPRAZOLE SODIUM 40 MG PO TBEC
40.0000 mg | DELAYED_RELEASE_TABLET | Freq: Every day | ORAL | Status: DC
  Administered 2013-12-12: 40 mg via ORAL
  Filled 2013-12-11: qty 1

## 2013-12-11 MED ORDER — EZETIMIBE 10 MG PO TABS
10.0000 mg | ORAL_TABLET | Freq: Every day | ORAL | Status: DC
  Administered 2013-12-12: 10 mg via ORAL
  Filled 2013-12-11: qty 1

## 2013-12-11 NOTE — Progress Notes (Signed)
RT responded to assist pt. With placing on cpap, but RN had already placed pt. On. Pt. Is tolerating well at this time.pt. Has 3L 02 titrated into the cpap.

## 2013-12-11 NOTE — ED Provider Notes (Signed)
CSN: [id number]     Arrival date & time 12/11/13  1110 History   First MD Initiated Contact with Patient 12/11/13 1200     Chief Complaint  Patient presents with  . Chest Pain     (Consider location/radiation/quality/duration/timing/severity/associated sxs/prior Treatment) Patient is a 80 y.o. male presenting with chest pain. The history is provided by the patient and medical records.  Chest Pain  This is a 80 year old male with extensive past medical history including hypertension, hyperlipidemia, COPD, congestive heart failure, prior MI s/o CABG and stenting, presenting to the ED for chest pain.  Patient states he developed pain at 0930 this morning. States pain felt as though someone was "squeezing his heart" with some radiation to left arm. He denies any associated shortness of breath, palpitations, dizziness, weakness.  Pt took 2 SL NTG and ASA PTA with some improvement of pain.  No recent cough, fever, chills, or other URI sx.  No significant leg edema or increase in night time orthopnea. Pt is followed regularly by Dr. Burt Knack.  Admissions earlier this year for CAP and NSTEMI.  Last 2D echo on 11/29/13  Study Conclusions: - Left ventricle: The cavity size was normal. There was mild focal basal hypertrophy of the septum. Systolic function was normal. The estimated ejection fraction was in the range of 55% to 60%. Doppler parameters are consistent with both elevated ventricular end-diastolic filling pressure and elevated left atrial filling pressure. - Aortic valve: There was trivial regurgitation. - Mitral valve: Calcified annulus. - Left atrium: The atrium was moderately dilated. - Atrial septum: There was increased thickness of the septum, consistent with lipomatous hypertrophy. No defect or patent foramen ovale was identified. - Pulmonary arteries: PA peak pressure: 42 mm Hg (S).  Past Medical History  Diagnosis Date  . Adenomatous colon polyp   . Diverticulosis   . GERD  (gastroesophageal reflux disease)   . Hiatal hernia   . Alzheimer disease   . Gout     "hands; backbone; elbows; shoulders"  . Asthma with bronchitis   . COPD (chronic obstructive pulmonary disease)   . Chronic hypoxemic respiratory failure   . CHF (congestive heart failure)     grade 2 diastolic dysfunction, EF 46-27% on ECHO 12/2011  . Blood transfusion     "I've had about 6 pints; related to a GI bleed; colon bleeding"   . Anemia   . GI bleed     GI [name],   . Hypertension   . Peripheral vascular disease     PAD  . Dysrhythmia     "skips"  . Anginal pain   . Myocardial infarction     "they said I had 2 light ones"  . Shortness of breath 12/31/2011    "all the time"  . Type II diabetes mellitus     with complications:  retinopathy, nephrophathy.   . Arthritis     "plenty"  . Chronic lower back pain   . Renal insufficiency     "went on dialysis probably 3 times" Last 04/2011  . Skin cancer     "forehead; arms"  . Depression   . Diverticulosis   . Colon polyps     adenomatous and hyperplastic  . GERD (gastroesophageal reflux disease)   . Hiatal hernia   . Memory deficits 01/11/2013  . Obesity   . Carotid bruit     bilateral  . CKD (chronic kidney disease) 03/07/2013  . Hemorrhoids   . AVM (arteriovenous malformation) of colon   .  Gastric polyp   . Atrial fibrillation   . High cholesterol   . Pneumonia     "have had it several times"  . Chronic bronchitis   . OSA on CPAP   . On home oxygen therapy     "3L just at night" (10/16/2013)   Past Surgical History  Procedure Laterality Date  . Hand surgery Left 1990s    "chain saw accident; had to reattach fingers on left hand"  . Esophagogastroduodenoscopy  05/05/2011    Procedure: ESOPHAGOGASTRODUODENOSCOPY (EGD);  Surgeon: Inda Castle, MD;  Location: [facility];  Service: Endoscopy;  Laterality: N/A;  . Flexible sigmoidoscopy  05/05/2011    Procedure: FLEXIBLE SIGMOIDOSCOPY;  Surgeon: Inda Castle,  MD;  Location: [facility];  Service: Endoscopy;  Laterality: N/A;  . Laparoscopic cholecystectomy  ~ 2001  . Excisional hemorrhoidectomy  1950's  . Cataract extraction w/ intraocular lens  implant, bilateral Bilateral   . Coronary artery bypass graft  1997    CABG X2 vessels  . Iliac artery stent  11/2011    left/H&P  . Renal artery stent  2010    left/H&P  . Iliac artery stent  11/2011    right  . Coronary angioplasty with stent placement  12/31/2011    "I have 15 stents; heart, kidney, aorta, legs"  . Colonoscopy w/ biopsies and polypectomy      "have had probably 5 cut out" (12/31/2011)  . Enteroscopy  03/04/2012    Procedure: ENTEROSCOPY;  Surgeon: Inda Castle, MD;  Location: [place];  Service: Endoscopy;  Laterality: N/A;  jessica/leone  . Esophagogastroduodenoscopy N/A 03/04/2013    Procedure: ESOPHAGOGASTRODUODENOSCOPY (EGD);  Surgeon: Inda Castle, MD;  Location: [place];  Service: Endoscopy;  Laterality: N/A;  . Colonoscopy N/A 03/05/2013    Procedure: COLONOSCOPY;  Surgeon: Inda Castle, MD;  Location: [place];  Service: Endoscopy;  Laterality: N/A;   Family History  Problem Relation Age of Onset  . Lung cancer Brother   . Diabetes Mother   . Arthritis Mother   . Heart attack Mother   . Heart disease Sister   . Stomach cancer Maternal Grandfather   . Heart disease Father   . Heart attack Father   . Heart disease Brother   . ALS Sister   . ALS      nephew/neice  . Colon cancer Neg Hx    History  Substance Use Topics  . Smoking status: Former Smoker -- 2.50 packs/day for 45 years    Types: Cigarettes    Quit date: 05/25/1990  . Smokeless tobacco: Former Systems developer    Types: Chew    Quit date: 06/01/1990  . Alcohol Use: No     Comment: 12/31/2011 "used to drink; last alcohol has been 30 years I imagine"    Review of Systems  Cardiovascular: Positive for chest pain.  All other systems reviewed and are negative.     Allergies  Donepezil;  Heparin; Hydralazine; Hydromorphone; Morphine; Plavix; Promethazine; Sulfonamide derivatives; Cefuroxime; Metoprolol; and Ertapenem  Home Medications   Prior to Admission medications   Medication Sig Start Date End Date Taking? Authorizing Provider  acetaminophen (TYLENOL) 650 MG CR tablet Take 1,300 mg by mouth 2 (two) times daily.   Yes Historical Provider, MD  albuterol (PROAIR HFA) 108 (90 BASE) MCG/ACT inhaler Inhale 2 puffs into the lungs daily as needed for wheezing or shortness of breath.   Yes Historical Provider, MD  albuterol (PROVENTIL) (2.5 MG/3ML) 0.083% nebulizer solution  Take 2.5 mg by nebulization 3 (three) times daily.    Yes Historical Provider, MD  allopurinol (ZYLOPRIM) 300 MG tablet Take 300 mg by mouth 2 (two) times daily.   Yes Historical Provider, MD  amLODipine (NORVASC) 10 MG tablet Take 10 mg by mouth daily.   Yes Historical Provider, MD  Artificial Tear Ointment (DRY EYES OP) Place 2 drops into both eyes daily as needed (for dry eyes).   Yes Historical Provider, MD  aspirin EC 325 MG tablet Take 1 tablet (325 mg total) by mouth daily. 03/19/13  Yes Marianne L York, PA-C  atorvastatin (LIPITOR) 20 MG tablet Take 20 mg by mouth daily.    Yes Historical Provider, MD  B-D UF III MINI PEN NEEDLES 31G X 5 MM MISC 1 each by Other route See admin instructions. Use with insulin pens daily. 03/14/13  Yes Historical Provider, MD  cloNIDine HCl (KAPVAY) 0.1 MG TB12 ER tablet Take 1 tablet (0.1 mg total) by mouth 2 (two) times daily. 12/11/11  Yes Tarri Fuller, PA-C  colchicine 0.6 MG tablet Take 0.6 mg by mouth 2 (two) times daily.   Yes Historical Provider, MD  escitalopram (LEXAPRO) 20 MG tablet Take 20 mg by mouth daily.   Yes Historical Provider, MD  ezetimibe (ZETIA) 10 MG tablet Take 10 mg by mouth daily.    Yes Historical Provider, MD  folic acid (FOLVITE) 332 MCG tablet Take 1,200 mcg by mouth daily.    Yes Historical Provider, MD  furosemide (LASIX) 80 MG tablet Take 1  tablet (80 mg total) by mouth 2 (two) times daily. 06/29/12  Yes Carole Civil, MD  gabapentin (NEURONTIN) 100 MG tablet Take 100 mg by mouth at bedtime.    Yes Historical Provider, MD  guaifenesin (ROBITUSSIN) 100 MG/5ML syrup Take 200 mg by mouth 3 (three) times daily as needed for cough.   Yes Historical Provider, MD  insulin aspart (NOVOLOG) 100 UNIT/ML injection Inject 20 Units into the skin 3 (three) times daily as needed for high blood sugar.    Yes Historical Provider, MD  insulin glargine (LANTUS) 100 UNIT/ML injection Inject 62 Units into the skin 2 (two) times daily.   Yes Historical Provider, MD  isosorbide mononitrate (IMDUR) 60 MG 24 hr tablet Take 60 mg by mouth daily.   Yes Historical Provider, MD  metolazone (ZAROXOLYN) 2.5 MG tablet Take 2.5 mg by mouth See admin instructions. Take 30 minutes prior to morning furosemide dose for a weight gain of 3 lbs or more as needed   Yes Historical Provider, MD  Multiple Vitamin (MULTIVITAMIN WITH MINERALS) TABS Take 0.5 tablets by mouth 2 (two) times daily.   Yes Historical Provider, MD  nitroGLYCERIN (NITROSTAT) 0.4 MG SL tablet Place 0.4 mg under the tongue every 5 (five) minutes as needed for chest pain. May repeat up to 3 times   Yes Historical Provider, MD  ONE TOUCH ULTRA TEST test strip 1 each by Other route See admin instructions. Check blood sugar 4 times daily. 03/22/13  Yes Historical Provider, MD  pantoprazole (PROTONIX) 40 MG tablet Take 40 mg by mouth daily.    Yes Historical Provider, MD  predniSONE (DELTASONE) 5 MG tablet Take 5 mg by mouth 2 (two) times daily.  11/07/12  Yes Historical Provider, MD  tiotropium (SPIRIVA) 18 MCG inhalation capsule Place 18 mcg into inhaler and inhale at bedtime.   Yes Historical Provider, MD   BP 143/50  Pulse 62  Temp(Src) 98.3 F (36.8 C) (Oral)  Resp 25  SpO2 93%  Physical Exam  Nursing note and vitals reviewed. Constitutional: He is oriented to person, place, and time. He appears  well-developed and well-nourished. No distress.  Elderly, obese, NAD  HENT:  Head: Normocephalic and atraumatic.  Mouth/Throat: Oropharynx is clear and moist.  Eyes: Conjunctivae and EOM are normal. Pupils are equal, round, and reactive to light.  Neck: Normal range of motion. Neck supple.  Cardiovascular: Normal rate, regular rhythm and normal heart sounds.   Pulmonary/Chest: Effort normal and breath sounds normal. No respiratory distress. He has no wheezes.  Abdominal: Soft. Bowel sounds are normal. There is no tenderness. There is no guarding.  Musculoskeletal: Normal range of motion. He exhibits no edema.  Neurological: He is alert and oriented to person, place, and time.  Skin: Skin is warm and dry. He is not diaphoretic.  Psychiatric: He has a normal mood and affect.    ED Course  Procedures (including critical care time) Labs Review Labs Reviewed  CBC WITH DIFFERENTIAL - Abnormal; Notable for the following:    RBC 3.16 (*)    Hemoglobin 9.0 (*)    HCT 29.2 (*)    RDW 20.8 (*)    Neutro Abs 7.8 (*)    All other components within normal limits  COMPREHENSIVE METABOLIC PANEL - Abnormal; Notable for the following:    Chloride 95 (*)    Glucose, Bld 320 (*)    BUN 64 (*)    Creatinine, Ser 2.13 (*)    GFR calc non Af Amer 28 (*)    GFR calc Af Amer 32 (*)    All other components within normal limits  PRO B NATRIURETIC PEPTIDE - Abnormal; Notable for the following:    Pro B Natriuretic peptide (BNP) 1685.0 (*)    All other components within normal limits  URINALYSIS, ROUTINE W REFLEX MICROSCOPIC  I-STAT TROPOININ, ED    Imaging Review Dg Chest 2 View  12/11/2013   CLINICAL DATA:  Chest pain  EXAM: CHEST  2 VIEW  COMPARISON:  Chest radiograph 10/14/2013.  FINDINGS: Status post median sternotomy. Stable enlarged cardiac and mediastinal contours. No significant interval change bilateral coarse interstitial pulmonary opacities. No superimposed consolidative pulmonary opacity.  No pleural effusion or pneumothorax.  IMPRESSION: Cardiomegaly.  No acute superimposed process.   Electronically Signed   By: Lovey Newcomer M.D.   On: 12/11/2013 12:25     EKG Interpretation None      MDM   Final diagnoses:  Chest pain, unspecified chest pain type   80 y.o. M with extensive cardiac hx, presenting with CP onset 0930 this am.  Has been intermittent since then, relieved with NTG.  On exam, pt is afebrile and non-toxic appearing.  NAD noted.  States he has a mild discomfort still in his chest but not as severe as before.  Will obtain EKG, labs, trop, CXR.  Nitro paste applied.  EKG sinus rhythm without ischemic change. Chest x-ray is clear. Troponin is negative. Labs are reassuring-- slight elevation of BNP but close to baseline.  All chest discomfort has resolved after application of nitro paste.  Given patients known cardiac hx, have discussed case with cardiology.  They agreed to evaluate pt in the ED and determine plan of care.  Larene Pickett, PA-C 12/11/13 1558

## 2013-12-11 NOTE — ED Notes (Signed)
EMS brought pt with chest pains.  Pt stated he felt a squeezing pain in his central chest radiating to his back and left arm.  He stated pain was 10/10.  Pt took 2 nitro and 324mg  aspirin

## 2013-12-11 NOTE — H&P (Signed)
Cardiologist:  [name] is an 80 y.o. male.   Chief Complaint: Chest Pain HPI:   80 year old gentleman presenting for followup evaluation. He has extensive cardiovascular disease. The patient had CABG in 1997. He's undergone multiple PCI procedures. He's had contrast-induced nephropathy in the past requiring short-term hemodialysis. He also has peripheral arterial disease and congestive heart failure. He has undergone multiple iliac stenting procedures, including treatment with covered stents. Medical therapy has been recommended for further treatment of his occlusive vascular disease. His last ABIs are 0.75 on the right and 0.80 on the left. Velocities demonstrated severe bilateral iliac disease as well as common femoral arterial disease.   He was hospitalized with pneumonia from May 23 to 28, 2015 and sustained a non-ST elevation infarction, presumably a type II event. His blood pressure spiked and at that point he developed severe substernal chest pain. He ultimately ruled in for non-ST elevation infarction. A Myoview scan was performed and demonstrated no ischemia and medical therapy was recommended.   He has since had an echocardiogram which revealed a normal EF, mod LA dilation, peak PA pressure of 80mHg, increased septal thickness.    He presents today with chest pain which started this morning at 0930hrs and radiated to his scapula.  +nausea and "a little SOB".  It was 10/10 and felt like someone squeezing his heart.  NTG at home appeared to help.  He got additional NTG from EMS and is on NTG paste currently and pain free.  He was transfuse a unit of PRBCs this past Friday.  He has black stool.   Wt increase so he took metolazone and it decreased by three pounds.  The patient currently denies vomiting, fever, orthopnea, dizziness, PND, cough, congestion, abdominal pain, hematochezia,  lower extremity edema.      Past Medical History  Diagnosis Date  . Adenomatous colon polyp    . Diverticulosis   . GERD (gastroesophageal reflux disease)   . Hiatal hernia   . Alzheimer disease   . Gout     "hands; backbone; elbows; shoulders"  . Asthma with bronchitis   . COPD (chronic obstructive pulmonary disease)   . Chronic hypoxemic respiratory failure   . CHF (congestive heart failure)     grade 2 diastolic dysfunction, EF 678-24%on ECHO 12/2011  . Blood transfusion     "I've had about 6 pints; related to a GI bleed; colon bleeding"   . Anemia   . GI bleed     GI Dr. PHenrene Pastor   . Hypertension   . Peripheral vascular disease     PAD  . Dysrhythmia     "skips"  . Anginal pain   . Myocardial infarction     "they said I had 2 light ones"  . Shortness of breath 12/31/2011    "all the time"  . Type II diabetes mellitus     with complications:  retinopathy, nephrophathy.   . Arthritis     "plenty"  . Chronic lower back pain   . Renal insufficiency     "went on dialysis probably 3 times" Last 04/2011  . Skin cancer     "forehead; arms"  . Depression   . Diverticulosis   . Colon polyps     adenomatous and hyperplastic  . GERD (gastroesophageal reflux disease)   . Hiatal hernia   . Memory deficits 01/11/2013  . Obesity   . Carotid bruit     bilateral  . CKD (chronic kidney  disease) 03/07/2013  . Hemorrhoids   . AVM (arteriovenous malformation) of colon   . Gastric polyp   . Atrial fibrillation   . High cholesterol   . Pneumonia     "have had it several times"  . Chronic bronchitis   . OSA on CPAP   . On home oxygen therapy     "3L just at night" (10/16/2013)    Past Surgical History  Procedure Laterality Date  . Hand surgery Left 1990s    "chain saw accident; had to reattach fingers on left hand"  . Esophagogastroduodenoscopy  05/05/2011    Procedure: ESOPHAGOGASTRODUODENOSCOPY (EGD);  Surgeon: Inda Castle, MD;  Location: [facility];  Service: Endoscopy;  Laterality: N/A;  . Flexible sigmoidoscopy  05/05/2011    Procedure: FLEXIBLE  SIGMOIDOSCOPY;  Surgeon: Inda Castle, MD;  Location: [facility];  Service: Endoscopy;  Laterality: N/A;  . Laparoscopic cholecystectomy  ~ 2001  . Excisional hemorrhoidectomy  1950's  . Cataract extraction w/ intraocular lens  implant, bilateral Bilateral   . Coronary artery bypass graft  1997    CABG X2 vessels  . Iliac artery stent  11/2011    left/H&P  . Renal artery stent  2010    left/H&P  . Iliac artery stent  11/2011    right  . Coronary angioplasty with stent placement  12/31/2011    "I have 15 stents; heart, kidney, aorta, legs"  . Colonoscopy w/ biopsies and polypectomy      "have had probably 5 cut out" (12/31/2011)  . Enteroscopy  03/04/2012    Procedure: ENTEROSCOPY;  Surgeon: Inda Castle, MD;  Location: [place];  Service: Endoscopy;  Laterality: N/A;  jessica/leone  . Esophagogastroduodenoscopy N/A 03/04/2013    Procedure: ESOPHAGOGASTRODUODENOSCOPY (EGD);  Surgeon: Inda Castle, MD;  Location: [place];  Service: Endoscopy;  Laterality: N/A;  . Colonoscopy N/A 03/05/2013    Procedure: COLONOSCOPY;  Surgeon: Inda Castle, MD;  Location: [place];  Service: Endoscopy;  Laterality: N/A;    Family History  Problem Relation Age of Onset  . Lung cancer Brother   . Diabetes Mother   . Arthritis Mother   . Heart attack Mother   . Heart disease Sister   . Stomach cancer Maternal Grandfather   . Heart disease Father   . Heart attack Father   . Heart disease Brother   . ALS Sister   . ALS      nephew/neice  . Colon cancer Neg Hx    Social History:  reports that he quit smoking about 23 years ago. His smoking use included Cigarettes. He has a 112.5 pack-year smoking history. He quit smokeless tobacco use about 23 years ago. His smokeless tobacco use included Chew. He reports that he does not drink alcohol or use illicit drugs.  Allergies:  Allergies  Allergen Reactions  . Donepezil Diarrhea  . Heparin Rash and Other (See Comments)    Family  stated it almost killed him. Hallucinations and itching.  Marland Kitchen Hydralazine Other (See Comments)    DO NOT TAKE PER MD  . Hydromorphone Itching  . Morphine Itching  . Plavix [Clopidogrel Bisulfate] Other (See Comments)    GI BLEEDING; "so bad I had to take blood; dr took me off it"  . Promethazine Other (See Comments)    respiratory failure  . Sulfonamide Derivatives Itching  . Cefuroxime Other (See Comments)     unkown reaction  . Metoprolol Other (See Comments)    unknown  .  Ertapenem Itching and Rash    12/31/2011 pt does not recall allergy or severity     (Not in a hospital admission)  Results for orders placed during the hospital encounter of 12/11/13 (from the past 48 hour(s))  CBC WITH DIFFERENTIAL     Status: Abnormal   Collection Time    12/11/13 12:35 PM      Result Value Ref Range   WBC 10.2  4.0 - 10.5 K/uL   RBC 3.16 (*) 4.22 - 5.81 MIL/uL   Hemoglobin 9.0 (*) 13.0 - 17.0 g/dL   HCT 29.2 (*) 39.0 - 52.0 %   MCV 92.4  78.0 - 100.0 fL   MCH 28.5  26.0 - 34.0 pg   MCHC 30.8  30.0 - 36.0 g/dL   RDW 20.8 (*) 11.5 - 15.5 %   Platelets 154  150 - 400 K/uL   Neutrophils Relative % 77  43 - 77 %   Neutro Abs 7.8 (*) 1.7 - 7.7 K/uL   Lymphocytes Relative 12  12 - 46 %   Lymphs Abs 1.2  0.7 - 4.0 K/uL   Monocytes Relative 10  3 - 12 %   Monocytes Absolute 1.0  0.1 - 1.0 K/uL   Eosinophils Relative 2  0 - 5 %   Eosinophils Absolute 0.2  0.0 - 0.7 K/uL   Basophils Relative 0  0 - 1 %   Basophils Absolute 0.0  0.0 - 0.1 K/uL  COMPREHENSIVE METABOLIC PANEL     Status: Abnormal   Collection Time    12/11/13 12:35 PM      Result Value Ref Range   Sodium 139  137 - 147 mEq/L   Potassium 4.0  3.7 - 5.3 mEq/L   Chloride 95 (*) 96 - 112 mEq/L   CO2 30  19 - 32 mEq/L   Glucose, Bld 320 (*) 70 - 99 mg/dL   BUN 64 (*) 6 - 23 mg/dL   Creatinine, Ser 2.13 (*) 0.50 - 1.35 mg/dL   Calcium 9.4  8.4 - 10.5 mg/dL   Total Protein 6.9  6.0 - 8.3 g/dL   Albumin 3.5  3.5 - 5.2 g/dL    AST 20  0 - 37 U/L   ALT 19  0 - 53 U/L   Alkaline Phosphatase 82  39 - 117 U/L   Total Bilirubin 0.5  0.3 - 1.2 mg/dL   GFR calc non Af Amer 28 (*) >90 mL/min   GFR calc Af Amer 32 (*) >90 mL/min   Comment: (NOTE)     The eGFR has been calculated using the CKD EPI equation.     This calculation has not been validated in all clinical situations.     eGFR's persistently <90 mL/min signify possible Chronic Kidney     Disease.   Anion gap 14  5 - 15  PRO B NATRIURETIC PEPTIDE     Status: Abnormal   Collection Time    12/11/13 12:35 PM      Result Value Ref Range   Pro B Natriuretic peptide (BNP) 1685.0 (*) 0 - 450 pg/mL  I-STAT TROPOININ, ED     Status: None   Collection Time    12/11/13 12:45 PM      Result Value Ref Range   Troponin i, poc 0.08  0.00 - 0.08 ng/mL   Comment 3            Comment: Due to the release kinetics of cTnI,  a negative result within the first hours     of the onset of symptoms does not rule out     myocardial infarction with certainty.     If myocardial infarction is still suspected,     repeat the test at appropriate intervals.   Dg Chest 2 View  12/11/2013   CLINICAL DATA:  Chest pain  EXAM: CHEST  2 VIEW  COMPARISON:  Chest radiograph 10/14/2013.  FINDINGS: Status post median sternotomy. Stable enlarged cardiac and mediastinal contours. No significant interval change bilateral coarse interstitial pulmonary opacities. No superimposed consolidative pulmonary opacity. No pleural effusion or pneumothorax.  IMPRESSION: Cardiomegaly.  No acute superimposed process.   Electronically Signed   By: Lovey Newcomer M.D.   On: 12/11/2013 12:25    Review of Systems  Constitutional: Negative for fever and diaphoresis.  HENT: Negative for congestion and sore throat.   Respiratory: Positive for shortness of breath ("a Little"). Negative for cough.   Cardiovascular: Positive for chest pain. Negative for orthopnea, leg swelling and PND.  Gastrointestinal: Positive for  nausea and melena (Dark/black stool). Negative for vomiting, abdominal pain and blood in stool.  Genitourinary: Negative for hematuria.  Musculoskeletal: Positive for back pain.  Neurological: Negative for dizziness.  All other systems reviewed and are negative.   Blood pressure 141/85, pulse 68, temperature 98.3 F (36.8 C), temperature source Oral, resp. rate 20, SpO2 93.00%. Physical Exam  Nursing note and vitals reviewed. Constitutional: He is oriented to person, place, and time. He appears well-developed. No distress.  Obese  HENT:  Head: Normocephalic and atraumatic.  Mouth/Throat: No oropharyngeal exudate.  Eyes: EOM are normal. Pupils are equal, round, and reactive to light. No scleral icterus.  Neck: Normal range of motion. Neck supple.  Cardiovascular: Normal rate, regular rhythm, S1 normal and S2 normal.   Murmur heard.  Systolic murmur is present with a grade of 2/6  Pulses:      Radial pulses are 2+ on the right side, and 2+ on the left side.       Dorsalis pedis pulses are 1+ on the right side, and 1+ on the left side.  Bilateral carotid bruit are likely radiation from AS MM  Respiratory: Effort normal and breath sounds normal. He has no wheezes. He has no rales.  GI: Bowel sounds are normal. He exhibits distension. There is no tenderness.  Tense abdomen  Musculoskeletal: He exhibits no edema.  Lymphadenopathy:    He has no cervical adenopathy.  Neurological: He is alert and oriented to person, place, and time. He exhibits normal muscle tone.  Skin: Skin is warm and dry.  Psychiatric: He has a normal mood and affect.     Assessment/Plan Principal Problem:   Chest pain Initial trop negative.  Admit for obs.  Check serial troponin.  Recent negative myovue and echo with normal EF.  DC home tomorrow if he rules out. Continue imdur. Consider increasing if pain returns.    Active Problems:   DM  SS and lantus    HTN (hypertension)  Continue home BP meds   HLD  (hyperlipidemia)  Lipitor   Obesity   Chronic renal insufficiency, stage III (moderate) - Baseline creatinine roughly 1.6  Monitor tomorrow   CAD (coronary artery disease)    HAGER, BRYAN, PA-C 12/11/2013, 4:10 PM  Attending Note:   The patient was seen and examined on the day of admission.  Agree with assessment and plan as noted above.  Changes made to the above note  as needed.  Patient has known CAD.  Presents with CP .   Will collect serial enzymes.  He had a negative myoview in May.  ECG is unchanged.    I would observe,  He can probably go home if evaulation is normal  Thayer Headings, Brooke Bonito., MD, [facility] 12/12/2013, 10:14 AM

## 2013-12-11 NOTE — ED Notes (Signed)
Pt states he is pain free. Pt sitting in bed eating subway sandwich. States that PA told him he could eat it.

## 2013-12-11 NOTE — ED Notes (Signed)
Pt states his chest pain has started again- squeezing pain. 7/10. Notified PA. Will place nitro paste per order

## 2013-12-12 DIAGNOSIS — R079 Chest pain, unspecified: Secondary | ICD-10-CM

## 2013-12-12 LAB — MRSA PCR SCREENING: MRSA by PCR: POSITIVE — AB

## 2013-12-12 LAB — TROPONIN I
Troponin I: <0.3 ng/mL (ref <0.30)
Troponin I: <0.3 ng/mL (ref <0.30)

## 2013-12-12 LAB — GLUCOSE, CAPILLARY
Glucose-Capillary: 142 mg/dL — ABNORMAL HIGH (ref 70–99)
Glucose-Capillary: 132 mg/dL — ABNORMAL HIGH (ref 70–99)

## 2013-12-12 MED ORDER — MUPIROCIN 2 % EX OINT
1.0000 "application " | TOPICAL_OINTMENT | Freq: Two times a day (BID) | CUTANEOUS | Status: DC
  Filled 2013-12-12: qty 22

## 2013-12-12 MED ORDER — CHLORHEXIDINE GLUCONATE CLOTH 2 % EX PADS
6.0000 | MEDICATED_PAD | Freq: Every day | CUTANEOUS | Status: DC
  Administered 2013-12-12: 6 via TOPICAL

## 2013-12-12 NOTE — Plan of Care (Signed)
Problem: Phase I Progression Outcomes Goal: Aspirin unless contraindicated Outcome: Completed/Met Date Met:  12/12/13 Pt took $Remov'324mg'[id number]$  of ASA at home prior to arrival per pt.

## 2013-12-12 NOTE — Progress Notes (Signed)
Patient Name: [name] Date of Encounter: 12/12/2013     Principal Problem:   Chest pain Active Problems:   DM   HTN (hypertension)   HLD (hyperlipidemia)   Obesity   Chronic renal insufficiency, stage III (moderate) - Baseline creatinine roughly 1.6   CAD (coronary artery disease)    SUBJECTIVE  Denies any chest pain or SOB. States his CP lasted about 1 hr yesterday, resolved after 3 nitroglycerine and nitro patch. CP occurred during a breathing treatment last night. He states he does not walk around even at home, therefor not sure if CP worsen with exertion. He states after waking up, he usually sit on a chair and fall asleep that way.   CURRENT MEDS . albuterol  2.5 mg Nebulization TID  . allopurinol  300 mg Oral BID  . amLODipine  10 mg Oral Daily  . aspirin EC  325 mg Oral Daily  . atorvastatin  20 mg Oral Daily  . Chlorhexidine Gluconate Cloth  6 each Topical Q0600  . cloNIDine  0.1 mg Oral TID  . colchicine  0.6 mg Oral BID  . escitalopram  20 mg Oral Daily  . ezetimibe  10 mg Oral Daily  . furosemide  80 mg Oral BID  . gabapentin  100 mg Oral QHS  . insulin aspart  0-20 Units Subcutaneous TID WC  . insulin glargine  62 Units Subcutaneous BID  . isosorbide mononitrate  60 mg Oral Daily  . mupirocin ointment  1 application Nasal BID  . pantoprazole  40 mg Oral Daily  . predniSONE  5 mg Oral BID  . tiotropium  18 mcg Inhalation QHS    OBJECTIVE  Filed Vitals:   12/11/13 2031 12/11/13 2100 12/11/13 2126 12/12/13 0500  BP:  153/45  147/44  Pulse:  65  68  Temp:  98.2 F (36.8 C)  99.8 F (37.7 C)  TempSrc:      Resp:  18  18  Weight:   233 lb 12.8 oz (106.051 kg) 229 lb 11.2 oz (104.191 kg)  SpO2: 96% 95%  97%    Intake/Output Summary (Last 24 hours) at 12/12/13 1740 Last data filed at 12/12/13 0600  Gross per 24 hour  Intake    120 ml  Output    700 ml  Net   -580 ml   Filed Weights   12/11/13 2126 12/12/13 0500  Weight: 233 lb 12.8 oz  (106.051 kg) 229 lb 11.2 oz (104.191 kg)    PHYSICAL EXAM  General: Pleasant, NAD. Neuro: Alert and oriented X 3. Moves all extremities spontaneously. Psych: Normal affect. HEENT:  Normal  Neck: Supple without bruits or JVD. Lungs:  Resp regular and unlabored, CTA. Heart: RRR no s3, s4, or murmurs. Abdomen: Soft, non-tender, non-distended, BS + x 4.  Extremities: No clubbing, cyanosis or edema. DP/PT/Radials 2+ and equal bilaterally.  Accessory Clinical Findings  CBC  Recent Labs  12/11/13 1235  WBC 10.2  NEUTROABS 7.8*  HGB 9.0*  HCT 29.2*  MCV 92.4  PLT 814   Basic Metabolic Panel  Recent Labs  12/11/13 1235  NA 139  K 4.0  CL 95*  CO2 30  GLUCOSE 320*  BUN 64*  CREATININE 2.13*  CALCIUM 9.4   Liver Function Tests  Recent Labs  12/11/13 1235  AST 20  ALT 19  ALKPHOS 82  BILITOT 0.5  PROT 6.9  ALBUMIN 3.5   Cardiac Enzymes  Recent Labs  12/11/13 2002 12/12/13 0044  12/12/13 0728  TROPONINI <0.30 <0.30 <0.30    TELE  NSR with HR 70s, frequent PAC and PVCs causing rhythm appears to be abnormal and misread as a-fib.   ECG  NSR with HR 70s, minimal ST depression in lateral leads  Radiology/Studies  Dg Chest 2 View  12/11/2013   CLINICAL DATA:  Chest pain  EXAM: CHEST  2 VIEW  COMPARISON:  Chest radiograph 10/14/2013.  FINDINGS: Status post median sternotomy. Stable enlarged cardiac and mediastinal contours. No significant interval change bilateral coarse interstitial pulmonary opacities. No superimposed consolidative pulmonary opacity. No pleural effusion or pneumothorax.  IMPRESSION: Cardiomegaly.  No acute superimposed process.   Electronically Signed   By: Lovey Newcomer M.D.   On: 12/11/2013 12:25    ASSESSMENT AND PLAN  1. CP  - similar to previous MI, however with recent negative myoview in May, unchanged EKG and negative troponin  - h/o contrast induced nephropathy requiring temp dialysis, currently CKD stage IV with Cr 2.13  - would  prefer to avoid invasive workup with his alzheimer, risk for fall, anemia with recent transfusion and severe CKD with h/o HD  - plan to trend enzyme, currently negative x2, if continue to be negative, plan for discharge  2. CAD  - CABG 1997, multiple PCI procedure  - admitted for pneumonia in May, NSTEMI at the time, presumed type II  - myoview during May admission, calculated EF 46%, no reversible ischemia  - Echo 11/29/2013 EF 55-60%, calcified mitral valve, PA pressure 42 mmHg, lipomatous hypertrophy of atrial septum   3. Anemia: transfused last week 4. H/o GI bleed 5. PAD 6. COPD on home O2 7. DM 8. CKD stage IV, h/o contrast induced nephropathy requiring temp HD 9. H/o a-fib 10. OSA on CPAP    Signed, Almyra Deforest PA-C Pager: [phone]  History and all data above reviewed.  Patient examined.  I agree with the findings as above.  No further chest pain.  He reports that he otherwise feels well.  Wants to go home. The patient exam reveals COR:RRR  ,  Lungs: Clear  ,  Abd: Positive bowel sounds, no rebound no guarding, Ext No edema  .  All available labs, radiology testing, previous records reviewed. Agree with documented assessment and plan. Chest pain without objective evidence of ischemia.  OK to send home.  Increase Imdur to 120 mg.    Minus Breeding  9:10 AM  12/12/2013

## 2013-12-12 NOTE — Progress Notes (Signed)
Utilization review completed.  

## 2013-12-12 NOTE — Progress Notes (Signed)
Reviewed discharge instructions with patient and wife, they stated their understanding.  Discharged home with wife via wheelchair with volunteers.  Wife stated patient would be ok without oxygen transport home, states he wears it at prn at home.  Sanda Linger

## 2013-12-12 NOTE — Discharge Summary (Signed)
Discharge Summary   Patient ID: [name],  MRN: [id number], DOB/AGE: [birth year] 78 y.o.  Admit date: 12/11/2013 Discharge date: 12/12/2013  Primary Care Provider: [name] Primary Cardiologist: Dr. Burt Knack  Discharge Diagnoses Principal Problem:   Chest pain Active Problems:   DM   HTN (hypertension)   HLD (hyperlipidemia)   Obesity   Chronic renal insufficiency, stage III (moderate) - Baseline creatinine roughly 1.6   CAD (coronary artery disease)   Allergies Allergies  Allergen Reactions  . Donepezil Diarrhea  . Heparin Rash and Other (See Comments)    Family stated it almost killed him. Hallucinations and itching.  Marland Kitchen Hydralazine Other (See Comments)    DO NOT TAKE PER MD  . Hydromorphone Itching  . Morphine Itching  . Plavix [Clopidogrel Bisulfate] Other (See Comments)    GI BLEEDING; "so bad I had to take blood; dr took me off it"  . Promethazine Other (See Comments)    respiratory failure  . Sulfonamide Derivatives Itching  . Cefuroxime Other (See Comments)     unkown reaction  . Metoprolol Other (See Comments)    unknown  . Ertapenem Itching and Rash    12/31/2011 pt does not recall allergy or severity    Hospital Course  The patient is a 80 year old gentleman with extensive past medical history of coronary vascular disease status post CABG in 1997. He has also undergone multiple PCI procedures in the past. He has stage 3/4 chronic kidney disease and a history of contrast-induced nephropathy in the past requiring short-term hemodialysis. He has a extensive history of peripheral arterial disease. He was recently hospitalized in May for pneumonia during which hospitalization was noted he has elevated troponin which was presumed demand ischemia. A Myoview was obtained which showed no sign of ischemia. A followup echocardiogram also revealed normal EF with peak PA pressure of 42 mmHg.   He presented to [facility] on 12/11/2013 with chest pain  started around 9:30 in the morning radiating to his scapula. He also claimed to have some nausea and shortness of breath as well. The chest pain went away after a hour with 3 nitroglycerin and nitro paste. Of note, he was recently transfused with a unit of packed red blood cells this past Friday and he has been having black stools. He also noted to have a history of GI bleed. Given the recent negative Myoview, the short duration of his symptom, and a significant comorbidities including chronic kidney disease and history of GI bleed with anemia, patient was admitted for observation overnight.  Patient was seen the morning of 12/12/2013, serial troponin has been negative overnight. Hemoglobin stable at 9.0. EKG showed no significant changes. Patient denies any chest discomfort or shortness breath overnight as well. He is deemed stable for discharge from cardiology perspective. I have scheduled a followup with Dr. Antionette Char office in a month. Patient has been advised to seek medical attention if has recurrence of chest discomfort lasting greater than 20 minutes at a time, shortness breath, diaphoresis, or dizziness. However given his significant comorbidities, we would like to avoid invasive procedure if possible unless he is ruled in for ACS.    Discharge Vitals Blood pressure 147/44, pulse 68, temperature 99.8 F (37.7 C), temperature source Oral, resp. rate 18, weight 229 lb 11.2 oz (104.191 kg), SpO2 97.00%.  Filed Weights   12/11/13 2126 12/12/13 0500  Weight: 233 lb 12.8 oz (106.051 kg) 229 lb 11.2 oz (104.191 kg)    Labs  CBC  Recent Labs  12/11/13 1235  WBC 10.2  NEUTROABS 7.8*  HGB 9.0*  HCT 29.2*  MCV 92.4  PLT 270   Basic Metabolic Panel  Recent Labs  12/11/13 1235  NA 139  K 4.0  CL 95*  CO2 30  GLUCOSE 320*  BUN 64*  CREATININE 2.13*  CALCIUM 9.4   Liver Function Tests  Recent Labs  12/11/13 1235  AST 20  ALT 19  ALKPHOS 82  BILITOT 0.5  PROT 6.9  ALBUMIN  3.5   Cardiac Enzymes  Recent Labs  12/11/13 2002 12/12/13 0044 12/12/13 0728  TROPONINI <0.30 <0.30 <0.30    Disposition  Pt is being discharged home today in good condition.  Follow-up Plans & Appointments      Follow-up Information   Follow up with Richardson Dopp, PA-C On 01/15/2014. (11:50am)    Specialty:  Physician Assistant   Contact information:   [address] [address] [phone]       Discharge Medications    Medication List         acetaminophen 650 MG CR tablet  Commonly known as:  TYLENOL  Take 1,300 mg by mouth 2 (two) times daily.     allopurinol 300 MG tablet  Commonly known as:  ZYLOPRIM  Take 300 mg by mouth 2 (two) times daily.     amLODipine 10 MG tablet  Commonly known as:  NORVASC  Take 10 mg by mouth daily.     aspirin EC 325 MG tablet  Take 1 tablet (325 mg total) by mouth daily.     atorvastatin 20 MG tablet  Commonly known as:  LIPITOR  Take 20 mg by mouth daily.     B-D UF III MINI PEN NEEDLES 31G X 5 MM Misc  Generic drug:  Insulin Pen Needle  1 each by Other route See admin instructions. Use with insulin pens daily.     cloNIDine HCl 0.1 MG Tb12 ER tablet  Commonly known as:  KAPVAY  Take 1 tablet (0.1 mg total) by mouth 2 (two) times daily.     colchicine 0.6 MG tablet  Take 0.6 mg by mouth 2 (two) times daily.     DRY EYES OP  Place 2 drops into both eyes daily as needed (for dry eyes).     escitalopram 20 MG tablet  Commonly known as:  LEXAPRO  Take 20 mg by mouth daily.     ezetimibe 10 MG tablet  Commonly known as:  ZETIA  Take 10 mg by mouth daily.     folic acid 893 MCG tablet  Commonly known as:  FOLVITE  Take 1,200 mcg by mouth daily.     furosemide 80 MG tablet  Commonly known as:  LASIX  Take 1 tablet (80 mg total) by mouth 2 (two) times daily.     gabapentin 100 MG tablet  Commonly known as:  NEURONTIN  Take 100 mg by mouth at bedtime.     guaifenesin 100  MG/5ML syrup  Commonly known as:  ROBITUSSIN  Take 200 mg by mouth 3 (three) times daily as needed for cough.     insulin aspart 100 UNIT/ML injection  Commonly known as:  novoLOG  Inject 20 Units into the skin 3 (three) times daily as needed for high blood sugar.     insulin glargine 100 UNIT/ML injection  Commonly known as:  LANTUS  Inject 62 Units into the skin 2 (two) times daily.  isosorbide mononitrate 60 MG 24 hr tablet  Commonly known as:  IMDUR  Take 60 mg by mouth daily.     metolazone 2.5 MG tablet  Commonly known as:  ZAROXOLYN  Take 2.5 mg by mouth See admin instructions. Take 30 minutes prior to morning furosemide dose for a weight gain of 3 lbs or more as needed     multivitamin with minerals Tabs tablet  Take 0.5 tablets by mouth 2 (two) times daily.     nitroGLYCERIN 0.4 MG SL tablet  Commonly known as:  NITROSTAT  Place 0.4 mg under the tongue every 5 (five) minutes as needed for chest pain. May repeat up to 3 times     ONE TOUCH ULTRA TEST test strip  Generic drug:  glucose blood  1 each by Other route See admin instructions. Check blood sugar 4 times daily.     pantoprazole 40 MG tablet  Commonly known as:  PROTONIX  Take 40 mg by mouth daily.     predniSONE 5 MG tablet  Commonly known as:  DELTASONE  Take 5 mg by mouth 2 (two) times daily.     PROAIR HFA 108 (90 BASE) MCG/ACT inhaler  Generic drug:  albuterol  Inhale 2 puffs into the lungs daily as needed for wheezing or shortness of breath.     albuterol (2.5 MG/3ML) 0.083% nebulizer solution  Commonly known as:  PROVENTIL  Take 2.5 mg by nebulization 3 (three) times daily.     tiotropium 18 MCG inhalation capsule  Commonly known as:  SPIRIVA  Place 18 mcg into inhaler and inhale at bedtime.         Duration of Discharge Encounter   Greater than 30 minutes including physician time.  Hilbert Corrigan NP 12/12/2013, 11:24 AM  Patient seen and examined.  Plan as discussed in my  rounding note for today and outlined above. Jeneen Rinks [facility]  12/12/2013  11:33 AM

## 2013-12-12 NOTE — Discharge Instructions (Signed)
Chest Pain (Nonspecific) °It is often hard to give a specific diagnosis for the cause of chest pain. There is always a chance that your pain could be related to something serious, such as a heart attack or a blood clot in the lungs. You need to follow up with your health care provider for further evaluation. °CAUSES  °· Heartburn. °· Pneumonia or bronchitis. °· Anxiety or stress. °· Inflammation around your heart (pericarditis) or lung (pleuritis or pleurisy). °· A blood clot in the lung. °· A collapsed lung (pneumothorax). It can develop suddenly on its own (spontaneous pneumothorax) or from trauma to the chest. °· Shingles infection (herpes zoster virus). °The chest wall is composed of bones, muscles, and cartilage. Any of these can be the source of the pain. °· The bones can be bruised by injury. °· The muscles or cartilage can be strained by coughing or overwork. °· The cartilage can be affected by inflammation and become sore (costochondritis). °DIAGNOSIS  °Lab tests or other studies may be needed to find the cause of your pain. Your health care provider may have you take a test called an ambulatory electrocardiogram (ECG). An ECG records your heartbeat patterns over a 24-hour period. You may also have other tests, such as: °· Transthoracic echocardiogram (TTE). During echocardiography, sound waves are used to evaluate how blood flows through your heart. °· Transesophageal echocardiogram (TEE). °· Cardiac monitoring. This allows your health care provider to monitor your heart rate and rhythm in real time. °· Holter monitor. This is a portable device that records your heartbeat and can help diagnose heart arrhythmias. It allows your health care provider to track your heart activity for several days, if needed. °· Stress tests by exercise or by giving medicine that makes the heart beat faster. °TREATMENT  °· Treatment depends on what may be causing your chest pain. Treatment may include: °¨ Acid blockers for  heartburn. °¨ Anti-inflammatory medicine. °¨ Pain medicine for inflammatory conditions. °¨ Antibiotics if an infection is present. °· You may be advised to change lifestyle habits. This includes stopping smoking and avoiding alcohol, caffeine, and chocolate. °· You may be advised to keep your head raised (elevated) when sleeping. This reduces the chance of acid going backward from your stomach into your esophagus. °Most of the time, nonspecific chest pain will improve within 2-3 days with rest and mild pain medicine.  °HOME CARE INSTRUCTIONS  °· If antibiotics were prescribed, take them as directed. Finish them even if you start to feel better. °· For the next few days, avoid physical activities that bring on chest pain. Continue physical activities as directed. °· Do not use any tobacco products, including cigarettes, chewing tobacco, or electronic cigarettes. °· Avoid drinking alcohol. °· Only take medicine as directed by your health care provider. °· Follow your health care provider's suggestions for further testing if your chest pain does not go away. °· Keep any follow-up appointments you made. If you do not go to an appointment, you could develop lasting (chronic) problems with pain. If there is any problem keeping an appointment, call to reschedule. °SEEK MEDICAL CARE IF:  °· Your chest pain does not go away, even after treatment. °· You have a rash with blisters on your chest. °· You have a fever. °SEEK IMMEDIATE MEDICAL CARE IF:  °· You have increased chest pain or pain that spreads to your arm, neck, jaw, back, or abdomen. °· You have shortness of breath. °· You have an increasing cough, or you cough   up blood. °· You have severe back or abdominal pain. °· You feel nauseous or vomit. °· You have severe weakness. °· You faint. °· You have chills. °This is an emergency. Do not wait to see if the pain will go away. Get medical help at once. Call your local emergency services (911 in [place] Do not drive  yourself to the hospital. °MAKE SURE YOU:  °· Understand these instructions. °· Will watch your condition. °· Will get help right away if you are not doing well or get worse. °Document Released: 02/18/2005 Document Revised: 05/16/2013 Document Reviewed: 12/15/2007 °ExitCare® Patient Information ©2015 [employer]. This information is not intended to replace advice given to you by your health care provider. Make sure you discuss any questions you have with your health care provider. ° °Chest Pain Observation °It is often hard to give a specific diagnosis for the cause of chest pain. Among other possibilities your symptoms might be caused by inadequate oxygen delivery to your heart (angina). Angina that is not treated or evaluated can lead to a heart attack (myocardial infarction) or death. °Blood tests, electrocardiograms, and X-rays may have been done to help determine a possible cause of your chest pain. After evaluation and observation, your health care provider has determined that it is unlikely your pain was caused by an unstable condition that requires hospitalization. However, a full evaluation of your pain may need to be completed, with additional diagnostic testing as directed. It is very important to keep your follow-up appointments. Not keeping your follow-up appointments could result in permanent heart damage, disability, or death. If there is any problem keeping your follow-up appointments, you must call your health care provider. °HOME CARE INSTRUCTIONS  °Due to the slight chance that your pain could be angina, it is important to follow your health care provider's treatment plan and also maintain a healthy lifestyle: °· Maintain or work toward achieving a healthy weight. °· Stay physically active and exercise regularly. °· Decrease your salt intake. °· Eat a balanced, healthy diet. Talk to a dietitian to learn about heart-healthy foods. °· Increase your fiber intake by including whole grains,  vegetables, fruits, and nuts in your diet. °· Avoid situations that cause stress, anger, or depression. °· Take medicines as advised by your health care provider. Report any side effects to your health care provider. Do not stop medicines or adjust the dosages on your own. °· Quit smoking. Do not use nicotine patches or gum until you check with your health care provider. °· Keep your blood pressure, blood sugar, and cholesterol levels within normal limits. °· Limit alcohol intake to no more than 1 drink per day for women who are not pregnant and 2 drinks per day for men. °· Do not abuse drugs. °SEEK IMMEDIATE MEDICAL CARE IF: °You have severe chest pain or pressure which may include symptoms such as: °· You feel pain or pressure in your arms, neck, jaw, or back. °· You have severe back or abdominal pain, feel sick to your stomach (nauseous), or throw up (vomit). °· You are sweating profusely. °· You are having a fast or irregular heartbeat. °· You feel short of breath while at rest. °· You notice increasing shortness of breath during rest, sleep, or with activity. °· You have chest pain that does not get better after rest or after taking your usual medicine. °· You wake from sleep with chest pain. °· You are unable to sleep because you cannot breathe. °· You develop a frequent   cough or you are coughing up blood. °· You feel dizzy, faint, or experience extreme fatigue. °· You develop severe weakness, dizziness, fainting, or chills. °Any of these symptoms may represent a serious problem that is an emergency. Do not wait to see if the symptoms will go away. Call your local emergency services (911 in the [place] Do not drive yourself to the hospital. °MAKE SURE YOU: °· Understand these instructions. °· Will watch your condition. °· Will get help right away if you are not doing well or get worse. °Document Released: 06/13/2010 Document Revised: 05/16/2013 Document Reviewed: 11/10/2012 °ExitCare® Patient Information ©2015  [employer]. This information is not intended to replace advice given to you by your health care provider. Make sure you discuss any questions you have with your health care provider. ° °

## 2013-12-13 NOTE — ED Provider Notes (Signed)
Medical screening examination/treatment/procedure(s) were performed by non-physician practitioner and as supervising physician I was immediately available for consultation/collaboration.   EKG Interpretation None      Date: 12/13/2013  Rate: 66  Rhythm: normal sinus rhythm  QRS Axis: normal  Intervals: normal  ST/T Wave abnormalities: nonspecific T wave changes  Conduction Disutrbances:PAC  Narrative Interpretation:   Old EKG Reviewed: changes noted     Neta Ehlers, MD 12/13/13 1106

## 2013-12-14 ENCOUNTER — Other Ambulatory Visit: Payer: BC Managed Care – PPO

## 2013-12-14 ENCOUNTER — Telehealth: Payer: Self-pay | Admitting: *Deleted

## 2013-12-14 ENCOUNTER — Other Ambulatory Visit (HOSPITAL_BASED_OUTPATIENT_CLINIC_OR_DEPARTMENT_OTHER): Payer: BC Managed Care – PPO

## 2013-12-14 ENCOUNTER — Other Ambulatory Visit: Payer: Self-pay | Admitting: Hematology and Oncology

## 2013-12-14 DIAGNOSIS — K922 Gastrointestinal hemorrhage, unspecified: Secondary | ICD-10-CM

## 2013-12-14 DIAGNOSIS — D649 Anemia, unspecified: Secondary | ICD-10-CM

## 2013-12-14 DIAGNOSIS — D500 Iron deficiency anemia secondary to blood loss (chronic): Secondary | ICD-10-CM

## 2013-12-14 LAB — CBC & DIFF AND RETIC
BASO%: 0 % (ref 0.0–2.0)
Basophils Absolute: 0 10*3/uL (ref 0.0–0.1)
EOS ABS: 0.1 10*3/uL (ref 0.0–0.5)
EOS%: 1.5 % (ref 0.0–7.0)
HCT: 30.2 % — ABNORMAL LOW (ref 38.4–49.9)
HEMOGLOBIN: 9.2 g/dL — AB (ref 13.0–17.1)
Immature Retic Fract: 11.7 % — ABNORMAL HIGH (ref 3.00–10.60)
LYMPH%: 7.9 % — AB (ref 14.0–49.0)
MCH: 27.5 pg (ref 27.2–33.4)
MCHC: 30.5 g/dL — ABNORMAL LOW (ref 32.0–36.0)
MCV: 90.1 fL (ref 79.3–98.0)
MONO#: 0.7 10*3/uL (ref 0.1–0.9)
MONO%: 7.5 % (ref 0.0–14.0)
NEUT%: 83.1 % — ABNORMAL HIGH (ref 39.0–75.0)
NEUTROS ABS: 8 10*3/uL — AB (ref 1.5–6.5)
PLATELETS: 206 10*3/uL (ref 140–400)
RBC: 3.35 10*6/uL — ABNORMAL LOW (ref 4.20–5.82)
RDW: 19.8 % — ABNORMAL HIGH (ref 11.0–14.6)
Retic %: 3.5 % — ABNORMAL HIGH (ref 0.80–1.80)
Retic Ct Abs: 117.25 10*3/uL — ABNORMAL HIGH (ref 34.80–93.90)
WBC: 9.6 10*3/uL (ref 4.0–10.3)
lymph#: 0.8 10*3/uL — ABNORMAL LOW (ref 0.9–3.3)

## 2013-12-14 LAB — FERRITIN CHCC: FERRITIN: 79 ng/mL (ref 22–316)

## 2013-12-14 LAB — HOLD TUBE, BLOOD BANK

## 2013-12-14 NOTE — Telephone Encounter (Signed)
I placed POF for labs and iv iron

## 2013-12-14 NOTE — Telephone Encounter (Signed)
Informed pt and daughter in lobby of Hgb 9.2 today.  No need for transfusion. Repeat lab in 3 weeks.  They verbalized understanding.

## 2013-12-14 NOTE — Telephone Encounter (Signed)
Message copied by Cathlean Cower on Thu Dec 14, 2013  1:40 PM ------      Message from: [facility], Olivia Lopez de Gutierrez: Thu Dec 14, 2013  1:12 PM      Regarding: low iron       He needs another iron infusion. Can he come tomorrow?      ----- Message -----         From: [facility]         Sent: 12/14/2013  10:47 AM           To: Heath Lark, MD                   ------

## 2013-12-14 NOTE — Telephone Encounter (Signed)
Informed daughter of Ferritin still low and need for IV iron tomorrow at 9 am.  She will bring pt tomorrow for iron.

## 2013-12-15 ENCOUNTER — Ambulatory Visit (HOSPITAL_BASED_OUTPATIENT_CLINIC_OR_DEPARTMENT_OTHER): Payer: BC Managed Care – PPO

## 2013-12-15 ENCOUNTER — Telehealth: Payer: Self-pay | Admitting: *Deleted

## 2013-12-15 ENCOUNTER — Telehealth: Payer: Self-pay | Admitting: Hematology and Oncology

## 2013-12-15 VITALS — BP 154/44 | HR 66 | Temp 97.7°F | Resp 18

## 2013-12-15 DIAGNOSIS — D500 Iron deficiency anemia secondary to blood loss (chronic): Secondary | ICD-10-CM

## 2013-12-15 DIAGNOSIS — N189 Chronic kidney disease, unspecified: Secondary | ICD-10-CM

## 2013-12-15 DIAGNOSIS — K31811 Angiodysplasia of stomach and duodenum with bleeding: Secondary | ICD-10-CM

## 2013-12-15 MED ORDER — SODIUM CHLORIDE 0.9 % IV SOLN
Freq: Once | INTRAVENOUS | Status: AC
  Administered 2013-12-15: 09:00:00 via INTRAVENOUS

## 2013-12-15 MED ORDER — SODIUM CHLORIDE 0.9 % IV SOLN
1020.0000 mg | Freq: Once | INTRAVENOUS | Status: AC
  Administered 2013-12-15: 1020 mg via INTRAVENOUS
  Filled 2013-12-15: qty 34

## 2013-12-15 NOTE — Telephone Encounter (Signed)
Per POF staff message scheduled appts. Advised scheduler 

## 2013-12-15 NOTE — Telephone Encounter (Signed)
Per 07/23 POF scheduled labs/sent msg to [facility] to add chemo, cld pt and mailed updated schedule to pt...Marland KitchenMarland KitchenKJ

## 2013-12-15 NOTE — Patient Instructions (Signed)

## 2014-01-10 ENCOUNTER — Ambulatory Visit (HOSPITAL_BASED_OUTPATIENT_CLINIC_OR_DEPARTMENT_OTHER): Payer: BC Managed Care – PPO

## 2014-01-10 ENCOUNTER — Other Ambulatory Visit (HOSPITAL_BASED_OUTPATIENT_CLINIC_OR_DEPARTMENT_OTHER): Payer: BC Managed Care – PPO

## 2014-01-10 ENCOUNTER — Other Ambulatory Visit: Payer: Self-pay | Admitting: Hematology and Oncology

## 2014-01-10 VITALS — HR 63 | Temp 97.2°F | Resp 20

## 2014-01-10 DIAGNOSIS — K31811 Angiodysplasia of stomach and duodenum with bleeding: Secondary | ICD-10-CM

## 2014-01-10 DIAGNOSIS — D500 Iron deficiency anemia secondary to blood loss (chronic): Secondary | ICD-10-CM

## 2014-01-10 LAB — IRON AND TIBC CHCC
%SAT: 19 % — ABNORMAL LOW (ref 20–55)
Iron: 51 ug/dL (ref 42–163)
TIBC: 266 ug/dL (ref 202–409)
UIBC: 216 ug/dL (ref 117–376)

## 2014-01-10 LAB — CBC & DIFF AND RETIC
BASO%: 0.2 % (ref 0.0–2.0)
Basophils Absolute: 0 10*3/uL (ref 0.0–0.1)
EOS%: 4.5 % (ref 0.0–7.0)
Eosinophils Absolute: 0.4 10*3/uL (ref 0.0–0.5)
HCT: 30.2 % — ABNORMAL LOW (ref 38.4–49.9)
HEMOGLOBIN: 9.4 g/dL — AB (ref 13.0–17.1)
IMMATURE RETIC FRACT: 14.3 % — AB (ref 3.00–10.60)
LYMPH#: 1.4 10*3/uL (ref 0.9–3.3)
LYMPH%: 15.4 % (ref 14.0–49.0)
MCH: 29.3 pg (ref 27.2–33.4)
MCHC: 31.1 g/dL — ABNORMAL LOW (ref 32.0–36.0)
MCV: 94.1 fL (ref 79.3–98.0)
MONO#: 0.9 10*3/uL (ref 0.1–0.9)
MONO%: 9.9 % (ref 0.0–14.0)
NEUT#: 6.2 10*3/uL (ref 1.5–6.5)
NEUT%: 70 % (ref 39.0–75.0)
Platelets: 198 10*3/uL (ref 140–400)
RBC: 3.21 10*6/uL — ABNORMAL LOW (ref 4.20–5.82)
RDW: 20.8 % — AB (ref 11.0–14.6)
RETIC %: 5.95 % — AB (ref 0.80–1.80)
RETIC CT ABS: 191 10*3/uL — AB (ref 34.80–93.90)
WBC: 8.9 10*3/uL (ref 4.0–10.3)

## 2014-01-10 LAB — FERRITIN CHCC: Ferritin: 82 ng/ml (ref 22–316)

## 2014-01-10 MED ORDER — SODIUM CHLORIDE 0.9 % IV SOLN
INTRAVENOUS | Status: DC
  Administered 2014-01-10: 10:00:00 via INTRAVENOUS

## 2014-01-10 MED ORDER — SODIUM CHLORIDE 0.9 % IJ SOLN
10.0000 mL | INTRAMUSCULAR | Status: DC | PRN
  Filled 2014-01-10: qty 10

## 2014-01-10 MED ORDER — SODIUM CHLORIDE 0.9 % IV SOLN
1020.0000 mg | Freq: Once | INTRAVENOUS | Status: AC
  Administered 2014-01-10: 1020 mg via INTRAVENOUS
  Filled 2014-01-10: qty 34

## 2014-01-10 NOTE — Progress Notes (Signed)
Observed for 30 min post Feraheme infusion.  VSS.  No reaction noted

## 2014-01-10 NOTE — Patient Instructions (Signed)

## 2014-01-15 ENCOUNTER — Encounter: Payer: BC Managed Care – PPO | Admitting: Physician Assistant

## 2014-02-09 ENCOUNTER — Telehealth: Payer: Self-pay | Admitting: *Deleted

## 2014-02-09 ENCOUNTER — Telehealth: Payer: Self-pay | Admitting: Hematology and Oncology

## 2014-02-09 ENCOUNTER — Ambulatory Visit (HOSPITAL_BASED_OUTPATIENT_CLINIC_OR_DEPARTMENT_OTHER): Payer: BC Managed Care – PPO | Admitting: Hematology and Oncology

## 2014-02-09 ENCOUNTER — Other Ambulatory Visit (HOSPITAL_BASED_OUTPATIENT_CLINIC_OR_DEPARTMENT_OTHER): Payer: BC Managed Care – PPO

## 2014-02-09 ENCOUNTER — Other Ambulatory Visit: Payer: Self-pay | Admitting: Hematology and Oncology

## 2014-02-09 VITALS — BP 146/43 | HR 65 | Temp 98.3°F | Resp 21 | Ht 66.0 in | Wt 242.6 lb

## 2014-02-09 DIAGNOSIS — R0602 Shortness of breath: Secondary | ICD-10-CM

## 2014-02-09 DIAGNOSIS — K31811 Angiodysplasia of stomach and duodenum with bleeding: Secondary | ICD-10-CM

## 2014-02-09 DIAGNOSIS — D500 Iron deficiency anemia secondary to blood loss (chronic): Secondary | ICD-10-CM

## 2014-02-09 LAB — IRON AND TIBC CHCC
%SAT: 37 % (ref 20–55)
Iron: 98 ug/dL (ref 42–163)
TIBC: 268 ug/dL (ref 202–409)
UIBC: 170 ug/dL (ref 117–376)

## 2014-02-09 LAB — CBC & DIFF AND RETIC
BASO%: 0.3 % (ref 0.0–2.0)
Basophils Absolute: 0 10*3/uL (ref 0.0–0.1)
EOS ABS: 0.7 10*3/uL — AB (ref 0.0–0.5)
EOS%: 6.8 % (ref 0.0–7.0)
HEMATOCRIT: 32.6 % — AB (ref 38.4–49.9)
HEMOGLOBIN: 10.4 g/dL — AB (ref 13.0–17.1)
Immature Retic Fract: 11.6 % — ABNORMAL HIGH (ref 3.00–10.60)
LYMPH%: 16.1 % (ref 14.0–49.0)
MCH: 30.7 pg (ref 27.2–33.4)
MCHC: 31.9 g/dL — ABNORMAL LOW (ref 32.0–36.0)
MCV: 96.2 fL (ref 79.3–98.0)
MONO#: 1 10*3/uL — ABNORMAL HIGH (ref 0.1–0.9)
MONO%: 10.1 % (ref 0.0–14.0)
NEUT%: 66.7 % (ref 39.0–75.0)
NEUTROS ABS: 6.5 10*3/uL (ref 1.5–6.5)
PLATELETS: 199 10*3/uL (ref 140–400)
RBC: 3.39 10*6/uL — ABNORMAL LOW (ref 4.20–5.82)
RDW: 18.2 % — ABNORMAL HIGH (ref 11.0–14.6)
Retic %: 3.84 % — ABNORMAL HIGH (ref 0.80–1.80)
Retic Ct Abs: 130.18 10*3/uL — ABNORMAL HIGH (ref 34.80–93.90)
WBC: 9.7 10*3/uL (ref 4.0–10.3)
lymph#: 1.6 10*3/uL (ref 0.9–3.3)

## 2014-02-09 LAB — FERRITIN CHCC: FERRITIN: 115 ng/mL (ref 22–316)

## 2014-02-09 NOTE — Telephone Encounter (Signed)
gv adn pritned appt sched and avs for pt for NOV...sed added tx.

## 2014-02-09 NOTE — Assessment & Plan Note (Signed)
He has persistent anemia. He has improvement with good iron studies. He does not require further iron infusion or close blood monitoring. I will space out his appointment in 3 months and see him back with history, physical examination and blood work prior.

## 2014-02-09 NOTE — Assessment & Plan Note (Signed)
He has chronic shortness of breath due to morbid obesity, diastolic heart dysfunction and fluid retention. I recommend he follows with his primary care provider for medication adjustments.

## 2014-02-09 NOTE — Telephone Encounter (Signed)
Message copied by Cathlean Cower on Fri Feb 09, 2014 11:32 AM ------      Message from: [facility], [place]: Fri Feb 09, 2014  9:49 AM      Regarding: ferritin       Let them know it's better      ----- Message -----         From: [facility]         Sent: 02/09/2014   8:29 AM           To: Heath Lark, MD                   ------

## 2014-02-09 NOTE — Progress Notes (Signed)
[place] OFFICE PROGRESS NOTE  [name], MD SUMMARY OF HEMATOLOGIC HISTORY: DIAGNOSIS:  Chronic anemia, multifactorial due to to chronic GI bleed from arteriovenous malformation, on chronic antiplatelet agents for peripheral vascular disease and anemia chronic disease from chronic kidney failure  SUMMARY OF HEMATOLOGIC HISTORY: His last normal CBC was around 2008 with a hemoglobin of 14. Something around 2012, he had worsening anemia secondary to bleeding AVM. Plavix was discontinued. Between 2012 to 2014, Chronic GI bleed and most recently his antiplatelet agents was put on hold due to frequent bleeding. The patient requires some blood transfusions intermittently this year. The patient had received iron infusion on 11/10/2013, 12/15/2013 01/10/2014  INTERVAL HISTORY: [name] 80 y.o. male returns for further followup. He complained of persistent fatigue. The patient denies any recent signs or symptoms of bleeding such as spontaneous epistaxis, hematuria or hematochezia. He has shortness of breath on minimal exertion.  I have reviewed the past medical history, past surgical history, social history and family history with the patient and they are unchanged from previous note.  ALLERGIES:  is allergic to donepezil; heparin; hydralazine; hydromorphone; morphine; plavix; promethazine; sulfonamide derivatives; cefuroxime; metoprolol; and ertapenem.  MEDICATIONS:  Current Outpatient Prescriptions  Medication Sig Dispense Refill  . acetaminophen (TYLENOL) 650 MG CR tablet Take 1,300 mg by mouth 2 (two) times daily.      Marland Kitchen albuterol (PROAIR HFA) 108 (90 BASE) MCG/ACT inhaler Inhale 2 puffs into the lungs daily as needed for wheezing or shortness of breath.      Marland Kitchen albuterol (PROVENTIL) (2.5 MG/3ML) 0.083% nebulizer solution Take 2.5 mg by nebulization 3 (three) times daily.       Marland Kitchen allopurinol (ZYLOPRIM) 300 MG tablet Take 300 mg by mouth 2 (two) times daily.       Marland Kitchen amLODipine (NORVASC) 10 MG tablet Take 10 mg by mouth daily.      . Artificial Tear Ointment (DRY EYES OP) Place 2 drops into both eyes daily as needed (for dry eyes).      Marland Kitchen aspirin EC 325 MG tablet Take 1 tablet (325 mg total) by mouth daily.  30 tablet  0  . atorvastatin (LIPITOR) 20 MG tablet Take 20 mg by mouth daily.       . B-D UF III MINI PEN NEEDLES 31G X 5 MM MISC 1 each by Other route See admin instructions. Use with insulin pens daily.      . cloNIDine HCl (KAPVAY) 0.1 MG TB12 ER tablet Take 1 tablet (0.1 mg total) by mouth 2 (two) times daily.  60 tablet  5  . colchicine 0.6 MG tablet Take 0.6 mg by mouth 2 (two) times daily.      Marland Kitchen escitalopram (LEXAPRO) 20 MG tablet Take 20 mg by mouth daily.      Marland Kitchen ezetimibe (ZETIA) 10 MG tablet Take 10 mg by mouth daily.       . folic acid (FOLVITE) 989 MCG tablet Take 1,200 mcg by mouth daily.       . furosemide (LASIX) 80 MG tablet Take 1 tablet (80 mg total) by mouth 2 (two) times daily.  180 tablet  3  . gabapentin (NEURONTIN) 100 MG tablet Take 100 mg by mouth at bedtime.       Marland Kitchen guaifenesin (ROBITUSSIN) 100 MG/5ML syrup Take 200 mg by mouth 3 (three) times daily as needed for cough.      . insulin aspart (NOVOLOG) 100 UNIT/ML injection Inject 20 Units into the skin  3 (three) times daily as needed for high blood sugar.       . insulin glargine (LANTUS) 100 UNIT/ML injection Inject 62 Units into the skin 2 (two) times daily.      . isosorbide mononitrate (IMDUR) 60 MG 24 hr tablet Take 60 mg by mouth daily.      . metolazone (ZAROXOLYN) 2.5 MG tablet Take 2.5 mg by mouth See admin instructions. Take 30 minutes prior to morning furosemide dose for a weight gain of 3 lbs or more as needed      . Multiple Vitamin (MULTIVITAMIN WITH MINERALS) TABS Take 0.5 tablets by mouth 2 (two) times daily.      . nitroGLYCERIN (NITROSTAT) 0.4 MG SL tablet Place 0.4 mg under the tongue every 5 (five) minutes as needed for chest pain. May repeat up to 3 times       . ONE TOUCH ULTRA TEST test strip 1 each by Other route See admin instructions. Check blood sugar 4 times daily.      . pantoprazole (PROTONIX) 40 MG tablet Take 40 mg by mouth daily.       . predniSONE (DELTASONE) 5 MG tablet Take 5 mg by mouth 2 (two) times daily.       Marland Kitchen tiotropium (SPIRIVA) 18 MCG inhalation capsule Place 18 mcg into inhaler and inhale at bedtime.       No current facility-administered medications for this visit.     REVIEW OF SYSTEMS:   Constitutional: Denies fevers, chills or night sweats Eyes: Denies blurriness of vision Ears, nose, mouth, throat, and face: Denies mucositis or sore throat Cardiovascular: Denies palpitation, chest discomfort or lower extremity swelling Gastrointestinal:  Denies nausea, heartburn or change in bowel habits Skin: Denies abnormal skin rashes Lymphatics: Denies new lymphadenopathy or easy bruising Neurological:Denies numbness, tingling or new weaknesses Behavioral/Psych: Mood is stable, no new changes  All other systems were reviewed with the patient and are negative.  PHYSICAL EXAMINATION: ECOG PERFORMANCE STATUS: 2 - Symptomatic, <50% confined to bed  Filed Vitals:   02/09/14 0842  BP: 146/43  Pulse: 65  Temp: 98.3 F (36.8 C)  Resp: 21   Filed Weights   02/09/14 0842  Weight: 242 lb 9.6 oz (110.043 kg)    GENERAL:alert, no distress and comfortable. He is morbidly obese  SKIN: skin color, texture, turgor are normal, no rashes or significant lesions EYES: normal, Conjunctiva are pink and non-injected, sclera clear OROPHARYNX:no exudate, no erythema and lips, buccal mucosa, and tongue normal  NECK: supple, thyroid normal size, non-tender, without nodularity LYMPH:  no palpable lymphadenopathy in the cervical, axillary or inguinal LUNGS: clear to auscultation and percussion with normal breathing effort HEART: regular rate & rhythm and no murmurs  with moderate bilateral  lower extremity edema ABDOMEN:abdomen soft,  non-tender and normal bowel sounds Musculoskeletal:no cyanosis of digits and no clubbing  NEURO: alert & oriented x 3 with fluent speech, no focal motor/sensory deficits  LABORATORY DATA:  I have reviewed the data as listed Results for orders placed in visit on 02/09/14 (from the past 48 hour(s))  CBC & DIFF AND RETIC     Status: Abnormal   Collection Time    02/09/14  8:15 AM      Result Value Ref Range   WBC 9.7  4.0 - 10.3 10e3/uL   NEUT# 6.5  1.5 - 6.5 10e3/uL   HGB 10.4 (*) 13.0 - 17.1 g/dL   HCT 32.6 (*) 38.4 - 49.9 %   Platelets 199  140 - 400 10e3/uL   MCV 96.2  79.3 - 98.0 fL   MCH 30.7  27.2 - 33.4 pg   MCHC 31.9 (*) 32.0 - 36.0 g/dL   RBC 3.39 (*) 4.20 - 5.82 10e6/uL   RDW 18.2 (*) 11.0 - 14.6 %   lymph# 1.6  0.9 - 3.3 10e3/uL   MONO# 1.0 (*) 0.1 - 0.9 10e3/uL   Eosinophils Absolute 0.7 (*) 0.0 - 0.5 10e3/uL   Basophils Absolute 0.0  0.0 - 0.1 10e3/uL   NEUT% 66.7  39.0 - 75.0 %   LYMPH% 16.1  14.0 - 49.0 %   MONO% 10.1  0.0 - 14.0 %   EOS% 6.8  0.0 - 7.0 %   BASO% 0.3  0.0 - 2.0 %   Retic % 3.84 (*) 0.80 - 1.80 %   Retic Ct Abs 130.18 (*) 34.80 - 93.90 10e3/uL   Immature Retic Fract 11.60 (*) 3.00 - 10.60 %  IRON AND TIBC [facility]     Status: None   Collection Time    02/09/14  8:15 AM      Result Value Ref Range   Iron 98  42 - 163 ug/dL   TIBC 268  202 - 409 ug/dL   UIBC 170  117 - 376 ug/dL   %SAT 37  20 - 55 %  FERRITIN [facility]     Status: None   Collection Time    02/09/14  8:15 AM      Result Value Ref Range   Ferritin 115  22 - 316 ng/ml    Lab Results  Component Value Date   WBC 9.7 02/09/2014   HGB 10.4* 02/09/2014   HCT 32.6* 02/09/2014   MCV 96.2 02/09/2014   PLT 199 02/09/2014    ASSESSMENT & PLAN:  Gastric and duodenal angiodysplasia with hemorrhage He has persistent anemia. He has improvement with good iron studies. He does not require further iron infusion or close blood monitoring. I will space out his appointment in 3 months and see him  back with history, physical examination and blood work prior.  Shortness of breath He has chronic shortness of breath due to morbid obesity, diastolic heart dysfunction and fluid retention. I recommend he follows with his primary care provider for medication adjustments.    All questions were answered. The patient knows to call the clinic with any problems, questions or concerns. No barriers to learning was detected.  I spent 15 minutes counseling the patient face to face. The total time spent in the appointment was 20 minutes and more than 50% was on counseling.     [facility], [place], MD 02/09/2014 4:13 PM

## 2014-02-09 NOTE — Telephone Encounter (Signed)
Informed pt of Dr. Gorsuch's note below.  He verbalized understanding.  

## 2014-02-12 ENCOUNTER — Telehealth: Payer: Self-pay | Admitting: *Deleted

## 2014-02-12 ENCOUNTER — Emergency Department (HOSPITAL_COMMUNITY): Payer: BC Managed Care – PPO

## 2014-02-12 ENCOUNTER — Other Ambulatory Visit: Payer: Self-pay | Admitting: *Deleted

## 2014-02-12 ENCOUNTER — Ambulatory Visit (HOSPITAL_BASED_OUTPATIENT_CLINIC_OR_DEPARTMENT_OTHER): Payer: BC Managed Care – PPO

## 2014-02-12 ENCOUNTER — Encounter (HOSPITAL_COMMUNITY): Payer: Self-pay | Admitting: Emergency Medicine

## 2014-02-12 ENCOUNTER — Telehealth: Payer: Self-pay | Admitting: Hematology and Oncology

## 2014-02-12 ENCOUNTER — Inpatient Hospital Stay (HOSPITAL_COMMUNITY)
Admission: EM | Admit: 2014-02-12 | Discharge: 2014-02-13 | DRG: 313 | Disposition: A | Discharge status: Home / Self Care | Payer: BC Managed Care – PPO | Attending: Internal Medicine | Admitting: Internal Medicine

## 2014-02-12 DIAGNOSIS — M109 Gout, unspecified: Secondary | ICD-10-CM | POA: Diagnosis present

## 2014-02-12 DIAGNOSIS — R079 Chest pain, unspecified: Secondary | ICD-10-CM | POA: Diagnosis present

## 2014-02-12 DIAGNOSIS — E1139 Type 2 diabetes mellitus with other diabetic ophthalmic complication: Secondary | ICD-10-CM | POA: Diagnosis present

## 2014-02-12 DIAGNOSIS — J449 Chronic obstructive pulmonary disease, unspecified: Secondary | ICD-10-CM | POA: Diagnosis present

## 2014-02-12 DIAGNOSIS — Z79899 Other long term (current) drug therapy: Secondary | ICD-10-CM

## 2014-02-12 DIAGNOSIS — Z85828 Personal history of other malignant neoplasm of skin: Secondary | ICD-10-CM

## 2014-02-12 DIAGNOSIS — J9610 Chronic respiratory failure, unspecified whether with hypoxia or hypercapnia: Secondary | ICD-10-CM | POA: Diagnosis present

## 2014-02-12 DIAGNOSIS — I2510 Atherosclerotic heart disease of native coronary artery without angina pectoris: Secondary | ICD-10-CM | POA: Diagnosis present

## 2014-02-12 DIAGNOSIS — F329 Major depressive disorder, single episode, unspecified: Secondary | ICD-10-CM | POA: Diagnosis present

## 2014-02-12 DIAGNOSIS — Z794 Long term (current) use of insulin: Secondary | ICD-10-CM

## 2014-02-12 DIAGNOSIS — R109 Unspecified abdominal pain: Secondary | ICD-10-CM | POA: Diagnosis present

## 2014-02-12 DIAGNOSIS — Z7982 Long term (current) use of aspirin: Secondary | ICD-10-CM | POA: Diagnosis not present

## 2014-02-12 DIAGNOSIS — R1032 Left lower quadrant pain: Secondary | ICD-10-CM

## 2014-02-12 DIAGNOSIS — N058 Unspecified nephritic syndrome with other morphologic changes: Secondary | ICD-10-CM | POA: Diagnosis present

## 2014-02-12 DIAGNOSIS — G8929 Other chronic pain: Secondary | ICD-10-CM | POA: Diagnosis present

## 2014-02-12 DIAGNOSIS — K5520 Angiodysplasia of colon without hemorrhage: Secondary | ICD-10-CM | POA: Diagnosis present

## 2014-02-12 DIAGNOSIS — I252 Old myocardial infarction: Secondary | ICD-10-CM | POA: Diagnosis not present

## 2014-02-12 DIAGNOSIS — R0789 Other chest pain: Secondary | ICD-10-CM | POA: Diagnosis not present

## 2014-02-12 DIAGNOSIS — K219 Gastro-esophageal reflux disease without esophagitis: Secondary | ICD-10-CM | POA: Diagnosis present

## 2014-02-12 DIAGNOSIS — Z9861 Coronary angioplasty status: Secondary | ICD-10-CM

## 2014-02-12 DIAGNOSIS — I739 Peripheral vascular disease, unspecified: Secondary | ICD-10-CM | POA: Diagnosis present

## 2014-02-12 DIAGNOSIS — K31811 Angiodysplasia of stomach and duodenum with bleeding: Secondary | ICD-10-CM

## 2014-02-12 DIAGNOSIS — E669 Obesity, unspecified: Secondary | ICD-10-CM | POA: Diagnosis present

## 2014-02-12 DIAGNOSIS — D649 Anemia, unspecified: Secondary | ICD-10-CM | POA: Diagnosis present

## 2014-02-12 DIAGNOSIS — I509 Heart failure, unspecified: Secondary | ICD-10-CM | POA: Diagnosis present

## 2014-02-12 DIAGNOSIS — Z87891 Personal history of nicotine dependence: Secondary | ICD-10-CM

## 2014-02-12 DIAGNOSIS — Z9981 Dependence on supplemental oxygen: Secondary | ICD-10-CM | POA: Diagnosis not present

## 2014-02-12 DIAGNOSIS — E1129 Type 2 diabetes mellitus with other diabetic kidney complication: Secondary | ICD-10-CM | POA: Diagnosis present

## 2014-02-12 DIAGNOSIS — F3289 Other specified depressive episodes: Secondary | ICD-10-CM | POA: Diagnosis present

## 2014-02-12 DIAGNOSIS — Z961 Presence of intraocular lens: Secondary | ICD-10-CM

## 2014-02-12 DIAGNOSIS — N183 Chronic kidney disease, stage 3 unspecified: Secondary | ICD-10-CM | POA: Diagnosis present

## 2014-02-12 DIAGNOSIS — Z9849 Cataract extraction status, unspecified eye: Secondary | ICD-10-CM | POA: Diagnosis not present

## 2014-02-12 DIAGNOSIS — G4733 Obstructive sleep apnea (adult) (pediatric): Secondary | ICD-10-CM | POA: Diagnosis present

## 2014-02-12 DIAGNOSIS — Z6837 Body mass index (BMI) 37.0-37.9, adult: Secondary | ICD-10-CM

## 2014-02-12 DIAGNOSIS — G309 Alzheimer's disease, unspecified: Secondary | ICD-10-CM | POA: Diagnosis present

## 2014-02-12 DIAGNOSIS — K5521 Angiodysplasia of colon with hemorrhage: Secondary | ICD-10-CM

## 2014-02-12 DIAGNOSIS — Z951 Presence of aortocoronary bypass graft: Secondary | ICD-10-CM | POA: Diagnosis not present

## 2014-02-12 DIAGNOSIS — K921 Melena: Secondary | ICD-10-CM | POA: Diagnosis present

## 2014-02-12 DIAGNOSIS — F0280 Dementia in other diseases classified elsewhere without behavioral disturbance: Secondary | ICD-10-CM | POA: Diagnosis present

## 2014-02-12 DIAGNOSIS — K922 Gastrointestinal hemorrhage, unspecified: Secondary | ICD-10-CM

## 2014-02-12 DIAGNOSIS — I25810 Atherosclerosis of coronary artery bypass graft(s) without angina pectoris: Secondary | ICD-10-CM

## 2014-02-12 DIAGNOSIS — M549 Dorsalgia, unspecified: Secondary | ICD-10-CM | POA: Diagnosis present

## 2014-02-12 DIAGNOSIS — J4489 Other specified chronic obstructive pulmonary disease: Secondary | ICD-10-CM | POA: Diagnosis present

## 2014-02-12 DIAGNOSIS — E11319 Type 2 diabetes mellitus with unspecified diabetic retinopathy without macular edema: Secondary | ICD-10-CM | POA: Diagnosis present

## 2014-02-12 DIAGNOSIS — M129 Arthropathy, unspecified: Secondary | ICD-10-CM | POA: Diagnosis present

## 2014-02-12 DIAGNOSIS — D500 Iron deficiency anemia secondary to blood loss (chronic): Secondary | ICD-10-CM

## 2014-02-12 DIAGNOSIS — I129 Hypertensive chronic kidney disease with stage 1 through stage 4 chronic kidney disease, or unspecified chronic kidney disease: Secondary | ICD-10-CM | POA: Diagnosis present

## 2014-02-12 DIAGNOSIS — I4891 Unspecified atrial fibrillation: Secondary | ICD-10-CM | POA: Diagnosis present

## 2014-02-12 DIAGNOSIS — R072 Precordial pain: Secondary | ICD-10-CM

## 2014-02-12 DIAGNOSIS — J42 Unspecified chronic bronchitis: Secondary | ICD-10-CM | POA: Diagnosis present

## 2014-02-12 DIAGNOSIS — E785 Hyperlipidemia, unspecified: Secondary | ICD-10-CM

## 2014-02-12 DIAGNOSIS — I5032 Chronic diastolic (congestive) heart failure: Secondary | ICD-10-CM | POA: Diagnosis present

## 2014-02-12 DIAGNOSIS — I10 Essential (primary) hypertension: Secondary | ICD-10-CM

## 2014-02-12 DIAGNOSIS — I480 Paroxysmal atrial fibrillation: Secondary | ICD-10-CM

## 2014-02-12 HISTORY — DX: Angiodysplasia of colon without hemorrhage: K55.20

## 2014-02-12 LAB — CBC & DIFF AND RETIC
BASO%: 0.2 % (ref 0.0–2.0)
Basophils Absolute: 0 10*3/uL (ref 0.0–0.1)
EOS%: 7.2 % — ABNORMAL HIGH (ref 0.0–7.0)
Eosinophils Absolute: 0.8 10*3/uL — ABNORMAL HIGH (ref 0.0–0.5)
HCT: 33.6 % — ABNORMAL LOW (ref 38.4–49.9)
HGB: 11.1 g/dL — ABNORMAL LOW (ref 13.0–17.1)
Immature Retic Fract: 15.9 % — ABNORMAL HIGH (ref 3.00–10.60)
LYMPH%: 18.8 % (ref 14.0–49.0)
MCH: 31.2 pg (ref 27.2–33.4)
MCHC: 33 g/dL (ref 32.0–36.0)
MCV: 94.4 fL (ref 79.3–98.0)
MONO#: 1 10*3/uL — AB (ref 0.1–0.9)
MONO%: 9.4 % (ref 0.0–14.0)
NEUT#: 7.1 10*3/uL — ABNORMAL HIGH (ref 1.5–6.5)
NEUT%: 64.4 % (ref 39.0–75.0)
Platelets: 213 10*3/uL (ref 140–400)
RBC: 3.56 10*6/uL — ABNORMAL LOW (ref 4.20–5.82)
RDW: 18.4 % — ABNORMAL HIGH (ref 11.0–14.6)
RETIC %: 4.64 % — AB (ref 0.80–1.80)
Retic Ct Abs: 165.18 10*3/uL — ABNORMAL HIGH (ref 34.80–93.90)
WBC: 11 10*3/uL — AB (ref 4.0–10.3)
lymph#: 2.1 10*3/uL (ref 0.9–3.3)
nRBC: 0 % (ref 0–0)

## 2014-02-12 LAB — PROTIME-INR
INR: 1.12 (ref 0.00–1.49)
PROTHROMBIN TIME: 14.4 s (ref 11.6–15.2)

## 2014-02-12 LAB — I-STAT TROPONIN, ED
Troponin i, poc: 0.03 ng/mL (ref 0.00–0.08)
Troponin i, poc: 0.03 ng/mL (ref 0.00–0.08)

## 2014-02-12 LAB — BASIC METABOLIC PANEL
Anion gap: 18 — ABNORMAL HIGH (ref 5–15)
BUN: 57 mg/dL — ABNORMAL HIGH (ref 6–23)
CO2: 27 mEq/L (ref 19–32)
CREATININE: 2.37 mg/dL — AB (ref 0.50–1.35)
Calcium: 9.9 mg/dL (ref 8.4–10.5)
Chloride: 90 mEq/L — ABNORMAL LOW (ref 96–112)
GFR, EST AFRICAN AMERICAN: 29 mL/min — AB (ref >90)
GFR, EST NON AFRICAN AMERICAN: 25 mL/min — AB (ref >90)
Glucose, Bld: 279 mg/dL — ABNORMAL HIGH (ref 70–99)
POTASSIUM: 4.1 meq/L (ref 3.7–5.3)
Sodium: 135 mEq/L — ABNORMAL LOW (ref 137–147)

## 2014-02-12 LAB — CBC
HEMATOCRIT: 33.2 % — AB (ref 39.0–52.0)
HEMATOCRIT: 30.2 % — AB (ref 39.0–52.0)
Hemoglobin: 10.9 g/dL — ABNORMAL LOW (ref 13.0–17.0)
Hemoglobin: 10 g/dL — ABNORMAL LOW (ref 13.0–17.0)
MCH: 31 pg (ref 26.0–34.0)
MCH: 31.1 pg (ref 26.0–34.0)
MCHC: 32.8 g/dL (ref 30.0–36.0)
MCHC: 33.1 g/dL (ref 30.0–36.0)
MCV: 94.3 fL (ref 78.0–100.0)
MCV: 93.8 fL (ref 78.0–100.0)
Platelets: 232 10*3/uL (ref 150–400)
Platelets: 201 10*3/uL (ref 150–400)
RBC: 3.52 MIL/uL — ABNORMAL LOW (ref 4.22–5.81)
RBC: 3.22 MIL/uL — ABNORMAL LOW (ref 4.22–5.81)
RDW: 18.2 % — AB (ref 11.5–15.5)
RDW: 18.3 % — ABNORMAL HIGH (ref 11.5–15.5)
WBC: 10.3 10*3/uL (ref 4.0–10.5)
WBC: 8.2 10*3/uL (ref 4.0–10.5)

## 2014-02-12 LAB — LIPASE, BLOOD: Lipase: 42 U/L (ref 11–59)

## 2014-02-12 LAB — IRON AND TIBC CHCC
%SAT: 20 % (ref 20–55)
Iron: 60 ug/dL (ref 42–163)
TIBC: 299 ug/dL (ref 202–409)
UIBC: 239 ug/dL (ref 117–376)

## 2014-02-12 LAB — POC OCCULT BLOOD, ED: FECAL OCCULT BLD: POSITIVE — AB

## 2014-02-12 LAB — FERRITIN CHCC: FERRITIN: 105 ng/mL (ref 22–316)

## 2014-02-12 LAB — PRO B NATRIURETIC PEPTIDE: Pro B Natriuretic peptide (BNP): 631.8 pg/mL — ABNORMAL HIGH (ref 0–450)

## 2014-02-12 LAB — I-STAT CG4 LACTIC ACID, ED: LACTIC ACID, VENOUS: 1.97 mmol/L (ref 0.5–2.2)

## 2014-02-12 LAB — TROPONIN I: Troponin I: <0.3 ng/mL (ref <0.30)

## 2014-02-12 MED ORDER — FOLIC ACID 1 MG PO TABS
1000.0000 ug | ORAL_TABLET | Freq: Every day | ORAL | Status: DC
  Administered 2014-02-13: 1 mg via ORAL
  Filled 2014-02-12: qty 1

## 2014-02-12 MED ORDER — ALBUTEROL SULFATE (2.5 MG/3ML) 0.083% IN NEBU
2.5000 mg | INHALATION_SOLUTION | Freq: Two times a day (BID) | RESPIRATORY_TRACT | Status: DC
  Administered 2014-02-12 – 2014-02-13 (×2): 2.5 mg via RESPIRATORY_TRACT
  Filled 2014-02-12 (×2): qty 3

## 2014-02-12 MED ORDER — IOHEXOL 300 MG/ML  SOLN
25.0000 mL | INTRAMUSCULAR | Status: AC
  Administered 2014-02-12: 25 mL via ORAL

## 2014-02-12 MED ORDER — PANTOPRAZOLE SODIUM 40 MG PO TBEC
40.0000 mg | DELAYED_RELEASE_TABLET | Freq: Every day | ORAL | Status: DC
  Administered 2014-02-13: 40 mg via ORAL
  Filled 2014-02-12: qty 1

## 2014-02-12 MED ORDER — ATORVASTATIN CALCIUM 20 MG PO TABS
20.0000 mg | ORAL_TABLET | Freq: Every day | ORAL | Status: DC
  Administered 2014-02-12: 20 mg via ORAL
  Filled 2014-02-12 (×2): qty 1

## 2014-02-12 MED ORDER — GABAPENTIN 100 MG PO TABS: 100.0000 mg | ORAL_TABLET | Freq: Every day | ORAL | Status: DC

## 2014-02-12 MED ORDER — AMLODIPINE BESYLATE 10 MG PO TABS
10.0000 mg | ORAL_TABLET | Freq: Every day | ORAL | Status: DC
  Administered 2014-02-13: 10 mg via ORAL
  Filled 2014-02-12: qty 1

## 2014-02-12 MED ORDER — TIOTROPIUM BROMIDE MONOHYDRATE 18 MCG IN CAPS
18.0000 ug | ORAL_CAPSULE | Freq: Every day | RESPIRATORY_TRACT | Status: DC
  Filled 2014-02-12: qty 5

## 2014-02-12 MED ORDER — ASPIRIN EC 325 MG PO TBEC
325.0000 mg | DELAYED_RELEASE_TABLET | Freq: Every day | ORAL | Status: DC
  Administered 2014-02-13: 325 mg via ORAL
  Filled 2014-02-12: qty 1

## 2014-02-12 MED ORDER — ALBUTEROL SULFATE (2.5 MG/3ML) 0.083% IN NEBU
2.5000 mg | INHALATION_SOLUTION | Freq: Two times a day (BID) | RESPIRATORY_TRACT | Status: DC | PRN
  Filled 2014-02-12: qty 3

## 2014-02-12 MED ORDER — SODIUM CHLORIDE 0.9 % IJ SOLN: 3.0000 mL | INTRAMUSCULAR | Status: DC | PRN

## 2014-02-12 MED ORDER — SODIUM CHLORIDE 0.9 % IV SOLN: 250.0000 mL | INTRAVENOUS | Status: DC | PRN

## 2014-02-12 MED ORDER — INSULIN GLARGINE 100 UNIT/ML ~~LOC~~ SOLN
60.0000 [IU] | Freq: Two times a day (BID) | SUBCUTANEOUS | Status: DC
  Administered 2014-02-12 – 2014-02-13 (×2): 60 [IU] via SUBCUTANEOUS
  Filled 2014-02-12 (×3): qty 0.6

## 2014-02-12 MED ORDER — SODIUM CHLORIDE 0.9 % IJ SOLN
3.0000 mL | Freq: Two times a day (BID) | INTRAMUSCULAR | Status: DC
  Administered 2014-02-12: 3 mL via INTRAVENOUS

## 2014-02-12 MED ORDER — NITROGLYCERIN 0.4 MG SL SUBL: 0.4000 mg | SUBLINGUAL_TABLET | SUBLINGUAL | Status: DC | PRN

## 2014-02-12 MED ORDER — NYSTATIN 100000 UNIT/ML MT SUSP
5.0000 mL | Freq: Every day | OROMUCOSAL | Status: DC | PRN
  Filled 2014-02-12: qty 5

## 2014-02-12 MED ORDER — EZETIMIBE 10 MG PO TABS
10.0000 mg | ORAL_TABLET | Freq: Every day | ORAL | Status: DC
  Administered 2014-02-13: 10 mg via ORAL
  Filled 2014-02-12: qty 1

## 2014-02-12 MED ORDER — FUROSEMIDE 80 MG PO TABS
80.0000 mg | ORAL_TABLET | Freq: Two times a day (BID) | ORAL | Status: DC
  Administered 2014-02-13: 80 mg via ORAL
  Filled 2014-02-12 (×4): qty 1

## 2014-02-12 MED ORDER — ACETAMINOPHEN 325 MG PO TABS: 650.0000 mg | ORAL_TABLET | Freq: Four times a day (QID) | ORAL | Status: DC | PRN

## 2014-02-12 MED ORDER — GUAIFENESIN 100 MG/5ML PO SYRP
200.0000 mg | ORAL_SOLUTION | Freq: Three times a day (TID) | ORAL | Status: DC | PRN
  Filled 2014-02-12: qty 10

## 2014-02-12 MED ORDER — ALLOPURINOL 300 MG PO TABS
300.0000 mg | ORAL_TABLET | Freq: Two times a day (BID) | ORAL | Status: DC
  Administered 2014-02-12 – 2014-02-13 (×2): 300 mg via ORAL
  Filled 2014-02-12 (×3): qty 1

## 2014-02-12 MED ORDER — ALBUTEROL SULFATE HFA 108 (90 BASE) MCG/ACT IN AERS: 2.0000 | INHALATION_SPRAY | Freq: Two times a day (BID) | RESPIRATORY_TRACT | Status: DC | PRN

## 2014-02-12 MED ORDER — ISOSORBIDE MONONITRATE ER 60 MG PO TB24
60.0000 mg | ORAL_TABLET | Freq: Every day | ORAL | Status: DC
  Administered 2014-02-13: 60 mg via ORAL
  Filled 2014-02-12: qty 1

## 2014-02-12 MED ORDER — COLCHICINE 0.6 MG PO TABS
0.6000 mg | ORAL_TABLET | Freq: Two times a day (BID) | ORAL | Status: DC
  Administered 2014-02-13 (×2): 0.6 mg via ORAL
  Filled 2014-02-12 (×5): qty 1

## 2014-02-12 MED ORDER — ACETAMINOPHEN ER 650 MG PO TBCR: 650.0000 mg | EXTENDED_RELEASE_TABLET | Freq: Two times a day (BID) | ORAL | Status: DC

## 2014-02-12 MED ORDER — CLONIDINE HCL ER 0.1 MG PO TB12
0.1000 mg | ORAL_TABLET | Freq: Two times a day (BID) | ORAL | Status: DC
  Administered 2014-02-12 – 2014-02-13 (×2): 0.1 mg via ORAL
  Filled 2014-02-12 (×3): qty 1

## 2014-02-12 MED ORDER — SODIUM CHLORIDE 0.9 % IJ SOLN
3.0000 mL | Freq: Two times a day (BID) | INTRAMUSCULAR | Status: DC
  Administered 2014-02-13: 3 mL via INTRAVENOUS

## 2014-02-12 MED ORDER — ADULT MULTIVITAMIN W/MINERALS CH
1.0000 | ORAL_TABLET | Freq: Every day | ORAL | Status: DC
  Administered 2014-02-13: 1 via ORAL
  Filled 2014-02-12: qty 1

## 2014-02-12 MED ORDER — INSULIN ASPART 100 UNIT/ML ~~LOC~~ SOLN: 0.0000 [IU] | Freq: Three times a day (TID) | SUBCUTANEOUS | Status: DC

## 2014-02-12 MED ORDER — PREDNISONE 5 MG PO TABS
5.0000 mg | ORAL_TABLET | Freq: Every day | ORAL | Status: DC
  Administered 2014-02-13: 5 mg via ORAL
  Filled 2014-02-12 (×2): qty 1

## 2014-02-12 MED ORDER — ESCITALOPRAM OXALATE 20 MG PO TABS
20.0000 mg | ORAL_TABLET | Freq: Every day | ORAL | Status: DC
  Administered 2014-02-13: 20 mg via ORAL
  Filled 2014-02-12: qty 1

## 2014-02-12 MED ORDER — GABAPENTIN 100 MG PO CAPS
100.0000 mg | ORAL_CAPSULE | Freq: Every day | ORAL | Status: DC
  Administered 2014-02-12: 100 mg via ORAL
  Filled 2014-02-12 (×2): qty 1

## 2014-02-12 NOTE — ED Provider Notes (Signed)
  Pt seen and evaluated.  C/O weakness, worsening dyspnea, black stools, SOB.  H/O Interstitial lung Dz, CAD, GI AVMs c Recurrent GIB. On ASA now.  Sx since Friday/3 days.  Pt asked to sit on side of bed.  Dyspneic, desats to 80%, symptomatic.  CXR c chronic changes, no ACS.  Guaiac + stool, c Hb at 10.9.    I agree with Admit for Serial HB.  Tanna Furry, MD 02/12/14 2059

## 2014-02-12 NOTE — Telephone Encounter (Signed)
Pt's wife left a message stating patient's stool has been very dark over the weekend and he has been very weak. Is wondering if he needs to come in today for lab check

## 2014-02-12 NOTE — ED Notes (Signed)
Pt reports 7/10 left sided chest "pressure" radiating across to right chest x 4 days with weakness, increased SOB and nausea. Pt states "I just feel so weak." Hx: 4 MI's.

## 2014-02-12 NOTE — ED Notes (Signed)
EKG completed in triage.

## 2014-02-12 NOTE — ED Notes (Signed)
Pt not in room.

## 2014-02-12 NOTE — ED Provider Notes (Signed)
CSN: [id number]     Arrival date & time 02/12/14  1504 History   First MD Initiated Contact with Patient 02/12/14 1606     Chief Complaint  Patient presents with  . Chest Pain  . Shortness of Breath   Patient is a 80 y.o. male presenting with chest pain and shortness of breath. The history is provided by the patient.  Chest Pain Pain location:  L lateral chest Pain quality: aching   Radiates to: LUQ. Pain radiates to the back: no   Pain severity now: 7/10. Onset quality:  Gradual Duration:  3 days Timing:  Intermittent Progression:  Resolved Context: movement (worse with waling, better at rest)   Context: not breathing   Associated symptoms: abdominal pain (LLQ), cough (chronic white production non bloody), PND (baseline on CPAP at night), shortness of breath and vomiting   Associated symptoms: no back pain, no diaphoresis, no fever, no lower extremity edema, no nausea, no near-syncope, no numbness and no orthopnea   Abdominal pain:    Location:  LLQ   Quality:  Aching Shortness of Breath Severity:  Mild Context: not URI   Relieved by:  Oxygen Associated symptoms: abdominal pain (LLQ), chest pain, cough (chronic white production non bloody), PND (baseline on CPAP at night), sputum production and vomiting   Associated symptoms: no diaphoresis, no fever, no hemoptysis, no sore throat and no wheezing   Risk factors: obesity and tobacco use (quit)   Risk factors: no hx of PE/DVT    Wears O2 3L for past few days due to dyspnea, none at baseline and someitmes supplements CPAP at night. Onset of symptoms was after flu shot.  No sick contacts. No hx of PE/DVT.  CAD s/p CABG  Past Medical History  Diagnosis Date  . Adenomatous colon polyp   . Diverticulosis   . GERD (gastroesophageal reflux disease)   . Hiatal hernia   . Alzheimer disease   . Gout     "hands; backbone; elbows; shoulders"  . Asthma with bronchitis   . COPD (chronic obstructive pulmonary disease)   . Chronic  hypoxemic respiratory failure   . CHF (congestive heart failure)     grade 2 diastolic dysfunction, EF 16-60% on ECHO 12/2011  . Blood transfusion     "I've had about 6 pints; related to a GI bleed; colon bleeding"   . Anemia   . GI bleed     GI [name],   . Hypertension   . Peripheral vascular disease     PAD  . Dysrhythmia     "skips"  . Anginal pain   . Myocardial infarction     "they said I had 2 light ones"  . Shortness of breath 12/31/2011    "all the time"  . Type II diabetes mellitus     with complications:  retinopathy, nephrophathy.   . Arthritis     "plenty"  . Chronic lower back pain   . Renal insufficiency     "went on dialysis probably 3 times" Last 04/2011  . Skin cancer     "forehead; arms"  . Depression   . Diverticulosis   . Colon polyps     adenomatous and hyperplastic  . GERD (gastroesophageal reflux disease)   . Hiatal hernia   . Memory deficits 01/11/2013  . Obesity   . Carotid bruit     bilateral  . CKD (chronic kidney disease) 03/07/2013  . Hemorrhoids   . AVM (arteriovenous malformation) of colon   .  Gastric polyp   . Atrial fibrillation   . High cholesterol   . Pneumonia     "have had it several times"  . Chronic bronchitis   . OSA on CPAP   . On home oxygen therapy     "3L just at night" (10/16/2013)   Past Surgical History  Procedure Laterality Date  . Hand surgery Left 1990s    "chain saw accident; had to reattach fingers on left hand"  . Esophagogastroduodenoscopy  05/05/2011    Procedure: ESOPHAGOGASTRODUODENOSCOPY (EGD);  Surgeon: Inda Castle, MD;  Location: [facility];  Service: Endoscopy;  Laterality: N/A;  . Flexible sigmoidoscopy  05/05/2011    Procedure: FLEXIBLE SIGMOIDOSCOPY;  Surgeon: Inda Castle, MD;  Location: [facility];  Service: Endoscopy;  Laterality: N/A;  . Laparoscopic cholecystectomy  ~ 2001  . Excisional hemorrhoidectomy  1950's  . Cataract extraction w/ intraocular lens  implant, bilateral  Bilateral   . Coronary artery bypass graft  1997    CABG X2 vessels  . Iliac artery stent  11/2011    left/H&P  . Renal artery stent  2010    left/H&P  . Iliac artery stent  11/2011    right  . Coronary angioplasty with stent placement  12/31/2011    "I have 15 stents; heart, kidney, aorta, legs"  . Colonoscopy w/ biopsies and polypectomy      "have had probably 5 cut out" (12/31/2011)  . Enteroscopy  03/04/2012    Procedure: ENTEROSCOPY;  Surgeon: Inda Castle, MD;  Location: [facility];  Service: Endoscopy;  Laterality: N/A;  jessica/leone  . Esophagogastroduodenoscopy N/A 03/04/2013    Procedure: ESOPHAGOGASTRODUODENOSCOPY (EGD);  Surgeon: Inda Castle, MD;  Location: [facility];  Service: Endoscopy;  Laterality: N/A;  . Colonoscopy N/A 03/05/2013    Procedure: COLONOSCOPY;  Surgeon: Inda Castle, MD;  Location: [facility];  Service: Endoscopy;  Laterality: N/A;   Family History  Problem Relation Age of Onset  . Lung cancer Brother   . Diabetes Mother   . Arthritis Mother   . Heart attack Mother   . Heart disease Sister   . Stomach cancer Maternal Grandfather   . Heart disease Father   . Heart attack Father   . Heart disease Brother   . ALS Sister   . ALS      nephew/neice  . Colon cancer Neg Hx    History  Substance Use Topics  . Smoking status: Former Smoker -- 2.50 packs/day for 45 years    Types: Cigarettes    Quit date: 05/25/1990  . Smokeless tobacco: Former Systems developer    Types: Chew    Quit date: 06/01/1990  . Alcohol Use: No     Comment: 12/31/2011 "used to drink; last alcohol has been 30 years I imagine"    Review of Systems  Constitutional: Negative for fever and diaphoresis.  HENT: Negative for sore throat.   Respiratory: Positive for cough (chronic white production non bloody), sputum production and shortness of breath. Negative for hemoptysis and wheezing.   Cardiovascular: Positive for chest pain and PND (baseline on CPAP at night). Negative for  orthopnea and near-syncope.  Gastrointestinal: Positive for vomiting, abdominal pain (LLQ) and blood in stool (melena). Negative for nausea.  Musculoskeletal: Negative for back pain.  Neurological: Negative for numbness.      Allergies  Donepezil; Heparin; Hydralazine; Hydromorphone; Morphine; Plavix; Promethazine; Sulfonamide derivatives; Cefuroxime; Metoprolol; and Ertapenem  Home Medications   Prior to Admission medications   Medication  Sig Start Date End Date Taking? Authorizing Provider  acetaminophen (TYLENOL) 650 MG CR tablet Take 1,300 mg by mouth 2 (two) times daily.    Historical Provider, MD  albuterol (PROAIR HFA) 108 (90 BASE) MCG/ACT inhaler Inhale 2 puffs into the lungs daily as needed for wheezing or shortness of breath.    Historical Provider, MD  albuterol (PROVENTIL) (2.5 MG/3ML) 0.083% nebulizer solution Take 2.5 mg by nebulization 3 (three) times daily.     Historical Provider, MD  allopurinol (ZYLOPRIM) 300 MG tablet Take 300 mg by mouth 2 (two) times daily.    Historical Provider, MD  amLODipine (NORVASC) 10 MG tablet Take 10 mg by mouth daily.    Historical Provider, MD  Artificial Tear Ointment (DRY EYES OP) Place 2 drops into both eyes daily as needed (for dry eyes).    Historical Provider, MD  aspirin EC 325 MG tablet Take 1 tablet (325 mg total) by mouth daily. 03/19/13   Bobby Rumpf [place], PA-C  atorvastatin (LIPITOR) 20 MG tablet Take 20 mg by mouth daily.     Historical Provider, MD  B-D UF III MINI PEN NEEDLES 31G X 5 MM MISC 1 each by Other route See admin instructions. Use with insulin pens daily. 03/14/13   Historical Provider, MD  cloNIDine HCl (KAPVAY) 0.1 MG TB12 ER tablet Take 1 tablet (0.1 mg total) by mouth 2 (two) times daily. 12/11/11   Tarri Fuller, PA-C  colchicine 0.6 MG tablet Take 0.6 mg by mouth 2 (two) times daily.    Historical Provider, MD  escitalopram (LEXAPRO) 20 MG tablet Take 20 mg by mouth daily.    Historical Provider, MD  ezetimibe  (ZETIA) 10 MG tablet Take 10 mg by mouth daily.     Historical Provider, MD  folic acid (FOLVITE) 503 MCG tablet Take 1,200 mcg by mouth daily.     Historical Provider, MD  furosemide (LASIX) 80 MG tablet Take 1 tablet (80 mg total) by mouth 2 (two) times daily. 06/29/12   Carole Civil, MD  gabapentin (NEURONTIN) 100 MG tablet Take 100 mg by mouth at bedtime.     Historical Provider, MD  guaifenesin (ROBITUSSIN) 100 MG/5ML syrup Take 200 mg by mouth 3 (three) times daily as needed for cough.    Historical Provider, MD  insulin aspart (NOVOLOG) 100 UNIT/ML injection Inject 20 Units into the skin 3 (three) times daily as needed for high blood sugar.     Historical Provider, MD  insulin glargine (LANTUS) 100 UNIT/ML injection Inject 62 Units into the skin 2 (two) times daily.    Historical Provider, MD  isosorbide mononitrate (IMDUR) 60 MG 24 hr tablet Take 60 mg by mouth daily.    Historical Provider, MD  metolazone (ZAROXOLYN) 2.5 MG tablet Take 2.5 mg by mouth See admin instructions. Take 30 minutes prior to morning furosemide dose for a weight gain of 3 lbs or more as needed    Historical Provider, MD  Multiple Vitamin (MULTIVITAMIN WITH MINERALS) TABS Take 0.5 tablets by mouth 2 (two) times daily.    Historical Provider, MD  nitroGLYCERIN (NITROSTAT) 0.4 MG SL tablet Place 0.4 mg under the tongue every 5 (five) minutes as needed for chest pain. May repeat up to 3 times    Historical Provider, MD  ONE TOUCH ULTRA TEST test strip 1 each by Other route See admin instructions. Check blood sugar 4 times daily. 03/22/13   Historical Provider, MD  pantoprazole (PROTONIX) 40 MG tablet Take 40 mg  by mouth daily.     Historical Provider, MD  predniSONE (DELTASONE) 5 MG tablet Take 5 mg by mouth 2 (two) times daily.  11/07/12   Historical Provider, MD  tiotropium (SPIRIVA) 18 MCG inhalation capsule Place 18 mcg into inhaler and inhale at bedtime.    Historical Provider, MD   BP 139/41  Pulse 78   Temp(Src) 98.2 F (36.8 C) (Oral)  Resp 18  SpO2 98% Physical Exam  Nursing note and vitals reviewed. Constitutional: He is oriented to person, place, and time. He appears well-developed and well-nourished. No distress.  HENT:  Head: Normocephalic and atraumatic.  Nose: Nose normal.  Eyes: Conjunctivae are normal. Pupils are equal, round, and reactive to light.  Neck: Normal range of motion. Neck supple. No JVD present. No tracheal deviation present.  Cardiovascular: Normal rate, regular rhythm and normal heart sounds.   No murmur heard. Pulmonary/Chest: Effort normal and breath sounds normal. No respiratory distress. He has no wheezes. He has no rales. He exhibits no tenderness.  Abdominal: Soft. Bowel sounds are normal. He exhibits no distension and no mass. There is tenderness (LLQ). There is no rebound and no guarding.  Obese.  Ecchymoses and induration suprapubically at injection sites  Genitourinary: Guaiac positive stool.  Musculoskeletal: Normal range of motion. He exhibits edema (trace). He exhibits no tenderness.  Neurological: He is alert and oriented to person, place, and time.  Skin: Skin is warm and dry. No rash noted.  B/l venous stasis changes  Psychiatric: He has a normal mood and affect.    ED Course  Procedures (including critical care time) Labs Review Labs Reviewed  CBC - Abnormal; Notable for the following:    RBC 3.52 (*)    Hemoglobin 10.9 (*)    HCT 33.2 (*)    RDW 18.2 (*)    All other components within normal limits  BASIC METABOLIC PANEL - Abnormal; Notable for the following:    Sodium 135 (*)    Chloride 90 (*)    Glucose, Bld 279 (*)    BUN 57 (*)    Creatinine, Ser 2.37 (*)    GFR calc non Af Amer 25 (*)    GFR calc Af Amer 29 (*)    Anion gap 18 (*)    All other components within normal limits  PRO B NATRIURETIC PEPTIDE - Abnormal; Notable for the following:    Pro B Natriuretic peptide (BNP) 631.8 (*)    All other components within  normal limits  I-STAT TROPOININ, ED  I-STAT CG4 LACTIC ACID, ED    Imaging Review No results found.   EKG Interpretation   Date/Time:  Monday February 12 2014 15:41:16 EDT Ventricular Rate:  85 PR Interval:  156 QRS Duration: 92 QT Interval:  396 QTC Calculation: 471 R Axis:   50 Text Interpretation:  Sinus rhythm with frequent Premature ventricular  complexes and Premature atrial complexes ST \\T \ T wave abnormality,  consider inferolateral ischemia Prolonged QT Abnormal ECG No change vs  11-2013 Confirmed by Jeneen Rinks  MD, [place] ([id number]) on 02/12/2014 5:26:14 PM      MDM   Final diagnoses:  Left lower quadrant pain  Coronary artery disease involving autologous artery coronary bypass graft without angina pectoris  Other chest pain  HLD (hyperlipidemia)  Essential hypertension  Chronic GI bleed   CAD s/p CABG. High risk chest pain.  Chest pain resolved with rest.  Doubt dissection due to well appearance. No PNA seen on CXR. EKG unchanged compared  to prior. No signs of peritonitis or systemic toxicity to suggest surgical emergency.   TTP in LLQ, with hx of AVMs and afib, concern for occult intrabdominal source.  CKD precludes IV contrast.    GI Bleed on ASA. Pt reports melena. Pt with known hx of colonic AVMs chronically bleeding. Hemoccult positive.  Anemia above baseline  9:07 PM Desaturated to 84% on movement to side of bed with good waveform.  Spoke with cardiologist who recommend delta troponin and admission to hospitalist service for further management of other co-morbidities.    Tammy Sours, MD 02/13/14 [phone]

## 2014-02-12 NOTE — H&P (Addendum)
[facility] History and Physical  TAFARI HUMISTON EVO:[id number] DOB: 1933-09-23 DOA: 02/12/2014  Referring physician: ED physician PCP: [name], MD  Specialists:   Chief Complaint: chest pain and abdominal pain  HPI: [name] is a 80 y.o. male with past medical history of chronic GI bleeding, hx of AVM, type 2 diabetes, history of A. fib, diastolic congestive heart failure (2-D echo on 11/29/13 showed EF 29-93% with diastolic dysfunction) and CABG, who presents with abdominal pain and chest pain.  Regarding his chest pain, patient has been having mild, intermittent chest pain for more than 3 months. It is located at the left lower chest. It happens approximately one to 2 times per day, each time lasts about 2-3 hours. The chest pain is mild, most of the time 5/10 in severity, pressure-like and nonradiating. It is not aggravated by any known factors. It is not pleuritic and deep breath does not make it worse. Patient has chronic cough due to chronic bronchitis, which has not been worsening. He does not have fever or chills.  Regarding his abdominal pain, he has mild left lower quadrant abdominal pain in the past 3 days. It happened gradually. He also has mild nausea and vomited once without blood in the vomitus. He noticed black stool in the past 3 days. He does not have dizziness and shortness of breath. His hemoglobin is stable (10.4 on 02/09/14 to 10.3 on admission). CT abdomen/pelvis showed no acute findings in the abdomen or pelvis. Patient is admitted to inpatient for further evaluation and treatment.   Review of Systems: As presented in the history of presenting illness, rest negative.  Where does patient live?  Lives with his wife at home Can patient participate in ADLs? Yes  Allergy:  Allergies  Allergen Reactions  . Donepezil Diarrhea  . Heparin Rash and Other (See Comments)    Family stated it almost killed him. Hallucinations and itching.  Marland Kitchen Hydralazine  Other (See Comments)    Do not ever take per Dr. Melvern Banker, cardiologist  . Hydromorphone Itching  . Morphine Itching  . Plavix [Clopidogrel Bisulfate] Other (See Comments)    GI BLEEDING; "so bad I had to take blood; dr took me off it"  . Promethazine Other (See Comments)    respiratory failure  . Sulfonamide Derivatives Itching  . Cefuroxime Other (See Comments)     unkown reaction  . Metoprolol Other (See Comments)    unknown  . Ertapenem Itching and Rash    12/31/2011 pt does not recall allergy or severity    Past Medical History  Diagnosis Date  . Adenomatous colon polyp   . Diverticulosis   . GERD (gastroesophageal reflux disease)   . Hiatal hernia   . Alzheimer disease   . Gout     "hands; backbone; elbows; shoulders"  . Asthma with bronchitis   . COPD (chronic obstructive pulmonary disease)   . Chronic hypoxemic respiratory failure   . CHF (congestive heart failure)     grade 2 diastolic dysfunction, EF 71-69% on ECHO 12/2011  . Blood transfusion     "I've had about 6 pints; related to a GI bleed; colon bleeding"   . Anemia   . GI bleed     GI [name],   . Hypertension   . Peripheral vascular disease     PAD  . Dysrhythmia     "skips"  . Anginal pain   . Myocardial infarction     "they said I had 2  light ones"  . Shortness of breath 12/31/2011    "all the time"  . Type II diabetes mellitus     with complications:  retinopathy, nephrophathy.   . Arthritis     "plenty"  . Chronic lower back pain   . Renal insufficiency     "went on dialysis probably 3 times" Last 04/2011  . Skin cancer     "forehead; arms"  . Depression   . Diverticulosis   . Colon polyps     adenomatous and hyperplastic  . GERD (gastroesophageal reflux disease)   . Hiatal hernia   . Memory deficits 01/11/2013  . Obesity   . Carotid bruit     bilateral  . CKD (chronic kidney disease) 03/07/2013  . Hemorrhoids   . AVM (arteriovenous malformation) of colon   . Gastric polyp   . Atrial  fibrillation   . High cholesterol   . Pneumonia     "have had it several times"  . Chronic bronchitis   . OSA on CPAP   . On home oxygen therapy     "3L just at night" (10/16/2013)    Past Surgical History  Procedure Laterality Date  . Hand surgery Left 1990s    "chain saw accident; had to reattach fingers on left hand"  . Esophagogastroduodenoscopy  05/05/2011    Procedure: ESOPHAGOGASTRODUODENOSCOPY (EGD);  Surgeon: Inda Castle, MD;  Location: [facility];  Service: Endoscopy;  Laterality: N/A;  . Flexible sigmoidoscopy  05/05/2011    Procedure: FLEXIBLE SIGMOIDOSCOPY;  Surgeon: Inda Castle, MD;  Location: [facility];  Service: Endoscopy;  Laterality: N/A;  . Laparoscopic cholecystectomy  ~ 2001  . Excisional hemorrhoidectomy  1950's  . Cataract extraction w/ intraocular lens  implant, bilateral Bilateral   . Coronary artery bypass graft  1997    CABG X2 vessels  . Iliac artery stent  11/2011    left/H&P  . Renal artery stent  2010    left/H&P  . Iliac artery stent  11/2011    right  . Coronary angioplasty with stent placement  12/31/2011    "I have 15 stents; heart, kidney, aorta, legs"  . Colonoscopy w/ biopsies and polypectomy      "have had probably 5 cut out" (12/31/2011)  . Enteroscopy  03/04/2012    Procedure: ENTEROSCOPY;  Surgeon: Inda Castle, MD;  Location: [facility];  Service: Endoscopy;  Laterality: N/A;  jessica/leone  . Esophagogastroduodenoscopy N/A 03/04/2013    Procedure: ESOPHAGOGASTRODUODENOSCOPY (EGD);  Surgeon: Inda Castle, MD;  Location: [place];  Service: Endoscopy;  Laterality: N/A;  . Colonoscopy N/A 03/05/2013    Procedure: COLONOSCOPY;  Surgeon: Inda Castle, MD;  Location: [place];  Service: Endoscopy;  Laterality: N/A;    Social History:  reports that he quit smoking about 23 years ago. His smoking use included Cigarettes. He has a 112.5 pack-year smoking history. He quit smokeless tobacco use about 23 years ago. His  smokeless tobacco use included Chew. He reports that he does not drink alcohol or use illicit drugs.  Family History:  Family History  Problem Relation Age of Onset  . Lung cancer Brother   . Diabetes Mother   . Arthritis Mother   . Heart attack Mother   . Heart disease Sister   . Stomach cancer Maternal Grandfather   . Heart disease Father   . Heart attack Father   . Heart disease Brother   . ALS Sister   . ALS  nephew/neice  . Colon cancer Neg Hx      Prior to Admission medications   Medication Sig Start Date End Date Taking? Authorizing Provider  acetaminophen (TYLENOL) 650 MG CR tablet Take 1,300 mg by mouth 2 (two) times daily.   Yes Historical Provider, MD  albuterol (PROAIR HFA) 108 (90 BASE) MCG/ACT inhaler Inhale 2 puffs into the lungs 2 (two) times daily as needed for wheezing or shortness of breath.    Yes Historical Provider, MD  albuterol (PROVENTIL) (2.5 MG/3ML) 0.083% nebulizer solution Take 2.5 mg by nebulization 2 (two) times daily.    Yes Historical Provider, MD  allopurinol (ZYLOPRIM) 300 MG tablet Take 300 mg by mouth 2 (two) times daily.   Yes Historical Provider, MD  amLODipine (NORVASC) 10 MG tablet Take 10 mg by mouth daily.   Yes Historical Provider, MD  aspirin EC 325 MG tablet Take 1 tablet (325 mg total) by mouth daily. 03/19/13  Yes Marianne L York, PA-C  atorvastatin (LIPITOR) 20 MG tablet Take 20 mg by mouth at bedtime.    Yes Historical Provider, MD  cloNIDine HCl (KAPVAY) 0.1 MG TB12 ER tablet Take 1 tablet (0.1 mg total) by mouth 2 (two) times daily. 12/11/11  Yes Tarri Fuller, PA-C  colchicine 0.6 MG tablet Take 0.6 mg by mouth 2 (two) times daily.   Yes Historical Provider, MD  escitalopram (LEXAPRO) 20 MG tablet Take 20 mg by mouth daily.   Yes Historical Provider, MD  ezetimibe (ZETIA) 10 MG tablet Take 10 mg by mouth daily.    Yes Historical Provider, MD  folic acid (FOLVITE) 662 MCG tablet Take 1,200 mcg by mouth daily.    Yes Historical  Provider, MD  furosemide (LASIX) 80 MG tablet Take 1 tablet (80 mg total) by mouth 2 (two) times daily. 06/29/12  Yes Carole Civil, MD  gabapentin (NEURONTIN) 100 MG tablet Take 100 mg by mouth at bedtime.    Yes Historical Provider, MD  guaifenesin (ROBITUSSIN) 100 MG/5ML syrup Take 200 mg by mouth 3 (three) times daily as needed for cough.   Yes Historical Provider, MD  insulin aspart (NOVOLOG FLEXPEN) 100 UNIT/ML FlexPen Inject 31-32 Units into the skin 3 (three) times daily with meals. Based on sugar levels   Yes Historical Provider, MD  Insulin Glargine (LANTUS SOLOSTAR) 100 UNIT/ML Solostar Pen Inject 68 Units into the skin 2 (two) times daily.   Yes Historical Provider, MD  isosorbide mononitrate (IMDUR) 60 MG 24 hr tablet Take 60 mg by mouth daily.   Yes Historical Provider, MD  metolazone (ZAROXOLYN) 2.5 MG tablet Take 2.5 mg by mouth See admin instructions. Take 30 minutes prior to morning furosemide dose for a weight gain of 2-3 lbs or more as needed   Yes Historical Provider, MD  Multiple Vitamin (MULTIVITAMIN WITH MINERALS) TABS Take 0.5 tablets by mouth 2 (two) times daily.   Yes Historical Provider, MD  nitroGLYCERIN (NITROSTAT) 0.4 MG SL tablet Place 0.4 mg under the tongue every 5 (five) minutes as needed for chest pain. May repeat up to 3 times   Yes Historical Provider, MD  nystatin (MYCOSTATIN) 100000 UNIT/ML suspension Take by mouth daily as needed (thrush from breathing treatment).  12/19/13  Yes Historical Provider, MD  pantoprazole (PROTONIX) 40 MG tablet Take 40 mg by mouth daily.    Yes Historical Provider, MD  predniSONE (DELTASONE) 5 MG tablet Take 5 mg by mouth daily.  11/07/12  Yes Historical Provider, MD  tiotropium ([place]) 18 MCG  inhalation capsule Place 18 mcg into inhaler and inhale at bedtime.   Yes Historical Provider, MD  B-D UF III MINI PEN NEEDLES 31G X 5 MM MISC 1 each by Other route See admin instructions. Use with insulin pens daily. 03/14/13   Historical  Provider, MD  ONE TOUCH ULTRA TEST test strip 1 each by Other route See admin instructions. Check blood sugar 4 times daily. 03/22/13   Historical Provider, MD    Physical Exam: Filed Vitals:   02/12/14 2130 02/12/14 2233 02/12/14 2251 02/12/14 2301  BP: 152/53     Pulse: 65  72   Temp:      TempSrc:      Resp: 16  17   Height:    5\' 6"  (1.676 m)  SpO2: 97% 95% 96%    General: Not in acute distress HEENT:       Eyes: PERRL, EOMI, no scleral icterus       ENT: No discharge from the ears and nose, no pharynx injection, no tonsillar enlargement.        Neck: No JVD, no bruit, no mass felt. Cardiac: S1/S2, regular rhythm with occasional premature beats, has 2/6 systolic murmur over aortic valve area. No gallops or rubs Pulm: Good air movement bilaterally. Clear to auscultation bilaterally. No rales, wheezing, rhonchi or rubs. Abd: Soft, distended, mild tender over LLQ, no rebound pain, no organomegaly, BS present Ext: No edema. 2+DP/PT pulse bilaterally Musculoskeletal: No joint deformities, erythema, or stiffness, ROM full Skin: No rashes.  Neuro: Alert and oriented X3, cranial nerves II-XII grossly intact, muscle strength 5/5 in all extremeties, sensation to light touch intact. Brachial reflex 2+ bilaterally. Knee reflex 1+ bilaterally. Negative Babinski's sign. Normal finger to nose test. Psych: Patient is not psychotic, no suicidal or hemocidal ideation.  Labs on Admission:  Basic Metabolic Panel:  Recent Labs Lab 02/12/14 1556  NA 135*  K 4.1  CL 90*  CO2 27  GLUCOSE 279*  BUN 57*  CREATININE 2.37*  CALCIUM 9.9   Liver Function Tests: No results found for this basename: AST, ALT, ALKPHOS, BILITOT, PROT, ALBUMIN,  in the last 168 hours No results found for this basename: LIPASE, AMYLASE,  in the last 168 hours No results found for this basename: AMMONIA,  in the last 168 hours CBC:  Recent Labs Lab 02/09/14 0815 02/12/14 1103 02/12/14 1556  WBC 9.7 11.0* 10.3   NEUTROABS 6.5 7.1*  --   HGB 10.4* 11.1* 10.9*  HCT 32.6* 33.6* 33.2*  MCV 96.2 94.4 94.3  PLT 199 213 232   Cardiac Enzymes: No results found for this basename: CKTOTAL, CKMB, CKMBINDEX, TROPONINI,  in the last 168 hours  BNP (last 3 results)  Recent Labs  10/14/13 1625 12/11/13 1235 02/12/14 1556  PROBNP 895.7* 1685.0* 631.8*   CBG: No results found for this basename: GLUCAP,  in the last 168 hours  Radiological Exams on Admission: Ct Abdomen Pelvis Wo Contrast  02/12/2014   CLINICAL DATA:  Left upper quadrant abdominal pain.  EXAM: CT ABDOMEN AND PELVIS WITHOUT CONTRAST  TECHNIQUE: Multidetector CT imaging of the abdomen and pelvis was performed following the standard protocol without IV contrast.  COMPARISON:  CT of the abdomen 08/07/2008.  FINDINGS: Lung Bases: Patchy areas of ground-glass attenuation and septal thickening in the lung bases bilaterally, suspicious for underlying interstitial lung disease. 4 mm nodule in the right lower lobe (image 5 of series 3). Small hiatal hernia. Cardiomegaly. Extensive mitral annular calcifications. Pericardial calcifications along  the anterior and inferior wall of the right ventricle. Atherosclerotic calcifications in the thoracic aorta and right coronary artery.  Abdomen/Pelvis: Status post cholecystectomy. The unenhanced appearance of the liver, pancreas, spleen, bilateral adrenal glands and the right kidney is unremarkable. There is an exophytic 2.1 cm low-attenuation lesion in the lower pole of the left kidney which is incompletely characterized on today's noncontrast CT examination, but appears only slightly larger than remote prior study from 2010, presumably a small cyst. Extensive atherosclerosis throughout the abdominal and pelvic vasculature, without evidence of aneurysm. Bilateral renal artery and common iliac artery stents are noted. No significant volume of ascites. No pneumoperitoneum. No pathologic distention of small bowel. No  lymphadenopathy noted in the abdomen or pelvis on today's non contrast CT examination. Prostate gland and urinary bladder are unremarkable in appearance.  Musculoskeletal: There are no aggressive appearing lytic or blastic lesions noted in the visualized portions of the skeleton.  IMPRESSION: 1. No acute findings in the abdomen or pelvis to account for the patient's symptoms. 2. Status post cholecystectomy. 3. Extensive atherosclerosis, including right coronary artery disease. 4. Extensive pericardial calcifications again noted overlying the right ventricle. No gross morphologic changes in the heart to strongly suggest the presence of constrictive physiology at this time. 5. The appearance of the lung bases is suggestive of underlying interstitial lung disease, however, this is incompletely evaluated on today's examination. A followup nonemergent high-resolution chest CT should be considered in the near future for more definitive evaluation if clinically appropriate. 6. Cardiomegaly.   Electronically Signed   By: Vinnie Langton M.D.   On: 02/12/2014 19:39   Dg Chest 2 View  02/12/2014   CLINICAL DATA:  Chest pain, shortness of breath, history of open heart surgery  EXAM: CHEST  2 VIEW  COMPARISON:  12/11/2013  FINDINGS: Cardiomegaly with possible mild interstitial edema, although the appearance may also reflect chronic compensated heart failure. No pleural effusion or pneumothorax.  Postsurgical changes related to prior CABG.  IMPRESSION: Cardiomegaly with possible mild interstitial edema, equivocal.   Electronically Signed   By: Julian Hy M.D.   On: 02/12/2014 18:27    EKG: Independently reviewed. No significant change compared with previous EKG.   Assessment/Plan Principal Problem:   Chest pain Active Problems:   PVD, widespread, ISR bilat Iliac stents, re stented 12/11/11   S/P CABG x 3 '97, PCI '09, cath 4/11- medical Rx   Chronic renal insufficiency, stage III (moderate) - Baseline  creatinine roughly 1.6   Paroxysmal atrial fibrillation   Arteriovenous malformation of colon with hemorrhage   Abdominal pain   1. Chest pain: Patient has atypical chest pain, without new EKG change (patient had old T-wave inversion in inferior leads and precordial leads from V4 to V6). Given his significant history of CAD and s/p CABG, it is important to rule out ACS. Other DD include, but less likely, PE (chest is not pleuratic, patient does not have tachycardia, oxygen saturation is 98% at room air, Well's score is low probability), pneumonia (no leukocytosis, fever, and a negative chest x-ray for  Infiltration).   - Admit to telemetry bed - cycle CE q6 x3 and repeat her EKG in the am  - Nitroglycerin, and aspirin, lipitor  - Consider cardiology consult if test positive for CEs   2. Abdominal pain and GIB:  etiology is not clear at this moment. Patient has history of AVM (s/p of cauterization). He has mild tenderness at the left lower quadrant, which may indicate the possibility  of left colon abnormalities, such as diverticulosis and AVM. Patient has chronic and mild GI bleeding which may have partially contributed. He has positive FOBT on admission, but with stable hemoglobin level. CT-add/pelvis showed no acute findings. Other differential diagnoses include pancreatitis and ischemic bowel, though it is less likely given normal lactate level on admission  - NPO - will check lipase - trend Hgb level by CBC q8h - type and screen - may call GI in AM or follow up with GI as outpatient if Hgb stable in AM - will not give IVF given hx of dCHF, but will d/c metolazone - continue home PPI  3. CKD-III: Baseline creatinine 1.6-2.3. His creatinine is 2.37 on admission, which is close to his baseline - will monitor renal function by BMP - hold home metolazone, but continue Lasix 80 mg bid which is for dCHF - monitor volume status carefully. Currently he is euvolemic.  4. PAF: Heart rate is well  controlled. Patient is not on Coumadin at home, possibly because of the chronic GI bleeding - monitor HR on tele  5. HTN: Blood pressure 136/49 on admission. - Continue home medications  6. DM-II: On Lantus 68 units twice a day at home. Last A1c was 9.9 - will decrease lantus from 68 to 60 U while on NPO - SSI - check A1c  7. chronic bronchitis: stable. - Continue home breathing treatment regimen  DVT ppx: SCD, no SQ Heparin or Lovenox due to GIB  Code Status: Full code Family Communication: Yes, patient's wife at bed side Disposition Plan: Admit to inpatient  Ivor Costa [facility] Pager [phone]  If 7PM-7AM, please contact night-coverage [email or web address] Password [facility] 02/12/2014, 11:04 PM

## 2014-02-13 ENCOUNTER — Encounter (HOSPITAL_COMMUNITY): Payer: Self-pay | Admitting: Physician Assistant

## 2014-02-13 DIAGNOSIS — N183 Chronic kidney disease, stage 3 unspecified: Secondary | ICD-10-CM

## 2014-02-13 DIAGNOSIS — R079 Chest pain, unspecified: Principal | ICD-10-CM

## 2014-02-13 DIAGNOSIS — I4891 Unspecified atrial fibrillation: Secondary | ICD-10-CM

## 2014-02-13 DIAGNOSIS — R072 Precordial pain: Secondary | ICD-10-CM

## 2014-02-13 DIAGNOSIS — K922 Gastrointestinal hemorrhage, unspecified: Secondary | ICD-10-CM

## 2014-02-13 LAB — CBC
HCT: 31 % — ABNORMAL LOW (ref 39.0–52.0)
HEMOGLOBIN: 10.4 g/dL — AB (ref 13.0–17.0)
MCH: 31.3 pg (ref 26.0–34.0)
MCHC: 33.5 g/dL (ref 30.0–36.0)
MCV: 93.4 fL (ref 78.0–100.0)
PLATELETS: 192 10*3/uL (ref 150–400)
RBC: 3.32 MIL/uL — AB (ref 4.22–5.81)
RDW: 18.2 % — ABNORMAL HIGH (ref 11.5–15.5)
WBC: 8.5 10*3/uL (ref 4.0–10.5)

## 2014-02-13 LAB — COMPREHENSIVE METABOLIC PANEL
ALT: 24 U/L (ref 0–53)
ANION GAP: 13 (ref 5–15)
AST: 44 U/L — ABNORMAL HIGH (ref 0–37)
Albumin: 3.5 g/dL (ref 3.5–5.2)
Alkaline Phosphatase: 69 U/L (ref 39–117)
BILIRUBIN TOTAL: 0.5 mg/dL (ref 0.3–1.2)
BUN: 55 mg/dL — AB (ref 6–23)
CHLORIDE: 95 meq/L — AB (ref 96–112)
CO2: 32 meq/L (ref 19–32)
CREATININE: 2.15 mg/dL — AB (ref 0.50–1.35)
Calcium: 9.5 mg/dL (ref 8.4–10.5)
GFR calc Af Amer: 32 mL/min — ABNORMAL LOW (ref >90)
GFR, EST NON AFRICAN AMERICAN: 28 mL/min — AB (ref >90)
Glucose, Bld: 105 mg/dL — ABNORMAL HIGH (ref 70–99)
Potassium: 3.4 mEq/L — ABNORMAL LOW (ref 3.7–5.3)
Sodium: 140 mEq/L (ref 137–147)
Total Protein: 6.6 g/dL (ref 6.0–8.3)

## 2014-02-13 LAB — TYPE AND SCREEN
ABO/RH(D): O POS
Antibody Screen: NEGATIVE

## 2014-02-13 LAB — TROPONIN I: Troponin I: <0.3 ng/mL (ref <0.30)

## 2014-02-13 LAB — GLUCOSE, CAPILLARY
GLUCOSE-CAPILLARY: 114 mg/dL — AB (ref 70–99)
Glucose-Capillary: 210 mg/dL — ABNORMAL HIGH (ref 70–99)
Glucose-Capillary: 120 mg/dL — ABNORMAL HIGH (ref 70–99)

## 2014-02-13 LAB — MRSA PCR SCREENING: MRSA by PCR: POSITIVE — AB

## 2014-02-13 MED ORDER — ASPIRIN EC 81 MG PO TBEC: 81.0000 mg | DELAYED_RELEASE_TABLET | Freq: Every day | ORAL | Status: AC

## 2014-02-13 NOTE — Consult Note (Signed)
CARDIOLOGY CONSULT NOTE  Patient ID: [name], MRN: [id number], DOB/AGE: 12-21-1933 80 y.o. Admit date: 02/12/2014 Date of Consult: 02/13/2014  Primary Physician: [name], MD Primary Cardiologist: Dr Burt Knack Referring Physician: Dr Blaine Hamper  Chief Complaint: Chest pain/Shortness of breath Reason for Consultation: Chest pain  HPI: 80 year old gentleman well-known to me with extensive coronary artery disease and peripheral arterial disease, COPD, stage IV chronic kidney disease, and morbid obesity. He was hospitalized because of black stools and concern for recurrent GI bleeding. He's had extensive evaluation in the past for the same problem and has been treated for AVMs. He's complained of chest pain, shortness of breath, and generalized weakness. We are asked to see him for further evaluation of his chest pain. He describes pain in the left chest, worse with certain movements. His shortness of breath is long-standing. He denies orthopnea or PND. He admits to progressive leg weakness and gait instability. He is extremely sedentary. His blood glucoses at home and been ranging from the 200s when he is fasting in the morning up to greater than 600 during the day. He continues to he will ever he wants. He has not been interested in any sort of lifestyle modification  been following him over the past few years.  Medical History:  Past Medical History  Diagnosis Date  . Adenomatous colon polyp   . Diverticulosis   . GERD (gastroesophageal reflux disease)   . Hiatal hernia   . Gout     "hands; backbone; elbows; shoulders"  . Asthma with bronchitis   . COPD (chronic obstructive pulmonary disease)   . Chronic hypoxemic respiratory failure   . CHF (congestive heart failure)     grade 2 diastolic dysfunction, EF 27-25% on ECHO 12/2011  . Anemia     transfusion requiring  . GI bleed     GI [name],   . Hypertension   . Peripheral vascular disease     PAD  . Dysrhythmia    "skips"  . Anginal pain   . Myocardial infarction     "they said I had 2 light ones"  . Shortness of breath 12/31/2011    on nocturnal 3L just at night" (10/16/2013)  . Type II diabetes mellitus     with complications:  retinopathy, nephrophathy.   . Arthritis     "plenty"  . Chronic lower back pain   . Renal insufficiency     "went on dialysis probably 3 times" Last 04/2011  . Skin cancer     "forehead; arms"  . Depression   . Colon polyps 2007, 2014    adenomatous and hyperplastic  . GERD (gastroesophageal reflux disease)     hiatal hernia.   . Memory deficits 01/11/2013  . Obesity   . Carotid bruit     bilateral  . CKD (chronic kidney disease) 03/07/2013  . Hemorrhoids   . GI AVM (gastrointestinal arteriovenous vascular malformation) 02/2013  . Gastric polyp 2012    colonic, gastric.   . Atrial fibrillation   . High cholesterol   . Pneumonia     "have had it several times"  . Chronic bronchitis   . OSA on CPAP   . Alzheimer disease       Surgical History:  Past Surgical History  Procedure Laterality Date  . Hand surgery Left 1990s    "chain saw accident; had to reattach fingers on left hand"  . Esophagogastroduodenoscopy  05/05/2011    Procedure: ESOPHAGOGASTRODUODENOSCOPY (EGD);  Surgeon: Herbie Baltimore  Shaaron Adler, MD;  Location: [facility];  Service: Endoscopy;  Laterality: N/A;  . Flexible sigmoidoscopy  05/05/2011    Procedure: FLEXIBLE SIGMOIDOSCOPY;  Surgeon: Inda Castle, MD;  Location: [facility];  Service: Endoscopy;  Laterality: N/A;  . Laparoscopic cholecystectomy  ~ 2001  . Excisional hemorrhoidectomy  1950's  . Cataract extraction w/ intraocular lens  implant, bilateral Bilateral   . Coronary artery bypass graft  1997    CABG X2 vessels  . Iliac artery stent  11/2011    left/H&P  . Renal artery stent  2010    left/H&P  . Iliac artery stent  11/2011    right  . Coronary angioplasty with stent placement  12/31/2011    "I have 15 stents; heart, kidney,  aorta, legs"  . Colonoscopy w/ biopsies and polypectomy      "have had probably 5 cut out" (12/31/2011)  . Enteroscopy  03/04/2012    Procedure: ENTEROSCOPY;  Surgeon: Inda Castle, MD;  Location: [facility];  Service: Endoscopy;  Laterality: N/A;  jessica/leone  . Esophagogastroduodenoscopy N/A 03/04/2013    Procedure: ESOPHAGOGASTRODUODENOSCOPY (EGD);  Surgeon: Inda Castle, MD;  Location: [facility];  Service: Endoscopy;  Laterality: N/A;  . Colonoscopy N/A 03/05/2013    Procedure: COLONOSCOPY;  Surgeon: Inda Castle, MD;  Location: [facility];  Service: Endoscopy;  Laterality: N/A;     Home Meds: Prior to Admission medications   Medication Sig Start Date End Date Taking? Authorizing Provider  acetaminophen (TYLENOL) 650 MG CR tablet Take 1,300 mg by mouth 2 (two) times daily.   Yes Historical Provider, MD  albuterol (PROAIR HFA) 108 (90 BASE) MCG/ACT inhaler Inhale 2 puffs into the lungs 2 (two) times daily as needed for wheezing or shortness of breath.    Yes Historical Provider, MD  albuterol (PROVENTIL) (2.5 MG/3ML) 0.083% nebulizer solution Take 2.5 mg by nebulization 2 (two) times daily.    Yes Historical Provider, MD  allopurinol (ZYLOPRIM) 300 MG tablet Take 300 mg by mouth 2 (two) times daily.   Yes Historical Provider, MD  amLODipine (NORVASC) 10 MG tablet Take 10 mg by mouth daily.   Yes Historical Provider, MD  aspirin EC 325 MG tablet Take 1 tablet (325 mg total) by mouth daily. 03/19/13  Yes Marianne L York, PA-C  atorvastatin (LIPITOR) 20 MG tablet Take 20 mg by mouth at bedtime.    Yes Historical Provider, MD  cloNIDine HCl (KAPVAY) 0.1 MG TB12 ER tablet Take 1 tablet (0.1 mg total) by mouth 2 (two) times daily. 12/11/11  Yes Tarri Fuller, PA-C  colchicine 0.6 MG tablet Take 0.6 mg by mouth 2 (two) times daily.   Yes Historical Provider, MD  escitalopram (LEXAPRO) 20 MG tablet Take 20 mg by mouth daily.   Yes Historical Provider, MD  ezetimibe (ZETIA) 10 MG  tablet Take 10 mg by mouth daily.    Yes Historical Provider, MD  folic acid (FOLVITE) 423 MCG tablet Take 1,200 mcg by mouth daily.    Yes Historical Provider, MD  furosemide (LASIX) 80 MG tablet Take 1 tablet (80 mg total) by mouth 2 (two) times daily. 06/29/12  Yes Carole Civil, MD  gabapentin (NEURONTIN) 100 MG tablet Take 100 mg by mouth at bedtime.    Yes Historical Provider, MD  guaifenesin (ROBITUSSIN) 100 MG/5ML syrup Take 200 mg by mouth 3 (three) times daily as needed for cough.   Yes Historical Provider, MD  insulin aspart (NOVOLOG FLEXPEN) 100 UNIT/ML FlexPen Inject  31-32 Units into the skin 3 (three) times daily with meals. Based on sugar levels   Yes Historical Provider, MD  Insulin Glargine (LANTUS SOLOSTAR) 100 UNIT/ML Solostar Pen Inject 68 Units into the skin 2 (two) times daily.   Yes Historical Provider, MD  isosorbide mononitrate (IMDUR) 60 MG 24 hr tablet Take 60 mg by mouth daily.   Yes Historical Provider, MD  metolazone (ZAROXOLYN) 2.5 MG tablet Take 2.5 mg by mouth See admin instructions. Take 30 minutes prior to morning furosemide dose for a weight gain of 2-3 lbs or more as needed   Yes Historical Provider, MD  Multiple Vitamin (MULTIVITAMIN WITH MINERALS) TABS Take 0.5 tablets by mouth 2 (two) times daily.   Yes Historical Provider, MD  nitroGLYCERIN (NITROSTAT) 0.4 MG SL tablet Place 0.4 mg under the tongue every 5 (five) minutes as needed for chest pain. May repeat up to 3 times   Yes Historical Provider, MD  nystatin (MYCOSTATIN) 100000 UNIT/ML suspension Take by mouth daily as needed (thrush from breathing treatment).  12/19/13  Yes Historical Provider, MD  pantoprazole (PROTONIX) 40 MG tablet Take 40 mg by mouth daily.    Yes Historical Provider, MD  predniSONE (DELTASONE) 5 MG tablet Take 5 mg by mouth daily.  11/07/12  Yes Historical Provider, MD  tiotropium (SPIRIVA) 18 MCG inhalation capsule Place 18 mcg into inhaler and inhale at bedtime.   Yes Historical  Provider, MD  B-D UF III MINI PEN NEEDLES 31G X 5 MM MISC 1 each by Other route See admin instructions. Use with insulin pens daily. 03/14/13   Historical Provider, MD  ONE TOUCH ULTRA TEST test strip 1 each by Other route See admin instructions. Check blood sugar 4 times daily. 03/22/13   Historical Provider, MD    Inpatient Medications:  . albuterol  2.5 mg Nebulization BID  . allopurinol  300 mg Oral BID  . amLODipine  10 mg Oral Daily  . aspirin EC  325 mg Oral Daily  . atorvastatin  20 mg Oral QHS  . cloNIDine HCl  0.1 mg Oral BID  . colchicine  0.6 mg Oral BID  . escitalopram  20 mg Oral Daily  . ezetimibe  10 mg Oral Daily  . folic acid  8,592 mcg Oral Daily  . furosemide  80 mg Oral BID  . gabapentin  100 mg Oral QHS  . insulin aspart  0-15 Units Subcutaneous TID WC  . insulin glargine  60 Units Subcutaneous BID  . isosorbide mononitrate  60 mg Oral Daily  . multivitamin with minerals  1 tablet Oral Daily  . pantoprazole  40 mg Oral Daily  . predniSONE  5 mg Oral Q breakfast  . sodium chloride  3 mL Intravenous Q12H  . sodium chloride  3 mL Intravenous Q12H  . tiotropium  18 mcg Inhalation QHS      Allergies:  Allergies  Allergen Reactions  . Donepezil Diarrhea  . Heparin Rash and Other (See Comments)    Family stated it almost killed him. Hallucinations and itching.  Marland Kitchen Hydralazine Other (See Comments)    Do not ever take per Dr. Melvern Banker, cardiologist  . Hydromorphone Itching  . Morphine Itching  . Plavix [Clopidogrel Bisulfate] Other (See Comments)    GI BLEEDING; "so bad I had to take blood; dr took me off it"  . Promethazine Other (See Comments)    respiratory failure  . Sulfonamide Derivatives Itching  . Cefuroxime Other (See Comments)  unkown reaction  . Metoprolol Other (See Comments)    unknown  . Ertapenem Itching and Rash    12/31/2011 pt does not recall allergy or severity    History   Social History  . Marital Status: Married    Spouse Name:  N/A    Number of Children: 5  . Years of Education: 8 th   Occupational History  . 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 Smoking status: Former Smoker -- 2.50 packs/day for 45 years    Types: Cigarettes    Quit date: 05/25/1990  . Smokeless tobacco: Former Systems developer    Types: Chew    Quit date: 06/01/1990  . Alcohol Use: No     Comment: 12/31/2011 "used to drink; last alcohol has been 30 years I imagine"  . Drug Use: No  . Sexual Activity: No   Other Topics Concern  . Not on file   Social History Narrative   Daily caffeine use.  Lives in a house with his wife.  Uses a cane to ambulate and he has a walker, but he does not use it.  He does not cook or pay bills.  He does not drive.       Family History  Problem Relation Age of Onset  . Lung cancer Brother   . Diabetes Mother   . Arthritis Mother   . Heart attack Mother   . Heart disease Sister   . Stomach cancer Maternal Grandfather   . Heart disease Father   . Heart attack Father   . Heart disease Brother   . ALS Sister   . ALS      nephew/neice  . Colon cancer Neg Hx      Review of Systems: General: negative for chills, fever, night sweats or weight changes.  ENT: negative for rhinorrhea or epistaxis Cardiovascular: See history of present illness. Positive for palpitations Dermatological: negative for rash Respiratory: negative for cough or wheezing GI: negative for nausea, vomiting, diarrhea. Positive for melena GU: no hematuria, urgency, or frequency Neurologic: negative for visual changes, syncope, headache, or dizziness Heme: no easy bruising or bleeding Endo: negative for excessive thirst, thyroid disorder, or flushing Musculoskeletal: Positive for joint pains  All other systems reviewed and are otherwise negative except as noted above.  Physical Exam: Blood pressure 146/55, pulse 57, temperature 98.1 F (36.7 C), temperature source Oral, resp. rate 18, height 5\' 6"  (1.676 m), weight 233  lb 14.5 oz (106.1 kg), SpO2 94.00%. Pt is alert and oriented, obese elderly gentleman, in no distress. HEENT: normal Neck: JVP normal. Carotid upstrokes normal with bilateral bruits. No thyromegaly. Lungs: equal expansion, clear bilaterally CV: Apex is discrete and nondisplaced, RRR with grade 2/6 systolic ejection murmur at the left sternal border Abd: soft, NT, +BS, obese Back: no CVA tenderness Ext: Trace pretibial edema bilaterally Skin: warm and dry without rash Neuro: CNII-XII intact             Strength intact = bilaterally    Labs:  Recent Labs  02/12/14 2225 02/13/14 0426  TROPONINI <0.30 <0.30   Lab Results  Component Value Date   WBC 8.5 02/13/2014   HGB 10.4* 02/13/2014   HCT 31.0* 02/13/2014   MCV 93.4 02/13/2014   PLT 192 02/13/2014    Recent Labs Lab 02/13/14 0737  NA 140  K 3.4*  CL 95*  CO2 32  BUN 55*  CREATININE 2.15*  CALCIUM 9.5  PROT  6.6  BILITOT 0.5  ALKPHOS 69  ALT 24  AST 44*  GLUCOSE 105*   Lab Results  Component Value Date   CHOL 144 09/21/2012   HDL 24* 09/21/2012   LDLCALC UNABLE TO CALCULATE IF TRIGLYCERIDE OVER 400 mg/dL 09/21/2012   TRIG 425* 09/21/2012   Lab Results  Component Value Date   DDIMER 1.39* 11/20/2010    Radiology/Studies:  Ct Abdomen Pelvis Wo Contrast  02/12/2014   CLINICAL DATA:  Left upper quadrant abdominal pain.  EXAM: CT ABDOMEN AND PELVIS WITHOUT CONTRAST  TECHNIQUE: Multidetector CT imaging of the abdomen and pelvis was performed following the standard protocol without IV contrast.  COMPARISON:  CT of the abdomen 08/07/2008.  FINDINGS: Lung Bases: Patchy areas of ground-glass attenuation and septal thickening in the lung bases bilaterally, suspicious for underlying interstitial lung disease. 4 mm nodule in the right lower lobe (image 5 of series 3). Small hiatal hernia. Cardiomegaly. Extensive mitral annular calcifications. Pericardial calcifications along the anterior and inferior wall of the right ventricle.  Atherosclerotic calcifications in the thoracic aorta and right coronary artery.  Abdomen/Pelvis: Status post cholecystectomy. The unenhanced appearance of the liver, pancreas, spleen, bilateral adrenal glands and the right kidney is unremarkable. There is an exophytic 2.1 cm low-attenuation lesion in the lower pole of the left kidney which is incompletely characterized on today's noncontrast CT examination, but appears only slightly larger than remote prior study from 2010, presumably a small cyst. Extensive atherosclerosis throughout the abdominal and pelvic vasculature, without evidence of aneurysm. Bilateral renal artery and common iliac artery stents are noted. No significant volume of ascites. No pneumoperitoneum. No pathologic distention of small bowel. No lymphadenopathy noted in the abdomen or pelvis on today's non contrast CT examination. Prostate gland and urinary bladder are unremarkable in appearance.  Musculoskeletal: There are no aggressive appearing lytic or blastic lesions noted in the visualized portions of the skeleton.  IMPRESSION: 1. No acute findings in the abdomen or pelvis to account for the patient's symptoms. 2. Status post cholecystectomy. 3. Extensive atherosclerosis, including right coronary artery disease. 4. Extensive pericardial calcifications again noted overlying the right ventricle. No gross morphologic changes in the heart to strongly suggest the presence of constrictive physiology at this time. 5. The appearance of the lung bases is suggestive of underlying interstitial lung disease, however, this is incompletely evaluated on today's examination. A followup nonemergent high-resolution chest CT should be considered in the near future for more definitive evaluation if clinically appropriate. 6. Cardiomegaly.   Electronically Signed   By: Vinnie Langton M.D.   On: 02/12/2014 19:39   Dg Chest 2 View  02/12/2014   CLINICAL DATA:  Chest pain, shortness of breath, history of open  heart surgery  EXAM: CHEST  2 VIEW  COMPARISON:  12/11/2013  FINDINGS: Cardiomegaly with possible mild interstitial edema, although the appearance may also reflect chronic compensated heart failure. No pleural effusion or pneumothorax.  Postsurgical changes related to prior CABG.  IMPRESSION: Cardiomegaly with possible mild interstitial edema, equivocal.   Electronically Signed   By: Julian Hy M.D.   On: 02/12/2014 18:27    EKG: Sinus rhythm 72 beats per minute, LVH with repolarization abnormality, occasional PVC  Cardiac Studies: 2-D echocardiogram 11/29/2013: Study Conclusions  - Left ventricle: The cavity size was normal. There was mild focal basal hypertrophy of the septum. Systolic function was normal. The estimated ejection fraction was in the range of 55% to 60%. Doppler parameters are consistent with both elevated ventricular end-diastolic  filling pressure and elevated left atrial filling pressure. - Aortic valve: There was trivial regurgitation. - Mitral valve: Calcified annulus. - Left atrium: The atrium was moderately dilated. - Atrial septum: There was increased thickness of the septum, consistent with lipomatous hypertrophy. No defect or patent foramen ovale was identified. - Pulmonary arteries: PA peak pressure: 42 mm Hg (S).  ASSESSMENT AND PLAN:  1. Atypical chest pain. The patient does have extensive CAD and previous CABG. His current symptoms are somewhat atypical and sound more musculoskeletal in nature. His troponins are negative and EKG is unchanged. I do not feel he needs further inpatient workup. From a cardiac perspective, he is stable for discharge home.  2. Chronic diastolic heart failure. This is multifactorial in this gentleman with morbid obesity, high sodium intake, chronic kidney disease, and extensive cardiovascular disease. Will continue with medical management. The patient does not appear acutely volume overloaded at this time.  3. Chronic kidney  disease, stage IV. Continue with blood pressure control and medical therapy. The patient is not a candidate for ACE or ARB with his previous history of temporary dialysis and risk of acute kidney injury on top of his chronic kidney disease.  4. Essential hypertension, malignant. Medications reviewed. Would continue the same program.  5. Frequent palpitations with known history of PAF. Recommend a 48-hour Holter monitor to evaluate his heart rhythm. May need to consider antiarrhythmic drug therapy if he is having frequent paroxysms of atrial fibrillation. I will arrange the monitor.  From cardiac perspective, he is stable for discharge. He has follow-up scheduled with me next month.  Signed, Sherren Mocha MD, [facility] 02/13/2014, 1:46 PM

## 2014-02-13 NOTE — Progress Notes (Signed)
Nursing note patient given discharge instructions, and AVS, Medication list. All questions were answered will discharge home as ordered. Birttany Dechellis, Bettina Gavia RN

## 2014-02-13 NOTE — Consult Note (Signed)
[name] Consult: 12:02 PM 02/13/2014  LOS: 1 day    Referring Provider: Dr Karleen Hampshire  Primary Care Physician:  [name], MD Primary Gastroenterologist:  [name]     Reason for Consultation:  Abdominal pain and FOBT +   HPI: [name] is a 80 y.o. male male with hx CAD/ multiple stents, PVD with previous stents to renal and common iliac arteries,COPD, diastolic failure (2-D echo 11/29/13: EF 10-27% with diastolic dysfunction),A fib, IDDM, and CKD stage 3. S/p cholecystectomy. Chronic low dose prednisone for lung disease. He is known to our practice for a history of adenomatous colon polyps (year 2000, 2007) and recurrent gastrointestinal bleeding, from intestinal AVMs.  S/p innumerable upper and lower endoscopic evaluations. Latest colonoscopy and EGD of 02/2013 revealed large cecal AVM that was cauterized and presumed source of anemia and bleeding.  Do not find capsule endoscopy.  Only blood thinner/platelet disruptor is ASA 325. On daily Protonix.  Followed at cancer center by Dr [name] for anemia. Underwent iron infusion x 3 (11/10/2013, 12/15/2013 01/10/2014).   PRBC x one on 7/17 (hgb 8.0 pretransfusion). Reported black stools at the time.  Last visit with heme was 9/18.  Admission in 09/2013 for CP, NSTEMI (felt due to demand ischemia though Hgb was in 9's; myoview 10/17/13 no reversibility or ischemia) , PNA.  One day admission 7/20 for recurrent chest pain. Ruled out for MI  On Friday and Saturday noticed loose, black stools (per usual GIB).  Has not occurred since then, but no BMs since then.  Went to see Dr [name] yesterday as he felt weak and SOB.  She checked Hgb (11.1) and said anemia was not source.  Admitted yesterday with chest and abdominal "pain".  This is mild tenderness to touch,  not triggered by food, movement, BMs. Located in left abdomen, usually lower to waist location. It has been present for many months.  Chest pain present x 3 months,  Stool is FOBT+. Hgb is 10.4.  Was in 9s in 11/2013 - 12/2013. As low as 8 in 10/2013.   Noncontrast CT abd/pelvis unrevealing but does show vascular disease in heart and extensive atherosclerosis throughout the abdominal and pelvic vasculature, without evidence of aneurysm. larger size of prexistng lesion (presumed cyst) in left kidney  Sugars at home usually in 200s to 300s.  Occasional pedal edema he succesfully treats with extra doses of diuretics.  Feels weak and staggering gait at times, no falls. No blood in urine, no nose bleeds, no large hematomas.   No syncope, no headaches, no NSAIDs. No ETOH.   No sores or rash, no pruritus.   + HOH, no hearing aids used.     PREVIOUS ENDOSCOPIES:  EGD 02/2013 [name] For anemia and FOBT+ dark stools.  1. multiple gastric nonbleeding polyps  2. 2 slightly friable polyps-cauterized with argon plasma  coagulator ource for GI blood loss not certain  Colonoscopy 02/2013  Dr [name] ENDOSCOPIC IMPRESSION:  1. large cecal AVM-status post cauterization  2. colonic polyposis  3. diverticulosis  4. internal hemorrhoids  Melena very likely was due to bleeding from the cecal AVM  EGD October 2013 for iron deficiency anemia. Findings: bleeding angioectasia in fundus / GAVE, s/p APC.   EGD / flex sig December 2012 for GI bleed. Findings:  AVM in the antrum. Single 2 mm nonbleeding AVM. AVM was  obliterated utilizing the argon plasma coagulator (see image7 and  image8). There were multiple polyps identified (see image5).  There were multiple 1-2 mm sessile fundic appearing polyps in the  gastric fundus Esophagitis was found at the gastroesophageal  junction. Esophagitis with erosions was present at the GE junction  (see image11). Otherwise the examination was normal (see image1,  image2,  image3, image6, and image12). Retroflexed views  revealed no abnormalities. The scope was then withdrawn from  the patient and the procedure completed.  ENDOSCOPIC IMPRESSION:  1) AVM in the antrum  2) Polyps, multiple  3) Esophagitis at the gastroesophageal junction  4) Otherwise normal examination  The patient likely has AVMs tract causing intermittent bleeding.  Flex sig-negative.   Colonoscopy / EGD 2009.  Colon FINDINGS: Blood was found throughout the colon. There was fresh red blood and small clots throughout colon. Seemed most prominant in right colon. There were several right sided colonic diverticulum. No obvious source of the bleeding was identified. I attempted to get into the terminal ileum but was not successful (see image002, image003, image004, and image005). The examination was otherwise normal. Retroflexed views in the rectum revealed no abnormalities. The scope was then withdrawn from the patient and the procedure completed.  ENDOSCOPIC IMPRESSION:  1) Blood throughout the colon, right sided diverticulum  2) Otherwise normal examination  Fresh red blood and right sided colonic diverticulum. This is probably a diverticular bleed, could not localize exact source however. Will proceed with EGD now to make sure this is not an UBI bleed before recommending further tests.  RECOMMENDATIONS:  EGD  ENDOSCOPIC IMPRESSION:  1) Hiatal hernia  No UGI bleeding to account for his rectal bleeding. Plavix and ASA likely contribute to his bleeding but he's had very recent coronary stenting   EGD / colonoscopy ([name], Outpatient) 2007 for abdominal pain.  EGD  - Normal: Proximal Esophagus to Distal Esophagus. Comments: No  inflammation or Barrett's.  HIATAL HERNIA:  - Normal: Cardia to Duodenal 2nd Portion. Comments: few tiny polyps  noted.  Colonoscopy for diarrhea.  NORMAL EXAM: Cecum to Rectum.  MULTIPLE POLYPS: Cecum to Transverse Colon. minimum size 1 mm,  maximum size 7 mm.  Procedure: snare without cautery, removed, Polyp  retrieved, 4 polyps Polyps sent to pathology. ICD9: Colon Polyps:  211.3. Comments: 5 seen and removed (62mm cecal polyp not retrieved).  3 removed on way to cecum.  - DIVERTICULOSIS: Descending Colon to Sigmoid Colon. ICD9:  Diverticulosis, Colon: 562.10.    Past Medical History  Diagnosis Date  . Adenomatous colon polyp   . Diverticulosis   . GERD (gastroesophageal reflux disease)   . Hiatal hernia   . Gout     "hands; backbone; elbows; shoulders"  . Asthma with bronchitis   . COPD (chronic obstructive pulmonary disease)   . Chronic hypoxemic respiratory failure   . CHF (congestive heart failure)     grade 2 diastolic dysfunction, EF 37-10% on ECHO 12/2011  . Anemia     transfusion requiring  . GI bleed     GI [name],   . Hypertension   . Peripheral vascular disease     PAD  . Dysrhythmia     "  skips"  . Anginal pain   . Myocardial infarction     "they said I had 2 light ones"  . Shortness of breath 12/31/2011    on nocturnal 3L just at night" (10/16/2013)  . Type II diabetes mellitus     with complications:  retinopathy, nephrophathy.   . Arthritis     "plenty"  . Chronic lower back pain   . Renal insufficiency     "went on dialysis probably 3 times" Last 04/2011  . Skin cancer     "forehead; arms"  . Depression   . Colon polyps 2007, 2014    adenomatous and hyperplastic  . GERD (gastroesophageal reflux disease)     hiatal hernia.   . Memory deficits 01/11/2013  . Obesity   . Carotid bruit     bilateral  . CKD (chronic kidney disease) 03/07/2013  . Hemorrhoids   . GI AVM (gastrointestinal arteriovenous vascular malformation) 02/2013  . Gastric polyp 2012    colonic, gastric.   . Atrial fibrillation   . High cholesterol   . Pneumonia     "have had it several times"  . Chronic bronchitis   . OSA on CPAP   . Alzheimer disease     Past Surgical History  Procedure Laterality Date  . Hand surgery Left  1990s    "chain saw accident; had to reattach fingers on left hand"  . Esophagogastroduodenoscopy  05/05/2011    Procedure: ESOPHAGOGASTRODUODENOSCOPY (EGD);  Surgeon: Inda Castle, MD;  Location: [facility];  Service: Endoscopy;  Laterality: N/A;  . Flexible sigmoidoscopy  05/05/2011    Procedure: FLEXIBLE SIGMOIDOSCOPY;  Surgeon: Inda Castle, MD;  Location: [facility];  Service: Endoscopy;  Laterality: N/A;  . Laparoscopic cholecystectomy  ~ 2001  . Excisional hemorrhoidectomy  1950's  . Cataract extraction w/ intraocular lens  implant, bilateral Bilateral   . Coronary artery bypass graft  1997    CABG X2 vessels  . Iliac artery stent  11/2011    left/H&P  . Renal artery stent  2010    left/H&P  . Iliac artery stent  11/2011    right  . Coronary angioplasty with stent placement  12/31/2011    "I have 15 stents; heart, kidney, aorta, legs"  . Colonoscopy w/ biopsies and polypectomy      "have had probably 5 cut out" (12/31/2011)  . Enteroscopy  03/04/2012    Procedure: ENTEROSCOPY;  Surgeon: Inda Castle, MD;  Location: [facility];  Service: Endoscopy;  Laterality: N/A;  jessica/leone  . Esophagogastroduodenoscopy N/A 03/04/2013    Procedure: ESOPHAGOGASTRODUODENOSCOPY (EGD);  Surgeon: Inda Castle, MD;  Location: [facility];  Service: Endoscopy;  Laterality: N/A;  . Colonoscopy N/A 03/05/2013    Procedure: COLONOSCOPY;  Surgeon: Inda Castle, MD;  Location: [facility];  Service: Endoscopy;  Laterality: N/A;    Prior to Admission medications   Medication Sig Start Date End Date Taking? Authorizing Provider  acetaminophen (TYLENOL) 650 MG CR tablet Take 1,300 mg by mouth 2 (two) times daily.   Yes Historical Provider, MD  albuterol (PROAIR HFA) 108 (90 BASE) MCG/ACT inhaler Inhale 2 puffs into the lungs 2 (two) times daily as needed for wheezing or shortness of breath.    Yes Historical Provider, MD  albuterol (PROVENTIL) (2.5 MG/3ML) 0.083% nebulizer solution  Take 2.5 mg by nebulization 2 (two) times daily.    Yes Historical Provider, MD  allopurinol (ZYLOPRIM) 300 MG tablet Take 300 mg by mouth 2 (two) times  daily.   Yes Historical Provider, MD  amLODipine (NORVASC) 10 MG tablet Take 10 mg by mouth daily.   Yes Historical Provider, MD  aspirin EC 325 MG tablet Take 1 tablet (325 mg total) by mouth daily. 03/19/13  Yes Marianne L York, PA-C  atorvastatin (LIPITOR) 20 MG tablet Take 20 mg by mouth at bedtime.    Yes Historical Provider, MD  cloNIDine HCl (KAPVAY) 0.1 MG TB12 ER tablet Take 1 tablet (0.1 mg total) by mouth 2 (two) times daily. 12/11/11  Yes Tarri Fuller, PA-C  colchicine 0.6 MG tablet Take 0.6 mg by mouth 2 (two) times daily.   Yes Historical Provider, MD  escitalopram (LEXAPRO) 20 MG tablet Take 20 mg by mouth daily.   Yes Historical Provider, MD  ezetimibe (ZETIA) 10 MG tablet Take 10 mg by mouth daily.    Yes Historical Provider, MD  folic acid (FOLVITE) 500 MCG tablet Take 1,200 mcg by mouth daily.    Yes Historical Provider, MD  furosemide (LASIX) 80 MG tablet Take 1 tablet (80 mg total) by mouth 2 (two) times daily. 06/29/12  Yes Carole Civil, MD  gabapentin (NEURONTIN) 100 MG tablet Take 100 mg by mouth at bedtime.    Yes Historical Provider, MD  guaifenesin (ROBITUSSIN) 100 MG/5ML syrup Take 200 mg by mouth 3 (three) times daily as needed for cough.   Yes Historical Provider, MD  insulin aspart (NOVOLOG FLEXPEN) 100 UNIT/ML FlexPen Inject 31-32 Units into the skin 3 (three) times daily with meals. Based on sugar levels   Yes Historical Provider, MD  Insulin Glargine (LANTUS SOLOSTAR) 100 UNIT/ML Solostar Pen Inject 68 Units into the skin 2 (two) times daily.   Yes Historical Provider, MD  isosorbide mononitrate (IMDUR) 60 MG 24 hr tablet Take 60 mg by mouth daily.   Yes Historical Provider, MD  metolazone (ZAROXOLYN) 2.5 MG tablet Take 2.5 mg by mouth See admin instructions. Take 30 minutes prior to morning furosemide dose for a  weight gain of 2-3 lbs or more as needed   Yes Historical Provider, MD  Multiple Vitamin (MULTIVITAMIN WITH MINERALS) TABS Take 0.5 tablets by mouth 2 (two) times daily.   Yes Historical Provider, MD  nitroGLYCERIN (NITROSTAT) 0.4 MG SL tablet Place 0.4 mg under the tongue every 5 (five) minutes as needed for chest pain. May repeat up to 3 times   Yes Historical Provider, MD  nystatin (MYCOSTATIN) 100000 UNIT/ML suspension Take by mouth daily as needed (thrush from breathing treatment).  12/19/13  Yes Historical Provider, MD  pantoprazole (PROTONIX) 40 MG tablet Take 40 mg by mouth daily.    Yes Historical Provider, MD  predniSONE (DELTASONE) 5 MG tablet Take 5 mg by mouth daily.  11/07/12  Yes Historical Provider, MD  tiotropium (SPIRIVA) 18 MCG inhalation capsule Place 18 mcg into inhaler and inhale at bedtime.   Yes Historical Provider, MD  B-D UF III MINI PEN NEEDLES 31G X 5 MM MISC 1 each by Other route See admin instructions. Use with insulin pens daily. 03/14/13   Historical Provider, MD  ONE TOUCH ULTRA TEST test strip 1 each by Other route See admin instructions. Check blood sugar 4 times daily. 03/22/13   Historical Provider, MD    Scheduled Meds: . albuterol  2.5 mg Nebulization BID  . allopurinol  300 mg Oral BID  . amLODipine  10 mg Oral Daily  . aspirin EC  325 mg Oral Daily  . atorvastatin  20 mg Oral QHS  .  cloNIDine HCl  0.1 mg Oral BID  . colchicine  0.6 mg Oral BID  . escitalopram  20 mg Oral Daily  . ezetimibe  10 mg Oral Daily  . folic acid  6,387 mcg Oral Daily  . furosemide  80 mg Oral BID  . gabapentin  100 mg Oral QHS  . insulin aspart  0-15 Units Subcutaneous TID WC  . insulin glargine  60 Units Subcutaneous BID  . isosorbide mononitrate  60 mg Oral Daily  . multivitamin with minerals  1 tablet Oral Daily  . pantoprazole  40 mg Oral Daily  . predniSONE  5 mg Oral Q breakfast  . sodium chloride  3 mL Intravenous Q12H  . sodium chloride  3 mL Intravenous Q12H  .  tiotropium  18 mcg Inhalation QHS   Infusions:   PRN Meds: sodium chloride, acetaminophen, albuterol, guaifenesin, nitroGLYCERIN, nystatin, sodium chloride   Allergies as of 02/12/2014 - Review Complete 02/12/2014  Allergen Reaction Noted  . Donepezil Diarrhea 05/02/2011  . Heparin Rash and Other (See Comments)   . Hydralazine Other (See Comments) 05/02/2011  . Hydromorphone Itching 05/02/2011  . Morphine Itching   . Plavix [clopidogrel bisulfate] Other (See Comments) 05/02/2011  . Promethazine Other (See Comments) 05/02/2011  . Sulfonamide derivatives Itching 05/22/2008  . Cefuroxime Other (See Comments)   . Metoprolol Other (See Comments)   . Ertapenem Itching and Rash 05/02/2011    Family History  Problem Relation Age of Onset  . Lung cancer Brother   . Diabetes Mother   . Arthritis Mother   . Heart attack Mother   . Heart disease Sister   . Stomach cancer Maternal Grandfather   . Heart disease Father   . Heart attack Father   . Heart disease Brother   . ALS Sister   . ALS      nephew/neice  . Colon cancer Neg Hx     History   Social History  . Marital Status: Married    Spouse Name: N/A    Number of Children: 5  . Years of Education: 8 th   Occupational History  . 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 Smoking status: Former Smoker -- 2.50 packs/day for 45 years    Types: Cigarettes    Quit date: 05/25/1990  . Smokeless tobacco: Former Systems developer    Types: Chew    Quit date: 06/01/1990  . Alcohol Use: No     Comment: 12/31/2011 "used to drink; last alcohol has been 30 years I imagine"  . Drug Use: No  . Sexual Activity: No   Other Topics Concern  . Not on file   Social History Narrative   Daily caffeine use.  Lives in a house with his wife.  Uses a cane to ambulate and he has a walker, but he does not use it.  He does not cook or pay bills.  He does not drive.      REVIEW OF SYSTEMS: Per HPI.  Completed 12 system review.     PHYSICAL EXAM: Vital signs in last 24 hours: Filed Vitals:   02/13/14 0700  BP: 146/55  Pulse: 57  Temp: 98.1 F (36.7 C)  Resp: 18   Wt Readings from Last 3 Encounters:  02/12/14 106.1 kg (233 lb 14.5 oz)  02/09/14 110.043 kg (242 lb 9.6 oz)  12/12/13 104.191 kg (229 lb 11.2 oz)    General: obese, comfortable, pleasant Head:  No asymmetry  or trauma  Eyes:  No icterus or pallor Ears:  HOH  Nose:  No discharge Mouth:  Clear and moist Neck:  No mass nor JVD Lungs:  Clear bil.  Diminished BS Heart: RRR.  No MRG Abdomen:  Soft, obese, NT, ND.  No mass or HSM.  No bruits.   Rectal: brown/dark stool is FOBT+   Musc/Skeltl: no joint redness or swelling Extremities:  No CCE  Neurologic:  oriented x 3.  No limb weakness.  No tremor.  Skin:  No telangectasia or rash Tattoos:  On arms Nodes:  No cervical adenopathy   Psych:  Pleasant, relaxed, appropriate.   Intake/Output from previous day: 09/21 0701 - 09/22 0700 In: 0  Out: 625 [Urine:625] Intake/Output this shift:    LAB RESULTS:  Recent Labs  02/12/14 1556 02/12/14 2225 02/13/14 0737  WBC 10.3 8.2 8.5  HGB 10.9* 10.0* 10.4*  HCT 33.2* 30.2* 31.0*  PLT 232 201 192   BMET Lab Results  Component Value Date   NA 140 02/13/2014   NA 135* 02/12/2014   NA 139 12/11/2013   K 3.4* 02/13/2014   K 4.1 02/12/2014   K 4.0 12/11/2013   CL 95* 02/13/2014   CL 90* 02/12/2014   CL 95* 12/11/2013   CO2 32 02/13/2014   CO2 27 02/12/2014   CO2 30 12/11/2013   GLUCOSE 105* 02/13/2014   GLUCOSE 279* 02/12/2014   GLUCOSE 320* 12/11/2013   BUN 55* 02/13/2014   BUN 57* 02/12/2014   BUN 64* 12/11/2013   CREATININE 2.15* 02/13/2014   CREATININE 2.37* 02/12/2014   CREATININE 2.13* 12/11/2013   CALCIUM 9.5 02/13/2014   CALCIUM 9.9 02/12/2014   CALCIUM 9.4 12/11/2013   LFT  Recent Labs  02/13/14 0737  PROT 6.6  ALBUMIN 3.5  AST 44*  ALT 24  ALKPHOS 69  BILITOT 0.5   PT/INR Lab Results  Component Value Date   INR 1.12  02/12/2014   INR 1.12 10/16/2013   INR 0.98 09/20/2012   Lipase     Component Value Date/Time   LIPASE 42 02/12/2014 2225    RADIOLOGY STUDIES: Ct Abdomen Pelvis Wo Contrast 02/12/2014  .  COMPARISON:  CT of the abdomen 08/07/2008.  FINDINGS: Lung Bases: Patchy areas of ground-glass attenuation and septal thickening in the lung bases bilaterally, suspicious for underlying interstitial lung disease. 4 mm nodule in the right lower lobe (image 5 of series 3). Small hiatal hernia. Cardiomegaly. Extensive mitral annular calcifications. Pericardial calcifications along the anterior and inferior wall of the right ventricle. Atherosclerotic calcifications in the thoracic aorta and right coronary artery.  Abdomen/Pelvis: Status post cholecystectomy. The unenhanced appearance of the liver, pancreas, spleen, bilateral adrenal glands and the right kidney is unremarkable. There is an exophytic 2.1 cm low-attenuation lesion in the lower pole of the left kidney which is incompletely characterized on today's noncontrast CT examination, but appears only slightly larger than remote prior study from 2010, presumably a small cyst. Extensive atherosclerosis throughout the abdominal and pelvic vasculature, without evidence of aneurysm. Bilateral renal artery and common iliac artery stents are noted. No significant volume of ascites. No pneumoperitoneum. No pathologic distention of small bowel. No lymphadenopathy noted in the abdomen or pelvis on today's non contrast CT examination. Prostate gland and urinary bladder are unremarkable in appearance.  Musculoskeletal: There are no aggressive appearing lytic or blastic lesions noted in the visualized portions of the skeleton.  IMPRESSION: 1. No acute findings in the abdomen or pelvis to account  for the patient's symptoms. 2. Status post cholecystectomy. 3. Extensive atherosclerosis, including right coronary artery disease. 4. Extensive pericardial calcifications again noted overlying  the right ventricle. No gross morphologic changes in the heart to strongly suggest the presence of constrictive physiology at this time. 5. The appearance of the lung bases is suggestive of underlying interstitial lung disease, however, this is incompletely evaluated on today's examination. A followup nonemergent high-resolution chest CT should be considered in the near future for more definitive evaluation if clinically appropriate. 6. Cardiomegaly.   Electronically Signed   By: Vinnie Langton M.D.   On: 02/12/2014 19:39   Dg Chest 2 View 02/12/2014   CLINICAL DATA:  Chest pain, shortness of breath, history of open heart surgery  EXAM: CHEST  2 VIEW  COMPARISON:  12/11/2013  FINDINGS: Cardiomegaly with possible mild interstitial edema, although the appearance may also reflect chronic compensated heart failure. No pleural effusion or pneumothorax.  Postsurgical changes related to prior CABG.  IMPRESSION: Cardiomegaly with possible mild interstitial edema, equivocal.   Electronically Signed   By: Julian Hy M.D.   On: 02/12/2014 18:27    IMPRESSION:   *  Chronic anemia associated with GI blood loss Current Hgb/hct improved.  Got parenteral iron x 3 over previous 3 months.  Last PRBC was 12/08/13.  Dark stools a few days ago, have ceased for now.    *  Chest pain, chronic.  Non STEMI in 09/2013, ruled out for MI in 11/2013. Troponins are negative x 4  *  Abdominal "pain".  This is mild tenderness to touch, not triggered by food, movement, BMs.  Vascular disease on Ct scan  *  Polypharmacy.  On 22 regular meds plus prn meds  *  Peripheral vascular disease  *  IDDM. Last Epic hgb A1c was 08/2012: 9.9.  Sugars not well controlled for the most part judging by inpt serum glucose.    PLAN:     *  Given stable Hgb, ok to discharge home.  Dr Ardis Hughs has no plans to rescope pt.  Should have follow up hgbs in 7 to 10 days.  Has ROV with Dr [name] for 11/23, and from looking at appt schedule looks  like hemay have repeat iron infusion then.  *  Start carb mod diet.     Azucena Freed  02/13/2014, 12:02 PM Pager: [phone]    ________________________________________________________________________  Velora Heckler GI MD note:  I personally examined the patient, reviewed the data and agree with the assessment and plan described above.  HGB higher this admit than in recent past.  NO need for GI procedures, testing.  He should continue iron infusions with Dr. [name].     Owens Loffler, MD [facility] Pager [phone]

## 2014-02-13 NOTE — Care Management Note (Unsigned)
    Page 1 of 1   02/13/2014     2:13:29 PM CARE MANAGEMENT NOTE 02/13/2014  Patient:  [name], [name]   Account Number:  [id number]  Date Initiated:  02/13/2014  Documentation initiated by:  Donnika Kucher  Subjective/Objective Assessment:   Pt adm on 02/12/14 with CP, heme positive stools.  PTA, pt lives with spouse and is independent.     Action/Plan:   Will follow for dc needs as pt progresses.   Anticipated DC Date:  02/15/2014   Anticipated DC Plan:  [place]  CM consult      Choice offered to / List presented to:             Status of service:  In process, will continue to follow Medicare Important Message given?   (If response is "NO", the following Medicare IM given date fields will be blank) Date Medicare IM given:   Medicare IM given by:   Date Additional Medicare IM given:   Additional Medicare IM given by:    Discharge Disposition:    Per UR Regulation:  Reviewed for med. necessity/level of care/duration of stay  If discussed at [place] of Stay Meetings, dates discussed:    Comments:

## 2014-02-14 ENCOUNTER — Other Ambulatory Visit: Payer: Self-pay | Admitting: *Deleted

## 2014-02-14 DIAGNOSIS — R002 Palpitations: Secondary | ICD-10-CM

## 2014-02-15 ENCOUNTER — Encounter (INDEPENDENT_AMBULATORY_CARE_PROVIDER_SITE_OTHER): Payer: BC Managed Care – PPO

## 2014-02-15 ENCOUNTER — Encounter: Payer: Self-pay | Admitting: *Deleted

## 2014-02-15 DIAGNOSIS — R002 Palpitations: Secondary | ICD-10-CM

## 2014-02-15 DIAGNOSIS — I4891 Unspecified atrial fibrillation: Secondary | ICD-10-CM

## 2014-02-15 NOTE — Progress Notes (Signed)
Patient ID: [name], male   DOB: 05/30/1933, 80 y.o.   MRN: [id number] [employer] 48 hour holter monitor applied to patient.

## 2014-02-15 NOTE — Discharge Summary (Signed)
Physician Discharge Summary  [name] YJE:[id number] DOB: 15-Dec-1933 DOA: 02/12/2014  PCP: Thomas Downing, MD  Admit date: 02/12/2014 Discharge date: 02/13/2014  Time spent:  30 minutes  Recommendations for Outpatient Follow-up:  1. Follow up with PCP in one week.  2. Follow up with cardiologyand GI as recommended.   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   Filed Weights   02/12/14 2301  Weight: 106.1 kg (233 lb 14.5 oz)    History of present illness:  HPI: [name] is a 80 y.o. male with past medical history of chronic GI bleeding, hx of AVM, type 2 diabetes, history of A. fib, diastolic congestive heart failure (2-D echo on 11/29/13 showed EF 63-78% with diastolic dysfunction) and CABG, who presents with abdominal pain and chest pain. Gi and cardiology consulted and recommendations given.   Hospital Course:  1. Chest pain: Patient has atypical chest pain, without new EKG change (patient had old T-wave inversion in inferior leads and precordial leads from V4 to V6). Given his significant history of CAD and s/p CABG, it is important to rule out ACS. Other DD include, but less likely, PE (chest is not pleuratic, patient does not have tachycardia, oxygen saturation is 98% at room air, Well's score is low probability), pneumonia (no leukocytosis, fever, and a negative chest x-ray for Infiltration).  Cardiac enzymes negaive. Cardiology consulted and recommended outpatient event monitor placement. 2. Abdominal pain and GIB: etiology is not clear at this moment.  Patient has history of AVM (s/p of cauterization). He has mild tenderness at the left lower quadrant, which may indicate the possibility of left colon abnormalities, such as diverticulosis and AVM. Patient has chronic and mild GI bleeding which may have partially contributed. He has positive FOBT on admission, but with stable hemoglobin level. CT-add/pelvis showed no acute findings. Gi consulted and recommended outpatient follow up and decreasing the dose of aspirin to 81 mg from 325 mg daily. 3. CKD-III: Baseline creatinine 1.6-2.3. His creatinine is 2.37 on admission, which is close to his baseline   but continue Lasix 80 mg bid which is for dCHF  . Currently he is euvolemic.  4. PAF: Heart rate is well controlled. Patient is not on Coumadin at home, possibly because of the chronic GI bleeding   5. HTN: Blood pressure 136/49 on admission.  - Continue home medications  6. DM-II: On Lantus 68 units twice a day at home. Last A1c was 9.9   Resume home medications. 7. chronic bronchitis: stable.  - Continue home breathing treatment regimen   Procedures:  none  Consultations:cardiology gastroenterology  Discharge Exam: Filed Vitals:   02/13/14 1502  BP: 135/47  Pulse: 69  Temp: 98.4 F (36.9 C)  Resp:     General: alert afebrile comfortable Cardiovascular: s1s2 Respiratory: ctab  Discharge Instructions You were cared for by a hospitalist during your hospital stay. If you have any questions about your discharge medications or the care you received while you were in the hospital after you are discharged, you can call the unit and asked to speak with the hospitalist  on call if the hospitalist that took care of you is not available. Once you are discharged, your primary care physician will handle any further medical issues. Please note that NO REFILLS for any discharge medications will be authorized once you are discharged, as it is imperative that you return to your primary care  physician (or establish a relationship with a primary care physician if you do not have one) for your aftercare needs so that they can reassess your need for medications and monitor your lab values.  Discharge Instructions   Diet - low sodium heart healthy    Complete by:  As directed      Discharge instructions    Complete by:  As directed   Follow up with cardiology and Gastroenterology as recommended.          Discharge Medication List as of 02/13/2014  3:40 PM    CONTINUE these medications which have CHANGED   Details  aspirin EC 81 MG tablet Take 1 tablet (81 mg total) by mouth daily., Starting 02/13/2014, Until Discontinued, Print      CONTINUE these medications which have NOT CHANGED   Details  acetaminophen (TYLENOL) 650 MG CR tablet Take 1,300 mg by mouth 2 (two) times daily., Until Discontinued, Historical Med    albuterol (PROAIR HFA) 108 (90 BASE) MCG/ACT inhaler Inhale 2 puffs into the lungs 2 (two) times daily as needed for wheezing or shortness of breath. , Until Discontinued, Historical Med    albuterol (PROVENTIL) (2.5 MG/3ML) 0.083% nebulizer solution Take 2.5 mg by nebulization 2 (two) times daily. , Until Discontinued, Historical Med    allopurinol (ZYLOPRIM) 300 MG tablet Take 300 mg by mouth 2 (two) times daily., Until Discontinued, Historical Med    amLODipine (NORVASC) 10 MG tablet Take 10 mg by mouth daily., Until Discontinued, Historical Med    atorvastatin (LIPITOR) 20 MG tablet Take 20 mg by mouth at bedtime. , Until Discontinued, Historical Med    cloNIDine HCl (KAPVAY) 0.1 MG TB12 ER tablet Take 1 tablet (0.1 mg total) by mouth 2 (two) times daily., Starting 12/11/2011, Until Discontinued, Normal    colchicine 0.6 MG tablet Take 0.6 mg by mouth 2 (two) times daily., Until Discontinued, Historical Med    escitalopram (LEXAPRO) 20 MG tablet Take 20 mg by mouth daily., Until Discontinued, Historical Med    ezetimibe (ZETIA) 10 MG tablet Take 10 mg by  mouth daily. , Until Discontinued, Historical Med    folic acid (FOLVITE) 086 MCG tablet Take 1,200 mcg by mouth daily. , Until Discontinued, Historical Med    furosemide (LASIX) 80 MG tablet Take 1 tablet (80 mg total) by mouth 2 (two) times daily., Starting 06/29/2012, Until Discontinued, Normal    gabapentin (NEURONTIN) 100 MG tablet Take 100 mg by mouth at bedtime. , Until Discontinued, Historical Med    guaifenesin (ROBITUSSIN) 100 MG/5ML syrup Take 200 mg by mouth 3 (three) times daily as needed for cough., Until Discontinued, Historical Med    insulin aspart (NOVOLOG FLEXPEN) 100 UNIT/ML FlexPen Inject 31-32 Units into the skin 3 (three) times daily with meals. Based on sugar levels, Until Discontinued, Historical Med    Insulin Glargine (LANTUS SOLOSTAR) 100 UNIT/ML Solostar Pen Inject 68 Units into the skin 2 (two) times daily., Until Discontinued, Historical Med    isosorbide mononitrate (IMDUR) 60 MG 24 hr tablet Take 60 mg by mouth daily., Until Discontinued, Historical Med    metolazone (ZAROXOLYN) 2.5 MG tablet Take 2.5 mg by mouth  See admin instructions. Take 30 minutes prior to morning furosemide dose for a weight gain of 2-3 lbs or more as needed, Until Discontinued, Historical Med    Multiple Vitamin (MULTIVITAMIN WITH MINERALS) TABS Take 0.5 tablets by mouth 2 (two) times daily., Until Discontinued, Historical Med    nitroGLYCERIN (NITROSTAT) 0.4 MG SL tablet Place 0.4 mg under the tongue every 5 (five) minutes as needed for chest pain. May repeat up to 3 times, Until Discontinued, Historical Med    nystatin (MYCOSTATIN) 100000 UNIT/ML suspension Take by mouth daily as needed (thrush from breathing treatment). , Starting 12/19/2013, Until Discontinued, Historical Med    pantoprazole (PROTONIX) 40 MG tablet Take 40 mg by mouth daily. , Until Discontinued, Historical Med    predniSONE (DELTASONE) 5 MG tablet Take 5 mg by mouth daily. , Starting 11/07/2012, Until Discontinued,  Historical Med    tiotropium (SPIRIVA) 18 MCG inhalation capsule Place 18 mcg into inhaler and inhale at bedtime., Until Discontinued, Historical Med    B-D UF III MINI PEN NEEDLES 31G X 5 MM MISC 1 each by Other route See admin instructions. Use with insulin pens daily., Starting 03/14/2013, Until Discontinued, Historical Med    ONE TOUCH ULTRA TEST test strip 1 each by Other route See admin instructions. Check blood sugar 4 times daily., Starting 03/22/2013, Until Discontinued, Historical Med       Allergies  Allergen Reactions  . Donepezil Diarrhea  . Heparin Rash and Other (See Comments)    Family stated it almost killed him. Hallucinations and itching.  Marland Kitchen Hydralazine Other (See Comments)    Do not ever take per Dr. Melvern Banker, cardiologist  . Hydromorphone Itching  . Morphine Itching  . Plavix [Clopidogrel Bisulfate] Other (See Comments)    GI BLEEDING; "so bad I had to take blood; dr took me off it"  . Promethazine Other (See Comments)    respiratory failure  . Sulfonamide Derivatives Itching  . Cefuroxime Other (See Comments)     unkown reaction  . Metoprolol Other (See Comments)    unknown  . Ertapenem Itching and Rash    12/31/2011 pt does not recall allergy or severity      The results of significant diagnostics from this hospitalization (including imaging, microbiology, ancillary and laboratory) are listed below for reference.    Significant Diagnostic Studies: Ct Abdomen Pelvis Wo Contrast  02/12/2014   CLINICAL DATA:  Left upper quadrant abdominal pain.  EXAM: CT ABDOMEN AND PELVIS WITHOUT CONTRAST  TECHNIQUE: Multidetector CT imaging of the abdomen and pelvis was performed following the standard protocol without IV contrast.  COMPARISON:  CT of the abdomen 08/07/2008.  FINDINGS: Lung Bases: Patchy areas of ground-glass attenuation and septal thickening in the lung bases bilaterally, suspicious for underlying interstitial lung disease. 4 mm nodule in the right lower lobe  (image 5 of series 3). Small hiatal hernia. Cardiomegaly. Extensive mitral annular calcifications. Pericardial calcifications along the anterior and inferior wall of the right ventricle. Atherosclerotic calcifications in the thoracic aorta and right coronary artery.  Abdomen/Pelvis: Status post cholecystectomy. The unenhanced appearance of the liver, pancreas, spleen, bilateral adrenal glands and the right kidney is unremarkable. There is an exophytic 2.1 cm low-attenuation lesion in the lower pole of the left kidney which is incompletely characterized on today's noncontrast CT examination, but appears only slightly larger than remote prior study from 2010, presumably a small cyst. Extensive atherosclerosis throughout the abdominal and pelvic vasculature, without evidence of aneurysm. Bilateral renal artery and common iliac  artery stents are noted. No significant volume of ascites. No pneumoperitoneum. No pathologic distention of small bowel. No lymphadenopathy noted in the abdomen or pelvis on today's non contrast CT examination. Prostate gland and urinary bladder are unremarkable in appearance.  Musculoskeletal: There are no aggressive appearing lytic or blastic lesions noted in the visualized portions of the skeleton.  IMPRESSION: 1. No acute findings in the abdomen or pelvis to account for the patient's symptoms. 2. Status post cholecystectomy. 3. Extensive atherosclerosis, including right coronary artery disease. 4. Extensive pericardial calcifications again noted overlying the right ventricle. No gross morphologic changes in the heart to strongly suggest the presence of constrictive physiology at this time. 5. The appearance of the lung bases is suggestive of underlying interstitial lung disease, however, this is incompletely evaluated on today's examination. A followup nonemergent high-resolution chest CT should be considered in the near future for more definitive evaluation if clinically appropriate. 6.  Cardiomegaly.   Electronically Signed   By: Vinnie Langton M.D.   On: 02/12/2014 19:39   Dg Chest 2 View  02/12/2014   CLINICAL DATA:  Chest pain, shortness of breath, history of open heart surgery  EXAM: CHEST  2 VIEW  COMPARISON:  12/11/2013  FINDINGS: Cardiomegaly with possible mild interstitial edema, although the appearance may also reflect chronic compensated heart failure. No pleural effusion or pneumothorax.  Postsurgical changes related to prior CABG.  IMPRESSION: Cardiomegaly with possible mild interstitial edema, equivocal.   Electronically Signed   By: Julian Hy M.D.   On: 02/12/2014 18:27    Microbiology: Recent Results (from the past 240 hour(s))  MRSA PCR SCREENING     Status: Abnormal   Collection Time    02/12/14 11:59 PM      Result Value Ref Range Status   MRSA by PCR POSITIVE (*) NEGATIVE Final   Comment:            The GeneXpert MRSA Assay (FDA     approved for NASAL specimens     only), is one component of a     comprehensive MRSA colonization     surveillance program. It is not     intended to diagnose MRSA     infection nor to guide or     monitor treatment for     MRSA infections.     RESULT CALLED TO, READ BACK BY AND VERIFIED WITH:     SACKLETT,M RN [id number] 02/13/14 MITCHELL,L     Labs: Basic Metabolic Panel:  Recent Labs Lab 02/12/14 1556 02/13/14 0737  NA 135* 140  K 4.1 3.4*  CL 90* 95*  CO2 27 32  GLUCOSE 279* 105*  BUN 57* 55*  CREATININE 2.37* 2.15*  CALCIUM 9.9 9.5   Liver Function Tests:  Recent Labs Lab 02/13/14 0737  AST 44*  ALT 24  ALKPHOS 69  BILITOT 0.5  PROT 6.6  ALBUMIN 3.5    Recent Labs Lab 02/12/14 2225  LIPASE 42   No results found for this basename: AMMONIA,  in the last 168 hours CBC:  Recent Labs Lab 02/09/14 0815 02/12/14 1103 02/12/14 1556 02/12/14 2225 02/13/14 0737  WBC 9.7 11.0* 10.3 8.2 8.5  NEUTROABS 6.5 7.1*  --   --   --   HGB 10.4* 11.1* 10.9* 10.0* 10.4*  HCT 32.6* 33.6* 33.2*  30.2* 31.0*  MCV 96.2 94.4 94.3 93.8 93.4  PLT 199 213 232 201 192   Cardiac Enzymes:  Recent Labs Lab 02/12/14 2225 02/13/14 0426  TROPONINI <0.30 <0.30   BNP: BNP (last 3 results)  Recent Labs  10/14/13 1625 12/11/13 1235 02/12/14 1556  PROBNP 895.7* 1685.0* 631.8*   CBG:  Recent Labs Lab 02/12/14 2212 02/13/14 0658 02/13/14 1124  GLUCAP 210* 114* 120*       Signed:  Bette Brienza  [facility] 02/15/2014, 10:48 PM

## 2014-02-15 NOTE — ED Provider Notes (Signed)
Medical screening examination/treatment/procedure(s) were conducted as a shared visit with non-physician practitioner(s) and myself.  I personally evaluated the patient during the encounter.   EKG Interpretation   Date/Time:  Monday February 12 2014 15:41:16 EDT Ventricular Rate:  85 PR Interval:  156 QRS Duration: 92 QT Interval:  396 QTC Calculation: 471 R Axis:   50 Text Interpretation:  Sinus rhythm with frequent Premature ventricular  complexes and Premature atrial complexes ST \\T \ T wave abnormality,  consider inferolateral ischemia Prolonged QT Abnormal ECG No change vs  11-2013 Confirmed by Jeneen Rinks  MD, [place] ([id number]) on 02/12/2014 5:26:14 PM      Please see my additional dictation.  Tanna Furry, MD 02/15/14 [phone]

## 2014-02-23 ENCOUNTER — Telehealth: Payer: Self-pay | Admitting: Cardiovascular Disease

## 2014-02-23 NOTE — Telephone Encounter (Signed)
New message     Want monitor results 

## 2014-02-23 NOTE — Telephone Encounter (Signed)
Informed patient's wife that holter monitor results are still pending and we will let her know when the results are final.  She expressed gratitude for call back and said she looks forward to speaking with [place] soon.

## 2014-03-05 ENCOUNTER — Telehealth: Payer: Self-pay | Admitting: Cardiovascular Disease

## 2014-03-05 NOTE — Telephone Encounter (Signed)
°  Patients wife is calling wanting to know if patient can get a medication Zofran for nausea and something for constipation? Please call and advise. Wife states he's in the final stages of congestive heart failure.

## 2014-03-05 NOTE — Telephone Encounter (Signed)
I spoke with the pt's wife and made her aware that cardiology cannot prescribe this medication. In reviewing the pt's discharge summary the pt should have followed up with GI but has not arranged any follow-up.  I instructed Mrs Labreck to contact [facility] to arrange an appointment or contact the PCP to see if they can see the pt for nausea and constipation.

## 2014-03-16 ENCOUNTER — Ambulatory Visit (INDEPENDENT_AMBULATORY_CARE_PROVIDER_SITE_OTHER): Payer: BC Managed Care – PPO | Admitting: Cardiovascular Disease

## 2014-03-16 ENCOUNTER — Encounter: Payer: Self-pay | Admitting: Cardiovascular Disease

## 2014-03-16 VITALS — BP 118/36 | HR 67 | Ht 66.0 in | Wt 231.0 lb

## 2014-03-16 DIAGNOSIS — I5033 Acute on chronic diastolic (congestive) heart failure: Secondary | ICD-10-CM

## 2014-03-16 DIAGNOSIS — I2510 Atherosclerotic heart disease of native coronary artery without angina pectoris: Secondary | ICD-10-CM

## 2014-03-16 MED ORDER — AMLODIPINE BESYLATE 5 MG PO TABS: 5.0000 mg | ORAL_TABLET | Freq: Every day | ORAL | Status: AC

## 2014-03-16 NOTE — Progress Notes (Signed)
Background:  The patient is followed for coronary artery disease status post remote CABG, chronic diastolic heart failure, stage IV chronic kidney disease, and peripheral arterial disease. He has morbid obesity, dietary noncompliance, COPD, and history of recurrent GI bleeding. He's been diagnosed with AVMs. In general he has very poor health.  HPI:  80 year old gentleman presenting for followup evaluation. He was last hospitalized in September with atypical chest pain. His troponins were negative. He was discharged home without any stress testing.  The patient continues to have a lot of problems. His shortness of breath has progressed. He is now under the care of hospice. He just had her initial visit and some medication adjustments have been made. His blood pressure was noted to be low. He's had no chest pain. He describes wheezing and orthopnea. No significant problems with leg swelling. His gait is unsteady and he had a recent fall.  Studies:  Myoview scan 10/17/2013: IMPRESSION:  No evidence for pharmacological induced reversibility or ischemia.  Calculated ejection fraction is 46%. There appears to be mild  dyskinesia.  Electronically Signed  By: Markus Daft M.D.  On: 10/17/2013 15:03  2-D echocardiogram 11/29/2013: Study Conclusions  - Left ventricle: The cavity size was normal. There was mild focal basal hypertrophy of the septum. Systolic function was normal. The estimated ejection fraction was in the range of 55% to 60%. Doppler parameters are consistent with both elevated ventricular end-diastolic filling pressure and elevated left atrial filling pressure. - Aortic valve: There was trivial regurgitation. - Mitral valve: Calcified annulus. - Left atrium: The atrium was moderately dilated. - Atrial septum: There was increased thickness of the septum, consistent with lipomatous hypertrophy. No defect or patent foramen ovale was identified. - Pulmonary arteries: PA peak  pressure: 42 mm Hg (S).   Outpatient Encounter Prescriptions as of 03/16/2014  Medication Sig  . acetaminophen (TYLENOL) 650 MG CR tablet Take 1,300 mg by mouth 2 (two) times daily.  Marland Kitchen albuterol (PROAIR HFA) 108 (90 BASE) MCG/ACT inhaler Inhale 2 puffs into the lungs 2 (two) times daily as needed for wheezing or shortness of breath.   Marland Kitchen albuterol (PROVENTIL) (2.5 MG/3ML) 0.083% nebulizer solution Take 2.5 mg by nebulization 2 (two) times daily.   Marland Kitchen allopurinol (ZYLOPRIM) 300 MG tablet Take 300 mg by mouth 2 (two) times daily.  Marland Kitchen amLODipine (NORVASC) 10 MG tablet Take 10 mg by mouth daily.  Marland Kitchen aspirin EC 81 MG tablet Take 1 tablet (81 mg total) by mouth daily.  Marland Kitchen atorvastatin (LIPITOR) 20 MG tablet Take 20 mg by mouth at bedtime.   . cloNIDine HCl (KAPVAY) 0.1 MG TB12 ER tablet Take 1 tablet (0.1 mg total) by mouth 2 (two) times daily.  Marland Kitchen escitalopram (LEXAPRO) 20 MG tablet Take 20 mg by mouth daily.  Marland Kitchen ezetimibe (ZETIA) 10 MG tablet Take 10 mg by mouth daily.   . folic acid (FOLVITE) 229 MCG tablet Take 1,200 mcg by mouth daily.   . furosemide (LASIX) 80 MG tablet Take 1 tablet (80 mg total) by mouth 2 (two) times daily.  Marland Kitchen guaifenesin (ROBITUSSIN) 100 MG/5ML syrup Take 200 mg by mouth 3 (three) times daily as needed for cough.  . insulin aspart (NOVOLOG FLEXPEN) 100 UNIT/ML FlexPen Inject 31-32 Units into the skin 3 (three) times daily with meals. Based on sugar levels  . Insulin Glargine (LANTUS SOLOSTAR) 100 UNIT/ML Solostar Pen Inject 68 Units into the skin 2 (two) times daily.  . isosorbide mononitrate (IMDUR) 60 MG 24 hr tablet  Take 60 mg by mouth daily.  Marland Kitchen LYRICA 50 MG capsule Take 50 mg by mouth daily.   . metolazone (ZAROXOLYN) 2.5 MG tablet Take 2.5 mg by mouth See admin instructions. Take 30 minutes prior to morning furosemide dose for a weight gain of 2-3 lbs or more as needed  . Multiple Vitamin (MULTIVITAMIN WITH MINERALS) TABS Take 0.5 tablets by mouth 2 (two) times daily.  .  nitroGLYCERIN (NITROSTAT) 0.4 MG SL tablet Place 0.4 mg under the tongue every 5 (five) minutes as needed for chest pain. May repeat up to 3 times  . nystatin (MYCOSTATIN) 100000 UNIT/ML suspension Take by mouth daily as needed (thrush from breathing treatment).   . pantoprazole (PROTONIX) 40 MG tablet Take 40 mg by mouth daily.   . predniSONE (DELTASONE) 5 MG tablet Take 5 mg by mouth every other day.   . tiotropium (SPIRIVA) 18 MCG inhalation capsule Place 18 mcg into inhaler and inhale at bedtime.  . [DISCONTINUED] B-D UF III MINI PEN NEEDLES 31G X 5 MM MISC 1 each by Other route See admin instructions. Use with insulin pens daily.  . [DISCONTINUED] colchicine 0.6 MG tablet Take 0.6 mg by mouth 2 (two) times daily.  . [DISCONTINUED] gabapentin (NEURONTIN) 100 MG tablet Take 100 mg by mouth at bedtime.   . [DISCONTINUED] ONE TOUCH ULTRA TEST test strip 1 each by Other route See admin instructions. Check blood sugar 4 times daily.    Allergies  Allergen Reactions  . Donepezil Diarrhea  . Heparin Rash and Other (See Comments)    Family stated it almost killed him. Hallucinations and itching.  Marland Kitchen Hydralazine Other (See Comments)    Do not ever take per Dr. Melvern Banker, cardiologist  . Hydromorphone Itching  . Morphine Itching  . Plavix [Clopidogrel Bisulfate] Other (See Comments)    GI BLEEDING; "so bad I had to take blood; dr took me off it"  . Promethazine Other (See Comments)    respiratory failure  . Sulfonamide Derivatives Itching  . Cefuroxime Other (See Comments)     unkown reaction  . Metoprolol Other (See Comments)    unknown  . Ertapenem Itching and Rash    12/31/2011 pt does not recall allergy or severity    Past Medical History  Diagnosis Date  . Adenomatous colon polyp   . Diverticulosis   . GERD (gastroesophageal reflux disease)   . Hiatal hernia   . Gout     "hands; backbone; elbows; shoulders"  . Asthma with bronchitis   . COPD (chronic obstructive pulmonary disease)    . Chronic hypoxemic respiratory failure   . CHF (congestive heart failure)     grade 2 diastolic dysfunction, EF 16-10% on ECHO 12/2011  . Anemia     transfusion requiring  . GI bleed     GI [name],   . Hypertension   . Peripheral vascular disease     PAD  . Dysrhythmia     "skips"  . Anginal pain   . Myocardial infarction     "they said I had 2 light ones"  . Shortness of breath 12/31/2011    on nocturnal 3L just at night" (10/16/2013)  . Type II diabetes mellitus     with complications:  retinopathy, nephrophathy.   . Arthritis     "plenty"  . Chronic lower back pain   . Renal insufficiency     "went on dialysis probably 3 times" Last 04/2011  . Skin cancer     "  forehead; arms"  . Depression   . Colon polyps 2007, 2014    adenomatous and hyperplastic  . GERD (gastroesophageal reflux disease)     hiatal hernia.   . Memory deficits 01/11/2013  . Obesity   . Carotid bruit     bilateral  . CKD (chronic kidney disease) 03/07/2013  . Hemorrhoids   . GI AVM (gastrointestinal arteriovenous vascular malformation) 02/2013  . Gastric polyp 2012    colonic, gastric.   . Atrial fibrillation   . High cholesterol   . Pneumonia     "have had it several times"  . Chronic bronchitis   . OSA on CPAP   . Alzheimer disease     family history includes ALS in his sister and another family member; Arthritis in his mother; Diabetes in his mother; Heart attack in his father and mother; Heart disease in his brother, father, and sister; Lung cancer in his brother; Stomach cancer in his maternal grandfather. There is no history of Colon cancer.   ROS: Positive for weakness, gait instability, shortness of breath, wheezing, or constipation, low back pain, otherwise negative except as per HPI  BP 118/36  Pulse 67  Ht 5\' 6"  (1.676 m)  Wt 231 lb (104.781 kg)  BMI 37.30 kg/m2  SpO2 91%  PHYSICAL EXAM: Pt is alert and oriented, obese chronically ill-appearing male in NAD HEENT:  normal Neck: JVP - normal Lungs: Scattered rhonchi bilaterally CV: RRR without murmur or gallop Abd: soft, NT, obese Ext: Trace pretibial edema bilaterally, distal pulses intact and equal Skin: warm/dry no rash  ASSESSMENT AND PLAN: 1. Coronary artery disease, native vessel, without symptoms of angina. 2. Chronic diastolic heart failure with progressive dyspnea 3. Morbid obesity 4. Stage IV chronic kidney disease 5. Essential hypertension 6. Type 2 diabetes  The patient's clinical condition continues to worsen. He is now under the care of hospice. We had a frank discussion today about his poor overall prognosis. We reviewed his medications and they wish to simplify his medical regimen to reflect a more palliative approach to his care. I am going to stop his atorvastatin and zetia. As his blood pressure has been running low, will reduce amlodipine 5 mg daily. I think his diuretics should be continued to help prevent further fluid build up and worsening of his heart failure. He clearly has decided to be DO NOT RESUSCITATE in case of any further hospitalizations. I am going to schedule him for a 3 month followup visit if he feels well enough to return.  Sherren Mocha MD 03/16/2014 9:34 AM

## 2014-03-16 NOTE — Patient Instructions (Signed)
Your physician has recommended you make the following change in your medication:  1. STOP Atorvastatin 2. STOP Zetia 3. STOP Multi-vitamin 4. DECREASE Amlodipine to 5mg  take one by mouth daily  Your physician wants you to follow-up in: 3 MONTHS with Dr Burt Knack.  You will receive a reminder letter in the mail two months in advance. If you don't receive a letter, please call our office to schedule the follow-up appointment.

## 2014-03-20 ENCOUNTER — Encounter: Payer: Self-pay | Admitting: Cardiovascular Disease

## 2014-03-25 DEATH — deceased

## 2014-03-29 ENCOUNTER — Other Ambulatory Visit: Payer: Self-pay | Admitting: Hematology and Oncology

## 2014-04-11 ENCOUNTER — Other Ambulatory Visit: Payer: BC Managed Care – PPO

## 2014-04-16 ENCOUNTER — Ambulatory Visit: Payer: BC Managed Care – PPO

## 2014-04-16 ENCOUNTER — Ambulatory Visit: Payer: BC Managed Care – PPO | Admitting: Hematology and Oncology

## 2014-05-03 ENCOUNTER — Encounter (HOSPITAL_COMMUNITY): Payer: Self-pay | Admitting: Cardiovascular Disease
